# Patient Record
Sex: Male | Born: 1945 | Race: Black or African American | Hispanic: No | Marital: Married | Smoking: Former smoker
Health system: Southern US, Community
[De-identification: ages and names within clinical notes are randomized; demographics above are authoritative.]

## PROBLEM LIST (undated history)

## (undated) DIAGNOSIS — N189 Chronic kidney disease, unspecified: Secondary | ICD-10-CM

## (undated) DIAGNOSIS — I10 Essential (primary) hypertension: Secondary | ICD-10-CM

## (undated) DIAGNOSIS — B1920 Unspecified viral hepatitis C without hepatic coma: Secondary | ICD-10-CM

## (undated) DIAGNOSIS — R0602 Shortness of breath: Secondary | ICD-10-CM

## (undated) DIAGNOSIS — C9000 Multiple myeloma not having achieved remission: Secondary | ICD-10-CM

## (undated) DIAGNOSIS — T7840XA Allergy, unspecified, initial encounter: Secondary | ICD-10-CM

## (undated) HISTORY — PX: HAND SURGERY: SHX662

## (undated) HISTORY — DX: Unspecified viral hepatitis C without hepatic coma: B19.20

## (undated) HISTORY — DX: Essential (primary) hypertension: I10

## (undated) HISTORY — PX: CIRCUMCISION: SUR203

## (undated) HISTORY — PX: OTHER SURGICAL HISTORY: SHX169

## (undated) HISTORY — DX: Allergy, unspecified, initial encounter: T78.40XA

---

## 1999-04-14 ENCOUNTER — Encounter: Admission: RE | Admit: 1999-04-14 | Discharge: 1999-07-13 | Payer: Self-pay | Admitting: Family Medicine

## 1999-12-18 ENCOUNTER — Encounter (INDEPENDENT_AMBULATORY_CARE_PROVIDER_SITE_OTHER): Payer: Self-pay | Admitting: Specialist

## 1999-12-18 ENCOUNTER — Ambulatory Visit (HOSPITAL_COMMUNITY): Admission: RE | Admit: 1999-12-18 | Discharge: 1999-12-18 | Payer: Self-pay | Admitting: Gastroenterology

## 2002-06-01 ENCOUNTER — Encounter: Payer: Self-pay | Admitting: Urology

## 2002-06-03 ENCOUNTER — Encounter (INDEPENDENT_AMBULATORY_CARE_PROVIDER_SITE_OTHER): Payer: Self-pay | Admitting: Specialist

## 2002-06-03 ENCOUNTER — Ambulatory Visit (HOSPITAL_COMMUNITY): Admission: RE | Admit: 2002-06-03 | Discharge: 2002-06-03 | Payer: Self-pay | Admitting: Urology

## 2003-08-01 ENCOUNTER — Inpatient Hospital Stay (HOSPITAL_COMMUNITY): Admission: EM | Admit: 2003-08-01 | Discharge: 2003-08-06 | Payer: Self-pay | Admitting: Emergency Medicine

## 2006-08-31 ENCOUNTER — Ambulatory Visit: Payer: Self-pay | Admitting: Gastroenterology

## 2006-09-30 ENCOUNTER — Encounter (INDEPENDENT_AMBULATORY_CARE_PROVIDER_SITE_OTHER): Payer: Self-pay | Admitting: Physician Assistant

## 2006-09-30 ENCOUNTER — Ambulatory Visit (HOSPITAL_COMMUNITY): Admission: RE | Admit: 2006-09-30 | Discharge: 2006-09-30 | Payer: Self-pay | Admitting: Gastroenterology

## 2007-12-29 ENCOUNTER — Encounter: Admission: RE | Admit: 2007-12-29 | Discharge: 2007-12-29 | Payer: Self-pay | Admitting: Internal Medicine

## 2007-12-31 ENCOUNTER — Encounter: Admission: RE | Admit: 2007-12-31 | Discharge: 2007-12-31 | Payer: Self-pay | Admitting: Internal Medicine

## 2008-01-04 ENCOUNTER — Ambulatory Visit: Payer: Self-pay | Admitting: Internal Medicine

## 2008-01-11 LAB — CBC WITH DIFFERENTIAL/PLATELET
BASO%: 0.8 % (ref 0.0–2.0)
LYMPH%: 47 % (ref 14.0–48.0)
MCHC: 34.5 g/dL (ref 32.0–35.9)
MCV: 98.9 fL — ABNORMAL HIGH (ref 81.6–98.0)
MONO%: 12.6 % (ref 0.0–13.0)
Platelets: 223 10*3/uL (ref 145–400)
RBC: 3.86 10*6/uL — ABNORMAL LOW (ref 4.20–5.71)

## 2008-01-13 ENCOUNTER — Inpatient Hospital Stay (HOSPITAL_COMMUNITY): Admission: EM | Admit: 2008-01-13 | Discharge: 2008-01-18 | Payer: Self-pay | Admitting: Emergency Medicine

## 2008-01-13 LAB — IMMUNOFIXATION ELECTROPHORESIS
IgG (Immunoglobin G), Serum: 1180 mg/dL (ref 694–1618)
Total Protein, Serum Electrophoresis: 10.5 g/dL — ABNORMAL HIGH (ref 6.0–8.3)

## 2008-01-13 LAB — KAPPA/LAMBDA LIGHT CHAINS
Kappa:Lambda Ratio: 0.01 — ABNORMAL LOW (ref 0.26–1.65)
Lambda Free Lght Chn: 234 mg/dL — ABNORMAL HIGH (ref 0.57–2.63)

## 2008-01-13 LAB — IRON AND TIBC: Iron: 93 ug/dL (ref 42–165)

## 2008-01-13 LAB — FERRITIN: Ferritin: 1519 ng/mL — ABNORMAL HIGH (ref 22–322)

## 2008-01-14 ENCOUNTER — Ambulatory Visit: Payer: Self-pay | Admitting: Hematology and Oncology

## 2008-01-17 ENCOUNTER — Encounter: Payer: Self-pay | Admitting: Internal Medicine

## 2008-01-26 ENCOUNTER — Ambulatory Visit: Admission: RE | Admit: 2008-01-26 | Discharge: 2008-03-12 | Payer: Self-pay | Admitting: Radiation Oncology

## 2008-01-30 LAB — TECHNOLOGIST REVIEW

## 2008-01-30 LAB — CBC WITH DIFFERENTIAL/PLATELET
Basophils Absolute: 0 10*3/uL (ref 0.0–0.1)
EOS%: 0.2 % (ref 0.0–7.0)
HGB: 12.1 g/dL — ABNORMAL LOW (ref 13.0–17.1)
MCH: 34.3 pg — ABNORMAL HIGH (ref 28.0–33.4)
NEUT#: 5.8 10*3/uL (ref 1.5–6.5)
RBC: 3.54 10*6/uL — ABNORMAL LOW (ref 4.20–5.71)
RDW: 15.7 % — ABNORMAL HIGH (ref 11.2–14.6)
lymph#: 0.8 10*3/uL — ABNORMAL LOW (ref 0.9–3.3)

## 2008-02-03 ENCOUNTER — Emergency Department (HOSPITAL_COMMUNITY): Admission: EM | Admit: 2008-02-03 | Discharge: 2008-02-04 | Payer: Self-pay | Admitting: Emergency Medicine

## 2008-02-06 LAB — CBC WITH DIFFERENTIAL/PLATELET
BASO%: 0.7 % (ref 0.0–2.0)
Eosinophils Absolute: 0.1 10*3/uL (ref 0.0–0.5)
LYMPH%: 32.9 % (ref 14.0–48.0)
MCHC: 34.7 g/dL (ref 32.0–35.9)
MONO#: 0.1 10*3/uL (ref 0.1–0.9)
NEUT#: 1.3 10*3/uL — ABNORMAL LOW (ref 1.5–6.5)
Platelets: 88 10*3/uL — ABNORMAL LOW (ref 145–400)
RBC: 3.51 10*6/uL — ABNORMAL LOW (ref 4.20–5.71)
RDW: 15.7 % — ABNORMAL HIGH (ref 11.2–14.6)
WBC: 2.4 10*3/uL — ABNORMAL LOW (ref 4.0–10.0)
lymph#: 0.8 10*3/uL — ABNORMAL LOW (ref 0.9–3.3)

## 2008-02-06 LAB — COMPREHENSIVE METABOLIC PANEL
ALT: 121 U/L — ABNORMAL HIGH (ref 0–53)
Albumin: 2.1 g/dL — ABNORMAL LOW (ref 3.5–5.2)
CO2: 17 mEq/L — ABNORMAL LOW (ref 19–32)
Potassium: 4.1 mEq/L (ref 3.5–5.3)
Sodium: 136 mEq/L (ref 135–145)
Total Bilirubin: 0.6 mg/dL (ref 0.3–1.2)
Total Protein: 8.2 g/dL (ref 6.0–8.3)

## 2008-02-06 LAB — LACTATE DEHYDROGENASE: LDH: 130 U/L (ref 94–250)

## 2008-02-13 LAB — CBC WITH DIFFERENTIAL/PLATELET
Basophils Absolute: 0 10*3/uL (ref 0.0–0.1)
EOS%: 5.6 % (ref 0.0–7.0)
HCT: 36.4 % — ABNORMAL LOW (ref 38.7–49.9)
HGB: 12.6 g/dL — ABNORMAL LOW (ref 13.0–17.1)
MCH: 34.5 pg — ABNORMAL HIGH (ref 28.0–33.4)
MCHC: 34.7 g/dL (ref 32.0–35.9)
MCV: 99.3 fL — ABNORMAL HIGH (ref 81.6–98.0)
MONO%: 8.3 % (ref 0.0–13.0)
NEUT%: 27.9 % — ABNORMAL LOW (ref 40.0–75.0)
RDW: 15.6 % — ABNORMAL HIGH (ref 11.2–14.6)

## 2008-02-13 LAB — COMPREHENSIVE METABOLIC PANEL
AST: 27 U/L (ref 0–37)
Alkaline Phosphatase: 162 U/L — ABNORMAL HIGH (ref 39–117)
BUN: 16 mg/dL (ref 6–23)
Creatinine, Ser: 1.17 mg/dL (ref 0.40–1.50)

## 2008-02-15 ENCOUNTER — Ambulatory Visit: Payer: Self-pay | Admitting: Internal Medicine

## 2008-02-20 LAB — COMPREHENSIVE METABOLIC PANEL
ALT: 44 U/L (ref 0–53)
BUN: 17 mg/dL (ref 6–23)
CO2: 23 mEq/L (ref 19–32)
Calcium: 8.2 mg/dL — ABNORMAL LOW (ref 8.4–10.5)
Chloride: 102 mEq/L (ref 96–112)
Creatinine, Ser: 1.31 mg/dL (ref 0.40–1.50)
Glucose, Bld: 149 mg/dL — ABNORMAL HIGH (ref 70–99)

## 2008-02-20 LAB — CBC WITH DIFFERENTIAL/PLATELET
Basophils Absolute: 0 10*3/uL (ref 0.0–0.1)
HCT: 36.7 % — ABNORMAL LOW (ref 38.7–49.9)
HGB: 12.8 g/dL — ABNORMAL LOW (ref 13.0–17.1)
MONO#: 0.3 10*3/uL (ref 0.1–0.9)
NEUT%: 40.1 % (ref 40.0–75.0)
WBC: 2 10*3/uL — ABNORMAL LOW (ref 4.0–10.0)
lymph#: 0.9 10*3/uL (ref 0.9–3.3)

## 2008-02-20 LAB — LACTATE DEHYDROGENASE: LDH: 129 U/L (ref 94–250)

## 2008-02-23 LAB — COMPREHENSIVE METABOLIC PANEL
ALT: 37 U/L (ref 0–53)
AST: 28 U/L (ref 0–37)
Calcium: 8.4 mg/dL (ref 8.4–10.5)
Chloride: 102 mEq/L (ref 96–112)
Creatinine, Ser: 1.35 mg/dL (ref 0.40–1.50)
Potassium: 4.3 mEq/L (ref 3.5–5.3)

## 2008-02-23 LAB — URINALYSIS, MICROSCOPIC - CHCC
Nitrite: NEGATIVE
Protein: 2000 mg/dL
Specific Gravity, Urine: 1.03 (ref 1.003–1.035)

## 2008-02-25 LAB — URINE CULTURE

## 2008-03-01 ENCOUNTER — Ambulatory Visit (HOSPITAL_COMMUNITY): Admission: RE | Admit: 2008-03-01 | Discharge: 2008-03-01 | Payer: Self-pay | Admitting: Radiation Oncology

## 2008-03-08 LAB — CBC WITH DIFFERENTIAL/PLATELET
Basophils Absolute: 0 10*3/uL (ref 0.0–0.1)
Eosinophils Absolute: 0 10*3/uL (ref 0.0–0.5)
HGB: 11.7 g/dL — ABNORMAL LOW (ref 13.0–17.1)
MONO#: 0.4 10*3/uL (ref 0.1–0.9)
NEUT#: 2.9 10*3/uL (ref 1.5–6.5)
RDW: 16.6 % — ABNORMAL HIGH (ref 11.2–14.6)
WBC: 3.9 10*3/uL — ABNORMAL LOW (ref 4.0–10.0)
lymph#: 0.4 10*3/uL — ABNORMAL LOW (ref 0.9–3.3)

## 2008-03-08 LAB — COMPREHENSIVE METABOLIC PANEL
Albumin: 2.5 g/dL — ABNORMAL LOW (ref 3.5–5.2)
BUN: 24 mg/dL — ABNORMAL HIGH (ref 6–23)
Calcium: 8 mg/dL — ABNORMAL LOW (ref 8.4–10.5)
Chloride: 102 mEq/L (ref 96–112)
Glucose, Bld: 220 mg/dL — ABNORMAL HIGH (ref 70–99)
Potassium: 3.8 mEq/L (ref 3.5–5.3)
Total Protein: 7.6 g/dL (ref 6.0–8.3)

## 2008-03-26 LAB — CBC WITH DIFFERENTIAL/PLATELET
Basophils Absolute: 0 10*3/uL (ref 0.0–0.1)
EOS%: 1.4 % (ref 0.0–7.0)
Eosinophils Absolute: 0 10*3/uL (ref 0.0–0.5)
HGB: 12 g/dL — ABNORMAL LOW (ref 13.0–17.1)
MCH: 34 pg — ABNORMAL HIGH (ref 28.0–33.4)
MONO#: 0.3 10*3/uL (ref 0.1–0.9)
NEUT#: 0.8 10*3/uL — ABNORMAL LOW (ref 1.5–6.5)
RDW: 18 % — ABNORMAL HIGH (ref 11.2–14.6)
WBC: 1.8 10*3/uL — ABNORMAL LOW (ref 4.0–10.0)
lymph#: 0.7 10*3/uL — ABNORMAL LOW (ref 0.9–3.3)

## 2008-03-29 ENCOUNTER — Encounter: Admission: RE | Admit: 2008-03-29 | Discharge: 2008-03-29 | Payer: Self-pay | Admitting: Internal Medicine

## 2008-03-29 ENCOUNTER — Encounter: Payer: Self-pay | Admitting: Internal Medicine

## 2008-04-02 ENCOUNTER — Ambulatory Visit: Payer: Self-pay | Admitting: Internal Medicine

## 2008-04-02 LAB — CBC WITH DIFFERENTIAL/PLATELET
BASO%: 1.7 % (ref 0.0–2.0)
EOS%: 1.7 % (ref 0.0–7.0)
LYMPH%: 58.6 % — ABNORMAL HIGH (ref 14.0–48.0)
MCHC: 35.2 g/dL (ref 32.0–35.9)
MCV: 99.2 fL — ABNORMAL HIGH (ref 81.6–98.0)
MONO%: 7.9 % (ref 0.0–13.0)
NEUT#: 0.5 10*3/uL — ABNORMAL LOW (ref 1.5–6.5)
Platelets: 143 10*3/uL — ABNORMAL LOW (ref 145–400)
RBC: 3.66 10*6/uL — ABNORMAL LOW (ref 4.20–5.71)
RDW: 18.8 % — ABNORMAL HIGH (ref 11.2–14.6)

## 2008-04-02 LAB — COMPREHENSIVE METABOLIC PANEL
ALT: 86 U/L — ABNORMAL HIGH (ref 0–53)
AST: 56 U/L — ABNORMAL HIGH (ref 0–37)
Albumin: 2.6 g/dL — ABNORMAL LOW (ref 3.5–5.2)
Alkaline Phosphatase: 158 U/L — ABNORMAL HIGH (ref 39–117)
Potassium: 4.4 mEq/L (ref 3.5–5.3)
Sodium: 138 mEq/L (ref 135–145)
Total Bilirubin: 0.5 mg/dL (ref 0.3–1.2)
Total Protein: 8 g/dL (ref 6.0–8.3)

## 2008-04-05 ENCOUNTER — Encounter (INDEPENDENT_AMBULATORY_CARE_PROVIDER_SITE_OTHER): Payer: Self-pay | Admitting: Interventional Radiology

## 2008-04-05 ENCOUNTER — Ambulatory Visit (HOSPITAL_COMMUNITY): Admission: RE | Admit: 2008-04-05 | Discharge: 2008-04-05 | Payer: Self-pay | Admitting: Interventional Radiology

## 2008-04-09 LAB — CBC WITH DIFFERENTIAL/PLATELET
EOS%: 3.4 % (ref 0.0–7.0)
Eosinophils Absolute: 0 10*3/uL (ref 0.0–0.5)
LYMPH%: 44.7 % (ref 14.0–48.0)
MCH: 34.3 pg — ABNORMAL HIGH (ref 28.0–33.4)
MCHC: 34.6 g/dL (ref 32.0–35.9)
MCV: 99.3 fL — ABNORMAL HIGH (ref 81.6–98.0)
MONO%: 9 % (ref 0.0–13.0)
NEUT#: 0.6 10*3/uL — ABNORMAL LOW (ref 1.5–6.5)
Platelets: 174 10*3/uL (ref 145–400)
RBC: 3.45 10*6/uL — ABNORMAL LOW (ref 4.20–5.71)
RDW: 18.3 % — ABNORMAL HIGH (ref 11.2–14.6)

## 2008-04-09 LAB — COMPREHENSIVE METABOLIC PANEL
AST: 88 U/L — ABNORMAL HIGH (ref 0–37)
Alkaline Phosphatase: 176 U/L — ABNORMAL HIGH (ref 39–117)
BUN: 11 mg/dL (ref 6–23)
Glucose, Bld: 124 mg/dL — ABNORMAL HIGH (ref 70–99)
Sodium: 141 mEq/L (ref 135–145)
Total Bilirubin: 0.8 mg/dL (ref 0.3–1.2)
Total Protein: 7.6 g/dL (ref 6.0–8.3)

## 2008-04-16 LAB — CBC WITH DIFFERENTIAL/PLATELET
Basophils Absolute: 0 10*3/uL (ref 0.0–0.1)
Eosinophils Absolute: 0 10*3/uL (ref 0.0–0.5)
HGB: 11.8 g/dL — ABNORMAL LOW (ref 13.0–17.1)
LYMPH%: 50.9 % — ABNORMAL HIGH (ref 14.0–49.0)
MCH: 34.6 pg — ABNORMAL HIGH (ref 27.2–33.4)
MCV: 100.1 fL — ABNORMAL HIGH (ref 79.3–98.0)
MONO%: 9.8 % (ref 0.0–14.0)
NEUT#: 0.6 10*3/uL — ABNORMAL LOW (ref 1.5–6.5)
Platelets: 183 10*3/uL (ref 140–400)

## 2008-04-16 LAB — COMPREHENSIVE METABOLIC PANEL
Alkaline Phosphatase: 174 U/L — ABNORMAL HIGH (ref 39–117)
BUN: 16 mg/dL (ref 6–23)
Creatinine, Ser: 1.01 mg/dL (ref 0.40–1.50)
Glucose, Bld: 142 mg/dL — ABNORMAL HIGH (ref 70–99)
Total Bilirubin: 0.7 mg/dL (ref 0.3–1.2)

## 2008-04-23 LAB — CBC WITH DIFFERENTIAL/PLATELET
Eosinophils Absolute: 0 10*3/uL (ref 0.0–0.5)
HCT: 33.8 % — ABNORMAL LOW (ref 38.4–49.9)
LYMPH%: 37.1 % (ref 14.0–49.0)
MONO#: 0.3 10*3/uL (ref 0.1–0.9)
NEUT#: 0.7 10*3/uL — ABNORMAL LOW (ref 1.5–6.5)
NEUT%: 44.3 % (ref 39.0–75.0)
Platelets: 179 10*3/uL (ref 140–400)
WBC: 1.5 10*3/uL — ABNORMAL LOW (ref 4.0–10.3)

## 2008-04-23 LAB — COMPREHENSIVE METABOLIC PANEL
CO2: 26 mEq/L (ref 19–32)
Calcium: 8.7 mg/dL (ref 8.4–10.5)
Chloride: 104 mEq/L (ref 96–112)
Glucose, Bld: 99 mg/dL (ref 70–99)
Sodium: 139 mEq/L (ref 135–145)
Total Bilirubin: 0.9 mg/dL (ref 0.3–1.2)
Total Protein: 7.6 g/dL (ref 6.0–8.3)

## 2008-04-23 LAB — LACTATE DEHYDROGENASE: LDH: 191 U/L (ref 94–250)

## 2008-04-30 LAB — CBC WITH DIFFERENTIAL/PLATELET
BASO%: 1.1 % (ref 0.0–2.0)
EOS%: 0.5 % (ref 0.0–7.0)
HCT: 36.7 % — ABNORMAL LOW (ref 38.4–49.9)
MCHC: 33.2 g/dL (ref 32.0–36.0)
MONO#: 0.4 10*3/uL (ref 0.1–0.9)
NEUT%: 43.8 % (ref 39.0–75.0)
RBC: 3.71 10*6/uL — ABNORMAL LOW (ref 4.20–5.82)
RDW: 17.7 % — ABNORMAL HIGH (ref 11.0–14.6)
WBC: 1.9 10*3/uL — ABNORMAL LOW (ref 4.0–10.3)
lymph#: 0.6 10*3/uL — ABNORMAL LOW (ref 0.9–3.3)
nRBC: 0 % (ref 0–0)

## 2008-04-30 LAB — COMPREHENSIVE METABOLIC PANEL
ALT: 74 U/L — ABNORMAL HIGH (ref 0–53)
AST: 50 U/L — ABNORMAL HIGH (ref 0–37)
Albumin: 2.5 g/dL — ABNORMAL LOW (ref 3.5–5.2)
Alkaline Phosphatase: 131 U/L — ABNORMAL HIGH (ref 39–117)
Calcium: 7.3 mg/dL — ABNORMAL LOW (ref 8.4–10.5)
Chloride: 104 mEq/L (ref 96–112)
Potassium: 4.1 mEq/L (ref 3.5–5.3)
Sodium: 137 mEq/L (ref 135–145)
Total Protein: 7 g/dL (ref 6.0–8.3)

## 2008-05-07 LAB — CBC WITH DIFFERENTIAL/PLATELET
BASO%: 0.7 % (ref 0.0–2.0)
EOS%: 1.2 % (ref 0.0–7.0)
Eosinophils Absolute: 0.1 10*3/uL (ref 0.0–0.5)
LYMPH%: 16.9 % (ref 14.0–49.0)
MCH: 34.9 pg — ABNORMAL HIGH (ref 27.2–33.4)
MCHC: 34.3 g/dL (ref 32.0–36.0)
MCV: 101.7 fL — ABNORMAL HIGH (ref 79.3–98.0)
MONO%: 1.9 % (ref 0.0–14.0)
NEUT#: 4.1 10*3/uL (ref 1.5–6.5)
Platelets: 165 10*3/uL (ref 140–400)
RBC: 3.68 10*6/uL — ABNORMAL LOW (ref 4.20–5.82)

## 2008-05-07 LAB — COMPREHENSIVE METABOLIC PANEL
ALT: 89 U/L — ABNORMAL HIGH (ref 0–53)
Alkaline Phosphatase: 167 U/L — ABNORMAL HIGH (ref 39–117)
BUN: 12 mg/dL (ref 6–23)
Glucose, Bld: 145 mg/dL — ABNORMAL HIGH (ref 70–99)
Sodium: 140 mEq/L (ref 135–145)
Total Bilirubin: 0.7 mg/dL (ref 0.3–1.2)
Total Protein: 7.4 g/dL (ref 6.0–8.3)

## 2008-05-18 ENCOUNTER — Ambulatory Visit: Payer: Self-pay | Admitting: Internal Medicine

## 2008-05-23 LAB — CBC WITH DIFFERENTIAL/PLATELET
BASO%: 0.7 % (ref 0.0–2.0)
Eosinophils Absolute: 0 10*3/uL (ref 0.0–0.5)
HCT: 38.3 % — ABNORMAL LOW (ref 38.4–49.9)
LYMPH%: 40 % (ref 14.0–49.0)
MCHC: 34 g/dL (ref 32.0–36.0)
MONO#: 0.1 10*3/uL (ref 0.1–0.9)
NEUT%: 53.9 % (ref 39.0–75.0)
Platelets: 86 10*3/uL — ABNORMAL LOW (ref 140–400)
WBC: 1.2 10*3/uL — ABNORMAL LOW (ref 4.0–10.3)

## 2008-05-24 LAB — COMPREHENSIVE METABOLIC PANEL
BUN: 26 mg/dL — ABNORMAL HIGH (ref 6–23)
CO2: 19 mEq/L (ref 19–32)
Creatinine, Ser: 1.19 mg/dL (ref 0.40–1.50)
Glucose, Bld: 219 mg/dL — ABNORMAL HIGH (ref 70–99)
Total Bilirubin: 0.6 mg/dL (ref 0.3–1.2)

## 2008-05-24 LAB — KAPPA/LAMBDA LIGHT CHAINS
Kappa:Lambda Ratio: 0.04 — ABNORMAL LOW (ref 0.26–1.65)
Lambda Free Lght Chn: 49.7 mg/dL — ABNORMAL HIGH (ref 0.57–2.63)

## 2008-05-28 LAB — CBC WITH DIFFERENTIAL/PLATELET
Basophils Absolute: 0 10*3/uL (ref 0.0–0.1)
Eosinophils Absolute: 0 10*3/uL (ref 0.0–0.5)
HCT: 32.6 % — ABNORMAL LOW (ref 38.4–49.9)
HGB: 11.4 g/dL — ABNORMAL LOW (ref 13.0–17.1)
LYMPH%: 64.6 % — ABNORMAL HIGH (ref 14.0–49.0)
MCHC: 35 g/dL (ref 32.0–36.0)
MONO#: 0.3 10*3/uL (ref 0.1–0.9)
NEUT#: 0.1 10*3/uL — CL (ref 1.5–6.5)
NEUT%: 9.7 % — ABNORMAL LOW (ref 39.0–75.0)
Platelets: 63 10*3/uL — ABNORMAL LOW (ref 140–400)
WBC: 1.1 10*3/uL — ABNORMAL LOW (ref 4.0–10.3)

## 2008-05-28 LAB — COMPREHENSIVE METABOLIC PANEL
Albumin: 2.4 g/dL — ABNORMAL LOW (ref 3.5–5.2)
Alkaline Phosphatase: 121 U/L — ABNORMAL HIGH (ref 39–117)
BUN: 16 mg/dL (ref 6–23)
CO2: 23 mEq/L (ref 19–32)
Glucose, Bld: 131 mg/dL — ABNORMAL HIGH (ref 70–99)
Potassium: 4 mEq/L (ref 3.5–5.3)
Sodium: 137 mEq/L (ref 135–145)
Total Bilirubin: 0.6 mg/dL (ref 0.3–1.2)
Total Protein: 7.4 g/dL (ref 6.0–8.3)

## 2008-05-28 LAB — LACTATE DEHYDROGENASE: LDH: 138 U/L (ref 94–250)

## 2008-06-04 LAB — CBC WITH DIFFERENTIAL/PLATELET
BASO%: 0.7 % (ref 0.0–2.0)
Eosinophils Absolute: 0 10*3/uL (ref 0.0–0.5)
MCHC: 34.2 g/dL (ref 32.0–36.0)
MONO#: 0.3 10*3/uL (ref 0.1–0.9)
NEUT#: 3 10*3/uL (ref 1.5–6.5)
Platelets: 91 10*3/uL — ABNORMAL LOW (ref 140–400)
RBC: 3.39 10*6/uL — ABNORMAL LOW (ref 4.20–5.82)
RDW: 17.6 % — ABNORMAL HIGH (ref 11.0–14.6)
WBC: 4.7 10*3/uL (ref 4.0–10.3)
lymph#: 1.3 10*3/uL (ref 0.9–3.3)

## 2008-06-04 LAB — COMPREHENSIVE METABOLIC PANEL
ALT: 71 U/L — ABNORMAL HIGH (ref 0–53)
Albumin: 2.5 g/dL — ABNORMAL LOW (ref 3.5–5.2)
CO2: 23 mEq/L (ref 19–32)
Calcium: 8.3 mg/dL — ABNORMAL LOW (ref 8.4–10.5)
Chloride: 110 mEq/L (ref 96–112)
Glucose, Bld: 76 mg/dL (ref 70–99)
Potassium: 4 mEq/L (ref 3.5–5.3)
Sodium: 139 mEq/L (ref 135–145)
Total Bilirubin: 0.5 mg/dL (ref 0.3–1.2)
Total Protein: 8 g/dL (ref 6.0–8.3)

## 2008-06-04 LAB — LACTATE DEHYDROGENASE: LDH: 141 U/L (ref 94–250)

## 2008-06-11 LAB — CBC WITH DIFFERENTIAL/PLATELET
BASO%: 0 % (ref 0.0–2.0)
HCT: 33.4 % — ABNORMAL LOW (ref 38.4–49.9)
LYMPH%: 42.4 % (ref 14.0–49.0)
MCH: 33.9 pg — ABNORMAL HIGH (ref 27.2–33.4)
MCHC: 34.7 g/dL (ref 32.0–36.0)
MCV: 97.7 fL (ref 79.3–98.0)
MONO#: 0.2 10*3/uL (ref 0.1–0.9)
MONO%: 10.4 % (ref 0.0–14.0)
NEUT%: 44.4 % (ref 39.0–75.0)
Platelets: 101 10*3/uL — ABNORMAL LOW (ref 140–400)
WBC: 1.4 10*3/uL — ABNORMAL LOW (ref 4.0–10.3)

## 2008-06-11 LAB — COMPREHENSIVE METABOLIC PANEL
AST: 83 U/L — ABNORMAL HIGH (ref 0–37)
BUN: 11 mg/dL (ref 6–23)
Calcium: 8.2 mg/dL — ABNORMAL LOW (ref 8.4–10.5)
Chloride: 104 mEq/L (ref 96–112)
Creatinine, Ser: 0.89 mg/dL (ref 0.40–1.50)

## 2008-06-11 LAB — LACTATE DEHYDROGENASE: LDH: 166 U/L (ref 94–250)

## 2008-06-18 LAB — COMPREHENSIVE METABOLIC PANEL
Albumin: 3.1 g/dL — ABNORMAL LOW (ref 3.5–5.2)
BUN: 12 mg/dL (ref 6–23)
CO2: 23 mEq/L (ref 19–32)
Calcium: 8.1 mg/dL — ABNORMAL LOW (ref 8.4–10.5)
Chloride: 105 mEq/L (ref 96–112)
Glucose, Bld: 72 mg/dL (ref 70–99)
Potassium: 3.5 mEq/L (ref 3.5–5.3)

## 2008-06-18 LAB — CBC WITH DIFFERENTIAL/PLATELET
Basophils Absolute: 0 10*3/uL (ref 0.0–0.1)
Eosinophils Absolute: 0 10*3/uL (ref 0.0–0.5)
HGB: 12.5 g/dL — ABNORMAL LOW (ref 13.0–17.1)
MCV: 101.3 fL — ABNORMAL HIGH (ref 79.3–98.0)
NEUT#: 0.4 10*3/uL — CL (ref 1.5–6.5)
RDW: 16.4 % — ABNORMAL HIGH (ref 11.0–14.6)
lymph#: 0.8 10*3/uL — ABNORMAL LOW (ref 0.9–3.3)

## 2008-07-02 ENCOUNTER — Ambulatory Visit: Payer: Self-pay | Admitting: Internal Medicine

## 2008-07-02 LAB — BASIC METABOLIC PANEL
BUN: 12 mg/dL (ref 6–23)
CO2: 22 mEq/L (ref 19–32)
Calcium: 8.1 mg/dL — ABNORMAL LOW (ref 8.4–10.5)
Creatinine, Ser: 0.96 mg/dL (ref 0.40–1.50)
Glucose, Bld: 103 mg/dL — ABNORMAL HIGH (ref 70–99)

## 2008-07-09 LAB — COMPREHENSIVE METABOLIC PANEL
AST: 59 U/L — ABNORMAL HIGH (ref 0–37)
Albumin: 3 g/dL — ABNORMAL LOW (ref 3.5–5.2)
Alkaline Phosphatase: 124 U/L — ABNORMAL HIGH (ref 39–117)
Potassium: 3.9 mEq/L (ref 3.5–5.3)
Sodium: 140 mEq/L (ref 135–145)
Total Protein: 7.7 g/dL (ref 6.0–8.3)

## 2008-07-09 LAB — CBC WITH DIFFERENTIAL/PLATELET
BASO%: 1.1 % (ref 0.0–2.0)
EOS%: 5.1 % (ref 0.0–7.0)
MCH: 34.8 pg — ABNORMAL HIGH (ref 27.2–33.4)
MCV: 102.3 fL — ABNORMAL HIGH (ref 79.3–98.0)
MONO%: 13.1 % (ref 0.0–14.0)
NEUT#: 0.9 10*3/uL — ABNORMAL LOW (ref 1.5–6.5)
RBC: 3.72 10*6/uL — ABNORMAL LOW (ref 4.20–5.82)
RDW: 17.4 % — ABNORMAL HIGH (ref 11.0–14.6)

## 2008-07-16 LAB — CBC WITH DIFFERENTIAL/PLATELET
Basophils Absolute: 0 10*3/uL (ref 0.0–0.1)
EOS%: 8.5 % — ABNORMAL HIGH (ref 0.0–7.0)
MCH: 34 pg — ABNORMAL HIGH (ref 27.2–33.4)
MCV: 98.3 fL — ABNORMAL HIGH (ref 79.3–98.0)
MONO%: 18.6 % — ABNORMAL HIGH (ref 0.0–14.0)
RBC: 3.5 10*6/uL — ABNORMAL LOW (ref 4.20–5.82)
RDW: 15.5 % — ABNORMAL HIGH (ref 11.0–14.6)

## 2008-07-16 LAB — COMPREHENSIVE METABOLIC PANEL
Alkaline Phosphatase: 121 U/L — ABNORMAL HIGH (ref 39–117)
Creatinine, Ser: 0.9 mg/dL (ref 0.40–1.50)
Glucose, Bld: 104 mg/dL — ABNORMAL HIGH (ref 70–99)
Sodium: 139 mEq/L (ref 135–145)
Total Bilirubin: 0.7 mg/dL (ref 0.3–1.2)
Total Protein: 7.1 g/dL (ref 6.0–8.3)

## 2008-07-27 ENCOUNTER — Ambulatory Visit: Payer: Self-pay | Admitting: Internal Medicine

## 2008-07-27 ENCOUNTER — Encounter: Payer: Self-pay | Admitting: Internal Medicine

## 2008-07-27 ENCOUNTER — Other Ambulatory Visit: Admission: RE | Admit: 2008-07-27 | Discharge: 2008-07-27 | Payer: Self-pay | Admitting: Internal Medicine

## 2008-07-27 LAB — COMPREHENSIVE METABOLIC PANEL
ALT: 64 U/L — ABNORMAL HIGH (ref 0–53)
BUN: 18 mg/dL (ref 6–23)
CO2: 21 mEq/L (ref 19–32)
Calcium: 7.8 mg/dL — ABNORMAL LOW (ref 8.4–10.5)
Creatinine, Ser: 1.06 mg/dL (ref 0.40–1.50)
Total Bilirubin: 0.5 mg/dL (ref 0.3–1.2)

## 2008-07-27 LAB — CBC WITH DIFFERENTIAL/PLATELET
BASO%: 0.7 % (ref 0.0–2.0)
Basophils Absolute: 0 10*3/uL (ref 0.0–0.1)
HCT: 35.2 % — ABNORMAL LOW (ref 38.4–49.9)
HGB: 12 g/dL — ABNORMAL LOW (ref 13.0–17.1)
LYMPH%: 48.7 % (ref 14.0–49.0)
MCH: 33.7 pg — ABNORMAL HIGH (ref 27.2–33.4)
MCHC: 34.1 g/dL (ref 32.0–36.0)
MONO#: 0.3 10*3/uL (ref 0.1–0.9)
NEUT%: 25.3 % — ABNORMAL LOW (ref 39.0–75.0)
Platelets: 116 10*3/uL — ABNORMAL LOW (ref 140–400)

## 2008-07-30 LAB — IGG, IGA, IGM
IgA: 1610 mg/dL — ABNORMAL HIGH (ref 68–378)
IgG (Immunoglobin G), Serum: 935 mg/dL (ref 694–1618)
IgM, Serum: 27 mg/dL — ABNORMAL LOW (ref 60–263)

## 2008-07-30 LAB — BETA 2 MICROGLOBULIN, SERUM: Beta-2 Microglobulin: 3.24 mg/L — ABNORMAL HIGH (ref 1.01–1.73)

## 2008-08-02 LAB — CBC WITH DIFFERENTIAL/PLATELET
Basophils Absolute: 0 10*3/uL (ref 0.0–0.1)
Eosinophils Absolute: 0 10*3/uL (ref 0.0–0.5)
HCT: 32.8 % — ABNORMAL LOW (ref 38.4–49.9)
HGB: 11.3 g/dL — ABNORMAL LOW (ref 13.0–17.1)
LYMPH%: 17.9 % (ref 14.0–49.0)
MCHC: 34.5 g/dL (ref 32.0–36.0)
MONO#: 0.5 10*3/uL (ref 0.1–0.9)
NEUT#: 3.8 10*3/uL (ref 1.5–6.5)
NEUT%: 72.5 % (ref 39.0–75.0)
Platelets: 107 10*3/uL — ABNORMAL LOW (ref 140–400)
WBC: 5.2 10*3/uL (ref 4.0–10.3)
lymph#: 0.9 10*3/uL (ref 0.9–3.3)

## 2008-08-02 LAB — COMPREHENSIVE METABOLIC PANEL
ALT: 59 U/L — ABNORMAL HIGH (ref 0–53)
AST: 55 U/L — ABNORMAL HIGH (ref 0–37)
Alkaline Phosphatase: 116 U/L (ref 39–117)
Glucose, Bld: 142 mg/dL — ABNORMAL HIGH (ref 70–99)
Potassium: 3.8 mEq/L (ref 3.5–5.3)
Sodium: 141 mEq/L (ref 135–145)
Total Bilirubin: 0.5 mg/dL (ref 0.3–1.2)
Total Protein: 7 g/dL (ref 6.0–8.3)

## 2008-08-09 LAB — COMPREHENSIVE METABOLIC PANEL
AST: 56 U/L — ABNORMAL HIGH (ref 0–37)
Alkaline Phosphatase: 113 U/L (ref 39–117)
BUN: 21 mg/dL (ref 6–23)
Glucose, Bld: 81 mg/dL (ref 70–99)
Sodium: 140 mEq/L (ref 135–145)
Total Bilirubin: 0.8 mg/dL (ref 0.3–1.2)
Total Protein: 7 g/dL (ref 6.0–8.3)

## 2008-08-09 LAB — CBC WITH DIFFERENTIAL/PLATELET
EOS%: 2.4 % (ref 0.0–7.0)
Eosinophils Absolute: 0 10*3/uL (ref 0.0–0.5)
LYMPH%: 52.1 % — ABNORMAL HIGH (ref 14.0–49.0)
MCH: 33.6 pg — ABNORMAL HIGH (ref 27.2–33.4)
MCV: 98.6 fL — ABNORMAL HIGH (ref 79.3–98.0)
MONO%: 4.2 % (ref 0.0–14.0)
NEUT#: 0.7 10*3/uL — ABNORMAL LOW (ref 1.5–6.5)
Platelets: 78 10*3/uL — ABNORMAL LOW (ref 140–400)
RBC: 3.54 10*6/uL — ABNORMAL LOW (ref 4.20–5.82)

## 2008-08-14 ENCOUNTER — Encounter: Payer: Self-pay | Admitting: Internal Medicine

## 2008-08-14 ENCOUNTER — Ambulatory Visit: Admission: RE | Admit: 2008-08-14 | Discharge: 2008-08-14 | Payer: Self-pay | Admitting: Internal Medicine

## 2008-08-16 LAB — CBC WITH DIFFERENTIAL/PLATELET
Basophils Absolute: 0 10*3/uL (ref 0.0–0.1)
Eosinophils Absolute: 0.1 10*3/uL (ref 0.0–0.5)
HGB: 12.2 g/dL — ABNORMAL LOW (ref 13.0–17.1)
LYMPH%: 42.9 % (ref 14.0–49.0)
MCV: 99.4 fL — ABNORMAL HIGH (ref 79.3–98.0)
MONO%: 16.2 % — ABNORMAL HIGH (ref 0.0–14.0)
NEUT#: 0.5 10*3/uL — ABNORMAL LOW (ref 1.5–6.5)
Platelets: 42 10*3/uL — ABNORMAL LOW (ref 140–400)

## 2008-08-28 ENCOUNTER — Ambulatory Visit: Payer: Self-pay | Admitting: Internal Medicine

## 2008-08-28 LAB — CBC WITH DIFFERENTIAL/PLATELET
BASO%: 0.4 % (ref 0.0–2.0)
Basophils Absolute: 0 10*3/uL (ref 0.0–0.1)
Eosinophils Absolute: 0 10*3/uL (ref 0.0–0.5)
HCT: 32.5 % — ABNORMAL LOW (ref 38.4–49.9)
HGB: 11.4 g/dL — ABNORMAL LOW (ref 13.0–17.1)
MONO#: 0.2 10*3/uL (ref 0.1–0.9)
NEUT#: 1.4 10*3/uL — ABNORMAL LOW (ref 1.5–6.5)
NEUT%: 57.6 % (ref 39.0–75.0)
WBC: 2.5 10*3/uL — ABNORMAL LOW (ref 4.0–10.3)
lymph#: 0.8 10*3/uL — ABNORMAL LOW (ref 0.9–3.3)

## 2008-08-28 LAB — LACTATE DEHYDROGENASE: LDH: 192 U/L (ref 94–250)

## 2008-08-28 LAB — COMPREHENSIVE METABOLIC PANEL
ALT: 58 U/L — ABNORMAL HIGH (ref 0–53)
BUN: 14 mg/dL (ref 6–23)
CO2: 27 mEq/L (ref 19–32)
Calcium: 8.6 mg/dL (ref 8.4–10.5)
Chloride: 107 mEq/L (ref 96–112)
Creatinine, Ser: 1.15 mg/dL (ref 0.40–1.50)

## 2008-09-03 LAB — CBC WITH DIFFERENTIAL/PLATELET
Basophils Absolute: 0 10*3/uL (ref 0.0–0.1)
Eosinophils Absolute: 0 10*3/uL (ref 0.0–0.5)
HGB: 12 g/dL — ABNORMAL LOW (ref 13.0–17.1)
MCV: 97.2 fL (ref 79.3–98.0)
MONO#: 0.3 10*3/uL (ref 0.1–0.9)
NEUT#: 0.5 10*3/uL — ABNORMAL LOW (ref 1.5–6.5)
RDW: 15.2 % — ABNORMAL HIGH (ref 11.0–14.6)
WBC: 1.7 10*3/uL — ABNORMAL LOW (ref 4.0–10.3)
lymph#: 0.8 10*3/uL — ABNORMAL LOW (ref 0.9–3.3)

## 2008-09-04 LAB — COMPREHENSIVE METABOLIC PANEL
ALT: 60 U/L — ABNORMAL HIGH (ref 0–53)
AST: 50 U/L — ABNORMAL HIGH (ref 0–37)
Calcium: 8.5 mg/dL (ref 8.4–10.5)
Chloride: 105 mEq/L (ref 96–112)
Creatinine, Ser: 1.04 mg/dL (ref 0.40–1.50)
Total Bilirubin: 0.5 mg/dL (ref 0.3–1.2)

## 2008-09-10 LAB — COMPREHENSIVE METABOLIC PANEL
Albumin: 2.9 g/dL — ABNORMAL LOW (ref 3.5–5.2)
Alkaline Phosphatase: 139 U/L — ABNORMAL HIGH (ref 39–117)
BUN: 17 mg/dL (ref 6–23)
CO2: 22 mEq/L (ref 19–32)
Glucose, Bld: 228 mg/dL — ABNORMAL HIGH (ref 70–99)
Potassium: 3.6 mEq/L (ref 3.5–5.3)
Total Bilirubin: 0.5 mg/dL (ref 0.3–1.2)

## 2008-09-10 LAB — CBC WITH DIFFERENTIAL/PLATELET
Basophils Absolute: 0 10*3/uL (ref 0.0–0.1)
Eosinophils Absolute: 0 10*3/uL (ref 0.0–0.5)
HCT: 31.7 % — ABNORMAL LOW (ref 38.4–49.9)
HGB: 10.7 g/dL — ABNORMAL LOW (ref 13.0–17.1)
LYMPH%: 29 % (ref 14.0–49.0)
MCV: 100 fL — ABNORMAL HIGH (ref 79.3–98.0)
MONO#: 0.9 10*3/uL (ref 0.1–0.9)
MONO%: 19.6 % — ABNORMAL HIGH (ref 0.0–14.0)
NEUT#: 2.3 10*3/uL (ref 1.5–6.5)
Platelets: 125 10*3/uL — ABNORMAL LOW (ref 140–400)

## 2008-09-17 LAB — CBC WITH DIFFERENTIAL/PLATELET
Basophils Absolute: 0 10*3/uL (ref 0.0–0.1)
Eosinophils Absolute: 0 10*3/uL (ref 0.0–0.5)
HGB: 11.8 g/dL — ABNORMAL LOW (ref 13.0–17.1)
MCV: 99.1 fL — ABNORMAL HIGH (ref 79.3–98.0)
MONO#: 0.3 10*3/uL (ref 0.1–0.9)
MONO%: 18.7 % — ABNORMAL HIGH (ref 0.0–14.0)
NEUT#: 0.5 10*3/uL — ABNORMAL LOW (ref 1.5–6.5)
RBC: 3.51 10*6/uL — ABNORMAL LOW (ref 4.20–5.82)
RDW: 16.2 % — ABNORMAL HIGH (ref 11.0–14.6)
WBC: 1.5 10*3/uL — ABNORMAL LOW (ref 4.0–10.3)

## 2008-09-24 ENCOUNTER — Ambulatory Visit: Payer: Self-pay | Admitting: Internal Medicine

## 2008-09-24 LAB — COMPREHENSIVE METABOLIC PANEL
ALT: 103 U/L — ABNORMAL HIGH (ref 0–53)
Albumin: 2.8 g/dL — ABNORMAL LOW (ref 3.5–5.2)
CO2: 25 mEq/L (ref 19–32)
Calcium: 8.9 mg/dL (ref 8.4–10.5)
Chloride: 110 mEq/L (ref 96–112)
Glucose, Bld: 156 mg/dL — ABNORMAL HIGH (ref 70–99)
Sodium: 141 mEq/L (ref 135–145)
Total Protein: 7.5 g/dL (ref 6.0–8.3)

## 2008-09-24 LAB — CBC WITH DIFFERENTIAL/PLATELET
BASO%: 0.1 % (ref 0.0–2.0)
Eosinophils Absolute: 0 10*3/uL (ref 0.0–0.5)
HCT: 34.9 % — ABNORMAL LOW (ref 38.4–49.9)
LYMPH%: 9 % — ABNORMAL LOW (ref 14.0–49.0)
MCHC: 33.5 g/dL (ref 32.0–36.0)
MONO#: 1.5 10*3/uL — ABNORMAL HIGH (ref 0.1–0.9)
NEUT#: 9.2 10*3/uL — ABNORMAL HIGH (ref 1.5–6.5)
Platelets: 169 10*3/uL (ref 140–400)
RBC: 3.44 10*6/uL — ABNORMAL LOW (ref 4.20–5.82)
WBC: 11.7 10*3/uL — ABNORMAL HIGH (ref 4.0–10.3)
lymph#: 1.1 10*3/uL (ref 0.9–3.3)

## 2008-10-01 LAB — CBC WITH DIFFERENTIAL/PLATELET
BASO%: 0 % (ref 0.0–2.0)
Eosinophils Absolute: 0 10*3/uL (ref 0.0–0.5)
LYMPH%: 25.1 % (ref 14.0–49.0)
MCHC: 33.6 g/dL (ref 32.0–36.0)
MONO#: 0.5 10*3/uL (ref 0.1–0.9)
NEUT#: 1.7 10*3/uL (ref 1.5–6.5)
RBC: 3.44 10*6/uL — ABNORMAL LOW (ref 4.20–5.82)
RDW: 15.5 % — ABNORMAL HIGH (ref 11.0–14.6)
WBC: 2.9 10*3/uL — ABNORMAL LOW (ref 4.0–10.3)
lymph#: 0.7 10*3/uL — ABNORMAL LOW (ref 0.9–3.3)

## 2008-10-01 LAB — COMPREHENSIVE METABOLIC PANEL
ALT: 106 U/L — ABNORMAL HIGH (ref 0–53)
Albumin: 3.3 g/dL — ABNORMAL LOW (ref 3.5–5.2)
CO2: 21 mEq/L (ref 19–32)
Chloride: 109 mEq/L (ref 96–112)
Glucose, Bld: 89 mg/dL (ref 70–99)
Potassium: 3.8 mEq/L (ref 3.5–5.3)
Sodium: 142 mEq/L (ref 135–145)
Total Bilirubin: 0.7 mg/dL (ref 0.3–1.2)
Total Protein: 6.8 g/dL (ref 6.0–8.3)

## 2008-10-01 LAB — LACTATE DEHYDROGENASE: LDH: 229 U/L (ref 94–250)

## 2008-10-08 LAB — COMPREHENSIVE METABOLIC PANEL
ALT: 94 U/L — ABNORMAL HIGH (ref 0–53)
AST: 79 U/L — ABNORMAL HIGH (ref 0–37)
Creatinine, Ser: 0.96 mg/dL (ref 0.40–1.50)
Sodium: 142 mEq/L (ref 135–145)
Total Bilirubin: 0.4 mg/dL (ref 0.3–1.2)

## 2008-10-08 LAB — CBC WITH DIFFERENTIAL/PLATELET
BASO%: 0.1 % (ref 0.0–2.0)
EOS%: 0.2 % (ref 0.0–7.0)
HCT: 37.1 % — ABNORMAL LOW (ref 38.4–49.9)
LYMPH%: 7.4 % — ABNORMAL LOW (ref 14.0–49.0)
MCH: 34.1 pg — ABNORMAL HIGH (ref 27.2–33.4)
MCHC: 34 g/dL (ref 32.0–36.0)
NEUT%: 84.5 % — ABNORMAL HIGH (ref 39.0–75.0)
RBC: 3.7 10*6/uL — ABNORMAL LOW (ref 4.20–5.82)
lymph#: 1 10*3/uL (ref 0.9–3.3)

## 2008-10-15 LAB — CBC WITH DIFFERENTIAL/PLATELET
Eosinophils Absolute: 0 10*3/uL (ref 0.0–0.5)
HCT: 38.6 % (ref 38.4–49.9)
LYMPH%: 28.4 % (ref 14.0–49.0)
MCHC: 33.9 g/dL (ref 32.0–36.0)
MCV: 99.2 fL — ABNORMAL HIGH (ref 79.3–98.0)
MONO#: 0.3 10*3/uL (ref 0.1–0.9)
MONO%: 14.4 % — ABNORMAL HIGH (ref 0.0–14.0)
NEUT#: 1.2 10*3/uL — ABNORMAL LOW (ref 1.5–6.5)
NEUT%: 55.3 % (ref 39.0–75.0)
Platelets: 141 10*3/uL (ref 140–400)
WBC: 2.1 10*3/uL — ABNORMAL LOW (ref 4.0–10.3)

## 2008-10-15 LAB — COMPREHENSIVE METABOLIC PANEL
AST: 55 U/L — ABNORMAL HIGH (ref 0–37)
Albumin: 3.3 g/dL — ABNORMAL LOW (ref 3.5–5.2)
Alkaline Phosphatase: 152 U/L — ABNORMAL HIGH (ref 39–117)
Glucose, Bld: 254 mg/dL — ABNORMAL HIGH (ref 70–99)
Potassium: 3.9 mEq/L (ref 3.5–5.3)
Sodium: 141 mEq/L (ref 135–145)
Total Bilirubin: 0.6 mg/dL (ref 0.3–1.2)
Total Protein: 6.7 g/dL (ref 6.0–8.3)

## 2008-10-22 LAB — COMPREHENSIVE METABOLIC PANEL
Alkaline Phosphatase: 164 U/L — ABNORMAL HIGH (ref 39–117)
CO2: 21 mEq/L (ref 19–32)
Creatinine, Ser: 0.91 mg/dL (ref 0.40–1.50)
Glucose, Bld: 239 mg/dL — ABNORMAL HIGH (ref 70–99)
Total Bilirubin: 0.4 mg/dL (ref 0.3–1.2)

## 2008-10-22 LAB — CBC WITH DIFFERENTIAL/PLATELET
BASO%: 0.2 % (ref 0.0–2.0)
MCHC: 34 g/dL (ref 32.0–36.0)
MONO#: 1.3 10*3/uL — ABNORMAL HIGH (ref 0.1–0.9)
RBC: 3.86 10*6/uL — ABNORMAL LOW (ref 4.20–5.82)
WBC: 13.2 10*3/uL — ABNORMAL HIGH (ref 4.0–10.3)
lymph#: 1.1 10*3/uL (ref 0.9–3.3)

## 2008-10-22 LAB — LACTATE DEHYDROGENASE: LDH: 217 U/L (ref 94–250)

## 2008-10-23 ENCOUNTER — Encounter: Admission: RE | Admit: 2008-10-23 | Discharge: 2008-10-23 | Payer: Self-pay | Admitting: Internal Medicine

## 2008-10-25 ENCOUNTER — Ambulatory Visit: Payer: Self-pay | Admitting: Internal Medicine

## 2008-10-30 LAB — CBC WITH DIFFERENTIAL/PLATELET
Eosinophils Absolute: 0 10*3/uL (ref 0.0–0.5)
HCT: 39.9 % (ref 38.4–49.9)
LYMPH%: 29.1 % (ref 14.0–49.0)
MCHC: 33.3 g/dL (ref 32.0–36.0)
MONO#: 0.3 10*3/uL (ref 0.1–0.9)
NEUT#: 1.1 10*3/uL — ABNORMAL LOW (ref 1.5–6.5)
NEUT%: 56.3 % (ref 39.0–75.0)
Platelets: 134 10*3/uL — ABNORMAL LOW (ref 140–400)
WBC: 2 10*3/uL — ABNORMAL LOW (ref 4.0–10.3)

## 2008-10-31 LAB — COMPREHENSIVE METABOLIC PANEL
ALT: 45 U/L (ref 0–53)
AST: 32 U/L (ref 0–37)
CO2: 22 mEq/L (ref 19–32)
Creatinine, Ser: 0.98 mg/dL (ref 0.40–1.50)
Sodium: 142 mEq/L (ref 135–145)
Total Bilirubin: 0.5 mg/dL (ref 0.3–1.2)
Total Protein: 6.8 g/dL (ref 6.0–8.3)

## 2008-10-31 LAB — BETA 2 MICROGLOBULIN, SERUM: Beta-2 Microglobulin: 2.56 mg/L — ABNORMAL HIGH (ref 1.01–1.73)

## 2008-10-31 LAB — KAPPA/LAMBDA LIGHT CHAINS
Kappa free light chain: 1 mg/dL (ref 0.33–1.94)
Kappa:Lambda Ratio: 0.05 — ABNORMAL LOW (ref 0.26–1.65)

## 2008-10-31 LAB — LACTATE DEHYDROGENASE: LDH: 201 U/L (ref 94–250)

## 2008-11-06 LAB — COMPREHENSIVE METABOLIC PANEL
ALT: 38 U/L (ref 0–53)
BUN: 19 mg/dL (ref 6–23)
CO2: 23 mEq/L (ref 19–32)
Calcium: 8.8 mg/dL (ref 8.4–10.5)
Chloride: 108 mEq/L (ref 96–112)
Creatinine, Ser: 0.95 mg/dL (ref 0.40–1.50)
Glucose, Bld: 226 mg/dL — ABNORMAL HIGH (ref 70–99)
Total Bilirubin: 0.4 mg/dL (ref 0.3–1.2)

## 2008-11-06 LAB — CBC WITH DIFFERENTIAL/PLATELET
Basophils Absolute: 0 10*3/uL (ref 0.0–0.1)
EOS%: 1.2 % (ref 0.0–7.0)
Eosinophils Absolute: 0 10*3/uL (ref 0.0–0.5)
HCT: 37.3 % — ABNORMAL LOW (ref 38.4–49.9)
HGB: 12.7 g/dL — ABNORMAL LOW (ref 13.0–17.1)
MCH: 33.5 pg — ABNORMAL HIGH (ref 27.2–33.4)
MONO#: 0.7 10*3/uL (ref 0.1–0.9)
NEUT#: 1.1 10*3/uL — ABNORMAL LOW (ref 1.5–6.5)
NEUT%: 43.4 % (ref 39.0–75.0)
RDW: 13.6 % (ref 11.0–14.6)
WBC: 2.5 10*3/uL — ABNORMAL LOW (ref 4.0–10.3)
lymph#: 0.7 10*3/uL — ABNORMAL LOW (ref 0.9–3.3)

## 2008-11-06 LAB — LACTATE DEHYDROGENASE: LDH: 200 U/L (ref 94–250)

## 2008-11-13 LAB — CBC WITH DIFFERENTIAL/PLATELET
Basophils Absolute: 0 10*3/uL (ref 0.0–0.1)
Eosinophils Absolute: 0 10*3/uL (ref 0.0–0.5)
HCT: 39.1 % (ref 38.4–49.9)
HGB: 13.3 g/dL (ref 13.0–17.1)
LYMPH%: 6.6 % — ABNORMAL LOW (ref 14.0–49.0)
MCHC: 34 g/dL (ref 32.0–36.0)
MONO#: 1.1 10*3/uL — ABNORMAL HIGH (ref 0.1–0.9)
NEUT%: 88.3 % — ABNORMAL HIGH (ref 39.0–75.0)
Platelets: 100 10*3/uL — ABNORMAL LOW (ref 140–400)
WBC: 21.6 10*3/uL — ABNORMAL HIGH (ref 4.0–10.3)
lymph#: 1.4 10*3/uL (ref 0.9–3.3)

## 2008-11-13 LAB — COMPREHENSIVE METABOLIC PANEL
Albumin: 3.3 g/dL — ABNORMAL LOW (ref 3.5–5.2)
BUN: 14 mg/dL (ref 6–23)
CO2: 23 mEq/L (ref 19–32)
Calcium: 8.4 mg/dL (ref 8.4–10.5)
Chloride: 107 mEq/L (ref 96–112)
Creatinine, Ser: 0.94 mg/dL (ref 0.40–1.50)
Glucose, Bld: 162 mg/dL — ABNORMAL HIGH (ref 70–99)
Potassium: 3.9 mEq/L (ref 3.5–5.3)

## 2008-11-13 LAB — LACTATE DEHYDROGENASE: LDH: 271 U/L — ABNORMAL HIGH (ref 94–250)

## 2008-11-20 LAB — CBC WITH DIFFERENTIAL/PLATELET
Basophils Absolute: 0 10*3/uL (ref 0.0–0.1)
EOS%: 1 % (ref 0.0–7.0)
Eosinophils Absolute: 0 10*3/uL (ref 0.0–0.5)
HGB: 14.2 g/dL (ref 13.0–17.1)
MONO%: 13.1 % (ref 0.0–14.0)
NEUT#: 0.8 10*3/uL — ABNORMAL LOW (ref 1.5–6.5)
RBC: 4.19 10*6/uL — ABNORMAL LOW (ref 4.20–5.82)
RDW: 13 % (ref 11.0–14.6)
lymph#: 0.9 10*3/uL (ref 0.9–3.3)
nRBC: 0 % (ref 0–0)

## 2008-11-23 ENCOUNTER — Ambulatory Visit: Payer: Self-pay | Admitting: Internal Medicine

## 2008-11-27 LAB — CBC WITH DIFFERENTIAL/PLATELET
BASO%: 0.2 % (ref 0.0–2.0)
Basophils Absolute: 0 10*3/uL (ref 0.0–0.1)
EOS%: 0.2 % (ref 0.0–7.0)
HCT: 40.5 % (ref 38.4–49.9)
HGB: 13.7 g/dL (ref 13.0–17.1)
LYMPH%: 12.4 % — ABNORMAL LOW (ref 14.0–49.0)
MCH: 33.8 pg — ABNORMAL HIGH (ref 27.2–33.4)
MCHC: 33.8 g/dL (ref 32.0–36.0)
NEUT%: 75.8 % — ABNORMAL HIGH (ref 39.0–75.0)
Platelets: 147 10*3/uL (ref 140–400)
lymph#: 1.5 10*3/uL (ref 0.9–3.3)

## 2008-11-27 LAB — COMPREHENSIVE METABOLIC PANEL
AST: 25 U/L (ref 0–37)
BUN: 16 mg/dL (ref 6–23)
CO2: 23 mEq/L (ref 19–32)
Calcium: 8.6 mg/dL (ref 8.4–10.5)
Chloride: 103 mEq/L (ref 96–112)
Creatinine, Ser: 0.98 mg/dL (ref 0.40–1.50)
Total Bilirubin: 0.5 mg/dL (ref 0.3–1.2)

## 2008-11-27 LAB — LACTATE DEHYDROGENASE: LDH: 226 U/L (ref 94–250)

## 2008-12-04 LAB — COMPREHENSIVE METABOLIC PANEL
ALT: 35 U/L (ref 0–53)
AST: 31 U/L (ref 0–37)
Albumin: 3.5 g/dL (ref 3.5–5.2)
CO2: 21 mEq/L (ref 19–32)
Calcium: 8.8 mg/dL (ref 8.4–10.5)
Chloride: 105 mEq/L (ref 96–112)
Potassium: 3.9 mEq/L (ref 3.5–5.3)
Total Protein: 6.8 g/dL (ref 6.0–8.3)

## 2008-12-04 LAB — CBC WITH DIFFERENTIAL/PLATELET
BASO%: 3.9 % — ABNORMAL HIGH (ref 0.0–2.0)
EOS%: 1 % (ref 0.0–7.0)
Eosinophils Absolute: 0 10*3/uL (ref 0.0–0.5)
LYMPH%: 42.4 % (ref 14.0–49.0)
MCH: 35 pg — ABNORMAL HIGH (ref 27.2–33.4)
MCHC: 35 g/dL (ref 32.0–36.0)
MCV: 100 fL — ABNORMAL HIGH (ref 79.3–98.0)
MONO%: 10.1 % (ref 0.0–14.0)
Platelets: 165 10*3/uL (ref 140–400)
RBC: 3.93 10*6/uL — ABNORMAL LOW (ref 4.20–5.82)
RDW: 13.4 % (ref 11.0–14.6)

## 2008-12-04 LAB — LACTATE DEHYDROGENASE: LDH: 200 U/L (ref 94–250)

## 2008-12-11 LAB — COMPREHENSIVE METABOLIC PANEL
ALT: 24 U/L (ref 0–53)
AST: 21 U/L (ref 0–37)
Chloride: 107 mEq/L (ref 96–112)
Creatinine, Ser: 1.04 mg/dL (ref 0.40–1.50)
Sodium: 140 mEq/L (ref 135–145)
Total Bilirubin: 0.5 mg/dL (ref 0.3–1.2)
Total Protein: 7.4 g/dL (ref 6.0–8.3)

## 2008-12-11 LAB — CBC WITH DIFFERENTIAL/PLATELET
BASO%: 0.1 % (ref 0.0–2.0)
LYMPH%: 11.5 % — ABNORMAL LOW (ref 14.0–49.0)
MCHC: 34.7 g/dL (ref 32.0–36.0)
MONO#: 1.2 10*3/uL — ABNORMAL HIGH (ref 0.1–0.9)
MONO%: 8.6 % (ref 0.0–14.0)
Platelets: 147 10*3/uL (ref 140–400)
RBC: 4.14 10*6/uL — ABNORMAL LOW (ref 4.20–5.82)
RDW: 12.9 % (ref 11.0–14.6)
WBC: 14 10*3/uL — ABNORMAL HIGH (ref 4.0–10.3)
nRBC: 0 % (ref 0–0)

## 2008-12-18 LAB — LACTATE DEHYDROGENASE: LDH: 275 U/L — ABNORMAL HIGH (ref 94–250)

## 2008-12-18 LAB — CBC WITH DIFFERENTIAL/PLATELET
Basophils Absolute: 0 10*3/uL (ref 0.0–0.1)
Eosinophils Absolute: 0 10*3/uL (ref 0.0–0.5)
HCT: 38.3 % — ABNORMAL LOW (ref 38.4–49.9)
HGB: 13 g/dL (ref 13.0–17.1)
MCV: 99.7 fL — ABNORMAL HIGH (ref 79.3–98.0)
MONO%: 1.7 % (ref 0.0–14.0)
NEUT#: 26.6 10*3/uL — ABNORMAL HIGH (ref 1.5–6.5)
NEUT%: 93.6 % — ABNORMAL HIGH (ref 39.0–75.0)
RDW: 13.3 % (ref 11.0–14.6)

## 2008-12-18 LAB — COMPREHENSIVE METABOLIC PANEL
BUN: 18 mg/dL (ref 6–23)
CO2: 22 mEq/L (ref 19–32)
Calcium: 8.9 mg/dL (ref 8.4–10.5)
Chloride: 106 mEq/L (ref 96–112)
Creatinine, Ser: 1.02 mg/dL (ref 0.40–1.50)

## 2008-12-25 ENCOUNTER — Ambulatory Visit: Payer: Self-pay | Admitting: Internal Medicine

## 2008-12-25 LAB — COMPREHENSIVE METABOLIC PANEL
BUN: 20 mg/dL (ref 6–23)
CO2: 22 mEq/L (ref 19–32)
Calcium: 8.3 mg/dL — ABNORMAL LOW (ref 8.4–10.5)
Creatinine, Ser: 1.05 mg/dL (ref 0.40–1.50)
Glucose, Bld: 170 mg/dL — ABNORMAL HIGH (ref 70–99)
Total Bilirubin: 0.6 mg/dL (ref 0.3–1.2)

## 2008-12-25 LAB — CBC WITH DIFFERENTIAL/PLATELET
BASO%: 0 % (ref 0.0–2.0)
Basophils Absolute: 0 10*3/uL (ref 0.0–0.1)
Eosinophils Absolute: 0 10*3/uL (ref 0.0–0.5)
HCT: 39.4 % (ref 38.4–49.9)
HGB: 13.4 g/dL (ref 13.0–17.1)
LYMPH%: 30 % (ref 14.0–49.0)
MCHC: 34 g/dL (ref 32.0–36.0)
MONO#: 0.4 10*3/uL (ref 0.1–0.9)
NEUT%: 55.3 % (ref 39.0–75.0)
Platelets: 157 10*3/uL (ref 140–400)
WBC: 3 10*3/uL — ABNORMAL LOW (ref 4.0–10.3)

## 2008-12-25 LAB — LACTATE DEHYDROGENASE: LDH: 200 U/L (ref 94–250)

## 2009-01-01 LAB — COMPREHENSIVE METABOLIC PANEL
Albumin: 3.7 g/dL (ref 3.5–5.2)
CO2: 25 mEq/L (ref 19–32)
Glucose, Bld: 334 mg/dL — ABNORMAL HIGH (ref 70–99)
Potassium: 4.2 mEq/L (ref 3.5–5.3)
Sodium: 140 mEq/L (ref 135–145)
Total Protein: 7.1 g/dL (ref 6.0–8.3)

## 2009-01-01 LAB — LACTATE DEHYDROGENASE: LDH: 209 U/L (ref 94–250)

## 2009-01-01 LAB — CBC WITH DIFFERENTIAL/PLATELET
Basophils Absolute: 0 10*3/uL (ref 0.0–0.1)
HCT: 40.8 % (ref 38.4–49.9)
HGB: 13.8 g/dL (ref 13.0–17.1)
MONO#: 1.6 10*3/uL — ABNORMAL HIGH (ref 0.1–0.9)
NEUT#: 16.9 10*3/uL — ABNORMAL HIGH (ref 1.5–6.5)
NEUT%: 83.1 % — ABNORMAL HIGH (ref 39.0–75.0)
RDW: 13.6 % (ref 11.0–14.6)
WBC: 20.4 10*3/uL — ABNORMAL HIGH (ref 4.0–10.3)
lymph#: 1.8 10*3/uL (ref 0.9–3.3)

## 2009-01-08 LAB — COMPREHENSIVE METABOLIC PANEL
ALT: 21 U/L (ref 0–53)
AST: 18 U/L (ref 0–37)
Albumin: 3.7 g/dL (ref 3.5–5.2)
Calcium: 9.1 mg/dL (ref 8.4–10.5)
Chloride: 102 mEq/L (ref 96–112)
Creatinine, Ser: 0.97 mg/dL (ref 0.40–1.50)
Potassium: 4.3 mEq/L (ref 3.5–5.3)

## 2009-01-08 LAB — CBC WITH DIFFERENTIAL/PLATELET
BASO%: 0.5 % (ref 0.0–2.0)
MCHC: 33.9 g/dL (ref 32.0–36.0)
MONO#: 0.2 10*3/uL (ref 0.1–0.9)
RBC: 4.25 10*6/uL (ref 4.20–5.82)
RDW: 13 % (ref 11.0–14.6)
WBC: 2.2 10*3/uL — ABNORMAL LOW (ref 4.0–10.3)
lymph#: 0.9 10*3/uL (ref 0.9–3.3)
nRBC: 0 % (ref 0–0)

## 2009-01-15 LAB — COMPREHENSIVE METABOLIC PANEL
ALT: 22 U/L (ref 0–53)
Albumin: 3.8 g/dL (ref 3.5–5.2)
CO2: 20 mEq/L (ref 19–32)
Calcium: 8.9 mg/dL (ref 8.4–10.5)
Chloride: 105 mEq/L (ref 96–112)
Sodium: 139 mEq/L (ref 135–145)
Total Protein: 6.8 g/dL (ref 6.0–8.3)

## 2009-01-15 LAB — CBC WITH DIFFERENTIAL/PLATELET
BASO%: 0.4 % (ref 0.0–2.0)
EOS%: 1 % (ref 0.0–7.0)
HCT: 39.8 % (ref 38.4–49.9)
MCH: 34 pg — ABNORMAL HIGH (ref 27.2–33.4)
MCHC: 33.7 g/dL (ref 32.0–36.0)
NEUT%: 62.9 % (ref 39.0–75.0)
RDW: 13.9 % (ref 11.0–14.6)
lymph#: 1 10*3/uL (ref 0.9–3.3)

## 2009-01-15 LAB — LACTATE DEHYDROGENASE: LDH: 233 U/L (ref 94–250)

## 2009-01-16 IMAGING — CR DG HIP (WITH OR WITHOUT PELVIS) 2-3V*L*
3 series · 3 of 3 positions shown · non-contrast
Comparison: None.

CLINICAL DATA: Left hip pain, history of myeloma.

LEFT HIP - COMPLETE 2+ VIEW

[view not recorded (1 of 3)]
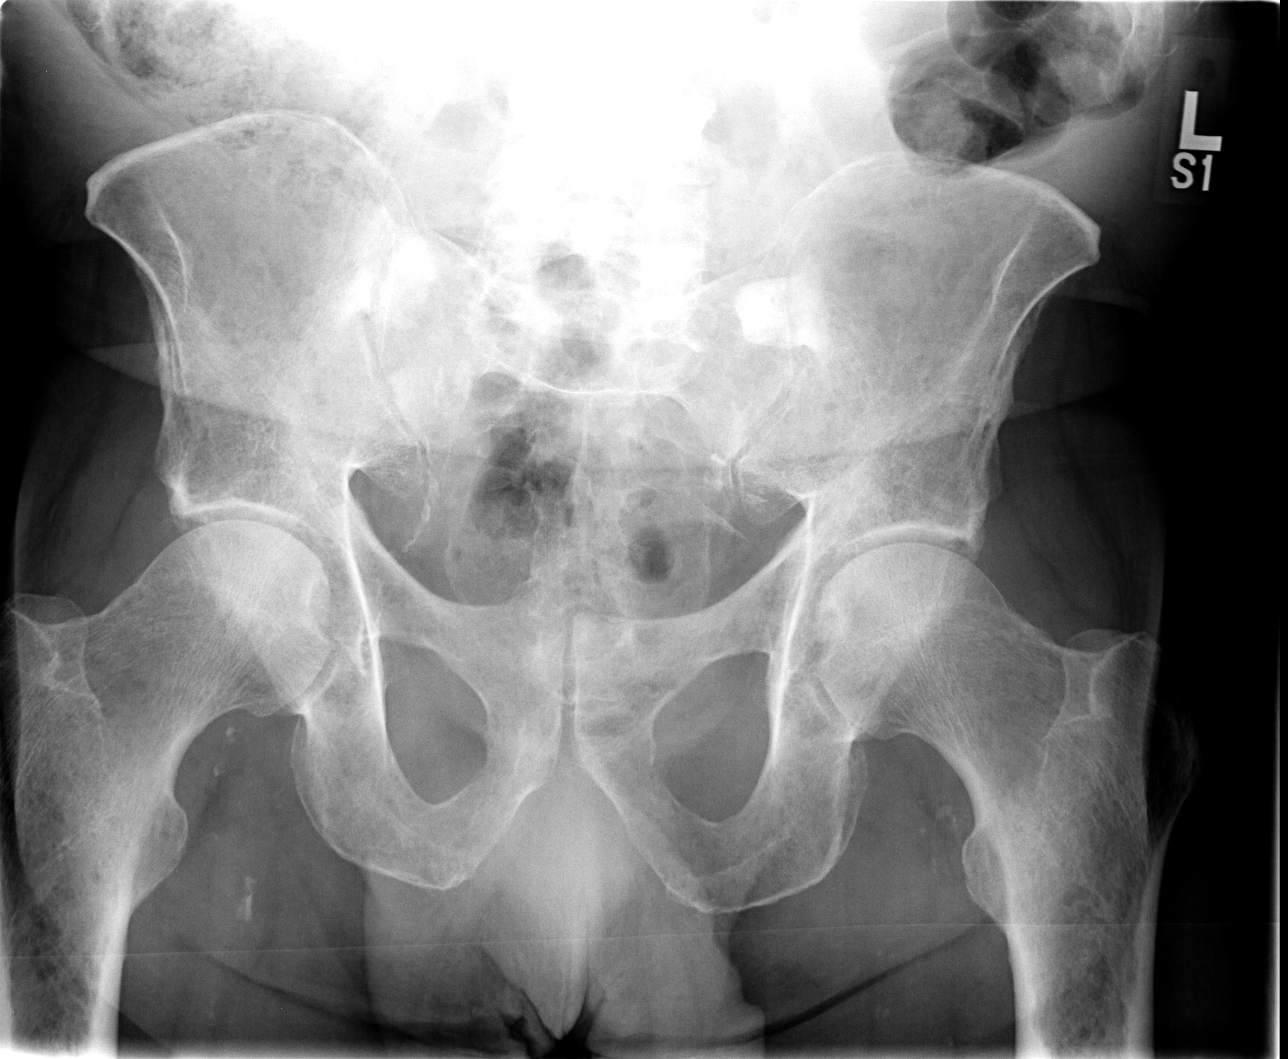

[view not recorded (2 of 3)]
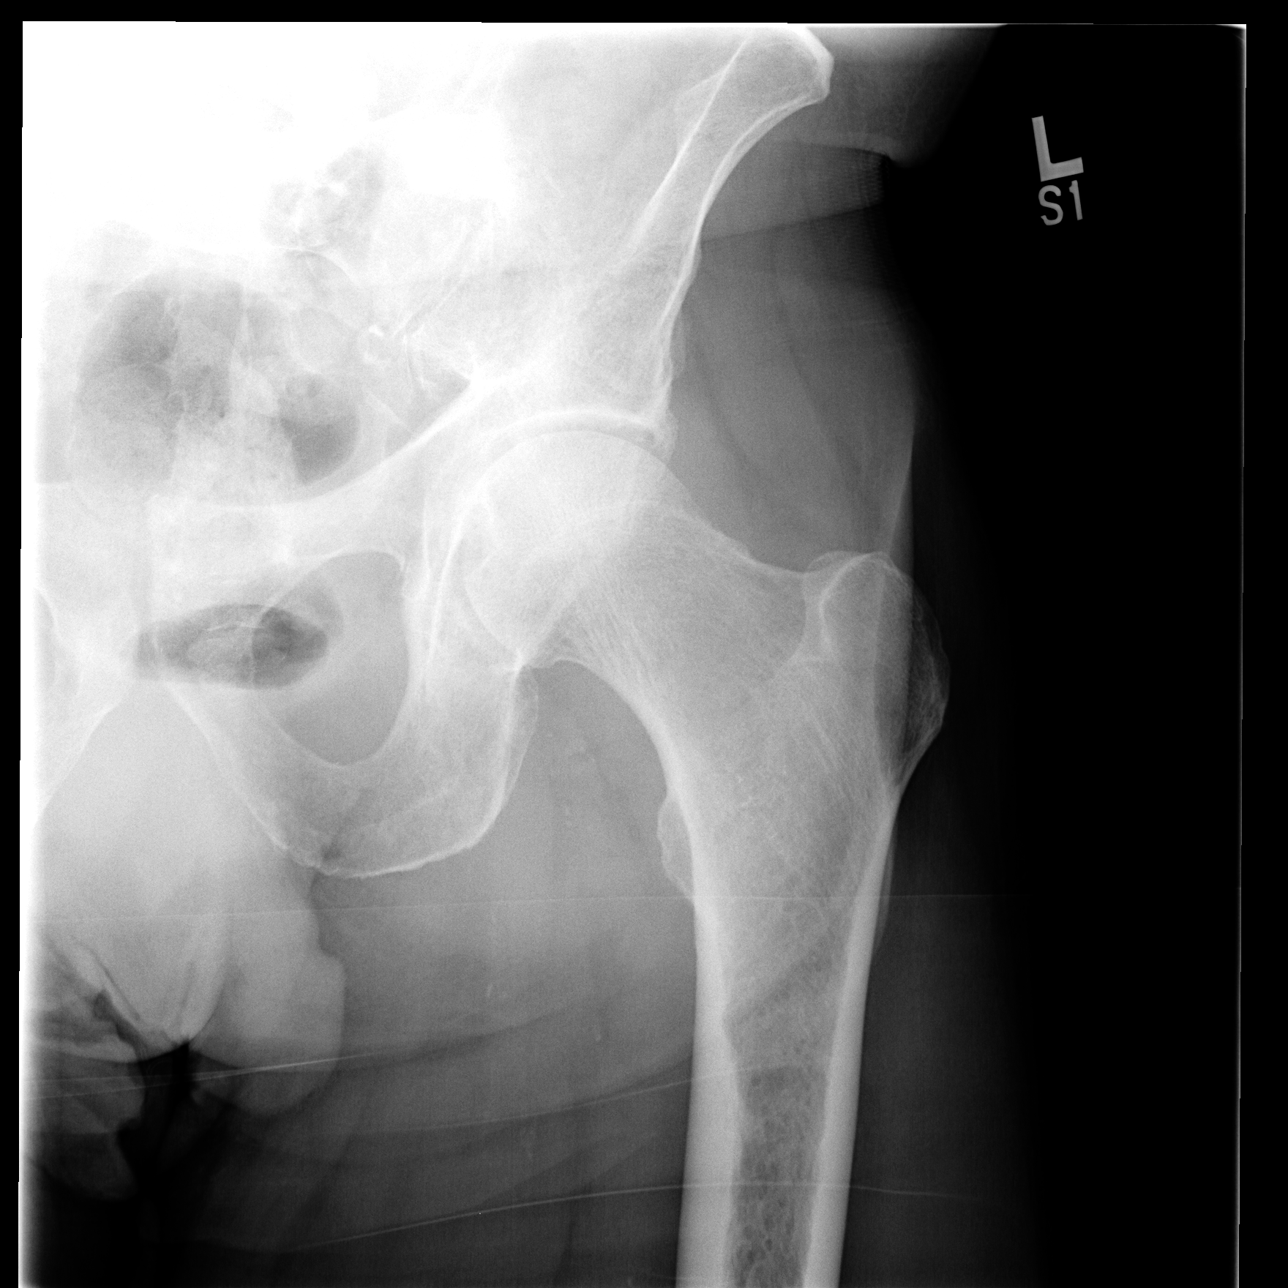

[view not recorded (3 of 3)]
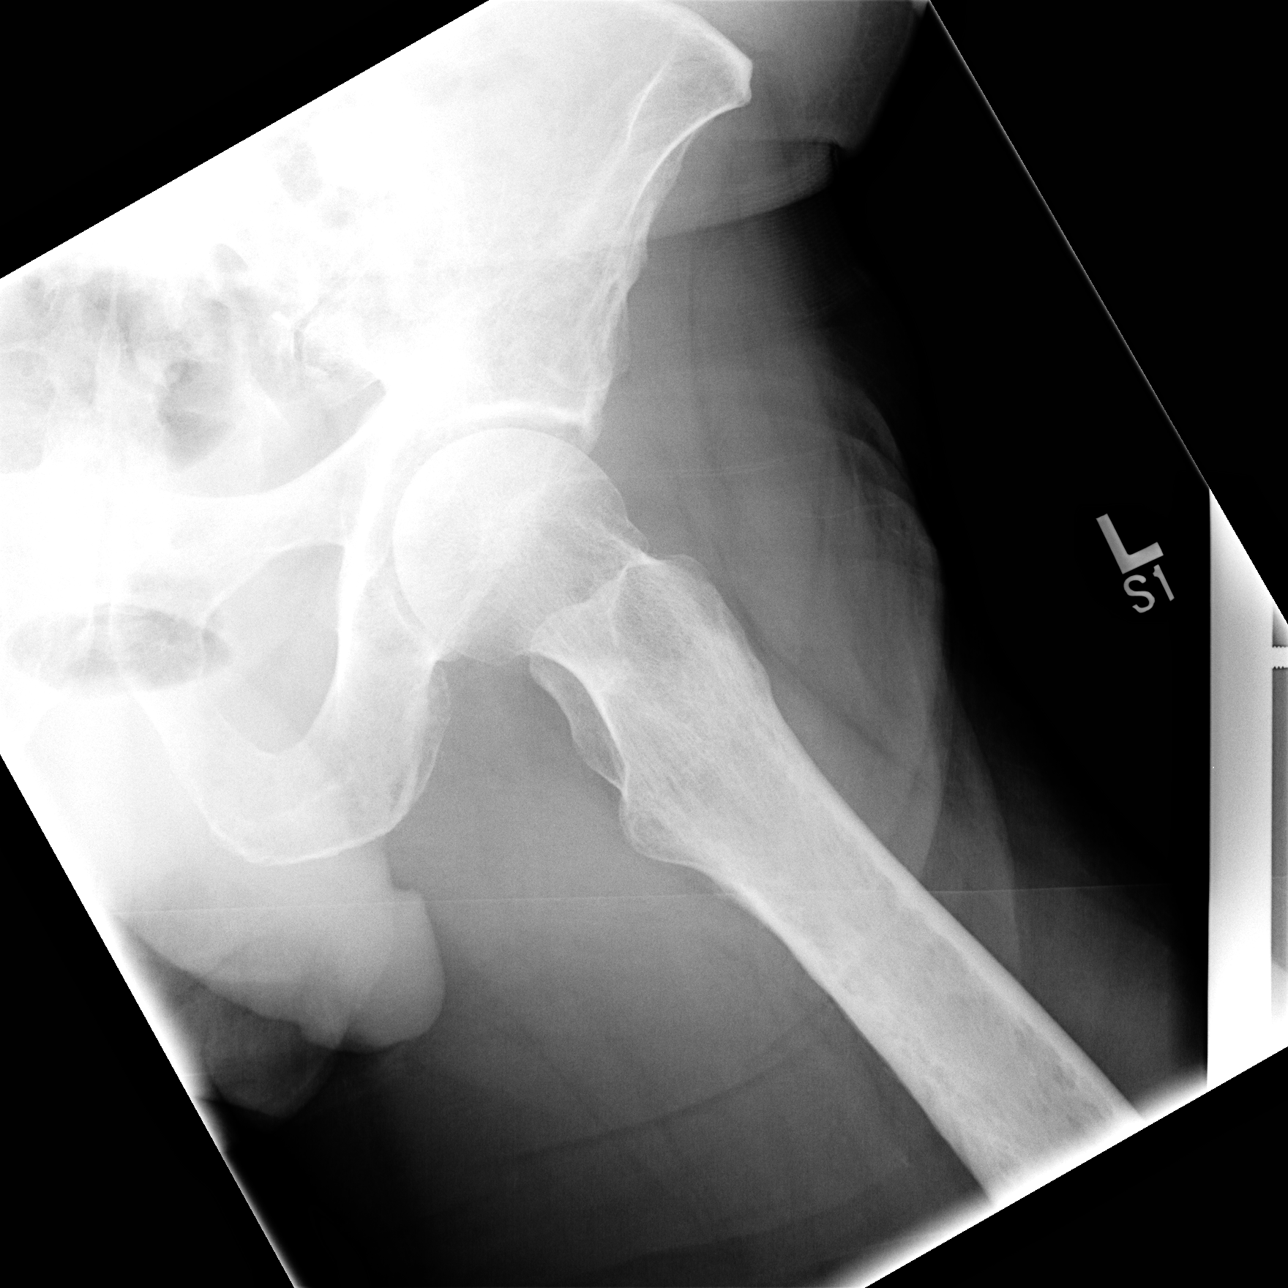

[3 of 3 positions shown; findings below may reference images not displayed]

FINDINGS: Multiple small lucencies are seen throughout all the
bones consistent with widespread myeloma.  There is mild joint
space narrowing both hips.  I do not see a large destructive lesion
that might suggest incipient pathologic fracture.  Degenerative
change are present in the spine.  Vascular calcification is noted.
There is osteophytic spurring adjacent to the sacroiliac joints.
IMPRESSION: Numerous subcentimeter punched out lesions within the bones of both
hips, both femurs and the pelvis; findings consistent with
widespread multiple myeloma.

## 2009-01-22 ENCOUNTER — Ambulatory Visit: Payer: Self-pay | Admitting: Internal Medicine

## 2009-01-22 LAB — CBC WITH DIFFERENTIAL/PLATELET
BASO%: 0.1 % (ref 0.0–2.0)
Basophils Absolute: 0 10*3/uL (ref 0.0–0.1)
EOS%: 0.7 % (ref 0.0–7.0)
HGB: 13.8 g/dL (ref 13.0–17.1)
MCH: 33.4 pg (ref 27.2–33.4)
RDW: 13.3 % (ref 11.0–14.6)
lymph#: 1.1 10*3/uL (ref 0.9–3.3)
nRBC: 0 % (ref 0–0)

## 2009-01-22 LAB — COMPREHENSIVE METABOLIC PANEL
ALT: 18 U/L (ref 0–53)
AST: 18 U/L (ref 0–37)
Albumin: 3.9 g/dL (ref 3.5–5.2)
Alkaline Phosphatase: 146 U/L — ABNORMAL HIGH (ref 39–117)
Glucose, Bld: 180 mg/dL — ABNORMAL HIGH (ref 70–99)
Potassium: 3.8 mEq/L (ref 3.5–5.3)
Sodium: 136 mEq/L (ref 135–145)
Total Protein: 7.2 g/dL (ref 6.0–8.3)

## 2009-01-29 LAB — CBC WITH DIFFERENTIAL/PLATELET
BASO%: 0.1 % (ref 0.0–2.0)
Basophils Absolute: 0 10*3/uL (ref 0.0–0.1)
EOS%: 0.2 % (ref 0.0–7.0)
HGB: 13.8 g/dL (ref 13.0–17.1)
MCH: 33 pg (ref 27.2–33.4)
MCHC: 33.5 g/dL (ref 32.0–36.0)
RDW: 13.1 % (ref 11.0–14.6)
WBC: 28.4 10*3/uL — ABNORMAL HIGH (ref 4.0–10.3)
lymph#: 1.4 10*3/uL (ref 0.9–3.3)

## 2009-02-05 LAB — CBC WITH DIFFERENTIAL/PLATELET
Eosinophils Absolute: 0 10*3/uL (ref 0.0–0.5)
MONO#: 0.4 10*3/uL (ref 0.1–0.9)
NEUT#: 0.6 10*3/uL — ABNORMAL LOW (ref 1.5–6.5)
RBC: 4.2 10*6/uL (ref 4.20–5.82)
RDW: 12.9 % (ref 11.0–14.6)
WBC: 1.9 10*3/uL — ABNORMAL LOW (ref 4.0–10.3)

## 2009-02-12 LAB — CBC WITH DIFFERENTIAL/PLATELET
Basophils Absolute: 0.1 10*3/uL (ref 0.0–0.1)
EOS%: 0.1 % (ref 0.0–7.0)
Eosinophils Absolute: 0 10*3/uL (ref 0.0–0.5)
HGB: 13.8 g/dL (ref 13.0–17.1)
NEUT#: 13.6 10*3/uL — ABNORMAL HIGH (ref 1.5–6.5)
RDW: 13.7 % (ref 11.0–14.6)
lymph#: 1.1 10*3/uL (ref 0.9–3.3)

## 2009-02-13 LAB — KAPPA/LAMBDA LIGHT CHAINS
Kappa:Lambda Ratio: 0.08 — ABNORMAL LOW (ref 0.26–1.65)
Lambda Free Lght Chn: 15.4 mg/dL — ABNORMAL HIGH (ref 0.57–2.63)

## 2009-02-13 LAB — COMPREHENSIVE METABOLIC PANEL
ALT: 17 U/L (ref 0–53)
AST: 20 U/L (ref 0–37)
Albumin: 3.9 g/dL (ref 3.5–5.2)
BUN: 20 mg/dL (ref 6–23)
Calcium: 10 mg/dL (ref 8.4–10.5)
Chloride: 109 mEq/L (ref 96–112)
Potassium: 3.8 mEq/L (ref 3.5–5.3)

## 2009-02-13 LAB — IGG, IGA, IGM
IgA: 834 mg/dL — ABNORMAL HIGH (ref 68–378)
IgG (Immunoglobin G), Serum: 1030 mg/dL (ref 694–1618)
IgM, Serum: 46 mg/dL — ABNORMAL LOW (ref 60–263)

## 2009-02-19 LAB — CBC WITH DIFFERENTIAL/PLATELET
Basophils Absolute: 0 10*3/uL (ref 0.0–0.1)
Eosinophils Absolute: 0 10*3/uL (ref 0.0–0.5)
HGB: 13.3 g/dL (ref 13.0–17.1)
MCV: 98 fL (ref 79.3–98.0)
MONO%: 13.7 % (ref 0.0–14.0)
NEUT#: 1 10*3/uL — ABNORMAL LOW (ref 1.5–6.5)
Platelets: 162 10*3/uL (ref 140–400)
RDW: 13 % (ref 11.0–14.6)

## 2009-02-23 ENCOUNTER — Ambulatory Visit: Payer: Self-pay | Admitting: Internal Medicine

## 2009-02-26 LAB — COMPREHENSIVE METABOLIC PANEL
ALT: 21 U/L (ref 0–53)
Albumin: 3.8 g/dL (ref 3.5–5.2)
CO2: 22 mEq/L (ref 19–32)
Calcium: 8.9 mg/dL (ref 8.4–10.5)
Creatinine, Ser: 1.12 mg/dL (ref 0.40–1.50)

## 2009-02-26 LAB — CBC WITH DIFFERENTIAL/PLATELET
LYMPH%: 32.6 % (ref 14.0–49.0)
MCV: 97.4 fL (ref 79.3–98.0)
NEUT#: 1.4 10*3/uL — ABNORMAL LOW (ref 1.5–6.5)
NEUT%: 42.9 % (ref 39.0–75.0)
RDW: 12.9 % (ref 11.0–14.6)

## 2009-02-26 LAB — LACTATE DEHYDROGENASE: LDH: 196 U/L (ref 94–250)

## 2009-03-05 LAB — CBC WITH DIFFERENTIAL/PLATELET
BASO%: 0.1 % (ref 0.0–2.0)
Basophils Absolute: 0 10*3/uL (ref 0.0–0.1)
HGB: 13.4 g/dL (ref 13.0–17.1)
LYMPH%: 6 % — ABNORMAL LOW (ref 14.0–49.0)
MCH: 33.3 pg (ref 27.2–33.4)
MCHC: 33.3 g/dL (ref 32.0–36.0)
MONO#: 0.7 10*3/uL (ref 0.1–0.9)
NEUT#: 20.3 10*3/uL — ABNORMAL HIGH (ref 1.5–6.5)
NEUT%: 90.7 % — ABNORMAL HIGH (ref 39.0–75.0)
Platelets: 167 10*3/uL (ref 140–400)
RDW: 13.3 % (ref 11.0–14.6)
lymph#: 1.4 10*3/uL (ref 0.9–3.3)

## 2009-03-05 LAB — COMPREHENSIVE METABOLIC PANEL
Albumin: 3.6 g/dL (ref 3.5–5.2)
Alkaline Phosphatase: 163 U/L — ABNORMAL HIGH (ref 39–117)
BUN: 16 mg/dL (ref 6–23)
Calcium: 8.8 mg/dL (ref 8.4–10.5)
Chloride: 104 mEq/L (ref 96–112)
Creatinine, Ser: 1.07 mg/dL (ref 0.40–1.50)
Glucose, Bld: 258 mg/dL — ABNORMAL HIGH (ref 70–99)
Sodium: 140 mEq/L (ref 135–145)
Total Bilirubin: 0.5 mg/dL (ref 0.3–1.2)

## 2009-03-05 LAB — LACTATE DEHYDROGENASE: LDH: 252 U/L — ABNORMAL HIGH (ref 94–250)

## 2009-03-12 LAB — CBC WITH DIFFERENTIAL/PLATELET
BASO%: 0.5 % (ref 0.0–2.0)
EOS%: 1.9 % (ref 0.0–7.0)
Eosinophils Absolute: 0 10*3/uL (ref 0.0–0.5)
LYMPH%: 39.7 % (ref 14.0–49.0)
MONO%: 16.7 % — ABNORMAL HIGH (ref 0.0–14.0)
lymph#: 0.8 10*3/uL — ABNORMAL LOW (ref 0.9–3.3)

## 2009-03-12 LAB — COMPREHENSIVE METABOLIC PANEL
AST: 21 U/L (ref 0–37)
Albumin: 3.9 g/dL (ref 3.5–5.2)
BUN: 22 mg/dL (ref 6–23)
CO2: 22 mEq/L (ref 19–32)
Calcium: 9.1 mg/dL (ref 8.4–10.5)
Chloride: 104 mEq/L (ref 96–112)
Creatinine, Ser: 1.12 mg/dL (ref 0.40–1.50)
Glucose, Bld: 204 mg/dL — ABNORMAL HIGH (ref 70–99)
Potassium: 3.9 mEq/L (ref 3.5–5.3)

## 2009-03-19 LAB — CBC WITH DIFFERENTIAL/PLATELET
BASO%: 0.3 % (ref 0.0–2.0)
Basophils Absolute: 0.1 10*3/uL (ref 0.0–0.1)
EOS%: 0.4 % (ref 0.0–7.0)
HCT: 42.2 % (ref 38.4–49.9)
HGB: 14.1 g/dL (ref 13.0–17.1)
MCH: 33 pg (ref 27.2–33.4)
MONO#: 1.4 10*3/uL — ABNORMAL HIGH (ref 0.1–0.9)
NEUT%: 82.8 % — ABNORMAL HIGH (ref 39.0–75.0)
RDW: 13.4 % (ref 11.0–14.6)
WBC: 18.4 10*3/uL — ABNORMAL HIGH (ref 4.0–10.3)
lymph#: 1.6 10*3/uL (ref 0.9–3.3)

## 2009-03-19 LAB — COMPREHENSIVE METABOLIC PANEL
AST: 19 U/L (ref 0–37)
Albumin: 3.9 g/dL (ref 3.5–5.2)
Alkaline Phosphatase: 155 U/L — ABNORMAL HIGH (ref 39–117)
Calcium: 9.5 mg/dL (ref 8.4–10.5)
Chloride: 102 mEq/L (ref 96–112)
Potassium: 4 mEq/L (ref 3.5–5.3)
Sodium: 141 mEq/L (ref 135–145)
Total Protein: 7.3 g/dL (ref 6.0–8.3)

## 2009-03-26 ENCOUNTER — Ambulatory Visit: Payer: Self-pay | Admitting: Internal Medicine

## 2009-03-26 LAB — CBC WITH DIFFERENTIAL/PLATELET
BASO%: 0.3 % (ref 0.0–2.0)
EOS%: 1.5 % (ref 0.0–7.0)
HCT: 43.1 % (ref 38.4–49.9)
LYMPH%: 34.5 % (ref 14.0–49.0)
MCH: 33.3 pg (ref 27.2–33.4)
MCHC: 34.1 g/dL (ref 32.0–36.0)
MCV: 97.7 fL (ref 79.3–98.0)
MONO#: 0.4 10*3/uL (ref 0.1–0.9)
NEUT%: 52.4 % (ref 39.0–75.0)
Platelets: 157 10*3/uL (ref 140–400)

## 2009-03-26 LAB — COMPREHENSIVE METABOLIC PANEL
ALT: 18 U/L (ref 0–53)
Alkaline Phosphatase: 109 U/L (ref 39–117)
Sodium: 135 mEq/L (ref 135–145)
Total Bilirubin: 0.8 mg/dL (ref 0.3–1.2)
Total Protein: 6.7 g/dL (ref 6.0–8.3)

## 2009-04-02 LAB — CBC WITH DIFFERENTIAL/PLATELET
Basophils Absolute: 0.1 10*3/uL (ref 0.0–0.1)
Eosinophils Absolute: 0.1 10*3/uL (ref 0.0–0.5)
HGB: 14.4 g/dL (ref 13.0–17.1)
MCV: 98.2 fL — ABNORMAL HIGH (ref 79.3–98.0)
MONO#: 1.6 10*3/uL — ABNORMAL HIGH (ref 0.1–0.9)
MONO%: 7.1 % (ref 0.0–14.0)
NEUT#: 18.6 10*3/uL — ABNORMAL HIGH (ref 1.5–6.5)
RDW: 13 % (ref 11.0–14.6)
lymph#: 1.6 10*3/uL (ref 0.9–3.3)

## 2009-04-02 LAB — COMPREHENSIVE METABOLIC PANEL
AST: 19 U/L (ref 0–37)
BUN: 25 mg/dL — ABNORMAL HIGH (ref 6–23)
Calcium: 9 mg/dL (ref 8.4–10.5)
Chloride: 102 mEq/L (ref 96–112)
Creatinine, Ser: 1.1 mg/dL (ref 0.40–1.50)

## 2009-04-09 LAB — CBC WITH DIFFERENTIAL/PLATELET
Basophils Absolute: 0.1 10*3/uL (ref 0.0–0.1)
Eosinophils Absolute: 0 10*3/uL (ref 0.0–0.5)
HCT: 41.1 % (ref 38.4–49.9)
HGB: 14.1 g/dL (ref 13.0–17.1)
LYMPH%: 34.5 % (ref 14.0–49.0)
MONO#: 0.3 10*3/uL (ref 0.1–0.9)
NEUT#: 1.7 10*3/uL (ref 1.5–6.5)
NEUT%: 52.6 % (ref 39.0–75.0)
Platelets: 216 10*3/uL (ref 140–400)
WBC: 3.2 10*3/uL — ABNORMAL LOW (ref 4.0–10.3)

## 2009-04-09 LAB — COMPREHENSIVE METABOLIC PANEL
CO2: 26 mEq/L (ref 19–32)
Calcium: 9.3 mg/dL (ref 8.4–10.5)
Chloride: 105 mEq/L (ref 96–112)
Creatinine, Ser: 1.21 mg/dL (ref 0.40–1.50)
Glucose, Bld: 133 mg/dL — ABNORMAL HIGH (ref 70–99)
Total Bilirubin: 0.9 mg/dL (ref 0.3–1.2)

## 2009-04-09 LAB — LACTATE DEHYDROGENASE: LDH: 256 U/L — ABNORMAL HIGH (ref 94–250)

## 2009-04-16 LAB — CBC WITH DIFFERENTIAL/PLATELET
BASO%: 0.1 % (ref 0.0–2.0)
EOS%: 0.3 % (ref 0.0–7.0)
HCT: 40.9 % (ref 38.4–49.9)
HGB: 13.7 g/dL (ref 13.0–17.1)
MCHC: 33.5 g/dL (ref 32.0–36.0)
MONO#: 1.2 10*3/uL — ABNORMAL HIGH (ref 0.1–0.9)
NEUT%: 85.5 % — ABNORMAL HIGH (ref 39.0–75.0)
RDW: 13.1 % (ref 11.0–14.6)
WBC: 18.5 10*3/uL — ABNORMAL HIGH (ref 4.0–10.3)
lymph#: 1.4 10*3/uL (ref 0.9–3.3)

## 2009-04-16 LAB — COMPREHENSIVE METABOLIC PANEL
ALT: 19 U/L (ref 0–53)
Albumin: 3.6 g/dL (ref 3.5–5.2)
CO2: 22 mEq/L (ref 19–32)
Glucose, Bld: 170 mg/dL — ABNORMAL HIGH (ref 70–99)
Potassium: 4.1 mEq/L (ref 3.5–5.3)
Sodium: 143 mEq/L (ref 135–145)
Total Bilirubin: 0.5 mg/dL (ref 0.3–1.2)
Total Protein: 6.9 g/dL (ref 6.0–8.3)

## 2009-04-16 LAB — LACTATE DEHYDROGENASE: LDH: 226 U/L (ref 94–250)

## 2009-04-19 ENCOUNTER — Other Ambulatory Visit: Admission: RE | Admit: 2009-04-19 | Discharge: 2009-04-19 | Payer: Self-pay | Admitting: Internal Medicine

## 2009-04-23 LAB — CBC WITH DIFFERENTIAL/PLATELET
Basophils Absolute: 0 10*3/uL (ref 0.0–0.1)
EOS%: 1.9 % (ref 0.0–7.0)
HGB: 14.2 g/dL (ref 13.0–17.1)
LYMPH%: 41.7 % (ref 14.0–49.0)
MCH: 32.8 pg (ref 27.2–33.4)
MCV: 97 fL (ref 79.3–98.0)
MONO%: 12.8 % (ref 0.0–14.0)
Platelets: 156 10*3/uL (ref 140–400)
RDW: 12.7 % (ref 11.0–14.6)

## 2009-04-24 LAB — KAPPA/LAMBDA LIGHT CHAINS
Kappa free light chain: 0.88 mg/dL (ref 0.33–1.94)
Lambda Free Lght Chn: 12.6 mg/dL — ABNORMAL HIGH (ref 0.57–2.63)

## 2009-04-24 LAB — COMPREHENSIVE METABOLIC PANEL
AST: 26 U/L (ref 0–37)
Alkaline Phosphatase: 116 U/L (ref 39–117)
BUN: 22 mg/dL (ref 6–23)
Calcium: 8.9 mg/dL (ref 8.4–10.5)
Creatinine, Ser: 1.16 mg/dL (ref 0.40–1.50)
Glucose, Bld: 252 mg/dL — ABNORMAL HIGH (ref 70–99)

## 2009-04-24 LAB — IGG, IGA, IGM
IgA: 840 mg/dL — ABNORMAL HIGH (ref 68–378)
IgG (Immunoglobin G), Serum: 1000 mg/dL (ref 694–1618)
IgM, Serum: 53 mg/dL — ABNORMAL LOW (ref 60–263)

## 2009-04-26 ENCOUNTER — Ambulatory Visit: Payer: Self-pay | Admitting: Internal Medicine

## 2009-04-30 LAB — CBC WITH DIFFERENTIAL/PLATELET
BASO%: 0.7 % (ref 0.0–2.0)
EOS%: 0.4 % (ref 0.0–7.0)
Eosinophils Absolute: 0 10*3/uL (ref 0.0–0.5)
LYMPH%: 17.3 % (ref 14.0–49.0)
MCH: 34.2 pg — ABNORMAL HIGH (ref 27.2–33.4)
MCHC: 34.4 g/dL (ref 32.0–36.0)
MCV: 99.6 fL — ABNORMAL HIGH (ref 79.3–98.0)
MONO%: 7.2 % (ref 0.0–14.0)
NEUT#: 6.7 10*3/uL — ABNORMAL HIGH (ref 1.5–6.5)
Platelets: 189 10*3/uL (ref 140–400)
RBC: 3.75 10*6/uL — ABNORMAL LOW (ref 4.20–5.82)
RDW: 13.4 % (ref 11.0–14.6)

## 2009-04-30 LAB — COMPREHENSIVE METABOLIC PANEL
ALT: 18 U/L (ref 0–53)
AST: 19 U/L (ref 0–37)
Albumin: 3.7 g/dL (ref 3.5–5.2)
Alkaline Phosphatase: 119 U/L — ABNORMAL HIGH (ref 39–117)
Potassium: 4.5 mEq/L (ref 3.5–5.3)
Sodium: 142 mEq/L (ref 135–145)
Total Bilirubin: 0.5 mg/dL (ref 0.3–1.2)
Total Protein: 6.7 g/dL (ref 6.0–8.3)

## 2009-04-30 LAB — LACTATE DEHYDROGENASE: LDH: 194 U/L (ref 94–250)

## 2009-05-31 ENCOUNTER — Ambulatory Visit: Payer: Self-pay | Admitting: Internal Medicine

## 2009-06-04 LAB — LACTATE DEHYDROGENASE: LDH: 190 U/L (ref 94–250)

## 2009-06-04 LAB — CBC WITH DIFFERENTIAL/PLATELET
BASO%: 0.7 % (ref 0.0–2.0)
Basophils Absolute: 0 10*3/uL (ref 0.0–0.1)
HCT: 39 % (ref 38.4–49.9)
HGB: 13.6 g/dL (ref 13.0–17.1)
LYMPH%: 39.8 % (ref 14.0–49.0)
MCHC: 34.9 g/dL (ref 32.0–36.0)
MONO#: 0.4 10*3/uL (ref 0.1–0.9)
NEUT%: 39 % (ref 39.0–75.0)
Platelets: 262 10*3/uL (ref 140–400)
WBC: 1.9 10*3/uL — ABNORMAL LOW (ref 4.0–10.3)

## 2009-06-04 LAB — COMPREHENSIVE METABOLIC PANEL
ALT: 20 U/L (ref 0–53)
BUN: 19 mg/dL (ref 6–23)
CO2: 24 mEq/L (ref 19–32)
Creatinine, Ser: 1.16 mg/dL (ref 0.40–1.50)
Glucose, Bld: 178 mg/dL — ABNORMAL HIGH (ref 70–99)
Total Bilirubin: 0.6 mg/dL (ref 0.3–1.2)

## 2009-06-11 LAB — CBC WITH DIFFERENTIAL/PLATELET
Basophils Absolute: 0 10*3/uL (ref 0.0–0.1)
Eosinophils Absolute: 0.1 10*3/uL (ref 0.0–0.5)
HCT: 39.9 % (ref 38.4–49.9)
HGB: 13.5 g/dL (ref 13.0–17.1)
MONO#: 1.2 10*3/uL — ABNORMAL HIGH (ref 0.1–0.9)
NEUT#: 12.4 10*3/uL — ABNORMAL HIGH (ref 1.5–6.5)
NEUT%: 79.9 % — ABNORMAL HIGH (ref 39.0–75.0)
RDW: 13.5 % (ref 11.0–14.6)
WBC: 15.5 10*3/uL — ABNORMAL HIGH (ref 4.0–10.3)
lymph#: 1.8 10*3/uL (ref 0.9–3.3)

## 2009-06-11 LAB — COMPREHENSIVE METABOLIC PANEL
AST: 28 U/L (ref 0–37)
Albumin: 3.9 g/dL (ref 3.5–5.2)
BUN: 28 mg/dL — ABNORMAL HIGH (ref 6–23)
Calcium: 8.8 mg/dL (ref 8.4–10.5)
Chloride: 105 mEq/L (ref 96–112)
Potassium: 4.2 mEq/L (ref 3.5–5.3)

## 2009-06-18 LAB — COMPREHENSIVE METABOLIC PANEL
ALT: 18 U/L (ref 0–53)
AST: 21 U/L (ref 0–37)
Albumin: 3.9 g/dL (ref 3.5–5.2)
Alkaline Phosphatase: 92 U/L (ref 39–117)
Potassium: 4.2 mEq/L (ref 3.5–5.3)
Sodium: 140 mEq/L (ref 135–145)
Total Protein: 7.7 g/dL (ref 6.0–8.3)

## 2009-06-18 LAB — CBC WITH DIFFERENTIAL/PLATELET
BASO%: 0 % (ref 0.0–2.0)
Basophils Absolute: 0 10*3/uL (ref 0.0–0.1)
EOS%: 2.1 % (ref 0.0–7.0)
HGB: 14.4 g/dL (ref 13.0–17.1)
MCH: 32.7 pg (ref 27.2–33.4)
MCHC: 34.3 g/dL (ref 32.0–36.0)
RBC: 4.41 10*6/uL (ref 4.20–5.82)
RDW: 13.3 % (ref 11.0–14.6)
lymph#: 1.1 10*3/uL (ref 0.9–3.3)
nRBC: 0 % (ref 0–0)

## 2009-06-25 LAB — CBC WITH DIFFERENTIAL/PLATELET
Eosinophils Absolute: 0.1 10*3/uL (ref 0.0–0.5)
HCT: 37.7 % — ABNORMAL LOW (ref 38.4–49.9)
LYMPH%: 13.9 % — ABNORMAL LOW (ref 14.0–49.0)
MCHC: 33.7 g/dL (ref 32.0–36.0)
MCV: 95.7 fL (ref 79.3–98.0)
MONO#: 1.1 10*3/uL — ABNORMAL HIGH (ref 0.1–0.9)
MONO%: 9.4 % (ref 0.0–14.0)
NEUT#: 8.9 10*3/uL — ABNORMAL HIGH (ref 1.5–6.5)
NEUT%: 76.1 % — ABNORMAL HIGH (ref 39.0–75.0)
Platelets: 173 10*3/uL (ref 140–400)
RBC: 3.94 10*6/uL — ABNORMAL LOW (ref 4.20–5.82)
WBC: 11.7 10*3/uL — ABNORMAL HIGH (ref 4.0–10.3)

## 2009-06-25 LAB — COMPREHENSIVE METABOLIC PANEL
BUN: 27 mg/dL — ABNORMAL HIGH (ref 6–23)
CO2: 22 mEq/L (ref 19–32)
Calcium: 9.5 mg/dL (ref 8.4–10.5)
Chloride: 105 mEq/L (ref 96–112)
Creatinine, Ser: 1.19 mg/dL (ref 0.40–1.50)
Glucose, Bld: 100 mg/dL — ABNORMAL HIGH (ref 70–99)
Total Bilirubin: 0.6 mg/dL (ref 0.3–1.2)

## 2009-07-01 ENCOUNTER — Ambulatory Visit: Payer: Self-pay | Admitting: Internal Medicine

## 2009-07-02 LAB — COMPREHENSIVE METABOLIC PANEL
AST: 23 U/L (ref 0–37)
Albumin: 3.7 g/dL (ref 3.5–5.2)
BUN: 30 mg/dL — ABNORMAL HIGH (ref 6–23)
CO2: 23 mEq/L (ref 19–32)
Calcium: 9 mg/dL (ref 8.4–10.5)
Chloride: 103 mEq/L (ref 96–112)
Potassium: 4.2 mEq/L (ref 3.5–5.3)

## 2009-07-02 LAB — CBC WITH DIFFERENTIAL/PLATELET
BASO%: 0.4 % (ref 0.0–2.0)
Basophils Absolute: 0 10*3/uL (ref 0.0–0.1)
Eosinophils Absolute: 0.1 10*3/uL (ref 0.0–0.5)
HCT: 42.7 % (ref 38.4–49.9)
HGB: 14.2 g/dL (ref 13.0–17.1)
MCHC: 33.3 g/dL (ref 32.0–36.0)
MONO#: 0.3 10*3/uL (ref 0.1–0.9)
NEUT#: 1 10*3/uL — ABNORMAL LOW (ref 1.5–6.5)
NEUT%: 44.3 % (ref 39.0–75.0)
Platelets: 184 10*3/uL (ref 140–400)
WBC: 2.3 10*3/uL — ABNORMAL LOW (ref 4.0–10.3)
lymph#: 0.9 10*3/uL (ref 0.9–3.3)

## 2009-07-09 LAB — CBC WITH DIFFERENTIAL/PLATELET
BASO%: 0.2 % (ref 0.0–2.0)
Basophils Absolute: 0 10*3/uL (ref 0.0–0.1)
Eosinophils Absolute: 0.1 10*3/uL (ref 0.0–0.5)
HCT: 39.8 % (ref 38.4–49.9)
HGB: 13.4 g/dL (ref 13.0–17.1)
MONO#: 1.4 10*3/uL — ABNORMAL HIGH (ref 0.1–0.9)
NEUT#: 9.1 10*3/uL — ABNORMAL HIGH (ref 1.5–6.5)
NEUT%: 75.7 % — ABNORMAL HIGH (ref 39.0–75.0)
Platelets: 176 10*3/uL (ref 140–400)
WBC: 12 10*3/uL — ABNORMAL HIGH (ref 4.0–10.3)
lymph#: 1.4 10*3/uL (ref 0.9–3.3)

## 2009-07-09 LAB — COMPREHENSIVE METABOLIC PANEL
AST: 23 U/L (ref 0–37)
Albumin: 4 g/dL (ref 3.5–5.2)
BUN: 26 mg/dL — ABNORMAL HIGH (ref 6–23)
CO2: 23 mEq/L (ref 19–32)
Calcium: 9.4 mg/dL (ref 8.4–10.5)
Chloride: 102 mEq/L (ref 96–112)
Glucose, Bld: 166 mg/dL — ABNORMAL HIGH (ref 70–99)
Potassium: 4.2 mEq/L (ref 3.5–5.3)

## 2009-07-16 LAB — COMPREHENSIVE METABOLIC PANEL
AST: 28 U/L (ref 0–37)
Albumin: 3.6 g/dL (ref 3.5–5.2)
Alkaline Phosphatase: 86 U/L (ref 39–117)
Glucose, Bld: 173 mg/dL — ABNORMAL HIGH (ref 70–99)
Potassium: 4.1 mEq/L (ref 3.5–5.3)
Sodium: 141 mEq/L (ref 135–145)
Total Protein: 7.6 g/dL (ref 6.0–8.3)

## 2009-07-16 LAB — CBC WITH DIFFERENTIAL/PLATELET
BASO%: 0.4 % (ref 0.0–2.0)
Basophils Absolute: 0 10*3/uL (ref 0.0–0.1)
EOS%: 2.6 % (ref 0.0–7.0)
HGB: 14.5 g/dL (ref 13.0–17.1)
MCH: 32.5 pg (ref 27.2–33.4)
RDW: 13.4 % (ref 11.0–14.6)
WBC: 2.7 10*3/uL — ABNORMAL LOW (ref 4.0–10.3)
lymph#: 1.1 10*3/uL (ref 0.9–3.3)

## 2009-07-23 LAB — COMPREHENSIVE METABOLIC PANEL
ALT: 20 U/L (ref 0–53)
Alkaline Phosphatase: 104 U/L (ref 39–117)
Glucose, Bld: 189 mg/dL — ABNORMAL HIGH (ref 70–99)
Sodium: 139 mEq/L (ref 135–145)
Total Bilirubin: 0.4 mg/dL (ref 0.3–1.2)
Total Protein: 7.2 g/dL (ref 6.0–8.3)

## 2009-07-23 LAB — CBC WITH DIFFERENTIAL/PLATELET
EOS%: 1.6 % (ref 0.0–7.0)
MCH: 32.4 pg (ref 27.2–33.4)
MCV: 96 fL (ref 79.3–98.0)
MONO%: 18.3 % — ABNORMAL HIGH (ref 0.0–14.0)
RBC: 4.04 10*6/uL — ABNORMAL LOW (ref 4.20–5.82)
RDW: 13.1 % (ref 11.0–14.6)
nRBC: 0 % (ref 0–0)

## 2009-08-05 ENCOUNTER — Ambulatory Visit: Payer: Self-pay | Admitting: Internal Medicine

## 2009-08-12 LAB — CBC WITH DIFFERENTIAL/PLATELET
BASO%: 0.3 % (ref 0.0–2.0)
Eosinophils Absolute: 0 10*3/uL (ref 0.0–0.5)
HCT: 39.4 % (ref 38.4–49.9)
MCHC: 34.5 g/dL (ref 32.0–36.0)
MONO#: 0.1 10*3/uL (ref 0.1–0.9)
NEUT#: 1.1 10*3/uL — ABNORMAL LOW (ref 1.5–6.5)
RBC: 4.11 10*6/uL — ABNORMAL LOW (ref 4.20–5.82)
WBC: 1.4 10*3/uL — ABNORMAL LOW (ref 4.0–10.3)
lymph#: 0.2 10*3/uL — ABNORMAL LOW (ref 0.9–3.3)

## 2009-08-12 LAB — COMPREHENSIVE METABOLIC PANEL
ALT: 29 U/L (ref 0–53)
Albumin: 3.6 g/dL (ref 3.5–5.2)
CO2: 27 mEq/L (ref 19–32)
Calcium: 10.3 mg/dL (ref 8.4–10.5)
Chloride: 97 mEq/L (ref 96–112)
Sodium: 134 mEq/L — ABNORMAL LOW (ref 135–145)
Total Protein: 8.6 g/dL — ABNORMAL HIGH (ref 6.0–8.3)

## 2009-08-12 LAB — URINALYSIS, MICROSCOPIC - CHCC
Leukocyte Esterase: NEGATIVE
Nitrite: NEGATIVE

## 2009-08-14 LAB — COMPREHENSIVE METABOLIC PANEL
ALT: 22 U/L (ref 0–53)
Albumin: 3.4 g/dL — ABNORMAL LOW (ref 3.5–5.2)
Alkaline Phosphatase: 88 U/L (ref 39–117)
Glucose, Bld: 229 mg/dL — ABNORMAL HIGH (ref 70–99)
Potassium: 3.8 mEq/L (ref 3.5–5.3)
Sodium: 136 mEq/L (ref 135–145)
Total Protein: 8 g/dL (ref 6.0–8.3)

## 2009-08-14 LAB — CBC WITH DIFFERENTIAL/PLATELET
BASO%: 0.2 % (ref 0.0–2.0)
Eosinophils Absolute: 0 10*3/uL (ref 0.0–0.5)
MCHC: 34.9 g/dL (ref 32.0–36.0)
MONO#: 0 10*3/uL — ABNORMAL LOW (ref 0.1–0.9)
MONO%: 2 % (ref 0.0–14.0)
NEUT#: 1.3 10*3/uL — ABNORMAL LOW (ref 1.5–6.5)
RBC: 3.91 10*6/uL — ABNORMAL LOW (ref 4.20–5.82)
RDW: 13 % (ref 11.0–14.6)
WBC: 1.5 10*3/uL — ABNORMAL LOW (ref 4.0–10.3)

## 2009-08-16 LAB — CBC WITH DIFFERENTIAL/PLATELET
HGB: 10.9 g/dL — ABNORMAL LOW (ref 13.0–17.1)
MCH: 31.6 pg (ref 27.2–33.4)
MCHC: 34.7 g/dL (ref 32.0–36.0)
MCV: 91 fL (ref 79.3–98.0)
RBC: 3.45 10*6/uL — ABNORMAL LOW (ref 4.20–5.82)
RDW: 12.3 % (ref 11.0–14.6)
nRBC: 0 % (ref 0–0)

## 2009-08-16 LAB — MANUAL DIFFERENTIAL
Basophil: 6 % — ABNORMAL HIGH (ref 0–2)
EOS: 2 % (ref 0–7)
MONO: 22 % — ABNORMAL HIGH (ref 0–14)
Metamyelocytes: 0 % (ref 0–0)
Myelocytes: 0 % (ref 0–0)
PLT EST: DECREASED
PROMYELO: 0 % (ref 0–0)
SEG: 22 % — ABNORMAL LOW (ref 38–77)

## 2009-08-16 LAB — COMPREHENSIVE METABOLIC PANEL
ALT: 22 U/L (ref 0–53)
Albumin: 3.2 g/dL — ABNORMAL LOW (ref 3.5–5.2)
CO2: 25 mEq/L (ref 19–32)
Chloride: 102 mEq/L (ref 96–112)
Glucose, Bld: 229 mg/dL — ABNORMAL HIGH (ref 70–99)
Potassium: 3.8 mEq/L (ref 3.5–5.3)
Sodium: 134 mEq/L — ABNORMAL LOW (ref 135–145)
Total Bilirubin: 0.9 mg/dL (ref 0.3–1.2)
Total Protein: 7.5 g/dL (ref 6.0–8.3)

## 2009-08-28 LAB — CBC WITH DIFFERENTIAL/PLATELET
BASO%: 0.2 % (ref 0.0–2.0)
Eosinophils Absolute: 0 10*3/uL (ref 0.0–0.5)
HCT: 31.5 % — ABNORMAL LOW (ref 38.4–49.9)
HGB: 10.8 g/dL — ABNORMAL LOW (ref 13.0–17.1)
LYMPH%: 20 % (ref 14.0–49.0)
MCHC: 34.3 g/dL (ref 32.0–36.0)
MONO#: 0.4 10*3/uL (ref 0.1–0.9)
NEUT#: 1.9 10*3/uL (ref 1.5–6.5)
NEUT%: 63.8 % (ref 39.0–75.0)
Platelets: 161 10*3/uL (ref 140–400)
WBC: 2.9 10*3/uL — ABNORMAL LOW (ref 4.0–10.3)
lymph#: 0.6 10*3/uL — ABNORMAL LOW (ref 0.9–3.3)

## 2009-08-28 LAB — COMPREHENSIVE METABOLIC PANEL
ALT: 21 U/L (ref 0–53)
CO2: 25 mEq/L (ref 19–32)
Calcium: 9 mg/dL (ref 8.4–10.5)
Chloride: 105 mEq/L (ref 96–112)
Creatinine, Ser: 2.09 mg/dL — ABNORMAL HIGH (ref 0.40–1.50)
Glucose, Bld: 153 mg/dL — ABNORMAL HIGH (ref 70–99)
Total Protein: 8.2 g/dL (ref 6.0–8.3)

## 2009-10-11 ENCOUNTER — Ambulatory Visit: Payer: Self-pay | Admitting: Internal Medicine

## 2009-10-11 ENCOUNTER — Other Ambulatory Visit: Admission: RE | Admit: 2009-10-11 | Discharge: 2009-10-11 | Payer: Self-pay | Admitting: Internal Medicine

## 2009-11-28 ENCOUNTER — Ambulatory Visit: Payer: Self-pay | Admitting: Internal Medicine

## 2009-12-09 LAB — CBC WITH DIFFERENTIAL/PLATELET
BASO%: 0.6 % (ref 0.0–2.0)
EOS%: 2.3 % (ref 0.0–7.0)
MCH: 34 pg — ABNORMAL HIGH (ref 27.2–33.4)
MCHC: 34.3 g/dL (ref 32.0–36.0)
MCV: 99.3 fL — ABNORMAL HIGH (ref 79.3–98.0)
MONO%: 12.1 % (ref 0.0–14.0)
RBC: 3.43 10*6/uL — ABNORMAL LOW (ref 4.20–5.82)
RDW: 15 % — ABNORMAL HIGH (ref 11.0–14.6)
lymph#: 0.6 10*3/uL — ABNORMAL LOW (ref 0.9–3.3)

## 2009-12-09 LAB — COMPREHENSIVE METABOLIC PANEL
ALT: 17 U/L (ref 0–53)
AST: 26 U/L (ref 0–37)
Albumin: 3.4 g/dL — ABNORMAL LOW (ref 3.5–5.2)
Alkaline Phosphatase: 101 U/L (ref 39–117)
BUN: 33 mg/dL — ABNORMAL HIGH (ref 6–23)
Potassium: 4.4 mEq/L (ref 3.5–5.3)
Sodium: 137 mEq/L (ref 135–145)
Total Protein: 8.5 g/dL — ABNORMAL HIGH (ref 6.0–8.3)

## 2009-12-23 LAB — CBC WITH DIFFERENTIAL/PLATELET
Eosinophils Absolute: 0.1 10*3/uL (ref 0.0–0.5)
LYMPH%: 43.6 % (ref 14.0–49.0)
MCH: 32.6 pg (ref 27.2–33.4)
MCV: 100 fL — ABNORMAL HIGH (ref 79.3–98.0)
MONO%: 14.5 % — ABNORMAL HIGH (ref 0.0–14.0)
NEUT#: 0.7 10*3/uL — ABNORMAL LOW (ref 1.5–6.5)
Platelets: 149 10*3/uL (ref 140–400)
RBC: 3.1 10*6/uL — ABNORMAL LOW (ref 4.20–5.82)

## 2009-12-23 LAB — COMPREHENSIVE METABOLIC PANEL
Alkaline Phosphatase: 85 U/L (ref 39–117)
BUN: 21 mg/dL (ref 6–23)
Glucose, Bld: 129 mg/dL — ABNORMAL HIGH (ref 70–99)
Sodium: 140 mEq/L (ref 135–145)
Total Bilirubin: 0.3 mg/dL (ref 0.3–1.2)
Total Protein: 8.1 g/dL (ref 6.0–8.3)

## 2009-12-26 LAB — CBC WITH DIFFERENTIAL/PLATELET
Eosinophils Absolute: 0.1 10*3/uL (ref 0.0–0.5)
MONO#: 1 10*3/uL — ABNORMAL HIGH (ref 0.1–0.9)
MONO%: 8.2 % (ref 0.0–14.0)
NEUT#: 10 10*3/uL — ABNORMAL HIGH (ref 1.5–6.5)
RBC: 2.95 10*6/uL — ABNORMAL LOW (ref 4.20–5.82)
RDW: 16.3 % — ABNORMAL HIGH (ref 11.0–14.6)
WBC: 11.9 10*3/uL — ABNORMAL HIGH (ref 4.0–10.3)
lymph#: 0.8 10*3/uL — ABNORMAL LOW (ref 0.9–3.3)

## 2009-12-31 ENCOUNTER — Ambulatory Visit: Payer: Self-pay | Admitting: Internal Medicine

## 2010-01-02 LAB — COMPREHENSIVE METABOLIC PANEL
Albumin: 3.2 g/dL — ABNORMAL LOW (ref 3.5–5.2)
CO2: 24 mEq/L (ref 19–32)
Calcium: 8.6 mg/dL (ref 8.4–10.5)
Glucose, Bld: 102 mg/dL — ABNORMAL HIGH (ref 70–99)
Potassium: 4 mEq/L (ref 3.5–5.3)
Sodium: 141 mEq/L (ref 135–145)
Total Bilirubin: 0.4 mg/dL (ref 0.3–1.2)
Total Protein: 7.8 g/dL (ref 6.0–8.3)

## 2010-01-02 LAB — CBC WITH DIFFERENTIAL/PLATELET
BASO%: 0.1 % (ref 0.0–2.0)
MCHC: 32.3 g/dL (ref 32.0–36.0)
MONO#: 1.1 10*3/uL — ABNORMAL HIGH (ref 0.1–0.9)
RBC: 3.26 10*6/uL — ABNORMAL LOW (ref 4.20–5.82)
RDW: 16.2 % — ABNORMAL HIGH (ref 11.0–14.6)
WBC: 20.5 10*3/uL — ABNORMAL HIGH (ref 4.0–10.3)
lymph#: 0.9 10*3/uL (ref 0.9–3.3)
nRBC: 0 % (ref 0–0)

## 2010-01-02 LAB — LACTATE DEHYDROGENASE: LDH: 198 U/L (ref 94–250)

## 2010-01-14 LAB — COMPREHENSIVE METABOLIC PANEL
AST: 26 U/L (ref 0–37)
Albumin: 3.4 g/dL — ABNORMAL LOW (ref 3.5–5.2)
Alkaline Phosphatase: 115 U/L (ref 39–117)
Chloride: 106 mEq/L (ref 96–112)
Glucose, Bld: 168 mg/dL — ABNORMAL HIGH (ref 70–99)
Potassium: 4.1 mEq/L (ref 3.5–5.3)
Sodium: 140 mEq/L (ref 135–145)
Total Protein: 7.5 g/dL (ref 6.0–8.3)

## 2010-01-14 LAB — CBC WITH DIFFERENTIAL/PLATELET
BASO%: 0.2 % (ref 0.0–2.0)
EOS%: 0.6 % (ref 0.0–7.0)
LYMPH%: 11.8 % — ABNORMAL LOW (ref 14.0–49.0)
MCHC: 32.5 g/dL (ref 32.0–36.0)
MCV: 99.7 fL — ABNORMAL HIGH (ref 79.3–98.0)
MONO%: 12.6 % (ref 0.0–14.0)
Platelets: 110 10*3/uL — ABNORMAL LOW (ref 140–400)
RBC: 3.21 10*6/uL — ABNORMAL LOW (ref 4.20–5.82)
nRBC: 0 % (ref 0–0)

## 2010-01-21 LAB — CBC WITH DIFFERENTIAL/PLATELET
Basophils Absolute: 0 10*3/uL (ref 0.0–0.1)
Eosinophils Absolute: 0 10*3/uL (ref 0.0–0.5)
HGB: 11.2 g/dL — ABNORMAL LOW (ref 13.0–17.1)
MCV: 99.1 fL — ABNORMAL HIGH (ref 79.3–98.0)
MONO%: 5.2 % (ref 0.0–14.0)
NEUT#: 13.9 10*3/uL — ABNORMAL HIGH (ref 1.5–6.5)
Platelets: 101 10*3/uL — ABNORMAL LOW (ref 140–400)
RDW: 16.1 % — ABNORMAL HIGH (ref 11.0–14.6)

## 2010-01-21 LAB — COMPREHENSIVE METABOLIC PANEL
AST: 27 U/L (ref 0–37)
Alkaline Phosphatase: 155 U/L — ABNORMAL HIGH (ref 39–117)
BUN: 25 mg/dL — ABNORMAL HIGH (ref 6–23)
Calcium: 8.7 mg/dL (ref 8.4–10.5)
Creatinine, Ser: 1.32 mg/dL (ref 0.40–1.50)

## 2010-01-28 LAB — CBC WITH DIFFERENTIAL/PLATELET
BASO%: 1 % (ref 0.0–2.0)
EOS%: 2.9 % (ref 0.0–7.0)
HCT: 33 % — ABNORMAL LOW (ref 38.4–49.9)
LYMPH%: 55.3 % — ABNORMAL HIGH (ref 14.0–49.0)
MCH: 32.3 pg (ref 27.2–33.4)
MCHC: 33 g/dL (ref 32.0–36.0)
MONO#: 0.2 10*3/uL (ref 0.1–0.9)
NEUT%: 23.3 % — ABNORMAL LOW (ref 39.0–75.0)
Platelets: 100 10*3/uL — ABNORMAL LOW (ref 140–400)
RBC: 3.37 10*6/uL — ABNORMAL LOW (ref 4.20–5.82)
WBC: 1 10*3/uL — ABNORMAL LOW (ref 4.0–10.3)
lymph#: 0.6 10*3/uL — ABNORMAL LOW (ref 0.9–3.3)

## 2010-01-28 LAB — COMPREHENSIVE METABOLIC PANEL
ALT: 24 U/L (ref 0–53)
AST: 26 U/L (ref 0–37)
Alkaline Phosphatase: 134 U/L — ABNORMAL HIGH (ref 39–117)
CO2: 23 mEq/L (ref 19–32)
Creatinine, Ser: 1.28 mg/dL (ref 0.40–1.50)
Sodium: 138 mEq/L (ref 135–145)
Total Bilirubin: 0.5 mg/dL (ref 0.3–1.2)
Total Protein: 6.8 g/dL (ref 6.0–8.3)

## 2010-01-30 ENCOUNTER — Ambulatory Visit: Payer: Self-pay | Admitting: Internal Medicine

## 2010-02-03 LAB — CBC WITH DIFFERENTIAL/PLATELET
BASO%: 0.2 % (ref 0.0–2.0)
EOS%: 0.6 % (ref 0.0–7.0)
HCT: 31.2 % — ABNORMAL LOW (ref 38.4–49.9)
LYMPH%: 17.4 % (ref 14.0–49.0)
MCH: 33.9 pg — ABNORMAL HIGH (ref 27.2–33.4)
MCHC: 33.4 g/dL (ref 32.0–36.0)
MCV: 101.3 fL — ABNORMAL HIGH (ref 79.3–98.0)
MONO#: 0.6 10*3/uL (ref 0.1–0.9)
MONO%: 9.5 % (ref 0.0–14.0)
NEUT%: 72.3 % (ref 39.0–75.0)
Platelets: 130 10*3/uL — ABNORMAL LOW (ref 140–400)
RBC: 3.08 10*6/uL — ABNORMAL LOW (ref 4.20–5.82)

## 2010-02-03 LAB — COMPREHENSIVE METABOLIC PANEL
ALT: 17 U/L (ref 0–53)
Alkaline Phosphatase: 122 U/L — ABNORMAL HIGH (ref 39–117)
CO2: 25 mEq/L (ref 19–32)
Creatinine, Ser: 1.69 mg/dL — ABNORMAL HIGH (ref 0.40–1.50)
Sodium: 142 mEq/L (ref 135–145)
Total Bilirubin: 0.5 mg/dL (ref 0.3–1.2)

## 2010-02-03 LAB — LACTATE DEHYDROGENASE: LDH: 181 U/L (ref 94–250)

## 2010-02-11 LAB — COMPREHENSIVE METABOLIC PANEL
Albumin: 3.3 g/dL — ABNORMAL LOW (ref 3.5–5.2)
BUN: 26 mg/dL — ABNORMAL HIGH (ref 6–23)
Calcium: 9.1 mg/dL (ref 8.4–10.5)
Chloride: 109 mEq/L (ref 96–112)
Glucose, Bld: 140 mg/dL — ABNORMAL HIGH (ref 70–99)
Potassium: 4.5 mEq/L (ref 3.5–5.3)

## 2010-02-11 LAB — CBC WITH DIFFERENTIAL/PLATELET
Basophils Absolute: 0 10*3/uL (ref 0.0–0.1)
EOS%: 0.2 % (ref 0.0–7.0)
HCT: 33.3 % — ABNORMAL LOW (ref 38.4–49.9)
HGB: 10.9 g/dL — ABNORMAL LOW (ref 13.0–17.1)
LYMPH%: 7.2 % — ABNORMAL LOW (ref 14.0–49.0)
MCH: 32.4 pg (ref 27.2–33.4)
MONO#: 1 10*3/uL — ABNORMAL HIGH (ref 0.1–0.9)
NEUT%: 86.2 % — ABNORMAL HIGH (ref 39.0–75.0)
Platelets: 117 10*3/uL — ABNORMAL LOW (ref 140–400)
lymph#: 1.2 10*3/uL (ref 0.9–3.3)

## 2010-02-25 LAB — CBC WITH DIFFERENTIAL/PLATELET
BASO%: 0.2 % (ref 0.0–2.0)
Basophils Absolute: 0 10*3/uL (ref 0.0–0.1)
EOS%: 0.4 % (ref 0.0–7.0)
LYMPH%: 10.5 % — ABNORMAL LOW (ref 14.0–49.0)
MCHC: 32.1 g/dL (ref 32.0–36.0)
NEUT%: 73 % (ref 39.0–75.0)
WBC: 10.6 10*3/uL — ABNORMAL HIGH (ref 4.0–10.3)
lymph#: 1.1 10*3/uL (ref 0.9–3.3)

## 2010-02-25 LAB — COMPREHENSIVE METABOLIC PANEL
ALT: 26 U/L (ref 0–53)
AST: 35 U/L (ref 0–37)
Albumin: 3.5 g/dL (ref 3.5–5.2)
BUN: 20 mg/dL (ref 6–23)
CO2: 25 mEq/L (ref 19–32)
Chloride: 110 mEq/L (ref 96–112)
Glucose, Bld: 96 mg/dL (ref 70–99)
Potassium: 4.4 mEq/L (ref 3.5–5.3)
Sodium: 142 mEq/L (ref 135–145)

## 2010-02-25 LAB — TECHNOLOGIST REVIEW

## 2010-03-04 ENCOUNTER — Ambulatory Visit (HOSPITAL_BASED_OUTPATIENT_CLINIC_OR_DEPARTMENT_OTHER): Payer: 59 | Admitting: Internal Medicine

## 2010-03-04 LAB — COMPREHENSIVE METABOLIC PANEL
ALT: 22 U/L (ref 0–53)
AST: 28 U/L (ref 0–37)
Albumin: 3.8 g/dL (ref 3.5–5.2)
Alkaline Phosphatase: 150 U/L — ABNORMAL HIGH (ref 39–117)
BUN: 34 mg/dL — ABNORMAL HIGH (ref 6–23)
CO2: 22 mEq/L (ref 19–32)
Calcium: 8.5 mg/dL (ref 8.4–10.5)
Chloride: 105 mEq/L (ref 96–112)
Creatinine, Ser: 1.53 mg/dL — ABNORMAL HIGH (ref 0.40–1.50)
Glucose, Bld: 116 mg/dL — ABNORMAL HIGH (ref 70–99)
Potassium: 3.9 mEq/L (ref 3.5–5.3)
Sodium: 138 mEq/L (ref 135–145)
Total Bilirubin: 0.5 mg/dL (ref 0.3–1.2)
Total Protein: 6.7 g/dL (ref 6.0–8.3)

## 2010-03-04 LAB — CBC WITH DIFFERENTIAL/PLATELET
BASO%: 0.1 % (ref 0.0–2.0)
Basophils Absolute: 0 10*3/uL (ref 0.0–0.1)
EOS%: 0.2 % (ref 0.0–7.0)
Eosinophils Absolute: 0 10*3/uL (ref 0.0–0.5)
HCT: 32.3 % — ABNORMAL LOW (ref 38.4–49.9)
HGB: 10.9 g/dL — ABNORMAL LOW (ref 13.0–17.1)
LYMPH%: 5.7 % — ABNORMAL LOW (ref 14.0–49.0)
MCH: 33.8 pg — ABNORMAL HIGH (ref 27.2–33.4)
MCHC: 33.7 g/dL (ref 32.0–36.0)
MCV: 100.5 fL — ABNORMAL HIGH (ref 79.3–98.0)
MONO#: 0.4 10*3/uL (ref 0.1–0.9)
MONO%: 2.3 % (ref 0.0–14.0)
NEUT#: 16.2 10*3/uL — ABNORMAL HIGH (ref 1.5–6.5)
NEUT%: 91.7 % — ABNORMAL HIGH (ref 39.0–75.0)
Platelets: 145 10*3/uL (ref 140–400)
RBC: 3.22 10*6/uL — ABNORMAL LOW (ref 4.20–5.82)
RDW: 15.9 % — ABNORMAL HIGH (ref 11.0–14.6)
WBC: 17.6 10*3/uL — ABNORMAL HIGH (ref 4.0–10.3)
lymph#: 1 10*3/uL (ref 0.9–3.3)

## 2010-03-16 ENCOUNTER — Encounter: Payer: Self-pay | Admitting: Interventional Radiology

## 2010-03-16 ENCOUNTER — Encounter: Payer: Self-pay | Admitting: Internal Medicine

## 2010-03-18 LAB — CBC WITH DIFFERENTIAL/PLATELET
BASO%: 0.2 % (ref 0.0–2.0)
Basophils Absolute: 0 10*3/uL (ref 0.0–0.1)
EOS%: 0.5 % (ref 0.0–7.0)
Eosinophils Absolute: 0.1 10*3/uL (ref 0.0–0.5)
HGB: 11.6 g/dL — ABNORMAL LOW (ref 13.0–17.1)
LYMPH%: 11.2 % — ABNORMAL LOW (ref 14.0–49.0)
MCH: 32.3 pg (ref 27.2–33.4)
MCHC: 32.4 g/dL (ref 32.0–36.0)
MCV: 99.7 fL — ABNORMAL HIGH (ref 79.3–98.0)
MONO%: 13.1 % (ref 0.0–14.0)
NEUT%: 75 % (ref 39.0–75.0)
RBC: 3.59 10*6/uL — ABNORMAL LOW (ref 4.20–5.82)
nRBC: 0 % (ref 0–0)

## 2010-03-18 LAB — TECHNOLOGIST REVIEW

## 2010-03-19 LAB — COMPREHENSIVE METABOLIC PANEL
AST: 27 U/L (ref 0–37)
BUN: 18 mg/dL (ref 6–23)
CO2: 24 mEq/L (ref 19–32)
Creatinine, Ser: 1.3 mg/dL (ref 0.40–1.50)
Sodium: 139 mEq/L (ref 135–145)

## 2010-03-19 LAB — BETA 2 MICROGLOBULIN, SERUM: Beta-2 Microglobulin: 4.99 mg/L — ABNORMAL HIGH (ref 1.01–1.73)

## 2010-03-19 LAB — IGG, IGA, IGM
IgA: 768 mg/dL — ABNORMAL HIGH (ref 68–378)
IgG (Immunoglobin G), Serum: 785 mg/dL (ref 694–1618)

## 2010-03-19 LAB — KAPPA/LAMBDA LIGHT CHAINS: Lambda Free Lght Chn: 30.9 mg/dL — ABNORMAL HIGH (ref 0.57–2.63)

## 2010-03-25 LAB — COMPREHENSIVE METABOLIC PANEL
AST: 27 U/L (ref 0–37)
Alkaline Phosphatase: 149 U/L — ABNORMAL HIGH (ref 39–117)
BUN: 26 mg/dL — ABNORMAL HIGH (ref 6–23)
Creatinine, Ser: 1.41 mg/dL (ref 0.40–1.50)
Glucose, Bld: 112 mg/dL — ABNORMAL HIGH (ref 70–99)

## 2010-03-25 LAB — CBC WITH DIFFERENTIAL/PLATELET
Eosinophils Absolute: 0 10*3/uL (ref 0.0–0.5)
HCT: 35.7 % — ABNORMAL LOW (ref 38.4–49.9)
LYMPH%: 6.1 % — ABNORMAL LOW (ref 14.0–49.0)
MONO#: 1 10*3/uL — ABNORMAL HIGH (ref 0.1–0.9)
NEUT#: 20.2 10*3/uL — ABNORMAL HIGH (ref 1.5–6.5)
NEUT%: 89.2 % — ABNORMAL HIGH (ref 39.0–75.0)
Platelets: 102 10*3/uL — ABNORMAL LOW (ref 140–400)
WBC: 22.7 10*3/uL — ABNORMAL HIGH (ref 4.0–10.3)

## 2010-04-07 ENCOUNTER — Encounter (HOSPITAL_BASED_OUTPATIENT_CLINIC_OR_DEPARTMENT_OTHER): Payer: 59 | Admitting: Internal Medicine

## 2010-04-07 ENCOUNTER — Encounter: Payer: Self-pay | Admitting: Internal Medicine

## 2010-04-07 DIAGNOSIS — Z5112 Encounter for antineoplastic immunotherapy: Secondary | ICD-10-CM

## 2010-04-07 DIAGNOSIS — C9000 Multiple myeloma not having achieved remission: Secondary | ICD-10-CM

## 2010-04-07 LAB — CBC WITH DIFFERENTIAL/PLATELET
Basophils Absolute: 0 10*3/uL (ref 0.0–0.1)
EOS%: 0.5 % (ref 0.0–7.0)
Eosinophils Absolute: 0 10*3/uL (ref 0.0–0.5)
HCT: 33.8 % — ABNORMAL LOW (ref 38.4–49.9)
HGB: 11.5 g/dL — ABNORMAL LOW (ref 13.0–17.1)
MCH: 33.9 pg — ABNORMAL HIGH (ref 27.2–33.4)
MCV: 99.4 fL — ABNORMAL HIGH (ref 79.3–98.0)
MONO%: 6.1 % (ref 0.0–14.0)
NEUT#: 8.9 10*3/uL — ABNORMAL HIGH (ref 1.5–6.5)
NEUT%: 85.4 % — ABNORMAL HIGH (ref 39.0–75.0)
lymph#: 0.8 10*3/uL — ABNORMAL LOW (ref 0.9–3.3)

## 2010-04-07 LAB — COMPREHENSIVE METABOLIC PANEL
AST: 23 U/L (ref 0–37)
Albumin: 3.5 g/dL (ref 3.5–5.2)
BUN: 23 mg/dL (ref 6–23)
Calcium: 8.9 mg/dL (ref 8.4–10.5)
Chloride: 105 mEq/L (ref 96–112)
Creatinine, Ser: 1.41 mg/dL (ref 0.40–1.50)
Glucose, Bld: 235 mg/dL — ABNORMAL HIGH (ref 70–99)
Potassium: 4 mEq/L (ref 3.5–5.3)

## 2010-04-09 ENCOUNTER — Ambulatory Visit (HOSPITAL_COMMUNITY): Payer: 59 | Attending: Internal Medicine

## 2010-04-09 ENCOUNTER — Encounter: Payer: Self-pay | Admitting: Cardiology

## 2010-04-09 DIAGNOSIS — C9000 Multiple myeloma not having achieved remission: Secondary | ICD-10-CM | POA: Insufficient documentation

## 2010-04-09 DIAGNOSIS — Z01810 Encounter for preprocedural cardiovascular examination: Secondary | ICD-10-CM | POA: Insufficient documentation

## 2010-04-09 DIAGNOSIS — I428 Other cardiomyopathies: Secondary | ICD-10-CM

## 2010-04-15 ENCOUNTER — Other Ambulatory Visit: Payer: Self-pay | Admitting: Internal Medicine

## 2010-04-15 ENCOUNTER — Encounter (HOSPITAL_BASED_OUTPATIENT_CLINIC_OR_DEPARTMENT_OTHER): Payer: 59 | Admitting: Internal Medicine

## 2010-04-15 DIAGNOSIS — C9000 Multiple myeloma not having achieved remission: Secondary | ICD-10-CM

## 2010-04-15 DIAGNOSIS — Z5112 Encounter for antineoplastic immunotherapy: Secondary | ICD-10-CM

## 2010-04-15 LAB — CBC WITH DIFFERENTIAL/PLATELET
Basophils Absolute: 0 10*3/uL (ref 0.0–0.1)
Eosinophils Absolute: 0 10*3/uL (ref 0.0–0.5)
HGB: 12.1 g/dL — ABNORMAL LOW (ref 13.0–17.1)
MCV: 97.6 fL (ref 79.3–98.0)
MONO#: 0.7 10*3/uL (ref 0.1–0.9)
NEUT#: 16.9 10*3/uL — ABNORMAL HIGH (ref 1.5–6.5)
RBC: 3.77 10*6/uL — ABNORMAL LOW (ref 4.20–5.82)
RDW: 14.7 % — ABNORMAL HIGH (ref 11.0–14.6)
WBC: 19.3 10*3/uL — ABNORMAL HIGH (ref 4.0–10.3)
lymph#: 1.7 10*3/uL (ref 0.9–3.3)
nRBC: 0 % (ref 0–0)

## 2010-04-15 LAB — COMPREHENSIVE METABOLIC PANEL
AST: 24 U/L (ref 0–37)
Albumin: 4 g/dL (ref 3.5–5.2)
Alkaline Phosphatase: 128 U/L — ABNORMAL HIGH (ref 39–117)
Glucose, Bld: 199 mg/dL — ABNORMAL HIGH (ref 70–99)
Potassium: 4.6 mEq/L (ref 3.5–5.3)
Sodium: 139 mEq/L (ref 135–145)
Total Bilirubin: 0.4 mg/dL (ref 0.3–1.2)
Total Protein: 7.1 g/dL (ref 6.0–8.3)

## 2010-04-18 ENCOUNTER — Other Ambulatory Visit: Payer: Self-pay | Admitting: Internal Medicine

## 2010-04-18 ENCOUNTER — Encounter (HOSPITAL_BASED_OUTPATIENT_CLINIC_OR_DEPARTMENT_OTHER): Payer: 59 | Admitting: Internal Medicine

## 2010-04-18 DIAGNOSIS — C9000 Multiple myeloma not having achieved remission: Secondary | ICD-10-CM

## 2010-04-18 DIAGNOSIS — Z5112 Encounter for antineoplastic immunotherapy: Secondary | ICD-10-CM

## 2010-04-18 DIAGNOSIS — Z5111 Encounter for antineoplastic chemotherapy: Secondary | ICD-10-CM

## 2010-04-18 LAB — COMPREHENSIVE METABOLIC PANEL
ALT: 18 U/L (ref 0–53)
AST: 23 U/L (ref 0–37)
Alkaline Phosphatase: 127 U/L — ABNORMAL HIGH (ref 39–117)
BUN: 27 mg/dL — ABNORMAL HIGH (ref 6–23)
Chloride: 105 mEq/L (ref 96–112)
Creatinine, Ser: 1.55 mg/dL — ABNORMAL HIGH (ref 0.40–1.50)

## 2010-04-18 LAB — CBC WITH DIFFERENTIAL/PLATELET
BASO%: 0.3 % (ref 0.0–2.0)
Basophils Absolute: 0 10*3/uL (ref 0.0–0.1)
EOS%: 0.8 % (ref 0.0–7.0)
HCT: 38.6 % (ref 38.4–49.9)
HGB: 12.9 g/dL — ABNORMAL LOW (ref 13.0–17.1)
MCH: 32.1 pg (ref 27.2–33.4)
MCHC: 33.4 g/dL (ref 32.0–36.0)
MCV: 96 fL (ref 79.3–98.0)
MONO%: 11 % (ref 0.0–14.0)
NEUT%: 67.2 % (ref 39.0–75.0)
RDW: 13.8 % (ref 11.0–14.6)
lymph#: 0.8 10*3/uL — ABNORMAL LOW (ref 0.9–3.3)

## 2010-04-22 ENCOUNTER — Other Ambulatory Visit: Payer: Self-pay | Admitting: Internal Medicine

## 2010-04-22 ENCOUNTER — Encounter (HOSPITAL_BASED_OUTPATIENT_CLINIC_OR_DEPARTMENT_OTHER): Payer: 59 | Admitting: Internal Medicine

## 2010-04-22 DIAGNOSIS — C9000 Multiple myeloma not having achieved remission: Secondary | ICD-10-CM

## 2010-04-22 DIAGNOSIS — Z5112 Encounter for antineoplastic immunotherapy: Secondary | ICD-10-CM

## 2010-04-22 LAB — CBC WITH DIFFERENTIAL/PLATELET
Basophils Absolute: 0 10*3/uL (ref 0.0–0.1)
EOS%: 0.1 % (ref 0.0–7.0)
Eosinophils Absolute: 0 10*3/uL (ref 0.0–0.5)
HCT: 37.8 % — ABNORMAL LOW (ref 38.4–49.9)
HGB: 12.7 g/dL — ABNORMAL LOW (ref 13.0–17.1)
LYMPH%: 6.2 % — ABNORMAL LOW (ref 14.0–49.0)
MCH: 32.5 pg (ref 27.2–33.4)
MCV: 96.7 fL (ref 79.3–98.0)
MONO%: 8.4 % (ref 0.0–14.0)
NEUT#: 21.4 10*3/uL — ABNORMAL HIGH (ref 1.5–6.5)
NEUT%: 85.2 % — ABNORMAL HIGH (ref 39.0–75.0)
Platelets: 96 10*3/uL — ABNORMAL LOW (ref 140–400)

## 2010-04-22 LAB — COMPREHENSIVE METABOLIC PANEL
BUN: 30 mg/dL — ABNORMAL HIGH (ref 6–23)
CO2: 23 mEq/L (ref 19–32)
Creatinine, Ser: 1.54 mg/dL — ABNORMAL HIGH (ref 0.40–1.50)
Glucose, Bld: 264 mg/dL — ABNORMAL HIGH (ref 70–99)
Sodium: 136 mEq/L (ref 135–145)
Total Bilirubin: 0.4 mg/dL (ref 0.3–1.2)
Total Protein: 6.8 g/dL (ref 6.0–8.3)

## 2010-04-25 ENCOUNTER — Other Ambulatory Visit: Payer: Self-pay | Admitting: Internal Medicine

## 2010-04-25 ENCOUNTER — Encounter (HOSPITAL_BASED_OUTPATIENT_CLINIC_OR_DEPARTMENT_OTHER): Payer: 59 | Admitting: Internal Medicine

## 2010-04-25 DIAGNOSIS — C9000 Multiple myeloma not having achieved remission: Secondary | ICD-10-CM

## 2010-04-25 DIAGNOSIS — Z5112 Encounter for antineoplastic immunotherapy: Secondary | ICD-10-CM

## 2010-04-25 LAB — CBC WITH DIFFERENTIAL/PLATELET
Basophils Absolute: 0 10*3/uL (ref 0.0–0.1)
Eosinophils Absolute: 0 10*3/uL (ref 0.0–0.5)
HGB: 12.8 g/dL — ABNORMAL LOW (ref 13.0–17.1)
LYMPH%: 26.7 % (ref 14.0–49.0)
MONO#: 0.7 10*3/uL (ref 0.1–0.9)
NEUT#: 1.3 10*3/uL — ABNORMAL LOW (ref 1.5–6.5)
Platelets: 67 10*3/uL — ABNORMAL LOW (ref 140–400)
RBC: 4.01 10*6/uL — ABNORMAL LOW (ref 4.20–5.82)
WBC: 2.7 10*3/uL — ABNORMAL LOW (ref 4.0–10.3)
nRBC: 0 % (ref 0–0)

## 2010-04-25 LAB — COMPREHENSIVE METABOLIC PANEL
ALT: 26 U/L (ref 0–53)
CO2: 26 mEq/L (ref 19–32)
Calcium: 8.4 mg/dL (ref 8.4–10.5)
Chloride: 106 mEq/L (ref 96–112)
Sodium: 142 mEq/L (ref 135–145)
Total Protein: 7.3 g/dL (ref 6.0–8.3)

## 2010-04-25 LAB — LACTATE DEHYDROGENASE: LDH: 261 U/L — ABNORMAL HIGH (ref 94–250)

## 2010-05-06 ENCOUNTER — Other Ambulatory Visit: Payer: Self-pay | Admitting: Internal Medicine

## 2010-05-06 ENCOUNTER — Encounter (HOSPITAL_BASED_OUTPATIENT_CLINIC_OR_DEPARTMENT_OTHER): Payer: 59 | Admitting: Internal Medicine

## 2010-05-06 DIAGNOSIS — C9000 Multiple myeloma not having achieved remission: Secondary | ICD-10-CM

## 2010-05-06 DIAGNOSIS — Z5112 Encounter for antineoplastic immunotherapy: Secondary | ICD-10-CM

## 2010-05-06 LAB — CBC WITH DIFFERENTIAL/PLATELET
BASO%: 0.2 % (ref 0.0–2.0)
EOS%: 0.8 % (ref 0.0–7.0)
MCH: 31.5 pg (ref 27.2–33.4)
MCHC: 32.3 g/dL (ref 32.0–36.0)
RBC: 3.55 10*6/uL — ABNORMAL LOW (ref 4.20–5.82)
RDW: 14.4 % (ref 11.0–14.6)
lymph#: 0.9 10*3/uL (ref 0.9–3.3)
nRBC: 0 % (ref 0–0)

## 2010-05-06 LAB — COMPREHENSIVE METABOLIC PANEL
Albumin: 4.2 g/dL (ref 3.5–5.2)
Alkaline Phosphatase: 127 U/L — ABNORMAL HIGH (ref 39–117)
CO2: 22 mEq/L (ref 19–32)
Glucose, Bld: 96 mg/dL (ref 70–99)
Potassium: 4.2 mEq/L (ref 3.5–5.3)
Sodium: 144 mEq/L (ref 135–145)
Total Protein: 6.9 g/dL (ref 6.0–8.3)

## 2010-05-08 LAB — CBC
MCH: 33.8 pg (ref 26.0–34.0)
MCHC: 34.5 g/dL (ref 30.0–36.0)
MCV: 97.9 fL (ref 78.0–100.0)
Platelets: 206 10*3/uL (ref 150–400)
RDW: 15.4 % (ref 11.5–15.5)
WBC: 2.3 10*3/uL — ABNORMAL LOW (ref 4.0–10.5)

## 2010-05-08 LAB — DIFFERENTIAL
Basophils Absolute: 0 10*3/uL (ref 0.0–0.1)
Eosinophils Absolute: 0.1 10*3/uL (ref 0.0–0.7)
Lymphocytes Relative: 31 % (ref 12–46)
Lymphs Abs: 0.7 10*3/uL (ref 0.7–4.0)
Monocytes Absolute: 0.5 10*3/uL (ref 0.1–1.0)
Neutro Abs: 1 10*3/uL — ABNORMAL LOW (ref 1.7–7.7)

## 2010-05-09 ENCOUNTER — Other Ambulatory Visit: Payer: Self-pay | Admitting: Internal Medicine

## 2010-05-09 ENCOUNTER — Encounter (HOSPITAL_BASED_OUTPATIENT_CLINIC_OR_DEPARTMENT_OTHER): Payer: 59 | Admitting: Internal Medicine

## 2010-05-09 DIAGNOSIS — C9000 Multiple myeloma not having achieved remission: Secondary | ICD-10-CM

## 2010-05-09 DIAGNOSIS — Z5112 Encounter for antineoplastic immunotherapy: Secondary | ICD-10-CM

## 2010-05-09 DIAGNOSIS — Z5111 Encounter for antineoplastic chemotherapy: Secondary | ICD-10-CM

## 2010-05-09 LAB — CBC WITH DIFFERENTIAL/PLATELET
Basophils Absolute: 0 10*3/uL (ref 0.0–0.1)
Eosinophils Absolute: 0 10*3/uL (ref 0.0–0.5)
HCT: 36.7 % — ABNORMAL LOW (ref 38.4–49.9)
HGB: 12.1 g/dL — ABNORMAL LOW (ref 13.0–17.1)
LYMPH%: 24.4 % (ref 14.0–49.0)
MCV: 97.6 fL (ref 79.3–98.0)
MONO%: 21.3 % — ABNORMAL HIGH (ref 0.0–14.0)
NEUT#: 3 10*3/uL (ref 1.5–6.5)
NEUT%: 53.4 % (ref 39.0–75.0)
Platelets: 146 10*3/uL (ref 140–400)

## 2010-05-09 LAB — COMPREHENSIVE METABOLIC PANEL
CO2: 26 mEq/L (ref 19–32)
Creatinine, Ser: 2.4 mg/dL — ABNORMAL HIGH (ref 0.40–1.50)
Glucose, Bld: 118 mg/dL — ABNORMAL HIGH (ref 70–99)
Sodium: 141 mEq/L (ref 135–145)
Total Bilirubin: 0.4 mg/dL (ref 0.3–1.2)
Total Protein: 7.4 g/dL (ref 6.0–8.3)

## 2010-05-09 LAB — LACTATE DEHYDROGENASE: LDH: 255 U/L — ABNORMAL HIGH (ref 94–250)

## 2010-05-13 ENCOUNTER — Encounter (HOSPITAL_BASED_OUTPATIENT_CLINIC_OR_DEPARTMENT_OTHER): Payer: 59 | Admitting: Internal Medicine

## 2010-05-13 ENCOUNTER — Other Ambulatory Visit: Payer: Self-pay | Admitting: Internal Medicine

## 2010-05-13 DIAGNOSIS — Z5112 Encounter for antineoplastic immunotherapy: Secondary | ICD-10-CM

## 2010-05-13 DIAGNOSIS — C9000 Multiple myeloma not having achieved remission: Secondary | ICD-10-CM

## 2010-05-13 LAB — COMPREHENSIVE METABOLIC PANEL
ALT: 23 U/L (ref 0–53)
AST: 32 U/L (ref 0–37)
Albumin: 4 g/dL (ref 3.5–5.2)
Calcium: 9.2 mg/dL (ref 8.4–10.5)
Chloride: 105 mEq/L (ref 96–112)
Potassium: 4.4 mEq/L (ref 3.5–5.3)
Sodium: 141 mEq/L (ref 135–145)
Total Protein: 6.9 g/dL (ref 6.0–8.3)

## 2010-05-13 LAB — CBC WITH DIFFERENTIAL/PLATELET
BASO%: 1 % (ref 0.0–2.0)
HCT: 36.1 % — ABNORMAL LOW (ref 38.4–49.9)
MCHC: 34.3 g/dL (ref 32.0–36.0)
MONO#: 1.3 10*3/uL — ABNORMAL HIGH (ref 0.1–0.9)
NEUT%: 92.7 % — ABNORMAL HIGH (ref 39.0–75.0)
RBC: 3.67 10*6/uL — ABNORMAL LOW (ref 4.20–5.82)
WBC: 44.4 10*3/uL — ABNORMAL HIGH (ref 4.0–10.3)
lymph#: 1.4 10*3/uL (ref 0.9–3.3)

## 2010-05-13 LAB — TECHNOLOGIST REVIEW

## 2010-05-14 LAB — DIFFERENTIAL
Basophils Relative: 0 % (ref 0–1)
Eosinophils Absolute: 0 10*3/uL (ref 0.0–0.7)
Eosinophils Relative: 1 % (ref 0–5)

## 2010-05-14 LAB — CBC
MCHC: 34.6 g/dL (ref 30.0–36.0)
MCV: 100.1 fL — ABNORMAL HIGH (ref 78.0–100.0)
Platelets: 162 10*3/uL (ref 150–400)

## 2010-05-14 LAB — CHROMOSOME ANALYSIS, BONE MARROW

## 2010-05-20 ENCOUNTER — Other Ambulatory Visit: Payer: Self-pay | Admitting: Internal Medicine

## 2010-05-20 ENCOUNTER — Encounter (HOSPITAL_BASED_OUTPATIENT_CLINIC_OR_DEPARTMENT_OTHER): Payer: 59 | Admitting: Internal Medicine

## 2010-05-20 DIAGNOSIS — D649 Anemia, unspecified: Secondary | ICD-10-CM

## 2010-05-20 LAB — CBC WITH DIFFERENTIAL/PLATELET
Basophils Absolute: 0 10*3/uL (ref 0.0–0.1)
Eosinophils Absolute: 0 10*3/uL (ref 0.0–0.5)
HGB: 11.6 g/dL — ABNORMAL LOW (ref 13.0–17.1)
LYMPH%: 36.2 % (ref 14.0–49.0)
MONO#: 0.5 10*3/uL (ref 0.1–0.9)
NEUT#: 0.9 10*3/uL — ABNORMAL LOW (ref 1.5–6.5)
Platelets: 97 10*3/uL — ABNORMAL LOW (ref 140–400)
RBC: 3.63 10*6/uL — ABNORMAL LOW (ref 4.20–5.82)
WBC: 2.2 10*3/uL — ABNORMAL LOW (ref 4.0–10.3)

## 2010-05-21 LAB — COMPREHENSIVE METABOLIC PANEL
Albumin: 4 g/dL (ref 3.5–5.2)
BUN: 28 mg/dL — ABNORMAL HIGH (ref 6–23)
CO2: 22 mEq/L (ref 19–32)
Glucose, Bld: 97 mg/dL (ref 70–99)
Potassium: 4.5 mEq/L (ref 3.5–5.3)
Sodium: 142 mEq/L (ref 135–145)
Total Bilirubin: 0.5 mg/dL (ref 0.3–1.2)
Total Protein: 6.5 g/dL (ref 6.0–8.3)

## 2010-05-21 LAB — LACTATE DEHYDROGENASE: LDH: 283 U/L — ABNORMAL HIGH (ref 94–250)

## 2010-05-21 LAB — IGG, IGA, IGM: IgG (Immunoglobin G), Serum: 795 mg/dL (ref 694–1618)

## 2010-05-27 ENCOUNTER — Other Ambulatory Visit: Payer: Self-pay | Admitting: Internal Medicine

## 2010-05-27 ENCOUNTER — Encounter (HOSPITAL_BASED_OUTPATIENT_CLINIC_OR_DEPARTMENT_OTHER): Payer: 59 | Admitting: Internal Medicine

## 2010-05-27 DIAGNOSIS — C9000 Multiple myeloma not having achieved remission: Secondary | ICD-10-CM

## 2010-05-27 DIAGNOSIS — Z5112 Encounter for antineoplastic immunotherapy: Secondary | ICD-10-CM

## 2010-05-27 LAB — CBC WITH DIFFERENTIAL/PLATELET
Basophils Absolute: 0 10*3/uL (ref 0.0–0.1)
Eosinophils Absolute: 0 10*3/uL (ref 0.0–0.5)
HCT: 36 % — ABNORMAL LOW (ref 38.4–49.9)
HGB: 11.9 g/dL — ABNORMAL LOW (ref 13.0–17.1)
LYMPH%: 3.3 % — ABNORMAL LOW (ref 14.0–49.0)
MONO#: 1.8 10*3/uL — ABNORMAL HIGH (ref 0.1–0.9)
NEUT#: 10.4 10*3/uL — ABNORMAL HIGH (ref 1.5–6.5)
NEUT%: 81.8 % — ABNORMAL HIGH (ref 39.0–75.0)
Platelets: 166 10*3/uL (ref 140–400)
WBC: 12.7 10*3/uL — ABNORMAL HIGH (ref 4.0–10.3)
nRBC: 0 % (ref 0–0)

## 2010-05-27 LAB — COMPREHENSIVE METABOLIC PANEL
ALT: 19 U/L (ref 0–53)
BUN: 27 mg/dL — ABNORMAL HIGH (ref 6–23)
CO2: 21 mEq/L (ref 19–32)
Calcium: 8.6 mg/dL (ref 8.4–10.5)
Chloride: 106 mEq/L (ref 96–112)
Creatinine, Ser: 1.41 mg/dL (ref 0.40–1.50)

## 2010-05-27 LAB — LACTATE DEHYDROGENASE: LDH: 246 U/L (ref 94–250)

## 2010-05-30 ENCOUNTER — Other Ambulatory Visit: Payer: Self-pay | Admitting: Internal Medicine

## 2010-05-30 ENCOUNTER — Encounter (HOSPITAL_BASED_OUTPATIENT_CLINIC_OR_DEPARTMENT_OTHER): Payer: 59 | Admitting: Internal Medicine

## 2010-05-30 DIAGNOSIS — Z5112 Encounter for antineoplastic immunotherapy: Secondary | ICD-10-CM

## 2010-05-30 DIAGNOSIS — Z5111 Encounter for antineoplastic chemotherapy: Secondary | ICD-10-CM

## 2010-05-30 DIAGNOSIS — C9000 Multiple myeloma not having achieved remission: Secondary | ICD-10-CM

## 2010-05-30 LAB — COMPREHENSIVE METABOLIC PANEL
ALT: 19 U/L (ref 0–53)
AST: 29 U/L (ref 0–37)
Albumin: 4 g/dL (ref 3.5–5.2)
Alkaline Phosphatase: 124 U/L — ABNORMAL HIGH (ref 39–117)
Glucose, Bld: 131 mg/dL — ABNORMAL HIGH (ref 70–99)
Potassium: 4 mEq/L (ref 3.5–5.3)
Sodium: 140 mEq/L (ref 135–145)
Total Protein: 6.7 g/dL (ref 6.0–8.3)

## 2010-05-30 LAB — CBC WITH DIFFERENTIAL/PLATELET
BASO%: 0.4 % (ref 0.0–2.0)
Basophils Absolute: 0 10*3/uL (ref 0.0–0.1)
EOS%: 1.3 % (ref 0.0–7.0)
HGB: 12 g/dL — ABNORMAL LOW (ref 13.0–17.1)
MCH: 31.8 pg (ref 27.2–33.4)
RDW: 14.1 % (ref 11.0–14.6)
lymph#: 0.8 10*3/uL — ABNORMAL LOW (ref 0.9–3.3)

## 2010-05-30 LAB — TECHNOLOGIST REVIEW

## 2010-06-03 ENCOUNTER — Other Ambulatory Visit: Payer: Self-pay | Admitting: Internal Medicine

## 2010-06-03 ENCOUNTER — Encounter (HOSPITAL_BASED_OUTPATIENT_CLINIC_OR_DEPARTMENT_OTHER): Payer: 59 | Admitting: Internal Medicine

## 2010-06-03 DIAGNOSIS — Z5112 Encounter for antineoplastic immunotherapy: Secondary | ICD-10-CM

## 2010-06-03 DIAGNOSIS — C9000 Multiple myeloma not having achieved remission: Secondary | ICD-10-CM

## 2010-06-03 LAB — COMPREHENSIVE METABOLIC PANEL
Albumin: 4.1 g/dL (ref 3.5–5.2)
Alkaline Phosphatase: 192 U/L — ABNORMAL HIGH (ref 39–117)
BUN: 37 mg/dL — ABNORMAL HIGH (ref 6–23)
CO2: 23 mEq/L (ref 19–32)
Glucose, Bld: 187 mg/dL — ABNORMAL HIGH (ref 70–99)
Potassium: 4.3 mEq/L (ref 3.5–5.3)
Total Bilirubin: 0.4 mg/dL (ref 0.3–1.2)

## 2010-06-03 LAB — CBC WITH DIFFERENTIAL/PLATELET
Basophils Absolute: 0.1 10*3/uL (ref 0.0–0.1)
Eosinophils Absolute: 0.1 10*3/uL (ref 0.0–0.5)
HGB: 13.2 g/dL (ref 13.0–17.1)
LYMPH%: 2.3 % — ABNORMAL LOW (ref 14.0–49.0)
MCV: 98.3 fL — ABNORMAL HIGH (ref 79.3–98.0)
MONO#: 3.2 10*3/uL — ABNORMAL HIGH (ref 0.1–0.9)
MONO%: 6.5 % (ref 0.0–14.0)
NEUT#: 45.4 10*3/uL — ABNORMAL HIGH (ref 1.5–6.5)
Platelets: 170 10*3/uL (ref 140–400)

## 2010-06-06 ENCOUNTER — Encounter (HOSPITAL_BASED_OUTPATIENT_CLINIC_OR_DEPARTMENT_OTHER): Payer: 59 | Admitting: Internal Medicine

## 2010-06-06 ENCOUNTER — Other Ambulatory Visit: Payer: Self-pay | Admitting: Internal Medicine

## 2010-06-06 DIAGNOSIS — C9000 Multiple myeloma not having achieved remission: Secondary | ICD-10-CM

## 2010-06-06 DIAGNOSIS — Z5112 Encounter for antineoplastic immunotherapy: Secondary | ICD-10-CM

## 2010-06-06 LAB — CBC WITH DIFFERENTIAL/PLATELET
BASO%: 0.7 % (ref 0.0–2.0)
EOS%: 1.1 % (ref 0.0–7.0)
HCT: 37.6 % — ABNORMAL LOW (ref 38.4–49.9)
LYMPH%: 10.6 % — ABNORMAL LOW (ref 14.0–49.0)
MCH: 32 pg (ref 27.2–33.4)
MCHC: 33.2 g/dL (ref 32.0–36.0)
MONO#: 1 10*3/uL — ABNORMAL HIGH (ref 0.1–0.9)
NEUT%: 53.3 % (ref 39.0–75.0)
Platelets: 114 10*3/uL — ABNORMAL LOW (ref 140–400)

## 2010-06-06 LAB — LACTATE DEHYDROGENASE: LDH: 321 U/L — ABNORMAL HIGH (ref 94–250)

## 2010-06-06 LAB — COMPREHENSIVE METABOLIC PANEL
ALT: 27 U/L (ref 0–53)
CO2: 23 mEq/L (ref 19–32)
Creatinine, Ser: 1.31 mg/dL (ref 0.40–1.50)
Total Bilirubin: 0.4 mg/dL (ref 0.3–1.2)

## 2010-06-10 LAB — CBC
HCT: 32.9 % — ABNORMAL LOW (ref 39.0–52.0)
Hemoglobin: 11.5 g/dL — ABNORMAL LOW (ref 13.0–17.0)
RBC: 3.27 MIL/uL — ABNORMAL LOW (ref 4.22–5.81)
RDW: 18.2 % — ABNORMAL HIGH (ref 11.5–15.5)

## 2010-06-10 LAB — DIFFERENTIAL
Basophils Absolute: 0 10*3/uL (ref 0.0–0.1)
Basophils Relative: 0 % (ref 0–1)
Eosinophils Absolute: 0 10*3/uL (ref 0.0–0.7)
Neutro Abs: 1.4 10*3/uL — ABNORMAL LOW (ref 1.7–7.7)
Neutrophils Relative %: 64 % (ref 43–77)

## 2010-06-10 LAB — COMPREHENSIVE METABOLIC PANEL
Alkaline Phosphatase: 193 U/L — ABNORMAL HIGH (ref 39–117)
BUN: 23 mg/dL (ref 6–23)
CO2: 26 mEq/L (ref 19–32)
Chloride: 103 mEq/L (ref 96–112)
GFR calc non Af Amer: >60 mL/min (ref >60)
Glucose, Bld: 174 mg/dL — ABNORMAL HIGH (ref 70–99)
Potassium: 4.3 mEq/L (ref 3.5–5.1)
Total Bilirubin: 0.9 mg/dL (ref 0.3–1.2)
Total Protein: 8.4 g/dL — ABNORMAL HIGH (ref 6.0–8.3)

## 2010-06-10 LAB — GLUCOSE, CAPILLARY
Glucose-Capillary: 117 mg/dL — ABNORMAL HIGH (ref 70–99)
Glucose-Capillary: 161 mg/dL — ABNORMAL HIGH (ref 70–99)

## 2010-06-17 ENCOUNTER — Other Ambulatory Visit: Payer: Self-pay | Admitting: Internal Medicine

## 2010-06-17 ENCOUNTER — Encounter (HOSPITAL_BASED_OUTPATIENT_CLINIC_OR_DEPARTMENT_OTHER): Payer: 59 | Admitting: Internal Medicine

## 2010-06-17 DIAGNOSIS — C9000 Multiple myeloma not having achieved remission: Secondary | ICD-10-CM

## 2010-06-17 DIAGNOSIS — Z5112 Encounter for antineoplastic immunotherapy: Secondary | ICD-10-CM

## 2010-06-17 LAB — COMPREHENSIVE METABOLIC PANEL
ALT: 22 U/L (ref 0–53)
AST: 29 U/L (ref 0–37)
Albumin: 4.2 g/dL (ref 3.5–5.2)
Alkaline Phosphatase: 133 U/L — ABNORMAL HIGH (ref 39–117)
Glucose, Bld: 156 mg/dL — ABNORMAL HIGH (ref 70–99)
Potassium: 4.1 mEq/L (ref 3.5–5.3)
Sodium: 143 mEq/L (ref 135–145)
Total Protein: 7.1 g/dL (ref 6.0–8.3)

## 2010-06-17 LAB — CBC WITH DIFFERENTIAL/PLATELET
Basophils Absolute: 0 10*3/uL (ref 0.0–0.1)
EOS%: 0.8 % (ref 0.0–7.0)
HGB: 12.5 g/dL — ABNORMAL LOW (ref 13.0–17.1)
MCH: 31.7 pg (ref 27.2–33.4)
NEUT#: 10.8 10*3/uL — ABNORMAL HIGH (ref 1.5–6.5)
RDW: 14.7 % — ABNORMAL HIGH (ref 11.0–14.6)
lymph#: 1.2 10*3/uL (ref 0.9–3.3)

## 2010-06-20 ENCOUNTER — Other Ambulatory Visit: Payer: Self-pay | Admitting: Internal Medicine

## 2010-06-20 ENCOUNTER — Encounter (HOSPITAL_BASED_OUTPATIENT_CLINIC_OR_DEPARTMENT_OTHER): Payer: 59 | Admitting: Internal Medicine

## 2010-06-20 DIAGNOSIS — Z5112 Encounter for antineoplastic immunotherapy: Secondary | ICD-10-CM

## 2010-06-20 DIAGNOSIS — C9000 Multiple myeloma not having achieved remission: Secondary | ICD-10-CM

## 2010-06-20 DIAGNOSIS — Z5111 Encounter for antineoplastic chemotherapy: Secondary | ICD-10-CM

## 2010-06-20 LAB — CBC WITH DIFFERENTIAL/PLATELET
BASO%: 0.3 % (ref 0.0–2.0)
EOS%: 1.1 % (ref 0.0–7.0)
LYMPH%: 20.4 % (ref 14.0–49.0)
MCH: 32.1 pg (ref 27.2–33.4)
MCHC: 33.1 g/dL (ref 32.0–36.0)
MCV: 97 fL (ref 79.3–98.0)
MONO%: 16.8 % — ABNORMAL HIGH (ref 0.0–14.0)
Platelets: 146 10*3/uL (ref 140–400)
RBC: 3.96 10*6/uL — ABNORMAL LOW (ref 4.20–5.82)
WBC: 6.6 10*3/uL (ref 4.0–10.3)

## 2010-06-20 LAB — TECHNOLOGIST REVIEW

## 2010-06-24 ENCOUNTER — Other Ambulatory Visit: Payer: Self-pay | Admitting: Internal Medicine

## 2010-06-24 ENCOUNTER — Encounter (HOSPITAL_BASED_OUTPATIENT_CLINIC_OR_DEPARTMENT_OTHER): Payer: 59 | Admitting: Internal Medicine

## 2010-06-24 DIAGNOSIS — Z5112 Encounter for antineoplastic immunotherapy: Secondary | ICD-10-CM

## 2010-06-24 DIAGNOSIS — C9000 Multiple myeloma not having achieved remission: Secondary | ICD-10-CM

## 2010-06-24 LAB — COMPREHENSIVE METABOLIC PANEL
AST: 33 U/L (ref 0–37)
Albumin: 4.2 g/dL (ref 3.5–5.2)
Alkaline Phosphatase: 224 U/L — ABNORMAL HIGH (ref 39–117)
BUN: 36 mg/dL — ABNORMAL HIGH (ref 6–23)
Potassium: 4.8 mEq/L (ref 3.5–5.3)
Sodium: 141 mEq/L (ref 135–145)
Total Bilirubin: 0.5 mg/dL (ref 0.3–1.2)
Total Protein: 7 g/dL (ref 6.0–8.3)

## 2010-06-24 LAB — CBC WITH DIFFERENTIAL/PLATELET
BASO%: 0 % (ref 0.0–2.0)
EOS%: 0 % (ref 0.0–7.0)
MCH: 33.6 pg — ABNORMAL HIGH (ref 27.2–33.4)
MCHC: 33.9 g/dL (ref 32.0–36.0)
MCV: 99.1 fL — ABNORMAL HIGH (ref 79.3–98.0)
MONO%: 2.2 % (ref 0.0–14.0)
RDW: 15.4 % — ABNORMAL HIGH (ref 11.0–14.6)
lymph#: 1.3 10*3/uL (ref 0.9–3.3)

## 2010-06-27 ENCOUNTER — Encounter (HOSPITAL_BASED_OUTPATIENT_CLINIC_OR_DEPARTMENT_OTHER): Payer: 59 | Admitting: Internal Medicine

## 2010-06-27 ENCOUNTER — Other Ambulatory Visit: Payer: Self-pay | Admitting: Internal Medicine

## 2010-06-27 DIAGNOSIS — C9000 Multiple myeloma not having achieved remission: Secondary | ICD-10-CM

## 2010-06-27 DIAGNOSIS — Z5112 Encounter for antineoplastic immunotherapy: Secondary | ICD-10-CM

## 2010-06-27 LAB — COMPREHENSIVE METABOLIC PANEL
BUN: 41 mg/dL — ABNORMAL HIGH (ref 6–23)
CO2: 26 mEq/L (ref 19–32)
Calcium: 9.3 mg/dL (ref 8.4–10.5)
Chloride: 104 mEq/L (ref 96–112)
Creatinine, Ser: 1.46 mg/dL (ref 0.40–1.50)
Glucose, Bld: 117 mg/dL — ABNORMAL HIGH (ref 70–99)
Total Bilirubin: 0.3 mg/dL (ref 0.3–1.2)

## 2010-06-27 LAB — CBC WITH DIFFERENTIAL/PLATELET
Basophils Absolute: 0 10*3/uL (ref 0.0–0.1)
Eosinophils Absolute: 0 10*3/uL (ref 0.0–0.5)
HCT: 38.6 % (ref 38.4–49.9)
LYMPH%: 12.9 % — ABNORMAL LOW (ref 14.0–49.0)
MCV: 96.7 fL (ref 79.3–98.0)
MONO#: 0.9 10*3/uL (ref 0.1–0.9)
MONO%: 35 % — ABNORMAL HIGH (ref 0.0–14.0)
NEUT#: 1.3 10*3/uL — ABNORMAL LOW (ref 1.5–6.5)
NEUT%: 50.5 % (ref 39.0–75.0)
Platelets: 82 10*3/uL — ABNORMAL LOW (ref 140–400)
RBC: 3.99 10*6/uL — ABNORMAL LOW (ref 4.20–5.82)
nRBC: 0 % (ref 0–0)

## 2010-06-27 LAB — LACTATE DEHYDROGENASE: LDH: 416 U/L — ABNORMAL HIGH (ref 94–250)

## 2010-07-08 ENCOUNTER — Other Ambulatory Visit: Payer: Self-pay | Admitting: Internal Medicine

## 2010-07-08 ENCOUNTER — Encounter (HOSPITAL_BASED_OUTPATIENT_CLINIC_OR_DEPARTMENT_OTHER): Payer: 59 | Admitting: Internal Medicine

## 2010-07-08 DIAGNOSIS — C9000 Multiple myeloma not having achieved remission: Secondary | ICD-10-CM

## 2010-07-08 DIAGNOSIS — Z5112 Encounter for antineoplastic immunotherapy: Secondary | ICD-10-CM

## 2010-07-08 LAB — CBC WITH DIFFERENTIAL/PLATELET
Basophils Absolute: 0 10*3/uL (ref 0.0–0.1)
EOS%: 0.6 % (ref 0.0–7.0)
HCT: 36.6 % — ABNORMAL LOW (ref 38.4–49.9)
HGB: 12.3 g/dL — ABNORMAL LOW (ref 13.0–17.1)
MCH: 32.1 pg (ref 27.2–33.4)
MONO#: 2.1 10*3/uL — ABNORMAL HIGH (ref 0.1–0.9)
NEUT#: 12.4 10*3/uL — ABNORMAL HIGH (ref 1.5–6.5)
NEUT%: 78.3 % — ABNORMAL HIGH (ref 39.0–75.0)
RDW: 14.8 % — ABNORMAL HIGH (ref 11.0–14.6)
WBC: 15.8 10*3/uL — ABNORMAL HIGH (ref 4.0–10.3)
lymph#: 1.2 10*3/uL (ref 0.9–3.3)

## 2010-07-08 LAB — COMPREHENSIVE METABOLIC PANEL
AST: 28 U/L (ref 0–37)
Albumin: 4.1 g/dL (ref 3.5–5.2)
Alkaline Phosphatase: 151 U/L — ABNORMAL HIGH (ref 39–117)
BUN: 36 mg/dL — ABNORMAL HIGH (ref 6–23)
Calcium: 9.3 mg/dL (ref 8.4–10.5)
Chloride: 107 mEq/L (ref 96–112)
Glucose, Bld: 142 mg/dL — ABNORMAL HIGH (ref 70–99)
Potassium: 4.5 mEq/L (ref 3.5–5.3)
Sodium: 141 mEq/L (ref 135–145)
Total Protein: 6.8 g/dL (ref 6.0–8.3)

## 2010-07-08 NOTE — H&P (Signed)
Rodney Green, Rodney Green               ACCOUNT NO.:  [id number]   MEDICAL RECORD NO.:  [id number]          PATIENT TYPE:  INP   LOCATION:  1421                         FACILITY:  [facility]   PHYSICIAN:  Kela Millin, M.D.DATE OF BIRTH:  July 27, 1945   DATE OF ADMISSION:  01/13/2008  DATE OF DISCHARGE:                              HISTORY & PHYSICAL   PRIMARY CARE PHYSICIAN:  [name]. Polite, M.D.   CHIEF COMPLAINT:  Confusion and weakness.   HISTORY OF PRESENT ILLNESS:  The patient is a 65 year old black male  with past medical history significant for question multiple myeloma,  hepatitis C, diabetes and hypertension who presents with the above  complaints.  It is reported that he began complaining of pain all over  his body especially his back, rib cage and hips about 2 months ago.  He  saw his primary care physician, Dr. Nehemiah Settle, and an x-ray of his spine  was done which showed a questionable fracture and this was followed by  MRI of the lumbar spine on November 7 which showed T12 and L2  compression fractures.  The L2 fracture was healed and there was mild  edema within T12 reflecting an acute to subacute component.  Also  degenerative disk disease at L4-L5, L5-S1 levels with narrowing of the  left L5 foramen without encroachment on the exiting nerve root were  noted.  Following this Dr. Nehemiah Settle ordered an SPEP with quantitative  immunoglobulin which showed normal IgG level of 1180, but elevated IgA  level of 5440 and a low IgM level of 33.  The serum protein  electrophoresis that showed 3 restricted bands consistent with  monoclonal protein.  A CBC was done which showed a hemoglobin of 12.9  and his creatinine elevated at 1.55 and his calcium also elevated at  11.6.  Following these findings the patient was referred to  hematology/oncology, Dr. Arbutus Ped, and he saw him on November 18 and his  impression was that this was most likely multiple myeloma but  recommended to the family  that he needed further investigation to  establish the diagnosis.   A skeletal bone survey was ordered and he was to arrange for a bone  marrow aspirate and biopsy in the week following.  He also started the  patient on OxyContin.  The family states that after he started taking  the OxyContin he became very sleepy most of the time and per family  having some visual hallucinations and some confusion.  He was  progressively getting weak to the point that his gait was unsteady on  the day of presentation and so he was asked to come to the ER by Dr.  Darrold Span.   In the ER lab work was done and showed a calcium of 14.7, albumin of  2.2.  His ammonia level 44 and his AST mildly elevated at 63 and ALT  elevated at 71.  His urinalysis was negative for infection.  A CT scan  of his brain without contrast showed no acute intracranial  abnormalities.  Innumerable lytic lesions throughout the skull and in  the visualized  upper cervical spine were noted.  Multiple myeloma versus  widespread metastasis per radiology.  A chest x-ray was done which  showed elevation of the right hemidiaphragm with dense subsegmental  atelectasis, no air space disease/pneumonia.  Oncology was consulted per  the ED physician and admission to the medicine service recommended.  The  patient was started on IV fluids and given Lasix, dexamethasone and  Zometa.  He is admitted for further evaluation and management.  His  creatinine in the ER 2.4 with a BUN of 41.   PAST MEDICAL HISTORY:  As above.   MEDICATIONS:  1. OxyContin 20 mg q.12 h.  2. Metformin 1000 mg b.i.d.  3. Clonidine 0.2 mg daily.  4. Aspirin 81 mg daily.  5. Diovan/HCTZ 320/25 mg daily.  6. Lantus 50 units subcu q.h.s.  7. Vicodin 5/500 p.r.n.   ALLERGIES:  NKDA.   SOCIAL HISTORY:  Positive for tobacco half pack per day for about 50  years.  He states that he drinks beer daily.  He denies any prior  history of DTs.   FAMILY HISTORY:  His sister has  multiple myeloma.  Mother heart disease  and hypertension.  Father is deceased, had end-stage renal disease.   REVIEW OF SYSTEMS:  As per HPI.  Other review of systems negative.   PHYSICAL EXAM:  GENERAL:  In general he is an older black male,  somnolent, in no respiratory distress.  VITAL SIGNS:  His temperature is 97.7, blood pressure 156/101, initially  127/97, pulse is 101, respiratory rate is 16, O2 sat 91%.  HEENT:  PERRL, EOMI, sclerae anicteric, slightly dry mucous membranes  and no oral exudates.  NECK:  Supple, no adenopathy, no thyromegaly and no JVD.  LUNGS:  Moderate air movement, no wheezes, no crackles.  CARDIOVASCULAR:  Regular rate and rhythm, normal S1-S2.  No S3  appreciated.  ABDOMEN:  Soft, bowel sounds present, nontender, nondistended.  No  masses palpable.  EXTREMITIES:  No cyanosis and no edema.  NEUROLOGICAL:  He is somnolent, alert to voice and is oriented x3.  The  strength in his upper extremities 4-5/5 strength, in his lower  extremities 4-/5.   LABORATORY DATA:  CT of head and chest x-ray as per HPI.  His sodium is  142, potassium 4.1, chloride 103, BUN of 41, creatinine 2.4, glucose 71,  ionized calcium 1.91, white cell count 3.9 with a hemoglobin of 13.5,  hematocrit 39.7, platelet count of 223.  His albumin is 2.2, AST 63, ALT  71, total bilirubin is 0.7 and direct bilirubin 0.1.  LDH is 105.  Alcohol level is less than 5, calcium is 14.7, ammonia level is 44.  Urinalysis is negative for infection.   ASSESSMENT AND PLAN:  1. Probable multiple myeloma - as discussed above - the patient      presenting with back, rib cage and hip pain bilaterally,      hypercalcemia, worsening renal insufficiency and CT scan with      innumerable lytic lesions.  Hem/onc consulted and admission to      medicine service recommended and Dr. Arline Asp to see for further      recommendations.  2. Hypercalcemia - hydrate, as above he has received Lasix,      dexamethasone  and Zometa.  Follow and recheck.  Likely secondary to      #1.  3. Acute renal failure - worsened from his last creatinine of 1.55,      will hold Diovan/HCTZ, hydrate  as above and follow up.  Likely      secondary to #1.  4. Elevated ammonia level - possible component of hepatic      encephalopathy in this patient with hepatitis C and LFT abnormality      as noted above.  Will place on lactulose and follow.  5. Altered mental status - likely secondary to all of above, and also      pain medications - as noted per family his somnolence/confusion      started after he began taking OxyContin.  Follow.  6. Diabetes mellitus - monitor Accu-Cheks, cover with sliding scale.      Will also add a lower dose of Lantus now given that his renal      function has worsened.  Follow and adjust as appropriate for      optimal blood glucose      control.  7. Hypertension - clonidine for now, hold Diovan/HCTZ as above.      Monitor blood pressures and further manage as appropriate.      Kela Millin, M.D.  Electronically Signed     ACV/MEDQ  D:  01/14/2008  T:  01/14/2008  Job:  [id number]   cc:   Lajuana Matte, MD  Fax: [phone]   [name]. Polite, M.D.

## 2010-07-08 NOTE — Discharge Summary (Signed)
NAMESHERRY, ROGUS               ACCOUNT NO.:  [id number]   MEDICAL RECORD NO.:  [id number]          PATIENT TYPE:  INP   LOCATION:  1421                         [facility]ACILITY:  [facility]   PHYSICIAN:  [name]. Polite, M.D. DATE OF BIRTH:  08-15-1945   DATE OF ADMISSION:  01/13/2008  DATE OF DISCHARGE:                               DISCHARGE SUMMARY   DISCHARGE DIAGNOSES:  1. Multiple myeloma, bone marrow results pending.  The patient to      start Revlimid and Decadron outpatient.  2. Hypercalcemia, improved status post IV hydration and Zometa.  3. Diabetes.  4. Hypertension.  5. Mental status change secondary to dehydration and hypercalcemia.  6. Acute renal failure secondary to dehydration, improved.  Creatinine      at discharge 1.7.  7. Elevated liver function studies:  AST 64, ALT 74.  8. Back pain secondary to compression fracture.  Please note the      patient neurologically intact.  Please note the patient desiring      discharge from the hospital   DISCHARGE MEDICATIONS:  1. OxyContin 20 mg every 12 hours.  2. Clonidine 0.2 mg 3 times daily.  3. Aspirin 81 mg daily.  4. Lantus 15 units nightly.  5. NovoLog sliding scale insulin.  6. Vicodin p.r.n.  7. Decadron 4 mg b.i.d.  8. Protonix 40 mg daily.  9. Valtrex 500 mg daily.  10.Norvasc 10 mg daily.  11.Lopressor 100 mg b.i.d.   DISPOSITION:  The patient being discharged to home per his request in  order to be home for the holiday.   FOLLOWUP:  The patient to follow up with Oncology and primary medical  doctor in 1 week.   CONSULTANTS:  Lajuana Matte, MD, Oncology.   STUDIES:  The patient had a skeletal survey which showed diffuse  osteolytic lesions of axial appendicular skeleton most likely  representing multiple myeloma.  No pathologic fractures in the  appendicular skeleton.  Multiple thoracolumbar compression fractures,  presumed malignant.  Widespread distribution is suggestive of multiple  myeloma.  CT  of the head:  No acute intracranial abnormality and numerous lytic  lesions throughout the skull and in the visualized upper cervical spine.  Chest x-ray:  PICC line in right upper extremity.  Creatinine at discharge 1.7, calcium 8.1, hemoglobin 11.2.   HISTORY OF PRESENT ILLNESS:  A 65 year old male presented to the ED with  confusion.  Of note the patient had been evaluated for the possibility  of myeloma on an outpatient basis.  He was already in contact with the  hematologist/oncologist.  In the ED the patient was evaluated.  He was  confused, had elevated calcium.  He was admitted for further evaluation  and management of hypercalcemia.  Please see dictated H and P for  further details.   PAST MEDICAL HISTORY:  As stated above and per admission H and P, which  includes multiple myeloma, diabetes, hypertension.  Meds on admission  per admission H and P.   SOCIAL HISTORY:  Per admission H and P.   FAMILY HISTORY:  Per admission H and P.  HOSPITAL COURSE:  The patient was admitted to a telemetry floor bed for  evaluation and treatment.  The patient was seen in consultation by  Oncology.  The patient was given IV Zometa, aggressive IV fluid  supplementation.  The patient's calcium improved with the above  measures.  He did have some period of agitation that  required  antipsychotics.  However, he will not be discharged on any.  The patient  is back to his baseline level of function.  His BP is still elevated at  times more than likely secondary to pain.  The patient underwent bone  marrow during this hospitalization.  Those studies are pending.  There  will be further followup on an  outpatient basis with Hematology.  At  this time the patient is neurologically intact, hemodynamically stable.  No electrolyte abnormalities.  He is being discharged to home and will  have followup with Oncology on an outpatient basis.      [name]. Polite, M.D.  Electronically Signed      RDP/MEDQ  D:  01/18/2008  T:  01/18/2008  Job:  [id number]

## 2010-07-08 NOTE — Consult Note (Signed)
Rodney Green, Rodney Green               ACCOUNT NO.:  [id number]   MEDICAL RECORD NO.:  [id number]          PATIENT TYPE:  OUT   LOCATION:  XRAY                         FACILITY:  [facility]   PHYSICIAN:  Dr. Corliss Skains          DATE OF BIRTH:  [birth year]   DATE OF CONSULTATION:  04/02/2008  DATE OF DISCHARGE:                                 CONSULTATION   CHIEF COMPLAINT:  Back pain x2 months.   HISTORY OF PRESENT ILLNESS:  This is a very pleasant 65 year old male,  who was admitted to [facility] in November 2009 with confusion.  He was found to have elevated calcium levels and dehydration.  There was  also a concern that he had multiple myeloma.  A bone marrow was  performed and confirmed this diagnosis.   Since that time, the patient is followed up with Dr. Si Gaul.  He has undergone radiation therapy treatments and is currently on  chemotherapy.  He has had severe rib pain as well as back pain.  He is  able to ambulate with a cane, but has difficulty getting in and out of  bed and standing from a seated position.  He had an MRI of the lumbar  thoracic spine on November 7.  This was consistent with multiple myeloma  as well as multiple compression fractures.  The patient presents today  accompanied by his wife to be evaluated by Dr. Corliss Skains.   PAST MEDICAL HISTORY:  Significant for diabetes mellitus, hypertension,  history of hepatitis C, history of multiple myeloma, history of  hypercalcemia, multiple compression fractures, and previous history of  acute renal failure.   PAST SURGICAL HISTORY:  The patient has had right thumb surgery.  He has  had a circumcision.  He had surgery for an abdominal abscess.  He denies  any previous problems with anesthesia.   ALLERGIES:  The patient has experienced hallucinations apparently with  OXYCONTIN, although he currently takes this for his pain.   CURRENT MEDICATIONS:  1. Aspirin 81 mg daily.  2. Insulin.  3. Vicodin  p.r.n.  4. Decadron 10 tablets per week.  5. Protonix daily.  6. Norvasc daily.   I believe he is on an oral chemotherapeutic agent.   SOCIAL HISTORY:  The patient lives in [place] with his wife.  They  have 2 adult children.  He did smoke 2-1/2 pack cigarettes for 50 years.  He quit smoking about 1 month ago.  He has an occasional beer.  He was a  self-employed Curator.  His wife works as a Water quality scientist.   FAMILY HISTORY:  The patient's mother is still alive at age 38.  She has  hypertension, heart disease.  His father died in his 42s.  He had end-  stage renal disease.  He has a sister with multiple myeloma.   As noted, the patient presents today accompanied by his wife to be  evaluated for multiple compression fractures felt secondary to multiple  myeloma.  Dr. Corliss Skains reviewed the patient's recent as well as a  previous MRI performed in November 2009  with the patient and his wife.  He pointed out the multiple compression fractures, some of which had  already healed.  The patient does have subacute fractures at L4, L2, and  L1 that Dr. Corliss Skains felt would be amendable to treatment.  He  recommended a kyphoplasty at L4, vertebroplasty at L2, and a kyphoplasty  at L1.  Because of the possible density of the patient's vertebral  bodies, Dr. Corliss Skains has recommended these procedures to be performed  with anesthesia on standby for pain control.   The procedures were described in detail to the patient and his wife  along with the risks and benefits.  All of their questions were  answered.  Greater than 45 minutes was spent on this consult.  We have  tentatively scheduled the patient for this Thursday at 8:00 a.m. if we  can obtain certification from his insurance company.      Delton See, P.A.    ______________________________  Dr. Corliss Skains    DR/MEDQ  D:  04/02/2008  T:  04/03/2008  Job:  [id number]   cc:   Lajuana Matte, MD  [name]. Nehemiah Settle, M.D.  Dyke Brackett, M.D.

## 2010-07-11 ENCOUNTER — Encounter (HOSPITAL_BASED_OUTPATIENT_CLINIC_OR_DEPARTMENT_OTHER): Payer: 59 | Admitting: Internal Medicine

## 2010-07-11 ENCOUNTER — Other Ambulatory Visit: Payer: Self-pay | Admitting: Internal Medicine

## 2010-07-11 DIAGNOSIS — Z5112 Encounter for antineoplastic immunotherapy: Secondary | ICD-10-CM

## 2010-07-11 DIAGNOSIS — Z5111 Encounter for antineoplastic chemotherapy: Secondary | ICD-10-CM

## 2010-07-11 DIAGNOSIS — C9000 Multiple myeloma not having achieved remission: Secondary | ICD-10-CM

## 2010-07-11 LAB — CBC WITH DIFFERENTIAL/PLATELET
BASO%: 0.7 % (ref 0.0–2.0)
EOS%: 2 % (ref 0.0–7.0)
LYMPH%: 23.4 % (ref 14.0–49.0)
MCHC: 33.1 g/dL (ref 32.0–36.0)
MCV: 95.7 fL (ref 79.3–98.0)
MONO%: 24.2 % — ABNORMAL HIGH (ref 0.0–14.0)
Platelets: 127 10*3/uL — ABNORMAL LOW (ref 140–400)
RBC: 3.7 10*6/uL — ABNORMAL LOW (ref 4.20–5.82)
RDW: 14.8 % — ABNORMAL HIGH (ref 11.0–14.6)
WBC: 4.6 10*3/uL (ref 4.0–10.3)

## 2010-07-11 NOTE — Op Note (Signed)
Rodney Green, Rodney Green                         ACCOUNT NO.:  [id number]   MEDICAL RECORD NO.:  [id number]                   PATIENT TYPE:  INP   LOCATION:  3001                                 FACILITY:  [facility]   PHYSICIAN:  Dionne Ano. Everlene Other, M.D.         DATE OF BIRTH:  [birth year]   DATE OF PROCEDURE:  08/02/2003  DATE OF DISCHARGE:                                 OPERATIVE REPORT   PREOPERATIVE DIAGNOSES:  1. Abscess right thumb thenar area.  2. Advanced left hand infection to include thumb and thenar space in a 78-     year-old insulin-dependent diabetic male.   POSTOPERATIVE DIAGNOSES:  1. Abscess right thumb thenar area.  2. Advanced left hand infection to include thumb and thenar space in a 46-     year-old insulin-dependent diabetic male.   PROCEDURE:  Incision and drainage deep abscess right thumb including the  thenar space.  Exploration of the flexor sheath system and debridement of  necrotic, nonviable tissue about the thumb.   SURGEON:  Dionne Ano. Amanda Pea, M.D.   ASSISTANT:  None.   COMPLICATIONS:  None immediate.   ANESTHESIA:  General.   TOURNIQUET TIME:  Less than 20 minutes.   SPECIMENS:  None.   CULTURES:  Multiple.   DRAINS:  Two.   INDICATIONS FOR PROCEDURE:  This patient is a 65 year old male who I was  asked to see tonight in regards to his right upper extremity predicament.  This patient had a puncture wound Saturday, July 28, 2003.  He presented to  his physician on Tuesday and was placed on p.o. antibiotics and continued to  worsen and, thus, was admitted to the hospital on August 01, 2003, and placed  on IV antibiotics.  He has had poor response to the IV antibiotic regimen  and I was asked to see him tonight.  I evaluated him and his findings.  I  should note that the patient was difficult to talk to due to the  excruciating pain he was in.  I discussed this with his wife and confirmed  with the nursing staff that the patient had been in  incapacitating pain over  the last 24 hours.  This pain has been rather severe and makes communicating  with the patient quite difficult I should note.  This was fairly impressive  at bedside as I examined him I should note.  The patient would hold his  thumb and hand, rock, and was simply unable to talk to me in regards to the  hand.  His wife confirmed this.  The patient's family was at bedside and I  discussed with them the upper extremity predicament and performed a physical  examination, chart review, etc.   I noted that the patient had what I felt was an abscess about the hand and I  prepped him for surgery immediately.  I should note that preoperatively the  patient did have scant refill  at the tip of the thumb.  He does have a large  amount of callosity in this region as he does in the other fingers as he  does work in [employer].  I discussed with the family the risk of  necrosis causing loss of the thumb, the risks of bleeding, anesthesia,  damage to normal structures, and failure of surgery to accomplish his  intended goals of relieving symptoms and restoring function.  I have  discussed with them there are no absolute guarantees; however, we would try  everything within our power to eradicate his infection which appears to be  significantly advanced at the present time.   OPERATIVE PROCEDURE IN DETAIL:  The patient was seen by myself and  anesthesia and taken to the operative suite and underwent a thorough  induction of general anesthesia.  He was prepped and draped in the usual  sterile fashion about the right upper extremity.  The tourniquet was then  elevated to 250 mmHg and I examined the hand.  The patient had a prior  puncture site volar-ulnar about the thumb and this was opened with a  modified Bruner incision.  I immediately encountered a large amount of  purulence.  The patient had greater than 80 mL to 100 mL of purulence in  this region.  Aerobic and anaerobic  cultures were taken.  I then extended  the incision into the thenar space and distally towards the thumb tip.  I  should note that the patient had skin incised.  Following this, I simply  bluntly dissected due to what I felt would be friable nature of the tissues  and nerves and arteries.  The patient had scant refill to the tip of the  thumb preoperatively and thus I did not want to cause spasm or jeopardize  any remaining blood flow to the tip.  The patient had blunt dissection  accomplished and, following this, I then evacuated the abscess in the thenar  space through this incision.  Once this was done and it was thoroughly  evacuated and cultures were taken, I then incised the flexor sheath which  did not have a large amount of purulence in it.  His skin was very de-  epithelialized and in poor condition.  In addition to this, the patient had  a large amount of necrotic tissue throughout the puncture wound.  The  purulence was very foul-smelling and invasive in terms of its spread.  I  made an additional 1.5-cm incision in the dorsal web space to perform a  limited fasciotomy of the first dorsal interosseous muscle which did bulge.  This was accomplished without difficulty.  The patient tolerated this well.  There was no gross infiltration of the abscess into the interosseous.  The  patient had thorough greater than 4 liters of irrigation warm in nature  applied to the thumb and, following this, drains were placed.  I should note  the tourniquet was set for a brief period of time (less than 20 minutes) as  I wanted to limit the tourniquet time and ischemia to the thumb which was  already refilling somewhat poorly I should note.  Once the tourniquet was  down, the patient did have turgor to the thumb tip and his refill did not  change appreciably from preoperative evaluation.  I feel this is due to the necrosis and active infection ongoing.  I did debride the tissue to my  satisfaction.   The patient had Penrose drains and  Xeroform applied followed  by fluff gauze and a thumb spica splint.  He tolerated this well.  He  maintained turgor in the recovery room and I checked this closely.   The patient still continued to have some decreased refill to the thumb which  we will continue to watch.  I would give his thumb a guarded prognosis as he  may ultimately have ischemia to the tip which demarcates further.  I have  discussed this with his family at length.  I will continue to monitor him  closely.  I am going to change his antibiotics  to Unasyn 3 g IV q.6h. and continue precautions in regards to his upper  extremity.  He will likely need a second-look washout in 24-36 hours and  this was explained to the family.  All questions have been encouraged and  answered.                                               Dionne Ano. Everlene Other, M.D.    Rodney Green  D:  08/02/2003  T:  08/03/2003  Job:  [id number]   cc:   Thora Lance, M.D.  [address] [place]  [place] [address]  Fax: [phone]   Ike Bene, M.D.  [address]  [place]  [place] [address]  Fax: [phone]

## 2010-07-11 NOTE — Op Note (Signed)
NAME:  Rodney Green, Rodney Green                         ACCOUNT NO.:  [id number]   MEDICAL RECORD NO.:  [id number]                   PATIENT TYPE:  AMB   LOCATION:  DAY                                  FACILITY:  [facility]   PHYSICIAN:  Ronald L. Ovidio Hanger, M.D.           DATE OF BIRTH:  12-25-1945   DATE OF PROCEDURE:  06/03/2002  DATE OF DISCHARGE:                                 OPERATIVE REPORT   PREOPERATIVE DIAGNOSES:  Phimosis, balanitis and penile abscess, poorly  controlled diabetes.   POSTOPERATIVE DIAGNOSES:  Phimosis, balanitis and penile abscess, poorly  controlled diabetes.   PROCEDURE:  Excision of penile abscess tissue and circumcision.   SURGEON:  Lucrezia Starch. Earlene Plater, M.D.   ANESTHESIA:  General endotracheal.   ESTIMATED BLOOD LOSS:  75 mL   TUBES:  None.   COMPLICATIONS:  None.   INDICATIONS FOR PROCEDURE:  Rodney Green is a very nice 65 year old white male  who develops tenderness and swelling of the penis. He notes that on the left  side of his foreskin which could not be retracted at that point, he  developed a spontaneous drainage of it. He has fever and very poorly  controlled diabetes with sugars well into the 300s. He notes that it became  tender again and drained a secondary time and he saw me for evaluation. He  cannot reduce the skin, it is quite tender and again his diabetes is  difficult to control. He has been on Augmentin for this and although it was  not fluctuant there were still areas dorsal of induration and it was felt  that excision in this tissue with a circumcision would be curative. He  understands the risks, benefits, and alternatives of bleeding, infection,  etc. and wished to proceed according. It was done on a semi-emergent basis.   DESCRIPTION OF PROCEDURE:  The patient was placed in supine position, after  general endotracheal anesthesia was prepped and draped with Betadine in a  sterile fashion. The foreskin could not be retracted. The dorsal  tissue was  quite thickened and adherent to the [place] fascia below it. Proper marks were  made at the proper level on the shaft skin and circumferential incision was  made. Again the dorsum was highly stuck and extensive dissection had to be  performed to dissect it from [facility] fascia and the superficial dorsal vein of  the penis. Although this was accomplished, it felt like the tissue planes  were quite soft at that level. The foreskin was then reduced, the area was  reprepped with Betadine, a circumferential incision was made approximately 3  mm proximal to the __________ and again dorsal dissection plane was  performed and it was carried down to [facility] fascia circumferentially.  Utilizing both blunt and sharp dissection at the proper plane was carried to  the appropriate level of the shaft skin and again it was much more extensive  dissection than  was required normally with circumcision and the tissue was  carefully excised utilizing both sharp and blunt dissection and Bovie  coagulation cautery. Thorough irrigation was performed with saline. Bleeders  were coagulated with Bovie coagulation cautery. Good hemostasis was noted to  be present and the shaft skin was then approximated to the mucosa with a  dorsal 3-0 Chromic catgut and a U stitch ventrally and  running sutures were used on each side to close it. Thorough irrigation was  performed, good hemostasis was noted to be present, tissue was noted to be  soft. Foreskin was submitted to pathology. The wound was dressed with  Vaseline gauze, 4 x 4 and Coban dressing, and the patient was taken to the  recovery room stable.                                               Ronald L. Ovidio Hanger, M.D.    RLD/MEDQ  D:  06/03/2002  T:  06/03/2002  Job:  [id number]

## 2010-07-11 NOTE — Op Note (Signed)
Rodney Green, Rodney Green                         ACCOUNT NO.:  [id number]   MEDICAL RECORD NO.:  [id number]                   PATIENT TYPE:  INP   LOCATION:  3001                                 FACILITY:  [facility]   PHYSICIAN:  Dionne Ano. Everlene Other, M.D.         DATE OF BIRTH:  10-06-1945   DATE OF PROCEDURE:  08/04/2003  DATE OF DISCHARGE:                                 OPERATIVE REPORT   PREOPERATIVE DIAGNOSIS:  Infected right thenar space and thumb apparatus  status post initial incision and drainage on August 02, 2003.   POSTOPERATIVE DIAGNOSIS:  Infected right thenar space and thumb apparatus  status post initial incision and drainage on August 02, 2003.   PROCEDURE:  Incision and drainage right thumb flexor sheath region as well  as thenar space region.  Two separate areas had incision and drainage.  This  included debridement of skin and subcutaneous and muscular tissue.   SURGEON:  Dionne Ano. Amanda Pea, M.D.   ASSISTANT:  None.   COMPLICATIONS:  None.   DRAINS:  Two.   INDICATIONS FOR PROCEDURE:  The patient is a 65 year old male who presents  for repeat I&D of his right thumb.  He presented for my evaluation on August 02, 2003, as an inpatient in the hospital.  His status has actually markedly  improved since I met him.  The patient has certainly improved.  He has cap  refill which is less than two seconds.  He has intact EPL and FPL function  and has progressed fairly well given the severity of infection and near  ischemic pain that he was having in my opinion.  I have counseled him in  regards to the continued aggressive treatment and the risks and benefits.  He desires to proceed with surgical intervention for washout.   OPERATIVE PROCEDURE:  The patient was taken to the operative suite and  underwent a general endotracheal anesthetic under the direction of Dr.  Laverle Hobby.  He was placed supine and appropriately padded, prepped, and  draped in the usual sterile fashion  about the right hand.  I immediately  noted improved wound conditions, a much softer thenar compartment and some  residual purulence.  After thorough prep and drape with Betadine scrub and  paint, we then irrigated the wound and performed a debridement.  This  debridement included the flexor tendon sheath as well as the thenar space  and associated skin, subcu, and muscular regions including the abductor  pollicis brevis.  I irrigated with greater than 4 liters of warm saline.  His capillary refill was maintained nicely and he had no tourniquet  insufflation during the case.  Following this, the patient had two iodoform  1/4-inch drains placed in the wound followed by Adaptic gauze and a thumb  spica splint.  He tolerated this well.  There were no complicating features.   He was taken to the recovery room after extubation and will  be continued on  antibiotics, postoperative observation, and other precautions.  I have  discussed with him the do's and don't's, etc., and the proposed treatment  algorithm postop.  He understands and will continue the course.                                               Dionne Ano. Everlene Other, M.D.    Nash Mantis  D:  08/04/2003  T:  08/05/2003  Job:  [id number]   cc:   Thora Lance, M.D.  [address] [place]  [place] [address]  Fax: [phone]   Ike Bene, M.D.  [address]  [place]  [place] [address]  Fax: [phone]

## 2010-07-11 NOTE — Discharge Summary (Signed)
NAMEJLYNN, Rodney Green                         ACCOUNT NO.:  [id number]   MEDICAL RECORD NO.:  [id number]                   PATIENT TYPE:  INP   LOCATION:  3001                                 FACILITY:  [facility]   PHYSICIAN:  Rodney Green, M.D.              DATE OF BIRTH:  [birth year]   DATE OF ADMISSION:  08/01/2003  DATE OF DISCHARGE:  08/06/2003                                 DISCHARGE SUMMARY   DISCHARGE DIAGNOSES:  1. Cellulitis of the hand.  2. Dehydration.  3. Diabetes mellitus.  4. Hypertension.   Rodney Green was admitted to the hospital acutely after marked edema and  swelling of his hand with failed outpatient therapy for cellulitis. He had  puncture wound to his hand that occurred several days prior to admission.  His premorbid situation basically only involved hypertension and diabetes.  Blood sugars have been well controlled.   The patient was started on IV antibiotic therapy. Within 24 hours, his pain  dramatically worsened. X-rays showed evidence of free air, and orthopedic  hand consultation was obtained. On the same day elected to take him to  surgery for intraoperative debridement. There was evidence of a necrosis. No  significant abscess in the region of his hand with a fair amount of  purulence. Incision and drainage was performed. A little area was left open  to have a repeat intraoperative clean out down two days later.   The patient did significantly improve fairly quickly. His white count  normalized. Temperature came down. His pain was markedly improved. He was  followed very closely by Dr. Amanda Pea. Decision was made also on June 11 to  repeat the incision and drainage of the right thumb and evacuation of the  area. Cultures were positive for gram negative rods and gram positive cocci.  Antibiotics were continued. He was placed on Unasyn.   The patient continued to improve. Dr. Amanda Pea felt the patient was stable to  be discharge on June 13 and switched  him to Augmentin and Percocet. He was  to follow up with outpatient aggressive wound management in the orthopedic  clinic.   DISCHARGE MEDICATIONS:  1. Augmentin 875 one tablet two times daily for three weeks.  2. Percocet 5 mg one tablet every 4 to 6 hours as needed.  3. Peri-Colace to prevent constipation.   He was also to continue his preoperative medications including:  1. Micardis 160/12.5.  2. Metformin 500 mg 2 tablets b.i.d.  3. Lantus 40 mg at bedtime.  4. Aspirin daily.   He was to follow up on the following Tuesday with the hand therapist and on  the following Wednesday with Dr. Theda Belfast.  Rodney Green, M.D.    RM/MEDQ  D:  08/30/2003  T:  08/31/2003  Job:  [id number]

## 2010-07-11 NOTE — Procedures (Signed)
[facility]  Patient:    Rodney Green, Rodney Green                        MRN: [id number] Proc. Date: 12/18/99 Attending:  Verlin Grills, M.D.                           Procedure Report  PROCEDURE:  Colonoscopy and polypectomy.  ENDOSCOPIST:  Verlin Grills, M.D.  INDICATIONS FOR PROCEDURE:  The patient is a 65 year old male who is due for colorectal cancer screening.  I discussed with the patient the complications associated with colonoscopy and polypectomy including intestinal bleeding and intestinal perforation.  The patient has signed the operative permit.  PAST MEDICAL HISTORY:  Type 2 diabetes mellitus, chronic viral hepatitis C.  PREMEDICATION:  Versed 10 mg and Demerol 50 mg.  ENDOSCOPE:  Olympus pediatric colonoscope.  DESCRIPTION OF PROCEDURE:  After obtaining informed consent, the patient was placed in the left lateral decubitus position.  I administered intravenous Demerol and intravenous Versed to achieve conscious sedation for the procedure.  The patients blood pressure, oxygen saturation, and cardiac rhythm were monitored throughout the procedure and documented in the medical record.  Anal inspection was normal.  Digital rectal exam reveals a non-nodular prostate.  The Olympus pediatric video colonoscope was introduced into the rectum and under direct vision advanced to the cecum, as identified by a normal-appearing ileocecal valve.  Colonic preparation for the exam today was excellent.  Rectum normal.  Sigmoid colon and descending colon:  At 60 cm from the anal verge, a 1 mm sessile polyp was removed with the cold biopsy forceps and submitted to pathology for interpretation.  Splenic flexure normal.  Transverse colon normal.  Hepatic flexure normal.  Ascending colon normal.  Cecum and ileocecal valve normal.  ASSESSMENT:  A 1 mm sessile polyp was removed from the left colon at 60 cm from the anal verge, otherwise  normal proctocolonoscopy to the cecum.  RECOMMENDATIONS:  Repeat colonoscopy in approximately five years. DD:  12/18/99 TD:  12/18/99 Job: [id number]

## 2010-07-15 ENCOUNTER — Encounter (HOSPITAL_BASED_OUTPATIENT_CLINIC_OR_DEPARTMENT_OTHER): Payer: 59 | Admitting: Internal Medicine

## 2010-07-15 ENCOUNTER — Other Ambulatory Visit: Payer: Self-pay | Admitting: Internal Medicine

## 2010-07-15 DIAGNOSIS — Z5112 Encounter for antineoplastic immunotherapy: Secondary | ICD-10-CM

## 2010-07-15 DIAGNOSIS — C9000 Multiple myeloma not having achieved remission: Secondary | ICD-10-CM

## 2010-07-15 LAB — CBC WITH DIFFERENTIAL/PLATELET
BASO%: 0.1 % (ref 0.0–2.0)
LYMPH%: 3.3 % — ABNORMAL LOW (ref 14.0–49.0)
MCHC: 33.1 g/dL (ref 32.0–36.0)
MONO#: 1.7 10*3/uL — ABNORMAL HIGH (ref 0.1–0.9)
NEUT#: 34.2 10*3/uL — ABNORMAL HIGH (ref 1.5–6.5)
RBC: 3.83 10*6/uL — ABNORMAL LOW (ref 4.20–5.82)
RDW: 14.8 % — ABNORMAL HIGH (ref 11.0–14.6)
WBC: 37.2 10*3/uL — ABNORMAL HIGH (ref 4.0–10.3)
lymph#: 1.2 10*3/uL (ref 0.9–3.3)
nRBC: 0 % (ref 0–0)

## 2010-07-15 LAB — COMPREHENSIVE METABOLIC PANEL
ALT: 21 U/L (ref 0–53)
AST: 34 U/L (ref 0–37)
Calcium: 9.5 mg/dL (ref 8.4–10.5)
Chloride: 104 mEq/L (ref 96–112)
Creatinine, Ser: 1.57 mg/dL — ABNORMAL HIGH (ref 0.40–1.50)
Potassium: 4.3 mEq/L (ref 3.5–5.3)
Sodium: 143 mEq/L (ref 135–145)
Total Protein: 6.8 g/dL (ref 6.0–8.3)

## 2010-07-18 ENCOUNTER — Encounter (HOSPITAL_BASED_OUTPATIENT_CLINIC_OR_DEPARTMENT_OTHER): Payer: 59 | Admitting: Internal Medicine

## 2010-07-18 DIAGNOSIS — Z5112 Encounter for antineoplastic immunotherapy: Secondary | ICD-10-CM

## 2010-07-18 DIAGNOSIS — C9000 Multiple myeloma not having achieved remission: Secondary | ICD-10-CM

## 2010-07-22 ENCOUNTER — Other Ambulatory Visit: Payer: Self-pay | Admitting: Internal Medicine

## 2010-07-22 ENCOUNTER — Encounter (HOSPITAL_BASED_OUTPATIENT_CLINIC_OR_DEPARTMENT_OTHER): Payer: 59 | Admitting: Internal Medicine

## 2010-07-22 DIAGNOSIS — D649 Anemia, unspecified: Secondary | ICD-10-CM

## 2010-07-22 DIAGNOSIS — Z5112 Encounter for antineoplastic immunotherapy: Secondary | ICD-10-CM

## 2010-07-22 DIAGNOSIS — C9000 Multiple myeloma not having achieved remission: Secondary | ICD-10-CM

## 2010-07-22 LAB — COMPREHENSIVE METABOLIC PANEL
Albumin: 3.8 g/dL (ref 3.5–5.2)
BUN: 31 mg/dL — ABNORMAL HIGH (ref 6–23)
CO2: 26 mEq/L (ref 19–32)
Glucose, Bld: 141 mg/dL — ABNORMAL HIGH (ref 70–99)
Potassium: 4 mEq/L (ref 3.5–5.3)
Sodium: 138 mEq/L (ref 135–145)
Total Bilirubin: 0.4 mg/dL (ref 0.3–1.2)
Total Protein: 6.7 g/dL (ref 6.0–8.3)

## 2010-07-22 LAB — CBC WITH DIFFERENTIAL/PLATELET
Basophils Absolute: 0 10*3/uL (ref 0.0–0.1)
Eosinophils Absolute: 0 10*3/uL (ref 0.0–0.5)
HCT: 36.2 % — ABNORMAL LOW (ref 38.4–49.9)
HGB: 12.1 g/dL — ABNORMAL LOW (ref 13.0–17.1)
MCH: 31.8 pg (ref 27.2–33.4)
MCV: 95 fL (ref 79.3–98.0)
MONO%: 30.2 % — ABNORMAL HIGH (ref 0.0–14.0)
NEUT#: 0.3 10*3/uL — CL (ref 1.5–6.5)
NEUT%: 20.1 % — ABNORMAL LOW (ref 39.0–75.0)
RDW: 14 % (ref 11.0–14.6)

## 2010-07-22 LAB — LACTATE DEHYDROGENASE: LDH: 438 U/L — ABNORMAL HIGH (ref 94–250)

## 2010-07-29 ENCOUNTER — Other Ambulatory Visit: Payer: Self-pay | Admitting: Internal Medicine

## 2010-07-29 ENCOUNTER — Encounter (HOSPITAL_BASED_OUTPATIENT_CLINIC_OR_DEPARTMENT_OTHER): Payer: 59 | Admitting: Internal Medicine

## 2010-07-29 DIAGNOSIS — Z5112 Encounter for antineoplastic immunotherapy: Secondary | ICD-10-CM

## 2010-07-29 DIAGNOSIS — C9000 Multiple myeloma not having achieved remission: Secondary | ICD-10-CM

## 2010-07-29 LAB — CBC WITH DIFFERENTIAL/PLATELET
BASO%: 0.2 % (ref 0.0–2.0)
MCHC: 33.4 g/dL (ref 32.0–36.0)
MONO#: 1.4 10*3/uL — ABNORMAL HIGH (ref 0.1–0.9)
NEUT#: 13.3 10*3/uL — ABNORMAL HIGH (ref 1.5–6.5)
RBC: 3.84 10*6/uL — ABNORMAL LOW (ref 4.20–5.82)
WBC: 15.8 10*3/uL — ABNORMAL HIGH (ref 4.0–10.3)
lymph#: 1 10*3/uL (ref 0.9–3.3)
nRBC: 0 % (ref 0–0)

## 2010-07-30 LAB — KAPPA/LAMBDA LIGHT CHAINS
Kappa free light chain: 2.01 mg/dL — ABNORMAL HIGH (ref 0.33–1.94)
Kappa:Lambda Ratio: 0.53 (ref 0.26–1.65)
Lambda Free Lght Chn: 3.76 mg/dL — ABNORMAL HIGH (ref 0.57–2.63)

## 2010-07-30 LAB — COMPREHENSIVE METABOLIC PANEL
AST: 35 U/L (ref 0–37)
Albumin: 4.2 g/dL (ref 3.5–5.2)
BUN: 27 mg/dL — ABNORMAL HIGH (ref 6–23)
Calcium: 9.2 mg/dL (ref 8.4–10.5)
Chloride: 108 mEq/L (ref 96–112)
Glucose, Bld: 36 mg/dL — CL (ref 70–99)
Potassium: 4.4 mEq/L (ref 3.5–5.3)

## 2010-07-30 LAB — IGG, IGA, IGM
IgA: 196 mg/dL (ref 68–379)
IgG (Immunoglobin G), Serum: 777 mg/dL (ref 650–1600)

## 2010-08-01 ENCOUNTER — Encounter (HOSPITAL_BASED_OUTPATIENT_CLINIC_OR_DEPARTMENT_OTHER): Payer: 59 | Admitting: Internal Medicine

## 2010-08-01 DIAGNOSIS — Z5112 Encounter for antineoplastic immunotherapy: Secondary | ICD-10-CM

## 2010-08-01 DIAGNOSIS — Z5111 Encounter for antineoplastic chemotherapy: Secondary | ICD-10-CM

## 2010-08-01 DIAGNOSIS — C9000 Multiple myeloma not having achieved remission: Secondary | ICD-10-CM

## 2010-08-05 ENCOUNTER — Other Ambulatory Visit: Payer: Self-pay | Admitting: Internal Medicine

## 2010-08-05 ENCOUNTER — Encounter (HOSPITAL_BASED_OUTPATIENT_CLINIC_OR_DEPARTMENT_OTHER): Payer: 59 | Admitting: Internal Medicine

## 2010-08-05 DIAGNOSIS — Z5111 Encounter for antineoplastic chemotherapy: Secondary | ICD-10-CM

## 2010-08-05 DIAGNOSIS — C9000 Multiple myeloma not having achieved remission: Secondary | ICD-10-CM

## 2010-08-05 LAB — CBC WITH DIFFERENTIAL/PLATELET
EOS%: 0.1 % (ref 0.0–7.0)
MCH: 32.6 pg (ref 27.2–33.4)
MCV: 97.6 fL (ref 79.3–98.0)
MONO%: 5.8 % (ref 0.0–14.0)
NEUT#: 43.4 10*3/uL — ABNORMAL HIGH (ref 1.5–6.5)
RBC: 3.74 10*6/uL — ABNORMAL LOW (ref 4.20–5.82)
RDW: 14.8 % — ABNORMAL HIGH (ref 11.0–14.6)
lymph#: 1 10*3/uL (ref 0.9–3.3)
nRBC: 0 % (ref 0–0)

## 2010-08-05 LAB — COMPREHENSIVE METABOLIC PANEL
Albumin: 4.2 g/dL (ref 3.5–5.2)
CO2: 23 mEq/L (ref 19–32)
Calcium: 9.1 mg/dL (ref 8.4–10.5)
Chloride: 106 mEq/L (ref 96–112)
Glucose, Bld: 74 mg/dL (ref 70–99)
Potassium: 3.9 mEq/L (ref 3.5–5.3)
Sodium: 140 mEq/L (ref 135–145)
Total Protein: 6.6 g/dL (ref 6.0–8.3)

## 2010-08-05 LAB — LACTATE DEHYDROGENASE: LDH: 437 U/L — ABNORMAL HIGH (ref 94–250)

## 2010-08-08 ENCOUNTER — Encounter (HOSPITAL_BASED_OUTPATIENT_CLINIC_OR_DEPARTMENT_OTHER): Payer: 59 | Admitting: Internal Medicine

## 2010-08-08 DIAGNOSIS — Z5111 Encounter for antineoplastic chemotherapy: Secondary | ICD-10-CM

## 2010-08-08 DIAGNOSIS — C9000 Multiple myeloma not having achieved remission: Secondary | ICD-10-CM

## 2010-08-12 ENCOUNTER — Encounter (HOSPITAL_BASED_OUTPATIENT_CLINIC_OR_DEPARTMENT_OTHER): Payer: 59 | Admitting: Internal Medicine

## 2010-08-12 ENCOUNTER — Other Ambulatory Visit: Payer: Self-pay | Admitting: Internal Medicine

## 2010-08-12 DIAGNOSIS — C9000 Multiple myeloma not having achieved remission: Secondary | ICD-10-CM

## 2010-08-12 DIAGNOSIS — Z5112 Encounter for antineoplastic immunotherapy: Secondary | ICD-10-CM

## 2010-08-12 LAB — CBC WITH DIFFERENTIAL/PLATELET
BASO%: 0.7 % (ref 0.0–2.0)
LYMPH%: 48.1 % (ref 14.0–49.0)
MCHC: 33.8 g/dL (ref 32.0–36.0)
MONO#: 0.4 10*3/uL (ref 0.1–0.9)
Platelets: 77 10*3/uL — ABNORMAL LOW (ref 140–400)
RBC: 3.89 10*6/uL — ABNORMAL LOW (ref 4.20–5.82)
RDW: 14 % (ref 11.0–14.6)
WBC: 1.4 10*3/uL — ABNORMAL LOW (ref 4.0–10.3)
lymph#: 0.7 10*3/uL — ABNORMAL LOW (ref 0.9–3.3)

## 2010-08-12 LAB — COMPREHENSIVE METABOLIC PANEL
ALT: 40 U/L (ref 0–53)
CO2: 26 mEq/L (ref 19–32)
Sodium: 141 mEq/L (ref 135–145)
Total Bilirubin: 0.5 mg/dL (ref 0.3–1.2)
Total Protein: 6.9 g/dL (ref 6.0–8.3)

## 2010-08-12 LAB — LACTATE DEHYDROGENASE: LDH: 488 U/L — ABNORMAL HIGH (ref 94–250)

## 2010-08-19 ENCOUNTER — Other Ambulatory Visit: Payer: Self-pay | Admitting: Internal Medicine

## 2010-08-19 ENCOUNTER — Encounter (HOSPITAL_BASED_OUTPATIENT_CLINIC_OR_DEPARTMENT_OTHER): Payer: 59 | Admitting: Internal Medicine

## 2010-08-19 DIAGNOSIS — C9000 Multiple myeloma not having achieved remission: Secondary | ICD-10-CM

## 2010-08-19 DIAGNOSIS — Z5111 Encounter for antineoplastic chemotherapy: Secondary | ICD-10-CM

## 2010-08-19 DIAGNOSIS — Z5112 Encounter for antineoplastic immunotherapy: Secondary | ICD-10-CM

## 2010-08-19 LAB — COMPREHENSIVE METABOLIC PANEL
AST: 33 U/L (ref 0–37)
Albumin: 4.3 g/dL (ref 3.5–5.2)
Alkaline Phosphatase: 138 U/L — ABNORMAL HIGH (ref 39–117)
BUN: 30 mg/dL — ABNORMAL HIGH (ref 6–23)
Calcium: 9.3 mg/dL (ref 8.4–10.5)
Chloride: 108 mEq/L (ref 96–112)
Glucose, Bld: 68 mg/dL — ABNORMAL LOW (ref 70–99)
Potassium: 4.2 mEq/L (ref 3.5–5.3)
Sodium: 145 mEq/L (ref 135–145)
Total Protein: 6.6 g/dL (ref 6.0–8.3)

## 2010-08-19 LAB — CBC WITH DIFFERENTIAL/PLATELET
BASO%: 0.2 % (ref 0.0–2.0)
Basophils Absolute: 0 10*3/uL (ref 0.0–0.1)
HCT: 34 % — ABNORMAL LOW (ref 38.4–49.9)
HGB: 11.6 g/dL — ABNORMAL LOW (ref 13.0–17.1)
MONO#: 1.5 10*3/uL — ABNORMAL HIGH (ref 0.1–0.9)
NEUT%: 83.3 % — ABNORMAL HIGH (ref 39.0–75.0)
RDW: 15.3 % — ABNORMAL HIGH (ref 11.0–14.6)
WBC: 12.5 10*3/uL — ABNORMAL HIGH (ref 4.0–10.3)
lymph#: 0.6 10*3/uL — ABNORMAL LOW (ref 0.9–3.3)

## 2010-08-22 ENCOUNTER — Encounter (HOSPITAL_BASED_OUTPATIENT_CLINIC_OR_DEPARTMENT_OTHER): Payer: 59 | Admitting: Internal Medicine

## 2010-08-22 ENCOUNTER — Ambulatory Visit
Admission: RE | Admit: 2010-08-22 | Discharge: 2010-08-22 | Disposition: A | Discharge status: Home / Self Care | Payer: 59 | Source: Ambulatory Visit | Attending: Internal Medicine | Admitting: Internal Medicine

## 2010-08-22 DIAGNOSIS — C9000 Multiple myeloma not having achieved remission: Secondary | ICD-10-CM

## 2010-08-22 DIAGNOSIS — Z5111 Encounter for antineoplastic chemotherapy: Secondary | ICD-10-CM

## 2010-08-26 ENCOUNTER — Other Ambulatory Visit: Payer: Self-pay | Admitting: Internal Medicine

## 2010-08-26 ENCOUNTER — Encounter (HOSPITAL_BASED_OUTPATIENT_CLINIC_OR_DEPARTMENT_OTHER): Payer: 59 | Admitting: Internal Medicine

## 2010-08-26 DIAGNOSIS — Z5112 Encounter for antineoplastic immunotherapy: Secondary | ICD-10-CM

## 2010-08-26 DIAGNOSIS — Z5111 Encounter for antineoplastic chemotherapy: Secondary | ICD-10-CM

## 2010-08-26 DIAGNOSIS — C9000 Multiple myeloma not having achieved remission: Secondary | ICD-10-CM

## 2010-08-26 LAB — COMPREHENSIVE METABOLIC PANEL
ALT: 23 U/L (ref 0–53)
AST: 33 U/L (ref 0–37)
Alkaline Phosphatase: 203 U/L — ABNORMAL HIGH (ref 39–117)
BUN: 30 mg/dL — ABNORMAL HIGH (ref 6–23)
Creatinine, Ser: 1.84 mg/dL — ABNORMAL HIGH (ref 0.50–1.35)
Total Bilirubin: 0.5 mg/dL (ref 0.3–1.2)

## 2010-08-26 LAB — CBC WITH DIFFERENTIAL/PLATELET
BASO%: 0 % (ref 0.0–2.0)
EOS%: 0.1 % (ref 0.0–7.0)
HCT: 36.3 % — ABNORMAL LOW (ref 38.4–49.9)
LYMPH%: 2.5 % — ABNORMAL LOW (ref 14.0–49.0)
MCH: 33.8 pg — ABNORMAL HIGH (ref 27.2–33.4)
MCHC: 34.1 g/dL (ref 32.0–36.0)
MCV: 99.2 fL — ABNORMAL HIGH (ref 79.3–98.0)
MONO%: 3.2 % (ref 0.0–14.0)
NEUT%: 94.2 % — ABNORMAL HIGH (ref 39.0–75.0)
Platelets: 134 10*3/uL — ABNORMAL LOW (ref 140–400)
lymph#: 1.1 10*3/uL (ref 0.9–3.3)

## 2010-08-29 ENCOUNTER — Encounter (HOSPITAL_BASED_OUTPATIENT_CLINIC_OR_DEPARTMENT_OTHER): Payer: 59 | Admitting: Internal Medicine

## 2010-08-29 ENCOUNTER — Other Ambulatory Visit: Payer: Self-pay | Admitting: Internal Medicine

## 2010-08-29 DIAGNOSIS — Z5111 Encounter for antineoplastic chemotherapy: Secondary | ICD-10-CM

## 2010-08-29 DIAGNOSIS — C9000 Multiple myeloma not having achieved remission: Secondary | ICD-10-CM

## 2010-08-29 DIAGNOSIS — Z5112 Encounter for antineoplastic immunotherapy: Secondary | ICD-10-CM

## 2010-08-29 LAB — COMPREHENSIVE METABOLIC PANEL
AST: 48 U/L — ABNORMAL HIGH (ref 0–37)
Albumin: 4.4 g/dL (ref 3.5–5.2)
Alkaline Phosphatase: 182 U/L — ABNORMAL HIGH (ref 39–117)
BUN: 25 mg/dL — ABNORMAL HIGH (ref 6–23)
Calcium: 9.4 mg/dL (ref 8.4–10.5)
Chloride: 106 mEq/L (ref 96–112)
Creatinine, Ser: 1.22 mg/dL (ref 0.50–1.35)
Glucose, Bld: 181 mg/dL — ABNORMAL HIGH (ref 70–99)
Potassium: 4.1 mEq/L (ref 3.5–5.3)

## 2010-08-29 LAB — CBC WITH DIFFERENTIAL/PLATELET
BASO%: 0.3 % (ref 0.0–2.0)
Basophils Absolute: 0 10*3/uL (ref 0.0–0.1)
EOS%: 0.8 % (ref 0.0–7.0)
HCT: 40 % (ref 38.4–49.9)
HGB: 13.7 g/dL (ref 13.0–17.1)
LYMPH%: 10.8 % — ABNORMAL LOW (ref 14.0–49.0)
MCH: 32.5 pg (ref 27.2–33.4)
MCHC: 34.3 g/dL (ref 32.0–36.0)
NEUT%: 62.6 % (ref 39.0–75.0)
Platelets: 98 10*3/uL — ABNORMAL LOW (ref 140–400)

## 2010-09-09 ENCOUNTER — Other Ambulatory Visit: Payer: Self-pay | Admitting: Internal Medicine

## 2010-09-09 ENCOUNTER — Encounter (HOSPITAL_BASED_OUTPATIENT_CLINIC_OR_DEPARTMENT_OTHER): Payer: 59 | Admitting: Internal Medicine

## 2010-09-09 DIAGNOSIS — Z5111 Encounter for antineoplastic chemotherapy: Secondary | ICD-10-CM

## 2010-09-09 DIAGNOSIS — Z5112 Encounter for antineoplastic immunotherapy: Secondary | ICD-10-CM

## 2010-09-09 DIAGNOSIS — C9000 Multiple myeloma not having achieved remission: Secondary | ICD-10-CM

## 2010-09-09 LAB — COMPREHENSIVE METABOLIC PANEL
Albumin: 4 g/dL (ref 3.5–5.2)
Alkaline Phosphatase: 135 U/L — ABNORMAL HIGH (ref 39–117)
BUN: 29 mg/dL — ABNORMAL HIGH (ref 6–23)
CO2: 24 mEq/L (ref 19–32)
Calcium: 9.7 mg/dL (ref 8.4–10.5)
Chloride: 104 mEq/L (ref 96–112)
Glucose, Bld: 80 mg/dL (ref 70–99)
Potassium: 4.3 mEq/L (ref 3.5–5.3)
Sodium: 141 mEq/L (ref 135–145)
Total Protein: 6.5 g/dL (ref 6.0–8.3)

## 2010-09-09 LAB — CBC WITH DIFFERENTIAL/PLATELET
Basophils Absolute: 0 10*3/uL (ref 0.0–0.1)
Eosinophils Absolute: 0.1 10*3/uL (ref 0.0–0.5)
HCT: 33.6 % — ABNORMAL LOW (ref 38.4–49.9)
HGB: 11.2 g/dL — ABNORMAL LOW (ref 13.0–17.1)
MCH: 32.6 pg (ref 27.2–33.4)
MONO#: 2.2 10*3/uL — ABNORMAL HIGH (ref 0.1–0.9)
NEUT#: 9.5 10*3/uL — ABNORMAL HIGH (ref 1.5–6.5)
NEUT%: 76.4 % — ABNORMAL HIGH (ref 39.0–75.0)
RDW: 14.7 % — ABNORMAL HIGH (ref 11.0–14.6)
lymph#: 0.6 10*3/uL — ABNORMAL LOW (ref 0.9–3.3)

## 2010-09-09 LAB — LACTATE DEHYDROGENASE: LDH: 386 U/L — ABNORMAL HIGH (ref 94–250)

## 2010-09-12 ENCOUNTER — Encounter (HOSPITAL_BASED_OUTPATIENT_CLINIC_OR_DEPARTMENT_OTHER): Payer: 59 | Admitting: Internal Medicine

## 2010-09-12 ENCOUNTER — Other Ambulatory Visit: Payer: Self-pay | Admitting: Internal Medicine

## 2010-09-12 DIAGNOSIS — Z5112 Encounter for antineoplastic immunotherapy: Secondary | ICD-10-CM

## 2010-09-12 DIAGNOSIS — Z5111 Encounter for antineoplastic chemotherapy: Secondary | ICD-10-CM

## 2010-09-12 DIAGNOSIS — C9000 Multiple myeloma not having achieved remission: Secondary | ICD-10-CM

## 2010-09-12 DIAGNOSIS — D472 Monoclonal gammopathy: Secondary | ICD-10-CM

## 2010-09-12 LAB — CBC WITH DIFFERENTIAL/PLATELET
BASO%: 0.6 % (ref 0.0–2.0)
LYMPH%: 18.8 % (ref 14.0–49.0)
MCHC: 33 g/dL (ref 32.0–36.0)
MONO#: 1 10*3/uL — ABNORMAL HIGH (ref 0.1–0.9)
NEUT#: 4.2 10*3/uL (ref 1.5–6.5)
Platelets: 150 10*3/uL (ref 140–400)
RBC: 3.58 10*6/uL — ABNORMAL LOW (ref 4.20–5.82)
RDW: 14.4 % (ref 11.0–14.6)
WBC: 6.7 10*3/uL (ref 4.0–10.3)

## 2010-09-12 LAB — COMPREHENSIVE METABOLIC PANEL
ALT: 20 U/L (ref 0–53)
Albumin: 4.1 g/dL (ref 3.5–5.2)
Alkaline Phosphatase: 126 U/L — ABNORMAL HIGH (ref 39–117)
CO2: 26 mEq/L (ref 19–32)
Potassium: 3.8 mEq/L (ref 3.5–5.3)
Sodium: 140 mEq/L (ref 135–145)
Total Bilirubin: 0.5 mg/dL (ref 0.3–1.2)
Total Protein: 6.5 g/dL (ref 6.0–8.3)

## 2010-09-12 LAB — LACTATE DEHYDROGENASE: LDH: 330 U/L — ABNORMAL HIGH (ref 94–250)

## 2010-09-16 ENCOUNTER — Other Ambulatory Visit: Payer: Self-pay | Admitting: Internal Medicine

## 2010-09-16 ENCOUNTER — Encounter (HOSPITAL_BASED_OUTPATIENT_CLINIC_OR_DEPARTMENT_OTHER): Payer: 59 | Admitting: Internal Medicine

## 2010-09-16 DIAGNOSIS — C9000 Multiple myeloma not having achieved remission: Secondary | ICD-10-CM

## 2010-09-16 DIAGNOSIS — Z5112 Encounter for antineoplastic immunotherapy: Secondary | ICD-10-CM

## 2010-09-16 DIAGNOSIS — D709 Neutropenia, unspecified: Secondary | ICD-10-CM

## 2010-09-16 LAB — CBC WITH DIFFERENTIAL/PLATELET
BASO%: 0.2 % (ref 0.0–2.0)
EOS%: 0.1 % (ref 0.0–7.0)
HCT: 37.8 % — ABNORMAL LOW (ref 38.4–49.9)
LYMPH%: 2.2 % — ABNORMAL LOW (ref 14.0–49.0)
MCH: 33.2 pg (ref 27.2–33.4)
MCHC: 33.6 g/dL (ref 32.0–36.0)
NEUT%: 90.6 % — ABNORMAL HIGH (ref 39.0–75.0)
Platelets: 138 10*3/uL — ABNORMAL LOW (ref 140–400)

## 2010-09-19 ENCOUNTER — Other Ambulatory Visit: Payer: Self-pay | Admitting: Internal Medicine

## 2010-09-19 ENCOUNTER — Encounter (HOSPITAL_BASED_OUTPATIENT_CLINIC_OR_DEPARTMENT_OTHER): Payer: 59 | Admitting: Internal Medicine

## 2010-09-19 DIAGNOSIS — D472 Monoclonal gammopathy: Secondary | ICD-10-CM

## 2010-09-19 LAB — CBC WITH DIFFERENTIAL/PLATELET
BASO%: 0.3 % (ref 0.0–2.0)
EOS%: 1.3 % (ref 0.0–7.0)
Eosinophils Absolute: 0 10*3/uL (ref 0.0–0.5)
LYMPH%: 11.3 % — ABNORMAL LOW (ref 14.0–49.0)
MCH: 32.6 pg (ref 27.2–33.4)
MCHC: 34 g/dL (ref 32.0–36.0)
MCV: 96 fL (ref 79.3–98.0)
MONO%: 24.7 % — ABNORMAL HIGH (ref 0.0–14.0)
Platelets: 95 10*3/uL — ABNORMAL LOW (ref 140–400)
RBC: 3.77 10*6/uL — ABNORMAL LOW (ref 4.20–5.82)
nRBC: 0 % (ref 0–0)

## 2010-09-19 LAB — COMPREHENSIVE METABOLIC PANEL
ALT: 26 U/L (ref 0–53)
CO2: 22 mEq/L (ref 19–32)
Creatinine, Ser: 1.44 mg/dL — ABNORMAL HIGH (ref 0.50–1.35)
Total Bilirubin: 0.5 mg/dL (ref 0.3–1.2)

## 2010-09-19 LAB — LACTATE DEHYDROGENASE: LDH: 462 U/L — ABNORMAL HIGH (ref 94–250)

## 2010-09-25 ENCOUNTER — Ambulatory Visit (HOSPITAL_COMMUNITY)
Admission: RE | Admit: 2010-09-25 | Discharge: 2010-09-25 | Disposition: A | Discharge status: Home / Self Care | Payer: 59 | Source: Ambulatory Visit | Attending: Gastroenterology | Admitting: Gastroenterology

## 2010-09-25 DIAGNOSIS — R1310 Dysphagia, unspecified: Secondary | ICD-10-CM | POA: Insufficient documentation

## 2010-09-30 ENCOUNTER — Encounter (HOSPITAL_BASED_OUTPATIENT_CLINIC_OR_DEPARTMENT_OTHER): Payer: 59 | Admitting: Internal Medicine

## 2010-09-30 ENCOUNTER — Other Ambulatory Visit: Payer: Self-pay | Admitting: Internal Medicine

## 2010-09-30 DIAGNOSIS — Z5112 Encounter for antineoplastic immunotherapy: Secondary | ICD-10-CM

## 2010-09-30 DIAGNOSIS — C9000 Multiple myeloma not having achieved remission: Secondary | ICD-10-CM

## 2010-09-30 DIAGNOSIS — Z5111 Encounter for antineoplastic chemotherapy: Secondary | ICD-10-CM

## 2010-09-30 LAB — CBC WITH DIFFERENTIAL/PLATELET
Eosinophils Absolute: 0.2 10*3/uL (ref 0.0–0.5)
LYMPH%: 5.7 % — ABNORMAL LOW (ref 14.0–49.0)
MONO#: 3 10*3/uL — ABNORMAL HIGH (ref 0.1–0.9)
NEUT#: 9.6 10*3/uL — ABNORMAL HIGH (ref 1.5–6.5)
Platelets: 127 10*3/uL — ABNORMAL LOW (ref 140–400)
RBC: 3.58 10*6/uL — ABNORMAL LOW (ref 4.20–5.82)
RDW: 14.5 % (ref 11.0–14.6)
WBC: 13.6 10*3/uL — ABNORMAL HIGH (ref 4.0–10.3)
nRBC: 0 % (ref 0–0)

## 2010-09-30 LAB — TECHNOLOGIST REVIEW

## 2010-10-01 LAB — COMPREHENSIVE METABOLIC PANEL
Albumin: 4.3 g/dL (ref 3.5–5.2)
Alkaline Phosphatase: 144 U/L — ABNORMAL HIGH (ref 39–117)
CO2: 24 mEq/L (ref 19–32)
Chloride: 106 mEq/L (ref 96–112)
Glucose, Bld: 97 mg/dL (ref 70–99)
Potassium: 4.2 mEq/L (ref 3.5–5.3)
Sodium: 141 mEq/L (ref 135–145)
Total Protein: 6.6 g/dL (ref 6.0–8.3)

## 2010-10-01 LAB — KAPPA/LAMBDA LIGHT CHAINS: Kappa free light chain: 1.33 mg/dL (ref 0.33–1.94)

## 2010-10-01 LAB — IGG, IGA, IGM: IgG (Immunoglobin G), Serum: 838 mg/dL (ref 650–1600)

## 2010-10-03 ENCOUNTER — Encounter (HOSPITAL_BASED_OUTPATIENT_CLINIC_OR_DEPARTMENT_OTHER): Payer: 59 | Admitting: Internal Medicine

## 2010-10-03 DIAGNOSIS — Z5112 Encounter for antineoplastic immunotherapy: Secondary | ICD-10-CM

## 2010-10-03 DIAGNOSIS — C9000 Multiple myeloma not having achieved remission: Secondary | ICD-10-CM

## 2010-10-03 DIAGNOSIS — Z5111 Encounter for antineoplastic chemotherapy: Secondary | ICD-10-CM

## 2010-10-07 ENCOUNTER — Other Ambulatory Visit: Payer: Self-pay | Admitting: Internal Medicine

## 2010-10-07 ENCOUNTER — Encounter (HOSPITAL_BASED_OUTPATIENT_CLINIC_OR_DEPARTMENT_OTHER): Payer: 59 | Admitting: Internal Medicine

## 2010-10-07 DIAGNOSIS — Z5112 Encounter for antineoplastic immunotherapy: Secondary | ICD-10-CM

## 2010-10-07 DIAGNOSIS — Z5111 Encounter for antineoplastic chemotherapy: Secondary | ICD-10-CM

## 2010-10-07 DIAGNOSIS — C9000 Multiple myeloma not having achieved remission: Secondary | ICD-10-CM

## 2010-10-07 LAB — CBC WITH DIFFERENTIAL/PLATELET
Basophils Absolute: 0 10*3/uL (ref 0.0–0.1)
Eosinophils Absolute: 0.1 10*3/uL (ref 0.0–0.5)
HCT: 37.2 % — ABNORMAL LOW (ref 38.4–49.9)
HGB: 12.3 g/dL — ABNORMAL LOW (ref 13.0–17.1)
LYMPH%: 3.3 % — ABNORMAL LOW (ref 14.0–49.0)
MCH: 32.9 pg (ref 27.2–33.4)
MCV: 99.5 fL — ABNORMAL HIGH (ref 79.3–98.0)
MONO%: 3.4 % (ref 0.0–14.0)
NEUT#: 38 10*3/uL — ABNORMAL HIGH (ref 1.5–6.5)
NEUT%: 93 % — ABNORMAL HIGH (ref 39.0–75.0)
Platelets: 128 10*3/uL — ABNORMAL LOW (ref 140–400)

## 2010-10-07 LAB — COMPREHENSIVE METABOLIC PANEL
ALT: 23 U/L (ref 0–53)
BUN: 25 mg/dL — ABNORMAL HIGH (ref 6–23)
CO2: 23 mEq/L (ref 19–32)
Calcium: 9.4 mg/dL (ref 8.4–10.5)
Chloride: 104 mEq/L (ref 96–112)
Creatinine, Ser: 1.45 mg/dL — ABNORMAL HIGH (ref 0.50–1.35)
Glucose, Bld: 110 mg/dL — ABNORMAL HIGH (ref 70–99)

## 2010-10-07 LAB — LACTATE DEHYDROGENASE: LDH: 521 U/L — ABNORMAL HIGH (ref 94–250)

## 2010-10-07 LAB — TECHNOLOGIST REVIEW

## 2010-10-10 ENCOUNTER — Encounter (HOSPITAL_BASED_OUTPATIENT_CLINIC_OR_DEPARTMENT_OTHER): Payer: 59 | Admitting: Internal Medicine

## 2010-10-10 DIAGNOSIS — C9000 Multiple myeloma not having achieved remission: Secondary | ICD-10-CM

## 2010-10-10 DIAGNOSIS — Z5112 Encounter for antineoplastic immunotherapy: Secondary | ICD-10-CM

## 2010-10-16 NOTE — Op Note (Signed)
  NAMEKUPONO, MARLING NO.:  [id number]  MEDICAL RECORD NO.:  [id number]  LOCATION:  WLEN                         FACILITY:  [facility]  PHYSICIAN:  Danise Edge, M.D.   DATE OF BIRTH:  [birth year]  DATE OF PROCEDURE:  09/25/2010 DATE OF DISCHARGE:                              OPERATIVE REPORT   REFERRING PHYSICIAN:  [name]. Polite, MD  PROCEDURE:  Esophagogastroduodenoscopy with esophageal balloon dilation.  HISTORY:  Mr. Harshaan Whang. Bickford is a 65 year old male born on 1945/03/25.  The patient has intermittent esophageal dysphagia, unassociated with odynophagia.  His barium esophagram with barium tablet showed a smoothly tapering distal esophageal stricture.  The patient is scheduled to undergo a diagnostic esophagogastroduodenoscopy with esophageal balloon stricture dilation.  ENDOSCOPIST:  Danise Edge, MD  PREMEDICATION:  Fentanyl 100 mcg, Versed 7 mg.  PROCEDURE IN DETAIL:  After obtaining informed consent, the patient was placed in the left lateral decubitus position.  The Pentax gastroscope was passed through the posterior hypopharynx into the proximal esophagus without difficulty.  The hypopharynx, larynx, and vocal cords appeared normal.  Esophagoscopy:  The proximal mid and lower segments of the esophageal mucosa appeared normal.  The squamocolumnar junction is noted at 40 cm from the incisor teeth.  There is no endoscopic evidence for the presence of esophageal stricture formation, Barrett esophagus, or esophageal obstruction.  Gastroscopy:  Retroflex view of the gastric cardia and fundus was normal.  The gastric body, antrum, and pylorus appeared normal.  There was a moderate amount of solid food in the gastric fundus and proximal gastric body, suggesting delayed gastric emptying.  The pylorus was patent.  Duodenoscopy:  The duodenal bulb and descending duodenum appeared normal.  Esophageal dilation:  Using the esophageal balloon  dilator, the balloon was inflated to 18 mm at the esophagogastric junction.  After esophageal balloon deflation, there were no signs of esophageal mucosal stricture dilation.  ASSESSMENT: 1. Normal esophagogastroduodenoscopy except for the presence of     residual solid food in the gastric fundus and proximal gastric     body, compatible with diabetic gastroparesis. 2. No signs of esophageal stricture formation, esophageal obstruction,     or Barrett esophagus.  RECOMMENDATIONS:  I suspect Mr. Fricke has an esophageal motility disorder, causing his esophageal dysphagia.  If his symptoms persist following today's esophageal dilation, I will schedule him for an esophageal manometry to look for achalasia.          ______________________________ Danise Edge, M.D.     MJ/MEDQ  D:  09/25/2010  T:  09/25/2010  Job:  [id number]  cc:   [name]. Polite, M.D. Fax: [phone]  Electronically Signed by Danise Edge M.D. on 10/16/2010 04:47:32 PM

## 2010-10-21 ENCOUNTER — Encounter (HOSPITAL_BASED_OUTPATIENT_CLINIC_OR_DEPARTMENT_OTHER): Payer: 59 | Admitting: Internal Medicine

## 2010-10-21 ENCOUNTER — Other Ambulatory Visit: Payer: Self-pay | Admitting: Internal Medicine

## 2010-10-21 DIAGNOSIS — C9000 Multiple myeloma not having achieved remission: Secondary | ICD-10-CM

## 2010-10-21 DIAGNOSIS — Z5112 Encounter for antineoplastic immunotherapy: Secondary | ICD-10-CM

## 2010-10-21 DIAGNOSIS — Z5111 Encounter for antineoplastic chemotherapy: Secondary | ICD-10-CM

## 2010-10-21 LAB — CBC WITH DIFFERENTIAL/PLATELET
Basophils Absolute: 0 10*3/uL (ref 0.0–0.1)
EOS%: 1.3 % (ref 0.0–7.0)
HCT: 32.5 % — ABNORMAL LOW (ref 38.4–49.9)
HGB: 10.7 g/dL — ABNORMAL LOW (ref 13.0–17.1)
MCH: 31.9 pg (ref 27.2–33.4)
MONO#: 1.3 10*3/uL — ABNORMAL HIGH (ref 0.1–0.9)
NEUT#: 10.2 10*3/uL — ABNORMAL HIGH (ref 1.5–6.5)
NEUT%: 80.7 % — ABNORMAL HIGH (ref 39.0–75.0)
RDW: 14.6 % (ref 11.0–14.6)
WBC: 12.6 10*3/uL — ABNORMAL HIGH (ref 4.0–10.3)
lymph#: 1 10*3/uL (ref 0.9–3.3)

## 2010-10-23 ENCOUNTER — Other Ambulatory Visit (HOSPITAL_COMMUNITY): Payer: Self-pay | Admitting: Internal Medicine

## 2010-10-23 DIAGNOSIS — I428 Other cardiomyopathies: Secondary | ICD-10-CM

## 2010-10-23 LAB — COMPREHENSIVE METABOLIC PANEL
ALT: 20 U/L (ref 0–53)
AST: 33 U/L (ref 0–37)
Chloride: 107 mEq/L (ref 96–112)
Creatinine, Ser: 1.89 mg/dL — ABNORMAL HIGH (ref 0.50–1.35)
Sodium: 141 mEq/L (ref 135–145)
Total Bilirubin: 0.5 mg/dL (ref 0.3–1.2)

## 2010-10-23 LAB — IGG, IGA, IGM: IgM, Serum: 19 mg/dL — ABNORMAL LOW (ref 41–251)

## 2010-10-23 LAB — KAPPA/LAMBDA LIGHT CHAINS
Kappa:Lambda Ratio: 0.86 (ref 0.26–1.65)
Lambda Free Lght Chn: 2.42 mg/dL (ref 0.57–2.63)

## 2010-10-23 LAB — BETA 2 MICROGLOBULIN, SERUM: Beta-2 Microglobulin: 5.17 mg/L — ABNORMAL HIGH (ref 1.01–1.73)

## 2010-10-24 ENCOUNTER — Ambulatory Visit (HOSPITAL_COMMUNITY): Payer: 59 | Attending: Internal Medicine | Admitting: Radiology

## 2010-10-24 ENCOUNTER — Encounter (HOSPITAL_BASED_OUTPATIENT_CLINIC_OR_DEPARTMENT_OTHER): Payer: 59 | Admitting: Internal Medicine

## 2010-10-24 DIAGNOSIS — I428 Other cardiomyopathies: Secondary | ICD-10-CM

## 2010-10-24 DIAGNOSIS — I379 Nonrheumatic pulmonary valve disorder, unspecified: Secondary | ICD-10-CM | POA: Insufficient documentation

## 2010-10-24 DIAGNOSIS — C9000 Multiple myeloma not having achieved remission: Secondary | ICD-10-CM

## 2010-10-24 DIAGNOSIS — Z5112 Encounter for antineoplastic immunotherapy: Secondary | ICD-10-CM

## 2010-10-24 DIAGNOSIS — Z5111 Encounter for antineoplastic chemotherapy: Secondary | ICD-10-CM

## 2010-10-24 DIAGNOSIS — I079 Rheumatic tricuspid valve disease, unspecified: Secondary | ICD-10-CM | POA: Insufficient documentation

## 2010-10-28 ENCOUNTER — Other Ambulatory Visit: Payer: Self-pay | Admitting: Internal Medicine

## 2010-10-28 ENCOUNTER — Encounter (HOSPITAL_BASED_OUTPATIENT_CLINIC_OR_DEPARTMENT_OTHER): Payer: 59 | Admitting: Internal Medicine

## 2010-10-28 ENCOUNTER — Encounter (HOSPITAL_COMMUNITY): Payer: Self-pay | Admitting: Internal Medicine

## 2010-10-28 DIAGNOSIS — Z5111 Encounter for antineoplastic chemotherapy: Secondary | ICD-10-CM

## 2010-10-28 DIAGNOSIS — Z5112 Encounter for antineoplastic immunotherapy: Secondary | ICD-10-CM

## 2010-10-28 DIAGNOSIS — C9000 Multiple myeloma not having achieved remission: Secondary | ICD-10-CM

## 2010-10-28 LAB — COMPREHENSIVE METABOLIC PANEL
AST: 36 U/L (ref 0–37)
Albumin: 4 g/dL (ref 3.5–5.2)
Alkaline Phosphatase: 197 U/L — ABNORMAL HIGH (ref 39–117)
Potassium: 4.1 mEq/L (ref 3.5–5.3)
Sodium: 139 mEq/L (ref 135–145)
Total Bilirubin: 0.5 mg/dL (ref 0.3–1.2)
Total Protein: 6.2 g/dL (ref 6.0–8.3)

## 2010-10-28 LAB — CBC WITH DIFFERENTIAL/PLATELET
BASO%: 0.1 % (ref 0.0–2.0)
EOS%: 0.1 % (ref 0.0–7.0)
HCT: 35 % — ABNORMAL LOW (ref 38.4–49.9)
MCH: 32.9 pg (ref 27.2–33.4)
MCHC: 33.4 g/dL (ref 32.0–36.0)
MONO#: 2.2 10*3/uL — ABNORMAL HIGH (ref 0.1–0.9)
RBC: 3.56 10*6/uL — ABNORMAL LOW (ref 4.20–5.82)
RDW: 14.7 % — ABNORMAL HIGH (ref 11.0–14.6)
WBC: 44.1 10*3/uL — ABNORMAL HIGH (ref 4.0–10.3)
lymph#: 1.6 10*3/uL (ref 0.9–3.3)
nRBC: 0 % (ref 0–0)

## 2010-10-28 LAB — TECHNOLOGIST REVIEW

## 2010-10-31 ENCOUNTER — Encounter (HOSPITAL_BASED_OUTPATIENT_CLINIC_OR_DEPARTMENT_OTHER): Payer: 59 | Admitting: Internal Medicine

## 2010-10-31 DIAGNOSIS — C9000 Multiple myeloma not having achieved remission: Secondary | ICD-10-CM

## 2010-10-31 DIAGNOSIS — Z5112 Encounter for antineoplastic immunotherapy: Secondary | ICD-10-CM

## 2010-11-04 ENCOUNTER — Other Ambulatory Visit: Payer: Self-pay | Admitting: Internal Medicine

## 2010-11-04 ENCOUNTER — Encounter (HOSPITAL_BASED_OUTPATIENT_CLINIC_OR_DEPARTMENT_OTHER): Payer: 59 | Admitting: Internal Medicine

## 2010-11-04 DIAGNOSIS — C9000 Multiple myeloma not having achieved remission: Secondary | ICD-10-CM

## 2010-11-04 DIAGNOSIS — Z5112 Encounter for antineoplastic immunotherapy: Secondary | ICD-10-CM

## 2010-11-04 DIAGNOSIS — Z5111 Encounter for antineoplastic chemotherapy: Secondary | ICD-10-CM

## 2010-11-04 LAB — CBC WITH DIFFERENTIAL/PLATELET
BASO%: 0 % (ref 0.0–2.0)
Eosinophils Absolute: 0 10*3/uL (ref 0.0–0.5)
MCHC: 34.1 g/dL (ref 32.0–36.0)
MONO#: 0.7 10*3/uL (ref 0.1–0.9)
NEUT#: 8.6 10*3/uL — ABNORMAL HIGH (ref 1.5–6.5)
Platelets: 82 10*3/uL — ABNORMAL LOW (ref 140–400)
RBC: 3.41 10*6/uL — ABNORMAL LOW (ref 4.20–5.82)
RDW: 15.1 % — ABNORMAL HIGH (ref 11.0–14.6)
WBC: 9.8 10*3/uL (ref 4.0–10.3)
lymph#: 0.4 10*3/uL — ABNORMAL LOW (ref 0.9–3.3)

## 2010-11-04 LAB — COMPREHENSIVE METABOLIC PANEL
ALT: 23 U/L (ref 0–53)
Albumin: 3.9 g/dL (ref 3.5–5.2)
CO2: 24 mEq/L (ref 19–32)
Chloride: 102 mEq/L (ref 96–112)
Glucose, Bld: 143 mg/dL — ABNORMAL HIGH (ref 70–99)
Potassium: 4.3 mEq/L (ref 3.5–5.3)
Sodium: 140 mEq/L (ref 135–145)
Total Protein: 6.7 g/dL (ref 6.0–8.3)

## 2010-11-04 LAB — LACTATE DEHYDROGENASE: LDH: 465 U/L — ABNORMAL HIGH (ref 94–250)

## 2010-11-18 ENCOUNTER — Encounter (HOSPITAL_BASED_OUTPATIENT_CLINIC_OR_DEPARTMENT_OTHER): Payer: 59 | Admitting: Internal Medicine

## 2010-11-18 ENCOUNTER — Other Ambulatory Visit: Payer: Self-pay | Admitting: Internal Medicine

## 2010-11-18 DIAGNOSIS — Z5112 Encounter for antineoplastic immunotherapy: Secondary | ICD-10-CM

## 2010-11-18 DIAGNOSIS — C9000 Multiple myeloma not having achieved remission: Secondary | ICD-10-CM

## 2010-11-18 DIAGNOSIS — Z5111 Encounter for antineoplastic chemotherapy: Secondary | ICD-10-CM

## 2010-11-18 DIAGNOSIS — Z79899 Other long term (current) drug therapy: Secondary | ICD-10-CM

## 2010-11-18 LAB — CBC WITH DIFFERENTIAL/PLATELET
BASO%: 1.7 % (ref 0.0–2.0)
EOS%: 5.1 % (ref 0.0–7.0)
HCT: 36.3 % — ABNORMAL LOW (ref 38.4–49.9)
LYMPH%: 15.3 % (ref 14.0–49.0)
MCH: 32.2 pg (ref 27.2–33.4)
MCHC: 33.3 g/dL (ref 32.0–36.0)
MONO#: 0.7 10*3/uL (ref 0.1–0.9)
NEUT%: 38.9 % — ABNORMAL LOW (ref 39.0–75.0)
RBC: 3.76 10*6/uL — ABNORMAL LOW (ref 4.20–5.82)
WBC: 1.8 10*3/uL — ABNORMAL LOW (ref 4.0–10.3)
lymph#: 0.3 10*3/uL — ABNORMAL LOW (ref 0.9–3.3)
nRBC: 0 % (ref 0–0)

## 2010-11-18 LAB — COMPREHENSIVE METABOLIC PANEL
Albumin: 4.2 g/dL (ref 3.5–5.2)
Alkaline Phosphatase: 121 U/L — ABNORMAL HIGH (ref 39–117)
BUN: 19 mg/dL (ref 6–23)
Creatinine, Ser: 1.23 mg/dL (ref 0.50–1.35)
Glucose, Bld: 88 mg/dL (ref 70–99)
Total Bilirubin: 0.6 mg/dL (ref 0.3–1.2)

## 2010-11-25 ENCOUNTER — Other Ambulatory Visit: Payer: Self-pay | Admitting: Internal Medicine

## 2010-11-25 ENCOUNTER — Encounter (HOSPITAL_BASED_OUTPATIENT_CLINIC_OR_DEPARTMENT_OTHER): Payer: 59 | Admitting: Internal Medicine

## 2010-11-25 DIAGNOSIS — Z5112 Encounter for antineoplastic immunotherapy: Secondary | ICD-10-CM

## 2010-11-25 DIAGNOSIS — Z5111 Encounter for antineoplastic chemotherapy: Secondary | ICD-10-CM

## 2010-11-25 DIAGNOSIS — C9000 Multiple myeloma not having achieved remission: Secondary | ICD-10-CM

## 2010-11-25 LAB — GLUCOSE, CAPILLARY
Glucose-Capillary: 103 mg/dL — ABNORMAL HIGH (ref 70–99)
Glucose-Capillary: 103 mg/dL — ABNORMAL HIGH (ref 70–99)
Glucose-Capillary: 127 mg/dL — ABNORMAL HIGH (ref 70–99)
Glucose-Capillary: 131 mg/dL — ABNORMAL HIGH (ref 70–99)
Glucose-Capillary: 136 mg/dL — ABNORMAL HIGH (ref 70–99)
Glucose-Capillary: 146 mg/dL — ABNORMAL HIGH (ref 70–99)
Glucose-Capillary: 147 mg/dL — ABNORMAL HIGH (ref 70–99)
Glucose-Capillary: 171 mg/dL — ABNORMAL HIGH (ref 70–99)
Glucose-Capillary: 220 mg/dL — ABNORMAL HIGH (ref 70–99)
Glucose-Capillary: 75 mg/dL (ref 70–99)
Glucose-Capillary: 87 mg/dL (ref 70–99)
Glucose-Capillary: 97 mg/dL (ref 70–99)

## 2010-11-25 LAB — UIFE/LIGHT CHAINS/TP QN, 24-HR UR
Alpha 1, Urine: DETECTED — AB
Alpha 2, Urine: DETECTED — AB
Free Kappa Lt Chains,Ur: 9.39 mg/dL — ABNORMAL HIGH (ref 0.04–1.51)
Free Kappa/Lambda Ratio: 0.02 ratio — ABNORMAL LOW (ref 0.46–4.00)
Free Lambda Excretion/Day: 17750.5 mg/d
Free Lt Chn Excr Rate: 307.52 mg/d
Gamma Globulin, Urine: DETECTED — AB
Time: 24 hours
Volume, Urine: 3275 mL

## 2010-11-25 LAB — POCT I-STAT, CHEM 8
BUN: 41 mg/dL — ABNORMAL HIGH (ref 6–23)
Calcium, Ion: 1.91 mmol/L — ABNORMAL HIGH (ref 1.12–1.32)
Creatinine, Ser: 2.4 mg/dL — ABNORMAL HIGH (ref 0.4–1.5)
Glucose, Bld: 71 mg/dL (ref 70–99)
TCO2: 34 mmol/L (ref 0–100)

## 2010-11-25 LAB — RAPID URINE DRUG SCREEN, HOSP PERFORMED
Cocaine: NOT DETECTED
Opiates: POSITIVE — AB
Tetrahydrocannabinol: NOT DETECTED

## 2010-11-25 LAB — CBC WITH DIFFERENTIAL/PLATELET
BASO%: 0.4 % (ref 0.0–2.0)
HCT: 35 % — ABNORMAL LOW (ref 38.4–49.9)
LYMPH%: 23.9 % (ref 14.0–49.0)
MCH: 32.3 pg (ref 27.2–33.4)
MCHC: 34 g/dL (ref 32.0–36.0)
MCV: 95.1 fL (ref 79.3–98.0)
MONO#: 0.5 10*3/uL (ref 0.1–0.9)
MONO%: 18.9 % — ABNORMAL HIGH (ref 0.0–14.0)
NEUT%: 53.9 % (ref 39.0–75.0)
Platelets: 156 10*3/uL (ref 140–400)
WBC: 2.8 10*3/uL — ABNORMAL LOW (ref 4.0–10.3)

## 2010-11-25 LAB — IMMUNOFIXATION ELECTROPHORESIS
IgG (Immunoglobin G), Serum: 1180 mg/dL (ref 694–1618)
Total Protein ELP: 10.4 g/dL — ABNORMAL HIGH (ref 6.0–8.3)

## 2010-11-25 LAB — ETHANOL: Alcohol, Ethyl (B): <5 mg/dL (ref 0–10)

## 2010-11-25 LAB — TSH: TSH: 2.022 u[IU]/mL (ref 0.350–4.500)

## 2010-11-25 LAB — BASIC METABOLIC PANEL
BUN: 42 mg/dL — ABNORMAL HIGH (ref 6–23)
BUN: 45 mg/dL — ABNORMAL HIGH (ref 6–23)
CO2: 26 mEq/L (ref 19–32)
CO2: 28 mEq/L (ref 19–32)
Calcium: 11.5 mg/dL — ABNORMAL HIGH (ref 8.4–10.5)
Chloride: 103 mEq/L (ref 96–112)
Chloride: 104 mEq/L (ref 96–112)
Creatinine, Ser: 2.29 mg/dL — ABNORMAL HIGH (ref 0.4–1.5)
Creatinine, Ser: 2.3 mg/dL — ABNORMAL HIGH (ref 0.4–1.5)
Creatinine, Ser: 2.45 mg/dL — ABNORMAL HIGH (ref 0.4–1.5)
GFR calc Af Amer: 33 mL/min — ABNORMAL LOW (ref >60)
GFR calc Af Amer: 35 mL/min — ABNORMAL LOW (ref >60)
GFR calc non Af Amer: 29 mL/min — ABNORMAL LOW (ref >60)
Glucose, Bld: 104 mg/dL — ABNORMAL HIGH (ref 70–99)

## 2010-11-25 LAB — CBC
HCT: 32.9 % — ABNORMAL LOW (ref 39.0–52.0)
Hemoglobin: 13.5 g/dL (ref 13.0–17.0)
MCHC: 33.7 g/dL (ref 30.0–36.0)
MCHC: 34.1 g/dL (ref 30.0–36.0)
MCV: 100 fL (ref 78.0–100.0)
MCV: 100.2 fL — ABNORMAL HIGH (ref 78.0–100.0)
MCV: 100.2 fL — ABNORMAL HIGH (ref 78.0–100.0)
MCV: 100.8 fL — ABNORMAL HIGH (ref 78.0–100.0)
Platelets: 207 10*3/uL (ref 150–400)
Platelets: 208 10*3/uL (ref 150–400)
Platelets: 211 10*3/uL (ref 150–400)
Platelets: 218 10*3/uL (ref 150–400)
RBC: 3.49 MIL/uL — ABNORMAL LOW (ref 4.22–5.81)
RBC: 3.6 MIL/uL — ABNORMAL LOW (ref 4.22–5.81)
RBC: 3.98 MIL/uL — ABNORMAL LOW (ref 4.22–5.81)
RDW: 13.8 % (ref 11.5–15.5)
RDW: 13.9 % (ref 11.5–15.5)
RDW: 14.2 % (ref 11.5–15.5)
WBC: 3.1 10*3/uL — ABNORMAL LOW (ref 4.0–10.5)
WBC: 3.9 10*3/uL — ABNORMAL LOW (ref 4.0–10.5)
WBC: 4 10*3/uL (ref 4.0–10.5)

## 2010-11-25 LAB — COMPREHENSIVE METABOLIC PANEL
ALT: 78 U/L — ABNORMAL HIGH (ref 0–53)
AST: 64 U/L — ABNORMAL HIGH (ref 0–37)
AST: 73 U/L — ABNORMAL HIGH (ref 0–37)
AST: 30 U/L (ref 0–37)
Albumin: 2.1 g/dL — ABNORMAL LOW (ref 3.5–5.2)
BUN: 43 mg/dL — ABNORMAL HIGH (ref 6–23)
BUN: 24 mg/dL — ABNORMAL HIGH (ref 6–23)
CO2: 25 mEq/L (ref 19–32)
Calcium: 8.1 mg/dL — ABNORMAL LOW (ref 8.4–10.5)
Calcium: 9.1 mg/dL (ref 8.4–10.5)
Calcium: 9 mg/dL (ref 8.4–10.5)
Chloride: 104 mEq/L (ref 96–112)
Creatinine, Ser: 1.76 mg/dL — ABNORMAL HIGH (ref 0.4–1.5)
Creatinine, Ser: 2.42 mg/dL — ABNORMAL HIGH (ref 0.4–1.5)
Creatinine, Ser: 1.31 mg/dL (ref 0.50–1.35)
GFR calc Af Amer: 33 mL/min — ABNORMAL LOW (ref >60)
GFR calc Af Amer: 48 mL/min — ABNORMAL LOW (ref >60)
GFR calc non Af Amer: 27 mL/min — ABNORMAL LOW (ref >60)
Sodium: 140 mEq/L (ref 135–145)
Total Protein: 10.3 g/dL — ABNORMAL HIGH (ref 6.0–8.3)
Total Protein: 10.3 g/dL — ABNORMAL HIGH (ref 6.0–8.3)

## 2010-11-25 LAB — CHROMOSOME ANALYSIS, BONE MARROW

## 2010-11-25 LAB — URINALYSIS, ROUTINE W REFLEX MICROSCOPIC
Bilirubin Urine: NEGATIVE
Glucose, UA: NEGATIVE mg/dL
Ketones, ur: NEGATIVE mg/dL
Leukocytes, UA: NEGATIVE
pH: 6 (ref 5.0–8.0)

## 2010-11-25 LAB — HEPATIC FUNCTION PANEL
ALT: 71 U/L — ABNORMAL HIGH (ref 0–53)
AST: 63 U/L — ABNORMAL HIGH (ref 0–37)
Albumin: 2.2 g/dL — ABNORMAL LOW (ref 3.5–5.2)
Alkaline Phosphatase: 103 U/L (ref 39–117)
Total Bilirubin: 0.7 mg/dL (ref 0.3–1.2)

## 2010-11-25 LAB — DIFFERENTIAL
Eosinophils Absolute: 0 10*3/uL (ref 0.0–0.7)
Lymphs Abs: 2.1 10*3/uL (ref 0.7–4.0)
Monocytes Absolute: 0.4 10*3/uL (ref 0.1–1.0)
Monocytes Relative: 10 % (ref 3–12)
Neutro Abs: 1.3 10*3/uL — ABNORMAL LOW (ref 1.7–7.7)
Neutrophils Relative %: 33 % — ABNORMAL LOW (ref 43–77)
Neutrophils Relative %: 58 % (ref 43–77)

## 2010-11-25 LAB — PROTEIN, URINE, 24 HOUR
Collection Interval-UPROT: 24 hours
Protein, 24H Urine: 4233 mg/d — ABNORMAL HIGH (ref 50–100)
Protein, 24H Urine: 4880 mg/d — ABNORMAL HIGH (ref 50–100)

## 2010-11-25 LAB — T4, FREE: Free T4: 1 ng/dL (ref 0.89–1.80)

## 2010-11-25 LAB — TISSUE HYBRIDIZATION (BONE MARROW)-NCBH

## 2010-11-25 LAB — ACETAMINOPHEN LEVEL: Acetaminophen (Tylenol), Serum: <10 ug/mL — ABNORMAL LOW (ref 10–30)

## 2010-11-25 LAB — LACTATE DEHYDROGENASE: LDH: 252 U/L — ABNORMAL HIGH (ref 94–250)

## 2010-11-25 LAB — BONE MARROW EXAM

## 2010-11-27 LAB — CBC
RBC: 3.61 MIL/uL — ABNORMAL LOW (ref 4.22–5.81)
WBC: 3.3 10*3/uL — ABNORMAL LOW (ref 4.0–10.5)

## 2010-11-27 LAB — URINE MICROSCOPIC-ADD ON

## 2010-11-27 LAB — URINALYSIS, ROUTINE W REFLEX MICROSCOPIC
Nitrite: NEGATIVE
Specific Gravity, Urine: 1.02 (ref 1.005–1.030)
Urobilinogen, UA: 2 mg/dL — ABNORMAL HIGH (ref 0.0–1.0)

## 2010-11-27 LAB — COMPREHENSIVE METABOLIC PANEL
ALT: 113 U/L — ABNORMAL HIGH (ref 0–53)
AST: 74 U/L — ABNORMAL HIGH (ref 0–37)
CO2: 23 mEq/L (ref 19–32)
Calcium: 7.9 mg/dL — ABNORMAL LOW (ref 8.4–10.5)
Chloride: 101 mEq/L (ref 96–112)
GFR calc Af Amer: 59 mL/min — ABNORMAL LOW (ref >60)
GFR calc non Af Amer: 49 mL/min — ABNORMAL LOW (ref >60)
Sodium: 134 mEq/L — ABNORMAL LOW (ref 135–145)
Total Bilirubin: 0.8 mg/dL (ref 0.3–1.2)

## 2010-11-27 LAB — URINE CULTURE
Colony Count: NO GROWTH
Culture: NO GROWTH

## 2010-11-27 LAB — DIFFERENTIAL
Eosinophils Absolute: 0.1 10*3/uL (ref 0.0–0.7)
Eosinophils Relative: 3 % (ref 0–5)
Lymphs Abs: 0.6 10*3/uL — ABNORMAL LOW (ref 0.7–4.0)

## 2010-11-28 ENCOUNTER — Encounter (HOSPITAL_BASED_OUTPATIENT_CLINIC_OR_DEPARTMENT_OTHER): Payer: 59 | Admitting: Internal Medicine

## 2010-11-28 ENCOUNTER — Other Ambulatory Visit: Payer: Self-pay | Admitting: Internal Medicine

## 2010-11-28 ENCOUNTER — Other Ambulatory Visit (HOSPITAL_COMMUNITY)
Admission: RE | Admit: 2010-11-28 | Discharge: 2010-11-28 | Disposition: A | Discharge status: Home / Self Care | Payer: 59 | Source: Ambulatory Visit | Attending: Internal Medicine | Admitting: Internal Medicine

## 2010-11-28 DIAGNOSIS — D649 Anemia, unspecified: Secondary | ICD-10-CM | POA: Insufficient documentation

## 2010-11-28 DIAGNOSIS — D72819 Decreased white blood cell count, unspecified: Secondary | ICD-10-CM | POA: Insufficient documentation

## 2010-11-28 DIAGNOSIS — C9000 Multiple myeloma not having achieved remission: Secondary | ICD-10-CM

## 2010-11-28 LAB — DIFFERENTIAL
Basophils Relative: 1 % (ref 0–1)
Eosinophils Absolute: 0.2 10*3/uL (ref 0.0–0.7)
Eosinophils Relative: 5 % (ref 0–5)
Monocytes Relative: 21 % — ABNORMAL HIGH (ref 3–12)
Neutrophils Relative %: 49 % (ref 43–77)

## 2010-11-28 LAB — CBC
MCH: 32.1 pg (ref 26.0–34.0)
MCHC: 32.7 g/dL (ref 30.0–36.0)
Platelets: 202 10*3/uL (ref 150–400)
RBC: 3.68 MIL/uL — ABNORMAL LOW (ref 4.22–5.81)
RDW: 13.2 % (ref 11.5–15.5)

## 2010-12-02 ENCOUNTER — Encounter (HOSPITAL_BASED_OUTPATIENT_CLINIC_OR_DEPARTMENT_OTHER): Payer: 59 | Admitting: Internal Medicine

## 2010-12-02 ENCOUNTER — Other Ambulatory Visit: Payer: Self-pay | Admitting: Internal Medicine

## 2010-12-02 DIAGNOSIS — C9000 Multiple myeloma not having achieved remission: Secondary | ICD-10-CM

## 2010-12-02 DIAGNOSIS — D472 Monoclonal gammopathy: Secondary | ICD-10-CM

## 2010-12-02 DIAGNOSIS — Z5111 Encounter for antineoplastic chemotherapy: Secondary | ICD-10-CM

## 2010-12-02 LAB — CBC WITH DIFFERENTIAL/PLATELET
BASO%: 0.3 % (ref 0.0–2.0)
EOS%: 2.9 % (ref 0.0–7.0)
MCHC: 34.3 g/dL (ref 32.0–36.0)
MONO#: 0.5 10*3/uL (ref 0.1–0.9)
RBC: 3.71 10*6/uL — ABNORMAL LOW (ref 4.20–5.82)
RDW: 13.7 % (ref 11.0–14.6)
WBC: 3.3 10*3/uL — ABNORMAL LOW (ref 4.0–10.3)
lymph#: 0.6 10*3/uL — ABNORMAL LOW (ref 0.9–3.3)

## 2010-12-02 LAB — COMPREHENSIVE METABOLIC PANEL
ALT: 15 U/L (ref 0–53)
AST: 25 U/L (ref 0–37)
CO2: 26 mEq/L (ref 19–32)
Calcium: 9.4 mg/dL (ref 8.4–10.5)
Chloride: 105 mEq/L (ref 96–112)
Sodium: 141 mEq/L (ref 135–145)
Total Protein: 6.9 g/dL (ref 6.0–8.3)

## 2010-12-02 LAB — LACTATE DEHYDROGENASE: LDH: 236 U/L (ref 94–250)

## 2010-12-05 ENCOUNTER — Encounter: Payer: Self-pay | Admitting: Internal Medicine

## 2010-12-08 LAB — PROTIME-INR
INR: 1
Prothrombin Time: 13.5

## 2010-12-08 LAB — CBC
MCHC: 34.5
RDW: 13.5

## 2010-12-09 ENCOUNTER — Encounter (HOSPITAL_BASED_OUTPATIENT_CLINIC_OR_DEPARTMENT_OTHER): Payer: 59 | Admitting: Internal Medicine

## 2010-12-09 ENCOUNTER — Other Ambulatory Visit: Payer: Self-pay | Admitting: Internal Medicine

## 2010-12-09 DIAGNOSIS — Z5111 Encounter for antineoplastic chemotherapy: Secondary | ICD-10-CM

## 2010-12-09 DIAGNOSIS — C9000 Multiple myeloma not having achieved remission: Secondary | ICD-10-CM

## 2010-12-09 DIAGNOSIS — Z5112 Encounter for antineoplastic immunotherapy: Secondary | ICD-10-CM

## 2010-12-09 LAB — COMPREHENSIVE METABOLIC PANEL
Albumin: 4.3 g/dL (ref 3.5–5.2)
Alkaline Phosphatase: 141 U/L — ABNORMAL HIGH (ref 39–117)
CO2: 24 mEq/L (ref 19–32)
Calcium: 9.6 mg/dL (ref 8.4–10.5)
Chloride: 105 mEq/L (ref 96–112)
Glucose, Bld: 117 mg/dL — ABNORMAL HIGH (ref 70–99)
Potassium: 4.7 mEq/L (ref 3.5–5.3)
Sodium: 141 mEq/L (ref 135–145)
Total Protein: 6.9 g/dL (ref 6.0–8.3)

## 2010-12-09 LAB — CBC WITH DIFFERENTIAL/PLATELET
BASO%: 0.4 % (ref 0.0–2.0)
EOS%: 4.1 % (ref 0.0–7.0)
MCH: 33.1 pg (ref 27.2–33.4)
MCHC: 34.3 g/dL (ref 32.0–36.0)
NEUT%: 63.9 % (ref 39.0–75.0)
RDW: 13.2 % (ref 11.0–14.6)
lymph#: 0.6 10*3/uL — ABNORMAL LOW (ref 0.9–3.3)

## 2010-12-16 ENCOUNTER — Other Ambulatory Visit: Payer: Self-pay | Admitting: Internal Medicine

## 2010-12-16 ENCOUNTER — Encounter (HOSPITAL_BASED_OUTPATIENT_CLINIC_OR_DEPARTMENT_OTHER): Payer: 59 | Admitting: Internal Medicine

## 2010-12-16 DIAGNOSIS — Z5111 Encounter for antineoplastic chemotherapy: Secondary | ICD-10-CM

## 2010-12-16 DIAGNOSIS — C9000 Multiple myeloma not having achieved remission: Secondary | ICD-10-CM

## 2010-12-16 DIAGNOSIS — Z5112 Encounter for antineoplastic immunotherapy: Secondary | ICD-10-CM

## 2010-12-16 LAB — CBC WITH DIFFERENTIAL/PLATELET
BASO%: 0.6 % (ref 0.0–2.0)
LYMPH%: 22.7 % (ref 14.0–49.0)
MCHC: 34 g/dL (ref 32.0–36.0)
MCV: 96.6 fL (ref 79.3–98.0)
MONO#: 0.5 10*3/uL (ref 0.1–0.9)
MONO%: 14.8 % — ABNORMAL HIGH (ref 0.0–14.0)
Platelets: 190 10*3/uL (ref 140–400)
RBC: 3.73 10*6/uL — ABNORMAL LOW (ref 4.20–5.82)
RDW: 13.3 % (ref 11.0–14.6)
WBC: 3.3 10*3/uL — ABNORMAL LOW (ref 4.0–10.3)

## 2010-12-16 LAB — COMPREHENSIVE METABOLIC PANEL
ALT: 26 U/L (ref 0–53)
Alkaline Phosphatase: 198 U/L — ABNORMAL HIGH (ref 39–117)
Sodium: 143 mEq/L (ref 135–145)
Total Bilirubin: 0.4 mg/dL (ref 0.3–1.2)
Total Protein: 6.9 g/dL (ref 6.0–8.3)

## 2010-12-30 ENCOUNTER — Ambulatory Visit (HOSPITAL_BASED_OUTPATIENT_CLINIC_OR_DEPARTMENT_OTHER): Payer: 59 | Admitting: Internal Medicine

## 2010-12-30 DIAGNOSIS — C9000 Multiple myeloma not having achieved remission: Secondary | ICD-10-CM

## 2010-12-30 NOTE — Progress Notes (Deleted)
  No images are attached to the encounter. No scans are attached to the encounter. No scans are attached to the encounter. [facility] OFFICE PROGRESS NOTE  No primary provider on file. No primary provider on file.  DIAGNOSIS: ***  PRIOR THERAPY:  CURRENT THERAPY:  INTERVAL HISTORY: Rodney Green Rodney Green 65 y.o. male returns for *** regular *** visit for followup of ***   MEDICAL HISTORY: Past Medical History  Diagnosis Date  . Allergy   . Diabetes mellitus   . Hypertension   . Hepatitis C   . Multiple myeloma     ALLERGIES:  is allergic to ace inhibitors.  MEDICATIONS: [name] does not currently have medications on file.  SURGICAL HISTORY: No past surgical history on file.  REVIEW OF SYSTEMS:  {Ros - complete:30496}   PHYSICAL EXAMINATION: .phy ECOG PERFORMANCE STATUS: {CHL ONC ECOG ZO:[id number]}  Filed Vitals:   12/30/10 1005  BP: 150/97  Pulse: 72  Temp: 97.4 F (36.3 C)    LABORATORY DATA: Lab Results  Component Value Date   WBC 3.0* 11/28/2010   HGB 12.3* 12/16/2010   HCT 36.0* 12/16/2010   MCV 96.6 12/16/2010   PLT 190 12/16/2010      Chemistry      Component Value Date/Time   NA 143 12/16/2010 1438   K 5.0 12/16/2010 1438   CL 106 12/16/2010 1438   CO2 29 12/16/2010 1438   BUN 25* 12/16/2010 1438   CREATININE 1.37* 12/16/2010 1438   GLU 293* 08/12/2009 1322      Component Value Date/Time   CALCIUM 10.0 12/16/2010 1438   ALKPHOS 198* 12/16/2010 1438   AST 27 12/16/2010 1438   ALT 26 12/16/2010 1438   BILITOT 0.4 12/16/2010 1438       RADIOGRAPHIC STUDIES: ***  ASSESSMENT: ***   PLAN: ***   All questions were answered. The patient knows to call the clinic with any problems, questions or concerns. We can certainly see the patient much sooner if necessary.  I spent {CHL ONC TIME VISIT - WJXBJ:[id number]} counseling the patient face to face. The total time spent in the appointment was {CHL ONC TIME VISIT -  YQMVH:[id number]}.

## 2010-12-30 NOTE — Progress Notes (Signed)
OFFICE PROGRESS NOTE  No primary provider on file. No primary provider on file.  DIAGNOSIS: Multiple myeloma IgA subtype diagnosed in November, 2009.  PRIOR THERAPY:  1. Status post seven cycles of systemic chemotherapy with Revlimid and low-dose Betatron with partial response. The last dose was given June, 2010. 2. Status post chemotherapy with weekly Velcade in a patient on Decadron 40 mg on a weekly basis, status post 75 weekly doses.  The last dose was given May 04, 2008. 3. Status post treatment again with Velcade and Decadron. The patient was awaiting stem cell transplant at [facility].  Last dose was given Jul 16, 2009. 4. The patient was not considered a good candidate for autologous stem cell transplant at that time secondary to poor mobilization of his stem cells.  5. Status post six cycles of systemic chemotherapy with Velcade, Cytoxan and Betatron.  Last dose was given March 25, 2010.  This continued secondary to his disease progression. 6. Status post ten cycles of systemic chemotherapy with Velcade. Doxil and Decadron.    Last dose was given on 10/31/2010.  Currently on hold because of concern about cardiac toxicity.  CURRENT THERAPY: None but waiting autologous peripheral blood stem cell transplant.  INTERVAL HISTORY: Rodney Green 65 y.o. male returns for his routine followup visit. He was accompanied by his wife. The patient is feeling fine he denied having any significant complaints. He has no fever or chills, he has no nausea or vomiting. No weight loss or night sweats. He was seen by Dr. Garth Bigness at [facility] for evaluation of autologous peripheral blood stem cell transplant. The patient has been awaiting her final recommendations. He has a bone marrow biopsy and aspirate performed on 11/28/2010. It showed normal cellular bone marrow for age with trilineage hematopoiesis and 2% plasma cells.  MEDICAL  HISTORY: Past Medical History  Diagnosis Date  . Allergy   . Diabetes mellitus   . Hypertension   . Hepatitis C   . Multiple myeloma     ALLERGIES:  is allergic to ace inhibitors.  MEDICATIONS: Rodney Green does not currently have medications on file.  SURGICAL HISTORY: No past surgical history on file.  REVIEW OF SYSTEMS:  A comprehensive review of systems was negative.   PHYSICAL EXAMINATION: General appearance: alert, cooperative and no distress Head: Normocephalic, without obvious abnormality, atraumatic Neck: no adenopathy and supple, symmetrical, trachea midline Lymph nodes: Cervical, supraclavicular, and axillary nodes normal. Resp: clear to auscultation bilaterally Cardio: regular rate and rhythm, S1, S2 normal, no murmur, click, rub or gallop GI: soft, non-tender; bowel sounds normal; no masses,  no organomegaly Extremities: extremities normal, atraumatic, no cyanosis or edema Neurologic: Alert and oriented X 3, normal strength and tone. Normal symmetric reflexes. Normal coordination and gait  ECOG PERFORMANCE STATUS: 1 - Symptomatic but completely ambulatory  Filed Vitals:   12/30/10 1005  BP: 150/97  Pulse: 72  Temp: 97.4 F (36.3 C)    LABORATORY DATA: Lab Results  Component Value Date   WBC 3.0* 11/28/2010   HGB 12.3* 12/16/2010   HCT 36.0* 12/16/2010   MCV 96.6 12/16/2010   PLT 190 12/16/2010      Chemistry      Component Value Date/Time   NA 143 12/16/2010 1438   K 5.0 12/16/2010 1438   CL 106 12/16/2010 1438   CO2 29 12/16/2010 1438   BUN 25* 12/16/2010 1438   CREATININE 1.37* 12/16/2010 1438   GLU 293*  08/12/2009 1322      Component Value Date/Time   CALCIUM 10.0 12/16/2010 1438   ALKPHOS 198* 12/16/2010 1438   AST 27 12/16/2010 1438   ALT 26 12/16/2010 1438   BILITOT 0.4 12/16/2010 1438       RADIOGRAPHIC STUDIES: None  ASSESSMENT: This is a very pleasant 65 years old [place] American male with history of multiple myeloma, IgA  subtype diagnosed in November of 2009. The patient is status post several chemotherapy regimens. He has a very good response to the last treatment with Velcade, Doxil and Decadron.    PLAN: I would wait for the final recommendation from Dr. Barbaraann Boys regarding his eligibility for stem cell transplant. I would see the patient back for followup visit in 2 months for reevaluation with repeat myeloma Panel. All questions were answered. The patient knows to call the clinic with any problems, questions or concerns. We can certainly see the patient much sooner if necessary.

## 2010-12-30 NOTE — Progress Notes (Deleted)
No images are attached to the encounter. No scans are attached to the encounter. No scans are attached to the encounter. [facility] OFFICE PROGRESS NOTE  No primary provider on file. No primary provider on file.  DIAGNOSIS: ***  PRIOR THERAPY:  CURRENT THERAPY:  INTERVAL HISTORY: Rodney Green 65 y.o. male returns for *** regular *** visit for followup of ***   MEDICAL HISTORY: Past Medical History  Diagnosis Date  . Allergy   . Diabetes mellitus   . Hypertension   . Hepatitis C   . Multiple myeloma     ALLERGIES:  is allergic to ace inhibitors.  MEDICATIONS: Mr. Valliant does not currently have medications on file.  SURGICAL HISTORY: No past surgical history on file.  REVIEW OF SYSTEMS:  {Ros - complete:30496}   PHYSICAL EXAMINATION: ECOG PERFORMANCE STATUS: {CHL ONC ECOG PS:[id number]}  There were no vitals filed for this visit.  LABORATORY DATA: Lab Results  Component Value Date   WBC 3.0* 11/28/2010   HGB 12.3* 12/16/2010   HCT 36.0* 12/16/2010   MCV 96.6 12/16/2010   PLT 190 12/16/2010      Chemistry      Component Value Date/Time   NA 143 12/16/2010 1438   K 5.0 12/16/2010 1438   CL 106 12/16/2010 1438   CO2 29 12/16/2010 1438   BUN 25* 12/16/2010 1438   CREATININE 1.37* 12/16/2010 1438   GLU 293* 08/12/2009 1322      Component Value Date/Time   CALCIUM 10.0 12/16/2010 1438   ALKPHOS 198* 12/16/2010 1438   AST 27 12/16/2010 1438   ALT 26 12/16/2010 1438   BILITOT 0.4 12/16/2010 1438       RADIOGRAPHIC STUDIES: ***  ASSESSMENT: ***   PLAN: ***   All questions were answered. The patient knows to call the clinic with any problems, questions or concerns. We can certainly see the patient much sooner if necessary.   I spent {CHL ONC TIME VISIT - HYQMV:[id number]} counseling the patient face to face. The total time spent in the appointment was {CHL ONC TIME VISIT - WUXLK:[id number]}.

## 2010-12-31 ENCOUNTER — Ambulatory Visit
Admission: RE | Admit: 2010-12-31 | Discharge: 2010-12-31 | Disposition: A | Discharge status: Home / Self Care | Payer: 59 | Source: Ambulatory Visit | Attending: Internal Medicine | Admitting: Internal Medicine

## 2010-12-31 ENCOUNTER — Other Ambulatory Visit: Payer: Self-pay | Admitting: Internal Medicine

## 2010-12-31 DIAGNOSIS — R52 Pain, unspecified: Secondary | ICD-10-CM

## 2011-01-08 ENCOUNTER — Other Ambulatory Visit: Payer: Self-pay | Admitting: Internal Medicine

## 2011-01-09 ENCOUNTER — Other Ambulatory Visit: Payer: Self-pay | Admitting: *Deleted

## 2011-01-09 MED ORDER — OXYCODONE HCL 40 MG PO TB12: 40.0000 mg | ORAL_TABLET | Freq: Two times a day (BID) | ORAL | Status: DC

## 2011-02-05 ENCOUNTER — Other Ambulatory Visit: Payer: Self-pay | Admitting: Internal Medicine

## 2011-02-05 MED ORDER — OXYCODONE HCL 40 MG PO TB12: 40.0000 mg | ORAL_TABLET | Freq: Two times a day (BID) | ORAL | Status: DC

## 2011-02-05 NOTE — Telephone Encounter (Signed)
Spoke to [facility] at [phone] and told her rx was ready for pickup

## 2011-02-26 ENCOUNTER — Other Ambulatory Visit: Payer: Self-pay | Admitting: *Deleted

## 2011-02-26 NOTE — Progress Notes (Signed)
Received fax from Electa Sniff at [facility] for pt to begin getting labs every MWF (CBC and CMET).  Onc tx schedule filled out to schedule lab appts.  Other guidelines and parameters from Electa Sniff at [facility] at [employer] for reference (ie transfusion parameters, abx, neupogen guidelines).  SLJ

## 2011-03-02 ENCOUNTER — Other Ambulatory Visit: Payer: Self-pay | Admitting: Internal Medicine

## 2011-03-02 ENCOUNTER — Ambulatory Visit (HOSPITAL_BASED_OUTPATIENT_CLINIC_OR_DEPARTMENT_OTHER): Payer: 59 | Admitting: Internal Medicine

## 2011-03-02 ENCOUNTER — Other Ambulatory Visit (HOSPITAL_BASED_OUTPATIENT_CLINIC_OR_DEPARTMENT_OTHER): Payer: 59 | Admitting: Lab

## 2011-03-02 VITALS — BP 102/66 | HR 77 | Temp 97.3°F | Wt 198.3 lb

## 2011-03-02 DIAGNOSIS — C9000 Multiple myeloma not having achieved remission: Secondary | ICD-10-CM

## 2011-03-02 DIAGNOSIS — Z5111 Encounter for antineoplastic chemotherapy: Secondary | ICD-10-CM

## 2011-03-02 DIAGNOSIS — Z5112 Encounter for antineoplastic immunotherapy: Secondary | ICD-10-CM

## 2011-03-02 LAB — CBC WITH DIFFERENTIAL/PLATELET
Basophils Absolute: 0 10*3/uL (ref 0.0–0.1)
Eosinophils Absolute: 0.1 10*3/uL (ref 0.0–0.5)
HCT: 40.5 % (ref 38.4–49.9)
HGB: 13.7 g/dL (ref 13.0–17.1)
MCH: 32.1 pg (ref 27.2–33.4)
MONO#: 0 10*3/uL — ABNORMAL LOW (ref 0.1–0.9)
NEUT#: 1.6 10*3/uL (ref 1.5–6.5)
NEUT%: 88.1 % — ABNORMAL HIGH (ref 39.0–75.0)
WBC: 1.8 10*3/uL — ABNORMAL LOW (ref 4.0–10.3)
lymph#: 0.1 10*3/uL — ABNORMAL LOW (ref 0.9–3.3)

## 2011-03-02 NOTE — Progress Notes (Signed)
Has double lumen hickman catheter  placed 02/25/11 at [facility]  - dressing and catheter  flushing maintenance done per wife or daughter weekly . Last dressing change 02/28/11. Caps on both lumens.

## 2011-03-03 LAB — COMPREHENSIVE METABOLIC PANEL
Albumin: 4.2 g/dL (ref 3.5–5.2)
BUN: 35 mg/dL — ABNORMAL HIGH (ref 6–23)
CO2: 27 mEq/L (ref 19–32)
Calcium: 9.7 mg/dL (ref 8.4–10.5)
Chloride: 98 mEq/L (ref 96–112)
Creatinine, Ser: 1.9 mg/dL — ABNORMAL HIGH (ref 0.50–1.35)
Potassium: 4.7 mEq/L (ref 3.5–5.3)

## 2011-03-03 LAB — IGG, IGA, IGM
IgA: 208 mg/dL (ref 68–379)
IgM, Serum: 43 mg/dL (ref 41–251)

## 2011-03-03 LAB — KAPPA/LAMBDA LIGHT CHAINS: Kappa:Lambda Ratio: 0.4 (ref 0.26–1.65)

## 2011-03-03 LAB — LACTATE DEHYDROGENASE: LDH: 202 U/L (ref 94–250)

## 2011-03-03 NOTE — Progress Notes (Signed)
[facility] OFFICE PROGRESS NOTE  DIAGNOSIS:  Multiple myeloma IgA subtype diagnosed in November, 2009.   PRIOR THERAPY:  1. Status post seven cycles of systemic chemotherapy with Revlimid and low-dose Betatron with partial response. The last dose was given June, 2010. 2. Status post chemotherapy with weekly Velcade in a patient on Decadron 40 mg on a weekly basis, status post 75 weekly doses. The last dose was given May 04, 2008. 3. Status post treatment again with Velcade and Decadron. The patient was awaiting stem cell transplant at [facility]. Last dose was given Jul 16, 2009. 4. The patient was not considered a good candidate for autologous stem cell transplant at that time secondary to poor mobilization of his stem cells.  5. Status post six cycles of systemic chemotherapy with Velcade, Cytoxan and Betatron. Last dose was given March 25, 2010. This continued secondary to his disease progression. 6. Status post ten cycles of systemic chemotherapy with Velcade. Doxil and Decadron. Last dose was given on 10/31/2010. Currently on hold because of concern about cardiac toxicity.  CURRENT THERAPY: None but waiting autologous peripheral blood stem cell transplant.   INTERVAL HISTORY: Rodney Green 66 y.o. male returns to the clinic today for followup visit. The patient received a mobilization chemotherapy with Cytoxan at [facility] between January 2nd  - 4th, 2013 for his expected autologous peripheral blood stem cell transplant. He is feeling fine and he denied having any significant complaints. He has no fever or chills. No nausea or vomiting, no chest pain or shortness of breath. I was asked by Dr. Hurman Horn to check his labs 3 times a week including CBC and comprehensive metabolic panel during the mobilization period. The patient is here today for blood work.  MEDICAL HISTORY: Past Medical History  Diagnosis Date  . Allergy   .  Diabetes mellitus   . Hypertension   . Hepatitis C   . Multiple myeloma     ALLERGIES:  is allergic to ace inhibitors.  MEDICATIONS:  Current Outpatient Prescriptions  Medication Sig Dispense Refill  . amitriptyline (ELAVIL) 50 MG tablet TAKE 1 TABLET BY MOUTH AT BEDTIME  30 tablet  3  . amLODipine (NORVASC) 5 MG tablet Take 5 mg by mouth daily.        . ARIPiprazole (ABILIFY) 5 MG tablet Take 5 mg by mouth 1 day or 1 dose.        Marland Kitchen atenolol (TENORMIN) 100 MG tablet Take 100 mg by mouth daily.        Marland Kitchen dexamethasone (DECADRON) 4 MG tablet Take 40 mg by mouth once a week.        . docusate sodium (COLACE) 100 MG capsule Take 100 mg by mouth 2 (two) times daily. take 2 bid       . gabapentin (NEURONTIN) 100 MG capsule Take 100 mg by mouth 3 (three) times daily.        . insulin glargine (LANTUS) 100 UNIT/ML injection Inject 40 Units into the skin 1 day or 1 dose.        Marland Kitchen oxycodone (OXY-IR) 5 MG capsule Take 5 mg by mouth every 6 (six) hours as needed.        Marland Kitchen oxyCODONE (OXYCONTIN) 40 MG 12 hr tablet Take 1 tablet (40 mg total) by mouth every 12 (twelve) hours.  60 tablet  0  . sildenafil (VIAGRA) 25 MG tablet Take 25 mg by mouth daily as needed.        Marland Kitchen  simvastatin (ZOCOR) 10 MG tablet Take 10 mg by mouth at bedtime.        . valsartan-hydrochlorothiazide (DIOVAN-HCT) 160-12.5 MG per tablet Take 1 tablet by mouth daily.          REVIEW OF SYSTEMS:  A comprehensive review of systems was negative.   PHYSICAL EXAMINATION: General appearance: alert, cooperative and no distress Head: Normocephalic, without obvious abnormality, atraumatic Neck: no adenopathy Lymph nodes: Cervical, supraclavicular, and axillary nodes normal. Resp: clear to auscultation bilaterally Cardio: regular rate and rhythm, S1, S2 normal, no murmur, click, rub or gallop GI: soft, non-tender; bowel sounds normal; no masses,  no organomegaly Extremities: extremities normal, atraumatic, no cyanosis or  edema Neurologic: Alert and oriented X 3, normal strength and tone. Normal symmetric reflexes. Normal coordination and gait  ECOG PERFORMANCE STATUS: 0 - Asymptomatic  Blood pressure 102/66, pulse 77, temperature 97.3 F (36.3 C), temperature source Oral, weight 198 lb 4.8 oz (89.948 kg).  LABORATORY DATA: Lab Results  Component Value Date   WBC 1.8* 03/02/2011   HGB 13.7 03/02/2011   HCT 40.5 03/02/2011   MCV 94.7 03/02/2011   PLT 150 03/02/2011      Chemistry      Component Value Date/Time   NA 137 03/02/2011 1132   NA 137 03/02/2011 1132   K 4.7 03/02/2011 1132   K 4.7 03/02/2011 1132   CL 98 03/02/2011 1132   CL 98 03/02/2011 1132   CO2 27 03/02/2011 1132   CO2 27 03/02/2011 1132   BUN 35* 03/02/2011 1132   BUN 35* 03/02/2011 1132   CREATININE 1.90* 03/02/2011 1132   CREATININE 1.90* 03/02/2011 1132   GLU 293* 08/12/2009 1322      Component Value Date/Time   CALCIUM 9.7 03/02/2011 1132   CALCIUM 9.7 03/02/2011 1132   ALKPHOS 127* 03/02/2011 1132   ALKPHOS 127* 03/02/2011 1132   AST 20 03/02/2011 1132   AST 20 03/02/2011 1132   ALT 15 03/02/2011 1132   ALT 15 03/02/2011 1132   BILITOT 0.6 03/02/2011 1132   BILITOT 0.6 03/02/2011 1132       RADIOGRAPHIC STUDIES: No results found.  ASSESSMENT: This is a very pleasant 66 years old African American male with IgA multiple myeloma, status post several regimen of chemotherapy. He is currently undergoing chemotherapy mobilization in preparation for the autologous peripheral blood stem cell transplant. The patient is doing fine today  PLAN: I will continue to check his CBC and comprehensive metabolic panel 3 times a week and fax the results to [facility]. The patient also continue on Neupogen 960 mcg subcutaneously on daily basis until recovery of his neutrophil count as prescribed by his transplant physician at [facility]. I would see the patient after completion of his stem cell transplant. He was advised to call me immediately if  he has any concerning symptoms in the interval.   All questions were answered. The patient knows to call the clinic with any problems, questions or concerns. We can certainly see the patient much sooner if necessary.

## 2011-03-04 ENCOUNTER — Other Ambulatory Visit (HOSPITAL_BASED_OUTPATIENT_CLINIC_OR_DEPARTMENT_OTHER): Payer: 59 | Admitting: Lab

## 2011-03-04 DIAGNOSIS — C9000 Multiple myeloma not having achieved remission: Secondary | ICD-10-CM

## 2011-03-04 LAB — COMPREHENSIVE METABOLIC PANEL
AST: 21 U/L (ref 0–37)
Albumin: 4.3 g/dL (ref 3.5–5.2)
BUN: 51 mg/dL — ABNORMAL HIGH (ref 6–23)
Calcium: 8.9 mg/dL (ref 8.4–10.5)
Chloride: 102 mEq/L (ref 96–112)
Creatinine, Ser: 2.01 mg/dL — ABNORMAL HIGH (ref 0.50–1.35)
Glucose, Bld: 128 mg/dL — ABNORMAL HIGH (ref 70–99)
Potassium: 4.4 mEq/L (ref 3.5–5.3)

## 2011-03-04 LAB — CBC WITH DIFFERENTIAL/PLATELET
Basophils Absolute: 0 10*3/uL (ref 0.0–0.1)
EOS%: 0.6 % (ref 0.0–7.0)
Eosinophils Absolute: 0 10*3/uL (ref 0.0–0.5)
HCT: 36 % — ABNORMAL LOW (ref 38.4–49.9)
HGB: 12.4 g/dL — ABNORMAL LOW (ref 13.0–17.1)
MCH: 32.5 pg (ref 27.2–33.4)
MCV: 94.8 fL (ref 79.3–98.0)
NEUT#: 1.7 10*3/uL (ref 1.5–6.5)
NEUT%: 95.5 % — ABNORMAL HIGH (ref 39.0–75.0)
RDW: 13.7 % (ref 11.0–14.6)
lymph#: 0 10*3/uL — ABNORMAL LOW (ref 0.9–3.3)

## 2011-03-05 ENCOUNTER — Other Ambulatory Visit: Payer: Self-pay | Admitting: Internal Medicine

## 2011-03-05 MED ORDER — OXYCODONE HCL 40 MG PO TB12: 40.0000 mg | ORAL_TABLET | Freq: Two times a day (BID) | ORAL | Status: DC

## 2011-03-05 NOTE — Telephone Encounter (Signed)
refill requested - written and taken to front for pickup in am-

## 2011-03-06 ENCOUNTER — Ambulatory Visit: Payer: 59

## 2011-03-06 ENCOUNTER — Other Ambulatory Visit: Payer: Self-pay | Admitting: Internal Medicine

## 2011-03-06 ENCOUNTER — Other Ambulatory Visit (HOSPITAL_BASED_OUTPATIENT_CLINIC_OR_DEPARTMENT_OTHER): Payer: 59 | Admitting: Lab

## 2011-03-06 DIAGNOSIS — C9000 Multiple myeloma not having achieved remission: Secondary | ICD-10-CM

## 2011-03-06 LAB — CBC WITH DIFFERENTIAL/PLATELET
Basophils Absolute: 0 10*3/uL (ref 0.0–0.1)
EOS%: 3.7 % (ref 0.0–7.0)
Eosinophils Absolute: 0 10*3/uL (ref 0.0–0.5)
HCT: 34.3 % — ABNORMAL LOW (ref 38.4–49.9)
HGB: 11.7 g/dL — ABNORMAL LOW (ref 13.0–17.1)
MONO#: 0 10*3/uL — ABNORMAL LOW (ref 0.1–0.9)
NEUT#: 0.1 10*3/uL — CL (ref 1.5–6.5)
NEUT%: 44.5 % (ref 39.0–75.0)
RDW: 12.4 % (ref 11.0–14.6)
WBC: 0.3 10*3/uL — CL (ref 4.0–10.3)
lymph#: 0.1 10*3/uL — ABNORMAL LOW (ref 0.9–3.3)

## 2011-03-06 LAB — COMPREHENSIVE METABOLIC PANEL
Albumin: 4.1 g/dL (ref 3.5–5.2)
Alkaline Phosphatase: 122 U/L — ABNORMAL HIGH (ref 39–117)
BUN: 32 mg/dL — ABNORMAL HIGH (ref 6–23)
CO2: 28 mEq/L (ref 19–32)
Calcium: 9 mg/dL (ref 8.4–10.5)
Chloride: 107 mEq/L (ref 96–112)
Glucose, Bld: 170 mg/dL — ABNORMAL HIGH (ref 70–99)
Potassium: 4 mEq/L (ref 3.5–5.3)
Sodium: 141 mEq/L (ref 135–145)
Total Protein: 6.5 g/dL (ref 6.0–8.3)

## 2011-03-06 NOTE — Telephone Encounter (Signed)
Labs faxed to [facility]-pt  is on Cipro and neupogen now per [facility] post priming chemotherapy orders . Instructed pt on neutropenic precautions and gave him some masks. He understands to call for temp greater 100.5 or chills. Pt voices understanding. He has a f/u lab appointment here on Monday unless he goes to [facility].

## 2011-03-06 NOTE — Progress Notes (Signed)
Quick Note:  Call patient with the result. This expected after chemo mobilization for the stem cell transplant. He needs to continue taking his neupogen daily and call if he has any fever. Please give neutropenic precautions. ______

## 2011-03-09 ENCOUNTER — Other Ambulatory Visit: Payer: 59 | Admitting: Lab

## 2011-03-09 ENCOUNTER — Encounter: Payer: Self-pay | Admitting: *Deleted

## 2011-03-09 NOTE — Progress Notes (Signed)
Pt FTKA for lab appt today 03/09/11.  Called and left msg on pt's cell phone to check pt status and to call us back.  SLJ

## 2011-03-10 ENCOUNTER — Telehealth: Payer: Self-pay | Admitting: Internal Medicine

## 2011-03-10 NOTE — Telephone Encounter (Signed)
On Friday Mar 06, 2011 pt was instructed by me on neutrapenic precautions and was taking neupogen self injections and cipro.   I called wife today and she reported that pt went to [facility] yesterday - wife said wbc was 2.3 and was told that  Cd 34 was too low so he is taking today off , is to continue with neupogen and cipro until he is told to stop by [facility] provider . HEe is going back to  [facility] in am so he will not be here for labs on wed1/16 and she  Probably not  Friday 1/18.

## 2011-03-11 ENCOUNTER — Other Ambulatory Visit: Payer: 59

## 2011-03-13 ENCOUNTER — Other Ambulatory Visit: Payer: 59 | Admitting: Lab

## 2011-03-13 ENCOUNTER — Other Ambulatory Visit: Payer: Self-pay | Admitting: *Deleted

## 2011-03-13 ENCOUNTER — Telehealth: Payer: Self-pay | Admitting: Internal Medicine

## 2011-03-13 ENCOUNTER — Telehealth: Payer: Self-pay | Admitting: *Deleted

## 2011-03-13 MED ORDER — FILGRASTIM 480 MCG/0.8ML IJ SOLN: 960.0000 ug | Freq: Once | INTRAMUSCULAR | Status: DC

## 2011-03-13 NOTE — Progress Notes (Deleted)
Rodney Green from [facility] called requesting that pt's 3x weekly labs be cancelled and that pt have a neupogen injection at [facility] on Monday.  Onc tx schedule filled out.  Msg sent to Dr Donnald Garre regarding plan of care.  SLJ

## 2011-03-13 NOTE — Telephone Encounter (Signed)
Erroneous

## 2011-03-13 NOTE — Telephone Encounter (Signed)
called pt to cx all labs for next week but to add inj on 1/21,pt aware and sch for 9:15    aom    per [facility] per slj

## 2011-03-13 NOTE — Progress Notes (Signed)
Amanda Yopp from [facility] called requesting that pt's 3x weekly labs be cancelled and that pt have a neupogen 960mcg injection at [facility] on Monday.  Onc tx schedule filled out.  Msg sent to Dr MKM regarding plan of care.  SLJ 

## 2011-03-16 ENCOUNTER — Other Ambulatory Visit: Payer: Self-pay | Admitting: Internal Medicine

## 2011-03-16 ENCOUNTER — Other Ambulatory Visit: Payer: 59 | Admitting: Lab

## 2011-03-16 ENCOUNTER — Telehealth: Payer: Self-pay | Admitting: Internal Medicine

## 2011-03-16 ENCOUNTER — Ambulatory Visit: Payer: 59

## 2011-03-16 MED ORDER — FILGRASTIM 480 MCG/0.8ML IJ SOLN: 960.0000 ug | Freq: Once | INTRAMUSCULAR | Status: DC

## 2011-03-16 NOTE — Telephone Encounter (Signed)
l/m on h and cell # to call for lab appts starting today   aom

## 2011-03-16 NOTE — Telephone Encounter (Signed)
Left Message with Arlyn Leak RN triage that we cannot give pt Neupogen per their fax orders. Pt notified.

## 2011-03-17 ENCOUNTER — Telehealth: Payer: Self-pay | Admitting: Internal Medicine

## 2011-03-17 NOTE — Telephone Encounter (Signed)
Pt went to [facility] yesterday and CD count too low . He is going back next Thursday . Hope said he does not need any further labs here.

## 2011-03-18 ENCOUNTER — Telehealth: Payer: Self-pay | Admitting: Internal Medicine

## 2011-03-18 ENCOUNTER — Other Ambulatory Visit: Payer: 59 | Admitting: Lab

## 2011-03-18 NOTE — Telephone Encounter (Signed)
Pof from 1/21 states pt needs q mwf cbc.  Per pt Dr at [facility] states no labs needed at this time.  Email sent to mkm on 1/22 and he advised ok not to sch.  Pt aware 1/23   aom

## 2011-03-20 ENCOUNTER — Other Ambulatory Visit: Payer: 59 | Admitting: Lab

## 2011-03-23 ENCOUNTER — Other Ambulatory Visit: Payer: Self-pay | Admitting: Orthopedic Surgery

## 2011-03-23 DIAGNOSIS — M25512 Pain in left shoulder: Secondary | ICD-10-CM

## 2011-03-28 ENCOUNTER — Ambulatory Visit
Admission: RE | Admit: 2011-03-28 | Discharge: 2011-03-28 | Disposition: A | Discharge status: Home / Self Care | Payer: 59 | Source: Ambulatory Visit | Attending: Orthopedic Surgery | Admitting: Orthopedic Surgery

## 2011-03-28 DIAGNOSIS — M25512 Pain in left shoulder: Secondary | ICD-10-CM

## 2011-04-07 ENCOUNTER — Encounter: Payer: Self-pay | Admitting: *Deleted

## 2011-04-07 ENCOUNTER — Other Ambulatory Visit: Payer: Self-pay | Admitting: *Deleted

## 2011-04-07 MED ORDER — OXYCODONE HCL 40 MG PO TB12: 40.0000 mg | ORAL_TABLET | Freq: Two times a day (BID) | ORAL | Status: DC

## 2011-04-07 NOTE — Progress Notes (Signed)
Pt's wife called stating that Electa Sniff from [facility] is requesting that pt have 24hr urine done and they have the written order that Electa Sniff wrote.  Informed Hope that pt can complete 24hr urine and bring it to [facility] once sample is completed.  Also asked Hope to let us know if she needs written instructions on how to do a 24h collection.  Left msg on voicemail.  SLJ

## 2011-05-06 ENCOUNTER — Other Ambulatory Visit: Payer: Self-pay | Admitting: Medical Oncology

## 2011-05-06 MED ORDER — OXYCODONE HCL 40 MG PO TB12: 40.0000 mg | ORAL_TABLET | Freq: Two times a day (BID) | ORAL | Status: DC

## 2011-05-06 NOTE — Telephone Encounter (Signed)
Refill requested . Sent to injection room for pick up in am

## 2011-06-04 ENCOUNTER — Other Ambulatory Visit: Payer: Self-pay | Admitting: Medical Oncology

## 2011-06-04 MED ORDER — OXYCODONE HCL 40 MG PO TB12: 40.0000 mg | ORAL_TABLET | Freq: Two times a day (BID) | ORAL | Status: DC

## 2011-06-05 NOTE — Telephone Encounter (Signed)
Left message to pick up rx 

## 2011-06-15 ENCOUNTER — Other Ambulatory Visit: Payer: Self-pay | Admitting: Medical Oncology

## 2011-06-18 ENCOUNTER — Telehealth: Payer: Self-pay | Admitting: Internal Medicine

## 2011-06-18 NOTE — Telephone Encounter (Signed)
S/w pt re appt for 4/30.  °

## 2011-06-23 ENCOUNTER — Ambulatory Visit (HOSPITAL_BASED_OUTPATIENT_CLINIC_OR_DEPARTMENT_OTHER): Payer: 59 | Admitting: Internal Medicine

## 2011-06-23 ENCOUNTER — Other Ambulatory Visit (HOSPITAL_BASED_OUTPATIENT_CLINIC_OR_DEPARTMENT_OTHER): Payer: 59 | Admitting: Lab

## 2011-06-23 ENCOUNTER — Telehealth: Payer: Self-pay | Admitting: Internal Medicine

## 2011-06-23 ENCOUNTER — Other Ambulatory Visit: Payer: Self-pay | Admitting: *Deleted

## 2011-06-23 ENCOUNTER — Encounter: Payer: Self-pay | Admitting: *Deleted

## 2011-06-23 DIAGNOSIS — C9000 Multiple myeloma not having achieved remission: Secondary | ICD-10-CM

## 2011-06-23 DIAGNOSIS — C9002 Multiple myeloma in relapse: Secondary | ICD-10-CM | POA: Insufficient documentation

## 2011-06-23 LAB — CBC WITH DIFFERENTIAL/PLATELET
BASO%: 0.7 % (ref 0.0–2.0)
EOS%: 2.1 % (ref 0.0–7.0)
HGB: 13.2 g/dL (ref 13.0–17.1)
MCH: 31.7 pg (ref 27.2–33.4)
MCHC: 34 g/dL (ref 32.0–36.0)
RDW: 13.1 % (ref 11.0–14.6)
lymph#: 0.7 10*3/uL — ABNORMAL LOW (ref 0.9–3.3)

## 2011-06-23 LAB — COMPREHENSIVE METABOLIC PANEL
ALT: 13 U/L (ref 0–53)
AST: 23 U/L (ref 0–37)
Albumin: 4.2 g/dL (ref 3.5–5.2)
Calcium: 9.6 mg/dL (ref 8.4–10.5)
Chloride: 108 mEq/L (ref 96–112)
Potassium: 4.2 mEq/L (ref 3.5–5.3)
Sodium: 144 mEq/L (ref 135–145)

## 2011-06-23 MED ORDER — POMALIDOMIDE 4 MG PO CAPS: 4.0000 mg | ORAL_CAPSULE | Freq: Every day | ORAL | Status: DC

## 2011-06-23 NOTE — Progress Notes (Signed)
Pomalyst Rems program forms reviewed and filled out, signed by [facility] and patient and faxed to [phone].  SLJ

## 2011-06-23 NOTE — Telephone Encounter (Signed)
appts made and printed for pt aom °

## 2011-06-23 NOTE — Telephone Encounter (Signed)
Pomalyst Rx faxed to [facility], Auth # [id number], survey completed by patient and prescriber on 06/23/11.  SLJ

## 2011-06-23 NOTE — Progress Notes (Signed)
[facility] [phone]   [phone]  OFFICE PROGRESS NOTE   DIAGNOSIS:  Multiple myeloma IgA subtype diagnosed in November, 2009.   PRIOR THERAPY:  1. Status post seven cycles of systemic chemotherapy with Revlimid and low-dose Betatron with partial response. The last dose was given June, 2010. 2. Status post chemotherapy with weekly Velcade in a patient on Decadron 40 mg on a weekly basis, status post 75 weekly doses. The last dose was given May 04, 2008. 3. Status post treatment again with Velcade and Decadron. The patient was awaiting stem cell transplant at [facility]. Last dose was given Jul 16, 2009. 4. The patient was not considered a good candidate for autologous stem cell transplant at that time secondary to poor mobilization of his stem cells.  5. Status post six cycles of systemic chemotherapy with Velcade, Cytoxan and Betatron. Last dose was given March 25, 2010. This continued secondary to his disease progression. 6. Status post ten cycles of systemic chemotherapy with Velcade. Doxil and Decadron. Last dose was given on 10/31/2010. Currently on hold because of concern about cardiac toxicity.  CURRENT THERAPY: None.  INTERVAL HISTORY: Rodney Green 66 y.o. male returns to the clinic today for followup visit to come by his wife. The patient was last seen in January of 2013. He was referred to Dr. Barbaraann Boys at [facility] for consideration of autologous peripheral blood stem cell transplant but unfortunately is he was unable to collect a sufficient amount of stem cells to be able to proceed with the transplant. She recommended for him to come back to [facility] for evaluation and consideration of treatment with Pomalyst or carfilzomib. The patient is feeling fine today with no specific complaints. He denied having any significant weight loss or night sweats. He has no chest pain or shortness of  breath. No aching pain.   MEDICAL HISTORY: Past Medical History  Diagnosis Date  . Allergy   . Diabetes mellitus   . Hypertension   . Hepatitis C   . Multiple myeloma     ALLERGIES:  is allergic to ace inhibitors.  MEDICATIONS:  Current Outpatient Prescriptions  Medication Sig Dispense Refill  . amLODipine (NORVASC) 5 MG tablet Take 5 mg by mouth daily.        Marland Kitchen aspirin 81 MG tablet Take 81 mg by mouth daily.      Marland Kitchen atenolol (TENORMIN) 100 MG tablet Take 100 mg by mouth daily.        Marland Kitchen docusate sodium (COLACE) 100 MG capsule Take 100 mg by mouth 2 (two) times daily. take 2 bid       . insulin glargine (LANTUS) 100 UNIT/ML injection Inject 64 Units into the skin 1 day or 1 dose.       Marland Kitchen oxycodone (OXY-IR) 5 MG capsule Take 5 mg by mouth every 6 (six) hours as needed.        Marland Kitchen oxyCODONE (OXYCONTIN) 40 MG 12 hr tablet Take 1 tablet (40 mg total) by mouth every 12 (twelve) hours.  60 tablet  0  . sildenafil (VIAGRA) 25 MG tablet Take 25 mg by mouth daily as needed.        . simvastatin (ZOCOR) 10 MG tablet Take 10 mg by mouth at bedtime.        . valsartan-hydrochlorothiazide (DIOVAN-HCT) 160-12.5 MG per tablet Take 1 tablet by mouth daily.        Marland Kitchen amitriptyline (ELAVIL) 50  MG tablet TAKE 1 TABLET BY MOUTH AT BEDTIME  30 tablet  3  . ARIPiprazole (ABILIFY) 5 MG tablet Take 5 mg by mouth 1 day or 1 dose.        Marland Kitchen dexamethasone (DECADRON) 4 MG tablet Take 40 mg by mouth once a week.        . filgrastim (NEUPOGEN) 480 MCG/0.8ML SOLN injection Inject 960 mcg into the skin daily. Pt doing self injections and is to continue until he returns to [facility] when WBC > 3000      . gabapentin (NEURONTIN) 100 MG capsule Take 100 mg by mouth 3 (three) times daily.        . Pomalidomide (POMALYST) 4 MG CAPS Take 4 mg by mouth daily.  21 capsule  0   REVIEW OF SYSTEMS:  A comprehensive review of systems was negative.   PHYSICAL EXAMINATION: General appearance: alert, cooperative and no distress Head:  Normocephalic, without obvious abnormality, atraumatic Neck: no adenopathy Lymph nodes: Cervical, supraclavicular, and axillary nodes normal. Resp: clear to auscultation bilaterally Cardio: regular rate and rhythm, S1, S2 normal, no murmur, click, rub or gallop GI: soft, non-tender; bowel sounds normal; no masses,  no organomegaly Extremities: extremities normal, atraumatic, no cyanosis or edema Neurologic: Alert and oriented X 3, normal strength and tone. Normal symmetric reflexes. Normal coordination and gait  ECOG PERFORMANCE STATUS: 1 - Symptomatic but completely ambulatory  Blood pressure 143/85, pulse 64, temperature 97.4 F (36.3 C), temperature source Oral, height 5\' 6"  (1.676 m), weight 203 lb 1.6 oz (92.126 kg).  LABORATORY DATA: Lab Results  Component Value Date   WBC 2.9* 06/23/2011   HGB 13.2 06/23/2011   HCT 38.8 06/23/2011   MCV 93.3 06/23/2011   PLT 163 06/23/2011      Chemistry      Component Value Date/Time   NA 144 06/23/2011 1135   K 4.2 06/23/2011 1135   CL 108 06/23/2011 1135   CO2 27 06/23/2011 1135   BUN 22 06/23/2011 1135   CREATININE 1.37* 06/23/2011 1135   GLU 293* 08/12/2009 1322      Component Value Date/Time   CALCIUM 9.6 06/23/2011 1135   ALKPHOS 96 06/23/2011 1135   AST 23 06/23/2011 1135   ALT 13 06/23/2011 1135   BILITOT 0.5 06/23/2011 1135       RADIOGRAPHIC STUDIES: No results found.  ASSESSMENT: This is a very pleasant 66 years old [place] American male with multiple myeloma. The patient was considered for autologous peripheral blood stem cell transplant but unfortunately they were unable to collect a sufficient [place] for the procedure. He is here today for evaluation and resuming systemic therapy.  PLAN: I have a lengthy discussion with the patient and his wife today about his treatment options. I recommended for him treatment with Pomalyst 4 mg by mouth daily for 21 days every 28 days in addition to Decadron 40 mg on a weekly basis. I  discussed with the patient adverse effect of this treatment including birth defects as well as a high risk of deep venous thrombosis, in addition to the other adverse effect including myelosuppression, alopecia, nausea and vomiting, liver or renal dysfunction.  The patient would like to proceed with treatment as planned and he signed consent to receive the medication. He would come back for followup visit in 3 weeks for evaluation and management any adverse effect of his therapy.  All questions were answered. The patient knows to call the clinic with any problems, questions or concerns.  We can certainly see the patient much sooner if necessary.  I spent 20 minutes counseling the patient face to face. The total time spent in the appointment was 35 minutes.

## 2011-07-03 ENCOUNTER — Telehealth: Payer: Self-pay | Admitting: Medical Oncology

## 2011-07-03 ENCOUNTER — Other Ambulatory Visit: Payer: Self-pay | Admitting: Medical Oncology

## 2011-07-03 MED ORDER — DEXAMETHASONE 4 MG PO TABS: 40.0000 mg | ORAL_TABLET | ORAL | Status: DC

## 2011-07-03 NOTE — Telephone Encounter (Signed)
Per Dr Donnald Garre I l told  pt to take Benadryl 25 mg po TID. HE voices understanding.

## 2011-07-03 NOTE — Telephone Encounter (Addendum)
Rodney Green started [employer] on 4/30 ./ He is having intense itching- no rash or hives-getting progressively worse each day. I called pt and he reports the itching is on his head and at belt line x 3 days. He says rubbing alcohol swabs on his head helps. Dr Donnald Garre notified.I instructed pt to call [facility] too.

## 2011-07-06 ENCOUNTER — Other Ambulatory Visit: Payer: Self-pay | Admitting: *Deleted

## 2011-07-06 MED ORDER — OXYCODONE HCL 40 MG PO TB12: 40.0000 mg | ORAL_TABLET | Freq: Two times a day (BID) | ORAL | Status: DC

## 2011-07-14 ENCOUNTER — Telehealth: Payer: Self-pay | Admitting: Internal Medicine

## 2011-07-14 ENCOUNTER — Encounter: Payer: Self-pay | Admitting: Physician Assistant

## 2011-07-14 ENCOUNTER — Ambulatory Visit (HOSPITAL_BASED_OUTPATIENT_CLINIC_OR_DEPARTMENT_OTHER): Payer: 59 | Admitting: Physician Assistant

## 2011-07-14 ENCOUNTER — Other Ambulatory Visit (HOSPITAL_BASED_OUTPATIENT_CLINIC_OR_DEPARTMENT_OTHER): Payer: 59 | Admitting: Lab

## 2011-07-14 VITALS — BP 178/95 | HR 59 | Temp 96.7°F | Ht 66.0 in | Wt 197.0 lb

## 2011-07-14 DIAGNOSIS — C9000 Multiple myeloma not having achieved remission: Secondary | ICD-10-CM

## 2011-07-14 DIAGNOSIS — Z5189 Encounter for other specified aftercare: Secondary | ICD-10-CM

## 2011-07-14 DIAGNOSIS — C9002 Multiple myeloma in relapse: Secondary | ICD-10-CM

## 2011-07-14 LAB — COMPREHENSIVE METABOLIC PANEL
ALT: 15 U/L (ref 0–53)
Albumin: 4 g/dL (ref 3.5–5.2)
Alkaline Phosphatase: 127 U/L — ABNORMAL HIGH (ref 39–117)
CO2: 24 mEq/L (ref 19–32)
Potassium: 4.4 mEq/L (ref 3.5–5.3)
Sodium: 134 mEq/L — ABNORMAL LOW (ref 135–145)
Total Bilirubin: 0.4 mg/dL (ref 0.3–1.2)
Total Protein: 7.3 g/dL (ref 6.0–8.3)

## 2011-07-14 LAB — CBC WITH DIFFERENTIAL/PLATELET
Basophils Absolute: 0 10*3/uL (ref 0.0–0.1)
EOS%: 0.1 % (ref 0.0–7.0)
Eosinophils Absolute: 0 10*3/uL (ref 0.0–0.5)
HGB: 14.1 g/dL (ref 13.0–17.1)
MCV: 95.4 fL (ref 79.3–98.0)
MONO%: 4.9 % (ref 0.0–14.0)
NEUT#: 1.3 10*3/uL — ABNORMAL LOW (ref 1.5–6.5)
RBC: 4.41 10*6/uL (ref 4.20–5.82)
RDW: 13.6 % (ref 11.0–14.6)
WBC: 1.7 10*3/uL — ABNORMAL LOW (ref 4.0–10.3)
lymph#: 0.3 10*3/uL — ABNORMAL LOW (ref 0.9–3.3)
nRBC: 0 % (ref 0–0)

## 2011-07-14 NOTE — Telephone Encounter (Signed)
appts made and printed for pt aom °

## 2011-07-17 ENCOUNTER — Encounter: Payer: Self-pay | Admitting: Physician Assistant

## 2011-07-17 NOTE — Progress Notes (Signed)
[facility] Telephone:(336) [phone]   Fax:(336) [phone]  OFFICE PROGRESS NOTE   DIAGNOSIS:  Multiple myeloma IgA subtype diagnosed in November, 2009.   PRIOR THERAPY:  1. Status post seven cycles of systemic chemotherapy with Revlimid and low-dose Betatron with partial response. The last dose was given June, 2010. 2. Status post chemotherapy with weekly Velcade in a patient on Decadron 40 mg on a weekly basis, status post 75 weekly doses. The last dose was given May 04, 2008. 3. Status post treatment again with Velcade and Decadron. The patient was awaiting stem cell transplant at [facility]. Last dose was given Jul 16, 2009. 4. The patient was not considered a good candidate for autologous stem cell transplant at that time secondary to poor mobilization of his stem cells.  5. Status post six cycles of systemic chemotherapy with Velcade, Cytoxan and Betatron. Last dose was given March 25, 2010. This continued secondary to his disease progression. 6. Status post ten cycles of systemic chemotherapy with Velcade. Doxil and Decadron. Last dose was given on 10/31/2010. Currently on hold because of concern about cardiac toxicity.  CURRENT THERAPY: 1. Pomalyst 4 mg by mouth daily for 21 days every 28 days 2. Decadron 40 mg by mouth weekly  INTERVAL HISTORY: Rodney Green 66 y.o. male returns to the clinic today for followup visit.  He has been on the Pomalyst for approximately 2 weeks. He has missed one dose.He initially experienced some generalized itching however this was relieved with Benadryl after 2 days. He complains of extremely painful cramping/bone pain. This affects the top of his right foot extending up along the side of the right shin and stops below the knee. He states that sometimes doing some range of motion type maneuvers efforts the pain from going into a full-blown cramp. This pain does not occur daily. He also experienced a couple of  episodes of mild dizziness not associated with frank syncope. He's noted some episodes of sweating and increased salivation as well as increased urination. He's had headache not associated with double vision, nausea or vomiting. He also reports some trouble sleeping. He reports that he has an upcoming appointment for consult to have a colonoscopy procedure.  He denied having any significant weight loss or night sweats. He has no chest pain or shortness of breath.   MEDICAL HISTORY: Past Medical History  Diagnosis Date  . Allergy   . Diabetes mellitus   . Hypertension   . Hepatitis C   . Multiple myeloma     ALLERGIES:  is allergic to ace inhibitors.  MEDICATIONS:  Current Outpatient Prescriptions  Medication Sig Dispense Refill  . amitriptyline (ELAVIL) 50 MG tablet TAKE 1 TABLET BY MOUTH AT BEDTIME  30 tablet  3  . amLODipine (NORVASC) 5 MG tablet Take 5 mg by mouth daily.        . ARIPiprazole (ABILIFY) 5 MG tablet Take 5 mg by mouth 1 day or 1 dose.        Marland Kitchen aspirin 81 MG tablet Take 81 mg by mouth daily.      Marland Kitchen atenolol (TENORMIN) 100 MG tablet Take 100 mg by mouth daily.        Marland Kitchen CIALIS 20 MG tablet       . dexamethasone (DECADRON) 4 MG tablet Take 10 tablets (40 mg total) by mouth once a week.  40 tablet  3  . docusate sodium (COLACE) 100 MG capsule Take 100 mg  by mouth 2 (two) times daily. take 2 bid       . filgrastim (NEUPOGEN) 480 MCG/0.8ML SOLN injection Inject 960 mcg into the skin daily. Pt doing self injections and is to continue until he returns to [employer] when WBC > 3000      . gabapentin (NEURONTIN) 100 MG capsule Take 100 mg by mouth 3 (three) times daily.        . insulin glargine (LANTUS) 100 UNIT/ML injection Inject 64 Units into the skin 1 day or 1 dose.       Marland Kitchen oxycodone (OXY-IR) 5 MG capsule Take 5 mg by mouth every 6 (six) hours as needed.        Marland Kitchen oxyCODONE (OXYCONTIN) 40 MG 12 hr tablet Take 1 tablet (40 mg total) by mouth every 12 (twelve) hours.  60 tablet  0    . Pomalidomide (POMALYST) 4 MG CAPS Take 4 mg by mouth daily.  21 capsule  0  . simvastatin (ZOCOR) 10 MG tablet Take 10 mg by mouth at bedtime.        . valsartan-hydrochlorothiazide (DIOVAN-HCT) 160-12.5 MG per tablet Take 1 tablet by mouth daily.         REVIEW OF SYSTEMS:  Pertinent items are noted in HPI.   PHYSICAL EXAMINATION: General appearance: alert, cooperative and no distress Head: Normocephalic, without obvious abnormality, atraumatic Neck: no adenopathy Lymph nodes: Cervical, supraclavicular, and axillary nodes normal. Resp: clear to auscultation bilaterally Cardio: regular rate and rhythm, S1, S2 normal, no murmur, click, rub or gallop GI: soft, non-tender; bowel sounds normal; no masses,  no organomegaly Extremities: extremities normal, atraumatic, no cyanosis or edema Neurologic: Alert and oriented X 3, normal strength and tone. Normal symmetric reflexes. Normal coordination and gait  ECOG PERFORMANCE STATUS: 1 - Symptomatic but completely ambulatory  Blood pressure 178/95, pulse 59, temperature 96.7 F (35.9 C), temperature source Oral, height 5\' 6"  (1.676 m), weight 197 lb (89.359 kg).  LABORATORY DATA: Lab Results  Component Value Date   WBC 1.7* 07/14/2011   HGB 14.1 07/14/2011   HCT 42.0 07/14/2011   MCV 95.4 07/14/2011   PLT 139* 07/14/2011      Chemistry      Component Value Date/Time   NA 134* 07/14/2011 1029   K 4.4 07/14/2011 1029   CL 100 07/14/2011 1029   CO2 24 07/14/2011 1029   BUN 34* 07/14/2011 1029   CREATININE 1.56* 07/14/2011 1029   GLU 293* 08/12/2009 1322      Component Value Date/Time   CALCIUM 9.3 07/14/2011 1029   ALKPHOS 127* 07/14/2011 1029   AST 11 07/14/2011 1029   ALT 15 07/14/2011 1029   BILITOT 0.4 07/14/2011 1029       RADIOGRAPHIC STUDIES: No results found.  ASSESSMENT/PLAN: This is a very pleasant 66 years old [place] American male with multiple myeloma. The patient was considered for autologous peripheral blood stem cell  transplant but unfortunately they were unable to collect a sufficient stem cells for the procedure. The patient was discussed with Dr. Arbutus Ped. He'll continue on the Pomalyst at 4 mg daily for 21 days every 28 days, and Decadron 40 mg by mouth on weekly basis. He'll return in 2 weeks for another symptom management visit with a repeat CBC differential C. met and LDH. He may proceed with colonoscopy as scheduled. We will plan to do protein studies to reevaluate his disease stage after he has completed 2 cycles with the Pomalyst.  Laural Benes, Lettie Czarnecki E, PA-C  All questions were answered. The patient knows to call the clinic with any problems, questions or concerns. We can certainly see the patient much sooner if necessary.  I spent 20 minutes counseling the patient face to face. The total time spent in the appointment was 30 minutes.

## 2011-07-22 ENCOUNTER — Other Ambulatory Visit: Payer: Self-pay | Admitting: *Deleted

## 2011-07-22 MED ORDER — POMALIDOMIDE 4 MG PO CAPS: 4.0000 mg | ORAL_CAPSULE | Freq: Every day | ORAL | Status: DC

## 2011-07-23 NOTE — Telephone Encounter (Signed)
[facility] faxed confirmation of prescription shipment.  Shipped Pomalyst on 07-22-11 with next business day delivery.

## 2011-07-28 ENCOUNTER — Other Ambulatory Visit (HOSPITAL_BASED_OUTPATIENT_CLINIC_OR_DEPARTMENT_OTHER): Payer: 59 | Admitting: Lab

## 2011-07-28 ENCOUNTER — Ambulatory Visit (HOSPITAL_BASED_OUTPATIENT_CLINIC_OR_DEPARTMENT_OTHER): Payer: 59 | Admitting: Physician Assistant

## 2011-07-28 ENCOUNTER — Encounter: Payer: Self-pay | Admitting: Physician Assistant

## 2011-07-28 ENCOUNTER — Telehealth: Payer: Self-pay | Admitting: Internal Medicine

## 2011-07-28 VITALS — BP 157/94 | HR 71 | Temp 97.9°F | Ht 66.0 in | Wt 196.4 lb

## 2011-07-28 DIAGNOSIS — C9000 Multiple myeloma not having achieved remission: Secondary | ICD-10-CM

## 2011-07-28 DIAGNOSIS — C9002 Multiple myeloma in relapse: Secondary | ICD-10-CM

## 2011-07-28 DIAGNOSIS — D709 Neutropenia, unspecified: Secondary | ICD-10-CM

## 2011-07-28 LAB — COMPREHENSIVE METABOLIC PANEL
ALT: 15 U/L (ref 0–53)
AST: 15 U/L (ref 0–37)
Alkaline Phosphatase: 98 U/L (ref 39–117)
Creatinine, Ser: 1.64 mg/dL — ABNORMAL HIGH (ref 0.50–1.35)
Sodium: 141 mEq/L (ref 135–145)
Total Bilirubin: 0.4 mg/dL (ref 0.3–1.2)

## 2011-07-28 LAB — LACTATE DEHYDROGENASE: LDH: 146 U/L (ref 94–250)

## 2011-07-28 LAB — CBC WITH DIFFERENTIAL/PLATELET
BASO%: 1.9 % (ref 0.0–2.0)
EOS%: 0.9 % (ref 0.0–7.0)
LYMPH%: 32.5 % (ref 14.0–49.0)
MCHC: 33.7 g/dL (ref 32.0–36.0)
MCV: 94.6 fL (ref 79.3–98.0)
MONO%: 32 % — ABNORMAL HIGH (ref 0.0–14.0)
Platelets: 199 10*3/uL (ref 140–400)
RBC: 4.18 10*6/uL — ABNORMAL LOW (ref 4.20–5.82)
RDW: 13.5 % (ref 11.0–14.6)
nRBC: 0 % (ref 0–0)

## 2011-07-28 NOTE — Telephone Encounter (Signed)
appts made and printed for pt aom °

## 2011-07-29 ENCOUNTER — Other Ambulatory Visit: Payer: Self-pay | Admitting: Physician Assistant

## 2011-07-29 ENCOUNTER — Encounter: Payer: Self-pay | Admitting: Physician Assistant

## 2011-07-29 NOTE — Progress Notes (Signed)
[facility] at [phone], and gave a prescription for Neupogen 480 mcg subcutaneously for 3 days a total of 3 prefilled syringes with 3 refills. I then phoned Mr. Cortese and left a message on his phone instructing him to call the patient support number at [phone], to call and set up delivery as soon as possible. He is to call our office if he has any questions or difficulties with this prescription. Followup in our office as previously scheduled.  Laural Benes, Treva Huyett E, PA-C

## 2011-07-30 NOTE — Progress Notes (Signed)
[facility] Telephone:([phone]   Fax:(336) [phone]  OFFICE PROGRESS NOTE   DIAGNOSIS:  Multiple myeloma IgA subtype diagnosed in November, 2009.   PRIOR THERAPY:  1. Status post seven cycles of systemic chemotherapy with Revlimid and low-dose Betatron with partial response. The last dose was given June, 2010. 2. Status post chemotherapy with weekly Velcade in a patient on Decadron 40 mg on a weekly basis, status post 75 weekly doses. The last dose was given May 04, 2008. 3. Status post treatment again with Velcade and Decadron. The patient was awaiting stem cell transplant at [facility]. Last dose was given Jul 16, 2009. 4. The patient was not considered a good candidate for autologous stem cell transplant at that time secondary to poor mobilization of his stem cells.  5. Status post six cycles of systemic chemotherapy with Velcade, Cytoxan and Betatron. Last dose was given March 25, 2010. This continued secondary to his disease progression. 6. Status post ten cycles of systemic chemotherapy with Velcade. Doxil and Decadron. Last dose was given on 10/31/2010. Currently on hold because of concern about cardiac toxicity.  CURRENT THERAPY: 1. Pomalyst 4 mg by mouth daily for 21 days every 28 days 2. Decadron 40 mg by mouth weekly  INTERVAL HISTORY: Rodney Green 66 y.o. male returns to the clinic today for followup visit. He has completed 1 cycle of Pomalyst. His main complaint is that of continued intermittent bone pain. The episodes tend to occur in the wee hours of the morning. Other that that he is tolerating it without significant difficulty.  He denied having any significant weight loss or night sweats. He has no chest pain or shortness of breath.   MEDICAL HISTORY: Past Medical History  Diagnosis Date  . Allergy   . Diabetes mellitus   . Hypertension   . Hepatitis C   . Multiple myeloma     ALLERGIES:  is allergic to ace  inhibitors.  MEDICATIONS:  Current Outpatient Prescriptions  Medication Sig Dispense Refill  . amitriptyline (ELAVIL) 50 MG tablet TAKE 1 TABLET BY MOUTH AT BEDTIME  30 tablet  3  . amLODipine (NORVASC) 5 MG tablet Take 5 mg by mouth daily.        . ARIPiprazole (ABILIFY) 5 MG tablet Take 5 mg by mouth 1 day or 1 dose.        Marland Kitchen aspirin 81 MG tablet Take 81 mg by mouth daily.      Marland Kitchen atenolol (TENORMIN) 100 MG tablet Take 100 mg by mouth daily.        Marland Kitchen CIALIS 20 MG tablet       . dexamethasone (DECADRON) 4 MG tablet Take 10 tablets (40 mg total) by mouth once a week.  40 tablet  3  . docusate sodium (COLACE) 100 MG capsule Take 100 mg by mouth 2 (two) times daily. take 2 bid       . filgrastim (NEUPOGEN) 480 MCG/0.8ML SOLN injection Inject 960 mcg into the skin daily. Pt doing self injections and is to continue until he returns to [employer] when WBC > 3000      . gabapentin (NEURONTIN) 100 MG capsule Take 100 mg by mouth 3 (three) times daily.        . insulin glargine (LANTUS) 100 UNIT/ML injection Inject 64 Units into the skin 1 day or 1 dose.       Marland Kitchen oxycodone (OXY-IR) 5 MG capsule Take 5 mg by  mouth every 6 (six) hours as needed.        Marland Kitchen oxyCODONE (OXYCONTIN) 40 MG 12 hr tablet Take 1 tablet (40 mg total) by mouth every 12 (twelve) hours.  60 tablet  0  . Pomalidomide (POMALYST) 4 MG CAPS Take 4 mg by mouth daily.  21 capsule  0  . simvastatin (ZOCOR) 10 MG tablet Take 10 mg by mouth at bedtime.        . valsartan-hydrochlorothiazide (DIOVAN-HCT) 160-12.5 MG per tablet Take 1 tablet by mouth daily.         REVIEW OF SYSTEMS:  Pertinent items are noted in HPI.   PHYSICAL EXAMINATION: General appearance: alert, cooperative and no distress Head: Normocephalic, without obvious abnormality, atraumatic Neck: no adenopathy Lymph nodes: Cervical, supraclavicular, and axillary nodes normal. Resp: clear to auscultation bilaterally Cardio: regular rate and rhythm, S1, S2 normal, no murmur, click,  rub or gallop GI: soft, non-tender; bowel sounds normal; no masses,  no organomegaly Extremities: extremities normal, atraumatic, no cyanosis or edema Neurologic: Alert and oriented X 3, normal strength and tone. Normal symmetric reflexes. Normal coordination and gait  ECOG PERFORMANCE STATUS: 1 - Symptomatic but completely ambulatory  Blood pressure 157/94, pulse 71, temperature 97.9 F (36.6 C), temperature source Oral, height 5\' 6"  (1.676 m), weight 196 lb 6.4 oz (89.086 kg).  LABORATORY DATA: Lab Results  Component Value Date   WBC 2.3* 07/28/2011   HGB 13.3 07/28/2011   HCT 39.5 07/28/2011   MCV 94.6 07/28/2011   PLT 199 07/28/2011      Chemistry      Component Value Date/Time   NA 141 07/28/2011 1010   K 4.1 07/28/2011 1010   CL 105 07/28/2011 1010   CO2 26 07/28/2011 1010   BUN 31* 07/28/2011 1010   CREATININE 1.64* 07/28/2011 1010   GLU 293* 08/12/2009 1322      Component Value Date/Time   CALCIUM 9.0 07/28/2011 1010   ALKPHOS 98 07/28/2011 1010   AST 15 07/28/2011 1010   ALT 15 07/28/2011 1010   BILITOT 0.4 07/28/2011 1010       RADIOGRAPHIC STUDIES: No results found.  ASSESSMENT/PLAN: This is a very pleasant 66 years old [place] American male with multiple myeloma. The patient was considered for autologous peripheral blood stem cell transplant but unfortunately they were unable to collect a sufficient stem cells for the procedure. The patient was discussed with Dr. Arbutus Ped. He'll continue on the Pomalyst at 4 mg daily for 21 days every 28 days, and Decadron 40 mg by mouth on weekly basis. He is neutropenic today with an ANC of 0.8. A prescription for Neupogen 480 mcg subcutaneously for 3 days wit 3 refills will be called to [facility] for him. He will return in 2 weeks for a repeat CBC Differential , CMET and LDH. He will have a repeat myeloma panel drawn to reevaluate his disease in approximately 3 weeks after completing his second cycle of Pomalyst. He will follow up with Dr. Arbutus Ped in one  month to discuss the results of his repeat myeloma panel.  Laural Benes, Mauri Temkin E, PA-C   All questions were answered. The patient knows to call the clinic with any problems, questions or concerns. We can certainly see the patient much sooner if necessary.  I spent 20 minutes counseling the patient face to face. The total time spent in the appointment was 30 minutes.

## 2011-08-05 ENCOUNTER — Other Ambulatory Visit: Payer: Self-pay | Admitting: *Deleted

## 2011-08-05 DIAGNOSIS — R52 Pain, unspecified: Secondary | ICD-10-CM

## 2011-08-05 MED ORDER — OXYCODONE HCL 40 MG PO TB12: 40.0000 mg | ORAL_TABLET | Freq: Two times a day (BID) | ORAL | Status: DC

## 2011-08-10 ENCOUNTER — Other Ambulatory Visit: Payer: Self-pay | Admitting: *Deleted

## 2011-08-10 NOTE — Progress Notes (Signed)
Pt's wife called stating that pt's pain has increased in his shin and ankle where for 5-15 minutes he is in excruciating pain that is not resolved with 5mg  oxycodone.  He also has about 5 red, scabbed over bumps on various places on his body that are tender to the touch.  Hope also stated she never got a call about the neupogen.  Per Dr Donnald Garre, pt can see Adrena tomorrow.  Informed Hope that Adrena had called in rx for neupogen and left msg with Mr Payer regarding rx.  Hope verbalized understanding. Onc tx scheduled filled out for lab and f/u appt tomorrow.  SLJ

## 2011-08-11 ENCOUNTER — Telehealth: Payer: Self-pay | Admitting: Internal Medicine

## 2011-08-11 ENCOUNTER — Ambulatory Visit (HOSPITAL_COMMUNITY)
Admission: RE | Admit: 2011-08-11 | Discharge: 2011-08-11 | Disposition: A | Discharge status: Home / Self Care | Payer: 59 | Source: Ambulatory Visit | Attending: Physician Assistant | Admitting: Physician Assistant

## 2011-08-11 ENCOUNTER — Other Ambulatory Visit (HOSPITAL_BASED_OUTPATIENT_CLINIC_OR_DEPARTMENT_OTHER): Payer: 59 | Admitting: Lab

## 2011-08-11 ENCOUNTER — Ambulatory Visit (HOSPITAL_BASED_OUTPATIENT_CLINIC_OR_DEPARTMENT_OTHER): Payer: 59 | Admitting: Physician Assistant

## 2011-08-11 VITALS — BP 145/87 | HR 72 | Temp 97.8°F | Ht 66.0 in | Wt 194.0 lb

## 2011-08-11 DIAGNOSIS — C9002 Multiple myeloma in relapse: Secondary | ICD-10-CM

## 2011-08-11 DIAGNOSIS — M79609 Pain in unspecified limb: Secondary | ICD-10-CM | POA: Insufficient documentation

## 2011-08-11 DIAGNOSIS — M898X9 Other specified disorders of bone, unspecified site: Secondary | ICD-10-CM | POA: Insufficient documentation

## 2011-08-11 LAB — CBC WITH DIFFERENTIAL/PLATELET
BASO%: 2.2 % — ABNORMAL HIGH (ref 0.0–2.0)
EOS%: 9.5 % — ABNORMAL HIGH (ref 0.0–7.0)
HCT: 40.5 % (ref 38.4–49.9)
MCH: 31.4 pg (ref 27.2–33.4)
MCHC: 33.8 g/dL (ref 32.0–36.0)
NEUT%: 48.6 % (ref 39.0–75.0)
lymph#: 0.4 10*3/uL — ABNORMAL LOW (ref 0.9–3.3)

## 2011-08-11 LAB — COMPREHENSIVE METABOLIC PANEL
Albumin: 3.8 g/dL (ref 3.5–5.2)
Alkaline Phosphatase: 116 U/L (ref 39–117)
BUN: 28 mg/dL — ABNORMAL HIGH (ref 6–23)
Glucose, Bld: 221 mg/dL — ABNORMAL HIGH (ref 70–99)
Potassium: 4.2 mEq/L (ref 3.5–5.3)
Total Bilirubin: 0.6 mg/dL (ref 0.3–1.2)

## 2011-08-11 NOTE — Telephone Encounter (Signed)
Gave pt appt calendar for July 2013 MD only

## 2011-08-11 NOTE — Telephone Encounter (Signed)
Pt sent to x-ray for scan, walk in anytime

## 2011-08-14 NOTE — Progress Notes (Signed)
[facility] Telephone:(336) [phone]   Fax:(336) [phone]  OFFICE PROGRESS NOTE   DIAGNOSIS:  Multiple myeloma IgA subtype diagnosed in November, 2009.   PRIOR THERAPY:  1. Status post seven cycles of systemic chemotherapy with Revlimid and low-dose Betatron with partial response. The last dose was given June, 2010. 2. Status post chemotherapy with weekly Velcade in a patient on Decadron 40 mg on a weekly basis, status post 75 weekly doses. The last dose was given May 04, 2008. 3. Status post treatment again with Velcade and Decadron. The patient was awaiting stem cell transplant at [facility]. Last dose was given Jul 16, 2009. 4. The patient was not considered a good candidate for autologous stem cell transplant at that time secondary to poor mobilization of his stem cells.  5. Status post six cycles of systemic chemotherapy with Velcade, Cytoxan and Betatron. Last dose was given March 25, 2010. This continued secondary to his disease progression. 6. Status post ten cycles of systemic chemotherapy with Velcade. Doxil and Decadron. Last dose was given on 10/31/2010. Currently on hold because of concern about cardiac toxicity.  CURRENT THERAPY: 1. Pomalyst 4 mg by mouth daily for 21 days every 28 days 2. Decadron 40 mg by mouth weekly  INTERVAL HISTORY: Rodney Green 66 y.o. male returns to the clinic today for followup visit. He has completed almost 2 cycles of Pomalyst. He presents complaining of increased bone pain primarily involving the left mid shin area extending down to the left mid foot. He states that it is not a muscle cramp. The pain is so severe that it in incapacitated him and the last for anywhere from 3-15 minutes. He is been taking his long-acting pain medication but states that his short acting pain medicine is of no help. He also complains of some "bumps" that are scattered over his body. They become a raised warm lesion, do not  appear to express any purulent material and then scab over. They can be painful. He had one in his mid back and currently has lesions in the left buttock left upper thigh and right knee. They can be somewhat pruritic.he did not take his Neupogen as instructed. He did not arrange for her to be delivered. His wife accompanies him today and states that they have just come back from the beach.  He denied having any significant weight loss or night sweats. He has no chest pain or shortness of breath.   MEDICAL HISTORY: Past Medical History  Diagnosis Date  . Allergy   . Diabetes mellitus   . Hypertension   . Hepatitis C   . Multiple myeloma     ALLERGIES:  is allergic to ace inhibitors.  MEDICATIONS:  Current Outpatient Prescriptions  Medication Sig Dispense Refill  . amitriptyline (ELAVIL) 50 MG tablet TAKE 1 TABLET BY MOUTH AT BEDTIME  30 tablet  3  . amLODipine (NORVASC) 5 MG tablet Take 5 mg by mouth daily.        . ARIPiprazole (ABILIFY) 5 MG tablet Take 5 mg by mouth 1 day or 1 dose.        Marland Kitchen aspirin 81 MG tablet Take 81 mg by mouth daily.      Marland Kitchen atenolol (TENORMIN) 100 MG tablet Take 100 mg by mouth daily.        Marland Kitchen CIALIS 20 MG tablet       . dexamethasone (DECADRON) 4 MG tablet Take 10 tablets (40 mg  total) by mouth once a week.  40 tablet  3  . docusate sodium (COLACE) 100 MG capsule Take 100 mg by mouth 2 (two) times daily. take 2 bid       . filgrastim (NEUPOGEN) 480 MCG/0.8ML SOLN injection Inject 960 mcg into the skin daily. Pt doing self injections and is to continue until he returns to [employer] when WBC > 3000      . gabapentin (NEURONTIN) 100 MG capsule Take 100 mg by mouth 3 (three) times daily.        . insulin glargine (LANTUS) 100 UNIT/ML injection Inject 64 Units into the skin 1 day or 1 dose.       Marland Kitchen oxycodone (OXY-IR) 5 MG capsule Take 5 mg by mouth every 6 (six) hours as needed.        Marland Kitchen oxyCODONE (OXYCONTIN) 40 MG 12 hr tablet Take 1 tablet (40 mg total) by mouth every 12  (twelve) hours.  60 tablet  0  . Pomalidomide (POMALYST) 4 MG CAPS Take 4 mg by mouth daily.  21 capsule  0  . simvastatin (ZOCOR) 10 MG tablet Take 10 mg by mouth at bedtime.        . valsartan-hydrochlorothiazide (DIOVAN-HCT) 160-12.5 MG per tablet Take 1 tablet by mouth daily.         REVIEW OF SYSTEMS:  Pertinent items are noted in HPI.   PHYSICAL EXAMINATION: General appearance: alert, cooperative and no distress Head: Normocephalic, without obvious abnormality, atraumatic Neck: no adenopathy Lymph nodes: Cervical, supraclavicular, and axillary nodes normal. Resp: clear to auscultation bilaterally Cardio: regular rate and rhythm, S1, S2 normal, no murmur, click, rub or gallop GI: soft, non-tender; bowel sounds normal; no masses,  no organomegaly Extremities: extremities normal, atraumatic, no cyanosis or edema Neurologic: Alert and oriented X 3, normal strength and tone. Normal symmetric reflexes. Normal coordination and gait Skin: Examination of the skin on the posterior left thigh and right popliteal fossa there are raised warm lesions that are firm no areas of fluctuance or purulent material noted is a similar lesion on his left buttock that has eschar in place as does the lesion on his left mid back patch also has eschar in place and seems to be the most well-healed.  ECOG PERFORMANCE STATUS: 1 - Symptomatic but completely ambulatory  Blood pressure 145/87, pulse 72, temperature 97.8 F (36.6 C), temperature source Oral, height 5\' 6"  (1.676 m), weight 194 lb (87.998 kg).  LABORATORY DATA: Lab Results  Component Value Date   WBC 2.0* 08/11/2011   HGB 13.7 08/11/2011   HCT 40.5 08/11/2011   MCV 92.9 08/11/2011   PLT 192 08/11/2011      Chemistry      Component Value Date/Time   NA 136 08/11/2011 1046   K 4.2 08/11/2011 1046   CL 100 08/11/2011 1046   CO2 26 08/11/2011 1046   BUN 28* 08/11/2011 1046   CREATININE 1.48* 08/11/2011 1046   GLU 293* 08/12/2009 1322      Component  Value Date/Time   CALCIUM 9.6 08/11/2011 1046   ALKPHOS 116 08/11/2011 1046   AST 11 08/11/2011 1046   ALT 12 08/11/2011 1046   BILITOT 0.6 08/11/2011 1046       RADIOGRAPHIC STUDIES: No results found.  ASSESSMENT/PLAN: This is a very pleasant 66 years old [place] American male with multiple myeloma. The patient was considered for autologous peripheral blood stem cell transplant but unfortunately they were unable to collect a sufficient stem cells for  the procedure. The patient was discussed with Dr. Arbutus Ped, who also had a discussion with Mr.Held and his wife regarding his presenting complaints.Dr. Shirline Frees recommended that he continue on the palm list as we are limited as to any further therapies for him. He'll continue on the Pomalyst at 4 mg daily for 21 days every 28 days, and Decadron 40 mg by mouth on weekly basis. He is neutropenic today with an ANC of 1.0. He is to take his Neupogen as previously instructed 480 mcg subcutaneously for 3 days. He is to keep his previously scheduled appointments for his upcoming protein studies to be drawn. He will have a x-ray today of the left tibia and fibula looking for possible lytic lesions related to his multiple myeloma. He is to followup with Dr. Arbutus Ped as previously scheduled to discuss the results of his repeat protein studies.  Laural Benes, Olubunmi Rothenberger E, PA-C   All questions were answered. The patient knows to call the clinic with any problems, questions or concerns. We can certainly see the patient much sooner if necessary.  I spent 20 minutes counseling the patient face to face. The total time spent in the appointment was 30 minutes.

## 2011-08-17 ENCOUNTER — Ambulatory Visit: Payer: 59 | Admitting: Physician Assistant

## 2011-08-17 ENCOUNTER — Telehealth: Payer: Self-pay | Admitting: Internal Medicine

## 2011-08-17 ENCOUNTER — Other Ambulatory Visit: Payer: 59 | Admitting: Lab

## 2011-08-17 ENCOUNTER — Ambulatory Visit (HOSPITAL_BASED_OUTPATIENT_CLINIC_OR_DEPARTMENT_OTHER): Payer: 59 | Admitting: Lab

## 2011-08-17 ENCOUNTER — Telehealth: Payer: Self-pay | Admitting: Medical Oncology

## 2011-08-17 DIAGNOSIS — C9002 Multiple myeloma in relapse: Secondary | ICD-10-CM

## 2011-08-17 DIAGNOSIS — D709 Neutropenia, unspecified: Secondary | ICD-10-CM

## 2011-08-17 LAB — CBC WITH DIFFERENTIAL/PLATELET
Basophils Absolute: 0 10*3/uL (ref 0.0–0.1)
Eosinophils Absolute: 0.3 10*3/uL (ref 0.0–0.5)
HGB: 13.6 g/dL (ref 13.0–17.1)
MCV: 89.5 fL (ref 79.3–98.0)
MONO#: 0.7 10*3/uL (ref 0.1–0.9)
MONO%: 19.1 % — ABNORMAL HIGH (ref 0.0–14.0)
NEUT#: 2.2 10*3/uL (ref 1.5–6.5)
RDW: 13.1 % (ref 11.0–14.6)
WBC: 3.8 10*3/uL — ABNORMAL LOW (ref 4.0–10.3)

## 2011-08-17 NOTE — Telephone Encounter (Signed)
call sent from diane b,pt missed lab and mid lev appt today and per pt per db pt only needs  to r/s lab,lab done as an add on for today.

## 2011-08-17 NOTE — Telephone Encounter (Signed)
Message left for pt and wife -pt needs to come in today or tomorrow  for his labs. Transferred call to scheduler.

## 2011-08-19 LAB — LACTATE DEHYDROGENASE: LDH: 158 U/L (ref 94–250)

## 2011-08-19 LAB — COMPREHENSIVE METABOLIC PANEL
ALT: 15 U/L (ref 0–53)
Albumin: 3.8 g/dL (ref 3.5–5.2)
CO2: 29 mEq/L (ref 19–32)
Calcium: 9.2 mg/dL (ref 8.4–10.5)
Chloride: 101 mEq/L (ref 96–112)
Glucose, Bld: 334 mg/dL — ABNORMAL HIGH (ref 70–99)
Potassium: 4.6 mEq/L (ref 3.5–5.3)
Sodium: 135 mEq/L (ref 135–145)
Total Bilirubin: 0.6 mg/dL (ref 0.3–1.2)
Total Protein: 6.5 g/dL (ref 6.0–8.3)

## 2011-08-19 LAB — SPEP & IFE WITH QIG
Albumin ELP: 53 % — ABNORMAL LOW (ref 55.8–66.1)
Alpha-1-Globulin: 6.3 % — ABNORMAL HIGH (ref 2.9–4.9)
Alpha-2-Globulin: 15.4 % — ABNORMAL HIGH (ref 7.1–11.8)
IgM, Serum: 42 mg/dL (ref 41–251)
Total Protein, Serum Electrophoresis: 6.5 g/dL (ref 6.0–8.3)

## 2011-08-19 LAB — BETA 2 MICROGLOBULIN, SERUM: Beta-2 Microglobulin: 5.81 mg/L — ABNORMAL HIGH (ref 1.01–1.73)

## 2011-08-25 ENCOUNTER — Ambulatory Visit (HOSPITAL_BASED_OUTPATIENT_CLINIC_OR_DEPARTMENT_OTHER): Payer: 59 | Admitting: Internal Medicine

## 2011-08-25 ENCOUNTER — Telehealth: Payer: Self-pay | Admitting: Internal Medicine

## 2011-08-25 ENCOUNTER — Other Ambulatory Visit: Payer: Self-pay | Admitting: *Deleted

## 2011-08-25 VITALS — BP 130/85 | HR 59 | Temp 97.4°F | Ht 66.0 in | Wt 187.6 lb

## 2011-08-25 DIAGNOSIS — C9002 Multiple myeloma in relapse: Secondary | ICD-10-CM

## 2011-08-25 DIAGNOSIS — C9000 Multiple myeloma not having achieved remission: Secondary | ICD-10-CM

## 2011-08-25 MED ORDER — DEXAMETHASONE 4 MG PO TABS: 40.0000 mg | ORAL_TABLET | ORAL | Status: DC

## 2011-08-25 MED ORDER — POMALIDOMIDE 4 MG PO CAPS: 4.0000 mg | ORAL_CAPSULE | Freq: Every day | ORAL | Status: DC

## 2011-08-25 NOTE — Telephone Encounter (Signed)
AUTHORIZATION #[id number] NOTIFIED PT.'S WIFE TO HAVE PT. TAKE HIS SURVEY. CELGENE PHONE NUMBER GIVEN TO PT.'S WIFE.

## 2011-08-25 NOTE — Telephone Encounter (Signed)
gve the pt his July 2013 appt calendar °

## 2011-08-25 NOTE — Progress Notes (Signed)
[facility] Telephone:([phone]) [phone]   [phone]  OFFICE PROGRESS NOTE  Katy Apo, MD [address] [place] [address]  DIAGNOSIS:  Multiple myeloma IgA subtype diagnosed in November, 2009.   PRIOR THERAPY:  1. Status post seven cycles of systemic chemotherapy with Revlimid and low-dose Betatron with partial response. The last dose was given June, 2010. 2. Status post chemotherapy with weekly Velcade in a patient on Decadron 40 mg on a weekly basis, status post 75 weekly doses. The last dose was given May 04, 2008. 3. Status post treatment again with Velcade and Decadron. The patient was awaiting stem cell transplant at [facility]. Last dose was given Jul 16, 2009. 4. The patient was not considered a good candidate for autologous stem cell transplant at that time secondary to poor mobilization of his stem cells.  5. Status post six cycles of systemic chemotherapy with Velcade, Cytoxan and Betatron. Last dose was given March 25, 2010. This continued secondary to his disease progression. 6. Status post ten cycles of systemic chemotherapy with Velcade. Doxil and Decadron. Last dose was given on 10/31/2010. Currently on hold because of concern about cardiac toxicity.  CURRENT THERAPY:  1. Pomalyst 4 mg by mouth daily for 21 days every 28 days 2. Decadron 40 mg by mouth weekly  INTERVAL HISTORY: Rodney Green 66 y.o. male returns to the clinic today for followup visit accompanied by his wife. The patient is feeling fine today except for aching pain in the leg and lower back. He is tolerating his treatment with Pomalyst fairly well except for fatigue. He denied having any significant skin rash or diarrhea. He has no nausea or vomiting. He lost few pounds since his last visit. He has repeat CBC, comprehensive metabolic panel, LDH and myeloma panel performed recently and he is here today for evaluation and discussion of his  lab results.  MEDICAL HISTORY: Past Medical History  Diagnosis Date  . Allergy   . Diabetes mellitus   . Hypertension   . Hepatitis C   . Multiple myeloma     ALLERGIES:  is allergic to ace inhibitors.  MEDICATIONS:  Current Outpatient Prescriptions  Medication Sig Dispense Refill  . amitriptyline (ELAVIL) 50 MG tablet TAKE 1 TABLET BY MOUTH AT BEDTIME  30 tablet  3  . amLODipine (NORVASC) 5 MG tablet Take 5 mg by mouth daily.        . ARIPiprazole (ABILIFY) 5 MG tablet Take 5 mg by mouth 1 day or 1 dose.        Marland Kitchen aspirin 81 MG tablet Take 81 mg by mouth daily.      Marland Kitchen atenolol (TENORMIN) 100 MG tablet Take 100 mg by mouth daily.        Marland Kitchen CIALIS 20 MG tablet       . dexamethasone (DECADRON) 4 MG tablet Take 10 tablets (40 mg total) by mouth once a week.  40 tablet  3  . dextromethorphan-guaiFENesin (MUCINEX DM) 30-600 MG per 12 hr tablet Take 1 tablet by mouth every 12 (twelve) hours.      . docusate sodium (COLACE) 100 MG capsule Take 100 mg by mouth 2 (two) times daily. take 2 bid       . doxycycline (DORYX) 100 MG DR capsule Take 100 mg by mouth 2 (two) times daily. X 10 days, rx by PCP      . filgrastim (NEUPOGEN) 480 MCG/0.8ML SOLN injection  Inject 960 mcg into the skin daily. Pt doing self injections and is to continue until he returns to [employer] when WBC > 3000      . gabapentin (NEURONTIN) 100 MG capsule Take 100 mg by mouth 3 (three) times daily.        . insulin glargine (LANTUS) 100 UNIT/ML injection Inject 64 Units into the skin 1 day or 1 dose.       Marland Kitchen oxycodone (OXY-IR) 5 MG capsule Take 5 mg by mouth every 6 (six) hours as needed.        Marland Kitchen oxyCODONE (OXYCONTIN) 40 MG 12 hr tablet Take 1 tablet (40 mg total) by mouth every 12 (twelve) hours.  60 tablet  0  . Pomalidomide (POMALYST) 4 MG CAPS Take 4 mg by mouth daily.  21 capsule  0  . simvastatin (ZOCOR) 10 MG tablet Take 10 mg by mouth at bedtime.        . valsartan-hydrochlorothiazide (DIOVAN-HCT) 160-12.5 MG per tablet  Take 1 tablet by mouth daily.          REVIEW OF SYSTEMS:  A comprehensive review of systems was negative except for: Constitutional: positive for fatigue and weight loss Musculoskeletal: positive for arthralgias and back pain   PHYSICAL EXAMINATION: General appearance: alert, cooperative and no distress Head: Normocephalic, without obvious abnormality, atraumatic Neck: no adenopathy Lymph nodes: Cervical, supraclavicular, and axillary nodes normal. Resp: clear to auscultation bilaterally Cardio: regular rate and rhythm, S1, S2 normal, no murmur, click, rub or gallop GI: soft, non-tender; bowel sounds normal; no masses,  no organomegaly Extremities: extremities normal, atraumatic, no cyanosis or edema Neurologic: Alert and oriented X 3, normal strength and tone. Normal symmetric reflexes. Normal coordination and gait  ECOG PERFORMANCE STATUS: 1 - Symptomatic but completely ambulatory  Blood pressure 130/85, pulse 59, temperature 97.4 F (36.3 C), temperature source Oral, height 5\' 6"  (1.676 m), weight 187 lb 9.6 oz (85.095 kg).  LABORATORY DATA: Lab Results  Component Value Date   WBC 3.8* 08/17/2011   HGB 13.6 08/17/2011   HCT 39.2 08/17/2011   MCV 89.5 08/17/2011   PLT 139* 08/17/2011      Chemistry      Component Value Date/Time   NA 135 08/17/2011 1310   K 4.6 08/17/2011 1310   CL 101 08/17/2011 1310   CO2 29 08/17/2011 1310   BUN 37* 08/17/2011 1310   CREATININE 1.62* 08/17/2011 1310   GLU 293* 08/12/2009 1322      Component Value Date/Time   CALCIUM 9.2 08/17/2011 1310   ALKPHOS 134* 08/17/2011 1310   AST 10 08/17/2011 1310   ALT 15 08/17/2011 1310   BILITOT 0.6 08/17/2011 1310       RADIOGRAPHIC STUDIES: Dg Tibia/fibula Left  08/11/2011  *RADIOLOGY REPORT*  Clinical Data: Pain.  History of multiple myeloma.  LEFT TIBIA AND FIBULA - 2 VIEW  Comparison: Left ankle radiographs 10/16/2010  Findings: No acute or healing fracture or focal lytic lesion is identified in the tibia  or fibula.  A 2 cm benign appearing exostosis extending laterally from the distal tibial diaphysis is stable.  IMPRESSION:  1.  No acute bony abnormality.  No radiographic evidence of multiple myeloma in the tibia-fibula. 2.  Stable 2 cm exostosis arising from the lateral cortex of the distal tibial diaphysis.  Original Report Authenticated By: Britta Mccreedy, M.D.    ASSESSMENT: This is a very pleasant 66 years old [place] American male with multiple myeloma currently on treatment with Pomalyst.  The patient is doing fine and tolerating his treatment fairly well but he has some evidence for mild disease progression.  PLAN: I discussed the lab result with the patient and his wife today.  I recommended for him to continue on treatment with Pomalyst for 3 more months. If he continues to have evidence for disease progression, I would consider him for treatment with Kyprolis. The patient and his wife agreed to the current plan. I may also consider resuming treatment with Zometa for his bone disease. The patient would come back for followup visit in one month for reevaluation.  All questions were answered. The patient knows to call the clinic with any problems, questions or concerns. We can certainly see the patient much sooner if necessary.  I spent 15 minutes counseling the patient face to face. The total time spent in the appointment was 25 minutes.

## 2011-08-28 ENCOUNTER — Telehealth: Payer: Self-pay | Admitting: *Deleted

## 2011-08-28 NOTE — Telephone Encounter (Signed)
Per crystal she gave the patient his appointments on 08-25-2011

## 2011-08-31 ENCOUNTER — Encounter: Payer: Self-pay | Admitting: *Deleted

## 2011-08-31 NOTE — Progress Notes (Signed)
RECEIVED A FAX FROM [employer] CONCERNING A CONFIRMATION OF PRESCRIPTION SHIPMENT FOR POMALYST.

## 2011-09-03 ENCOUNTER — Other Ambulatory Visit: Payer: Self-pay | Admitting: Medical Oncology

## 2011-09-03 DIAGNOSIS — R52 Pain, unspecified: Secondary | ICD-10-CM

## 2011-09-03 MED ORDER — OXYCODONE HCL 40 MG PO TB12: 40.0000 mg | ORAL_TABLET | Freq: Two times a day (BID) | ORAL | Status: DC

## 2011-09-03 NOTE — Telephone Encounter (Signed)
Requested refill - to [facility] for review.

## 2011-09-03 NOTE — Telephone Encounter (Signed)
Prescription put in the injection room for pickup

## 2011-09-17 ENCOUNTER — Encounter: Payer: Self-pay | Admitting: Dietician

## 2011-09-17 NOTE — Progress Notes (Signed)
Brief Out-patient Oncology Nutrition Note  Reason: Patient Screened Positive For Nutrition Risk For Unintentional Weight Loss and Decreased Appetite.   Rodney Green is a 66 year old male patient of Dr. Shirline Frees, diagnosed with multiple myeloma. Contacted patient via telephone for positive nutrition risk. He reported his appetite has been up and down. He reported he eats all day long, despite appetite. He reported a UBW of 197-205 lb.Marland Kitchen He reported his appetite has gotten a little worse since he started his new chemo, but he continues to eat frequently throughout the day. He reported he drinks a few Ensure nutrition supplements daily. He stated he has attended several classes at the cancer center.   Wt Readings from Last 10 Encounters:  08/25/11 187 lb 9.6 oz (85.095 kg)  08/11/11 194 lb (87.998 kg)  07/28/11 196 lb 6.4 oz (89.086 kg)  07/14/11 197 lb (89.359 kg)  06/23/11 203 lb 1.6 oz (92.126 kg)  03/02/11 198 lb 4.8 oz (89.948 kg)  12/30/10 202 lb 8 oz (91.853 kg)  11/28/10 205 lb 8 oz (93.214 kg)  *weight has been trending down.    I have educated the patient on high calorie, high protein diet. I have encouraged him to continue to drink Ensure nutrition supplements daily and to continue to eat small frequent meals throughout the day.  The patient was without any further nutrition related questions. Instructed patient to contact RD for future questions.   RD available for nutrition needs.   Rodney Finn, MS, RD, LDN [phone]

## 2011-09-22 ENCOUNTER — Ambulatory Visit (HOSPITAL_BASED_OUTPATIENT_CLINIC_OR_DEPARTMENT_OTHER): Payer: 59 | Admitting: Physician Assistant

## 2011-09-22 ENCOUNTER — Telehealth: Payer: Self-pay | Admitting: Internal Medicine

## 2011-09-22 ENCOUNTER — Encounter: Payer: Self-pay | Admitting: Internal Medicine

## 2011-09-22 ENCOUNTER — Encounter: Payer: Self-pay | Admitting: *Deleted

## 2011-09-22 ENCOUNTER — Other Ambulatory Visit (HOSPITAL_BASED_OUTPATIENT_CLINIC_OR_DEPARTMENT_OTHER): Payer: 59 | Admitting: Lab

## 2011-09-22 ENCOUNTER — Encounter: Payer: Self-pay | Admitting: Physician Assistant

## 2011-09-22 VITALS — BP 140/86 | HR 72 | Temp 97.4°F | Ht 66.0 in | Wt 192.2 lb

## 2011-09-22 DIAGNOSIS — C9002 Multiple myeloma in relapse: Secondary | ICD-10-CM

## 2011-09-22 DIAGNOSIS — C9000 Multiple myeloma not having achieved remission: Secondary | ICD-10-CM

## 2011-09-22 DIAGNOSIS — I10 Essential (primary) hypertension: Secondary | ICD-10-CM

## 2011-09-22 DIAGNOSIS — E119 Type 2 diabetes mellitus without complications: Secondary | ICD-10-CM

## 2011-09-22 LAB — CBC WITH DIFFERENTIAL/PLATELET
Basophils Absolute: 0 10*3/uL (ref 0.0–0.1)
Eosinophils Absolute: 0.1 10*3/uL (ref 0.0–0.5)
HCT: 36.4 % — ABNORMAL LOW (ref 38.4–49.9)
HGB: 12.2 g/dL — ABNORMAL LOW (ref 13.0–17.1)
LYMPH%: 29.9 % (ref 14.0–49.0)
MCH: 31.5 pg (ref 27.2–33.4)
MCV: 94 fL (ref 79.3–98.0)
MONO%: 19.9 % — ABNORMAL HIGH (ref 0.0–14.0)
NEUT#: 0.7 10*3/uL — ABNORMAL LOW (ref 1.5–6.5)
NEUT%: 42.8 % (ref 39.0–75.0)
Platelets: 212 10*3/uL (ref 140–400)
RDW: 15.3 % — ABNORMAL HIGH (ref 11.0–14.6)

## 2011-09-22 LAB — COMPREHENSIVE METABOLIC PANEL
Albumin: 3.9 g/dL (ref 3.5–5.2)
Alkaline Phosphatase: 111 U/L (ref 39–117)
BUN: 26 mg/dL — ABNORMAL HIGH (ref 6–23)
Creatinine, Ser: 1.45 mg/dL — ABNORMAL HIGH (ref 0.50–1.35)
Glucose, Bld: 229 mg/dL — ABNORMAL HIGH (ref 70–99)
Potassium: 4.3 mEq/L (ref 3.5–5.3)

## 2011-09-22 NOTE — Progress Notes (Signed)
Called [facility] @ [id number], neupogen is approved from 08/23/11-10/22/11.

## 2011-09-22 NOTE — Progress Notes (Signed)
[facility] Telephone:([phone]) [phone]   Fax:(336) [phone]  OFFICE PROGRESS NOTE  Katy Apo, MD [address] [place] [address]  DIAGNOSIS:  Multiple myeloma IgA subtype diagnosed in November, 2009.   PRIOR THERAPY:  1. Status post seven cycles of systemic chemotherapy with Revlimid and low-dose Betatron with partial response. The last dose was given June, 2010. 2. Status post chemotherapy with weekly Velcade in a patient on Decadron 40 mg on a weekly basis, status post 75 weekly doses. The last dose was given May 04, 2008. 3. Status post treatment again with Velcade and Decadron. The patient was awaiting stem cell transplant at [facility]. Last dose was given Jul 16, 2009. 4. The patient was not considered a good candidate for autologous stem cell transplant at that time secondary to poor mobilization of his stem cells.  5. Status post six cycles of systemic chemotherapy with Velcade, Cytoxan and Betatron. Last dose was given March 25, 2010. This continued secondary to his disease progression. 6. Status post ten cycles of systemic chemotherapy with Velcade. Doxil and Decadron. Last dose was given on 10/31/2010. Currently on hold because of concern about cardiac toxicity.  CURRENT THERAPY:  1. Pomalyst 4 mg by mouth daily for 21 days every 28 days, completing cycle #4 today 2. Decadron 40 mg by mouth weekly  INTERVAL HISTORY: Rodney Green 66 y.o. male returns to the clinic today for followup visit. Today Mr. Davison states that he is tolerating the Pomalyst relatively well. He denies any problems with bleeding or bruising, no fever or chills. He does periodically have some night sweats. He continues to have aching pain in the leg and lower back. He is tolerating his treatment with Pomalyst fairly well except for fatigue. He denied having any significant skin rash or diarrhea. He has no nausea or vomiting. He l has gained about  5 pounds since his last visit. He has repeat CBC, comprehensive metabolic panel, and LDH  and he is here today for evaluation and discussion of his lab results.  MEDICAL HISTORY: Past Medical History  Diagnosis Date  . Allergy   . Diabetes mellitus   . Hypertension   . Hepatitis C   . Multiple myeloma     ALLERGIES:  is allergic to ace inhibitors.  MEDICATIONS:  Current Outpatient Prescriptions  Medication Sig Dispense Refill  . amitriptyline (ELAVIL) 50 MG tablet TAKE 1 TABLET BY MOUTH AT BEDTIME  30 tablet  3  . amLODipine (NORVASC) 5 MG tablet Take 5 mg by mouth daily.        . ARIPiprazole (ABILIFY) 5 MG tablet Take 5 mg by mouth 1 day or 1 dose.        Marland Kitchen aspirin 81 MG tablet Take 81 mg by mouth daily.      Marland Kitchen atenolol (TENORMIN) 100 MG tablet Take 100 mg by mouth daily.        Marland Kitchen CIALIS 20 MG tablet       . dexamethasone (DECADRON) 4 MG tablet Take 10 tablets (40 mg total) by mouth once a week.  40 tablet  3  . dextromethorphan-guaiFENesin (MUCINEX DM) 30-600 MG per 12 hr tablet Take 1 tablet by mouth every 12 (twelve) hours.      . docusate sodium (COLACE) 100 MG capsule Take 100 mg by mouth 2 (two) times daily. take 2 bid       . doxycycline (DORYX) 100 MG DR capsule  Take 100 mg by mouth 2 (two) times daily. X 10 days, rx by PCP      . filgrastim (NEUPOGEN) 480 MCG/0.8ML SOLN injection Inject 960 mcg into the skin daily. Pt doing self injections and is to continue until he returns to [employer] when WBC > 3000      . gabapentin (NEURONTIN) 100 MG capsule Take 100 mg by mouth 3 (three) times daily.        . insulin glargine (LANTUS) 100 UNIT/ML injection Inject 64 Units into the skin 1 day or 1 dose.       Marland Kitchen LEVITRA 20 MG tablet       . oxycodone (OXY-IR) 5 MG capsule Take 5 mg by mouth every 6 (six) hours as needed.        Marland Kitchen oxyCODONE (OXYCONTIN) 40 MG 12 hr tablet Take 1 tablet (40 mg total) by mouth every 12 (twelve) hours.  60 tablet  0  . pomalidomide (POMALYST) 4 MG capsule Take  1 capsule (4 mg total) by mouth daily.  21 capsule  0  . simvastatin (ZOCOR) 10 MG tablet Take 10 mg by mouth at bedtime.        . valsartan-hydrochlorothiazide (DIOVAN-HCT) 160-12.5 MG per tablet Take 1 tablet by mouth daily.          REVIEW OF SYSTEMS:  Pertinent items are noted in HPI.   PHYSICAL EXAMINATION: General appearance: alert, cooperative and no distress Head: Normocephalic, without obvious abnormality, atraumatic Neck: no adenopathy Lymph nodes: Cervical, supraclavicular, and axillary nodes normal. Resp: clear to auscultation bilaterally Cardio: regular rate and rhythm, S1, S2 normal, no murmur, click, rub or gallop GI: soft, non-tender; bowel sounds normal; no masses,  no organomegaly Extremities: extremities normal, atraumatic, no cyanosis or edema Neurologic: Alert and oriented X 3, normal strength and tone. Normal symmetric reflexes. Normal coordination and gait  ECOG PERFORMANCE STATUS: 1 - Symptomatic but completely ambulatory  Blood pressure 140/86, pulse 72, temperature 97.4 F (36.3 C), temperature source Oral, height 5\' 6"  (1.676 m), weight 192 lb 3.2 oz (87.181 kg).  LABORATORY DATA: Lab Results  Component Value Date   WBC 1.6* 09/22/2011   HGB 12.2* 09/22/2011   HCT 36.4* 09/22/2011   MCV 94.0 09/22/2011   PLT 212 09/22/2011      Chemistry      Component Value Date/Time   NA 138 09/22/2011 1113   K 4.3 09/22/2011 1113   CL 104 09/22/2011 1113   CO2 25 09/22/2011 1113   BUN 26* 09/22/2011 1113   CREATININE 1.45* 09/22/2011 1113   GLU 293* 08/12/2009 1322      Component Value Date/Time   CALCIUM 9.5 09/22/2011 1113   ALKPHOS 111 09/22/2011 1113   AST 13 09/22/2011 1113   ALT 14 09/22/2011 1113   BILITOT 0.7 09/22/2011 1113       RADIOGRAPHIC STUDIES: Dg Tibia/fibula Left  08/11/2011  *RADIOLOGY REPORT*  Clinical Data: Pain.  History of multiple myeloma.  LEFT TIBIA AND FIBULA - 2 VIEW  Comparison: Left ankle radiographs 10/16/2010  Findings: No acute or  healing fracture or focal lytic lesion is identified in the tibia or fibula.  A 2 cm benign appearing exostosis extending laterally from the distal tibial diaphysis is stable.  IMPRESSION:  1.  No acute bony abnormality.  No radiographic evidence of multiple myeloma in the tibia-fibula. 2.  Stable 2 cm exostosis arising from the lateral cortex of the distal tibial diaphysis.  Original Report Authenticated By: Britta Mccreedy,  M.D.    ASSESSMENT/PLAN: This is a very pleasant 66 years old African American male with multiple myeloma currently on treatment with Pomalyst. The patient is doing fine and tolerating his treatment fairly well but he has some evidence for mild disease progression. Patient was discussed with Dr. Arbutus Ped. Like him to complete her 3 additional months of therapy with polycystic prior to rechecking protein studies again. His last protein studies were drawn 08/17/2011 the next one will be due approximately 11/17/2011. The patient will continue on the palm list at its current dose. He will require Neupogen due to his ANC of 0.7 at 300 mg daily for 3 days. He'll return in one month with repeat CBC differential C. met and LDH. Should he show continued disease progression with his next myeloma panel Dr. Arbutus Ped we'll consider treating him with Kyprolis. He may also consider resuming treatment with Zometa for her to his bone disease.   Laural Benes, Avayah Raffety E, PA-C   All questions were answered. The patient knows to call the clinic with any problems, questions or concerns. We can certainly see the patient much sooner if necessary.  I spent 20 minutes counseling the patient face to face. The total time spent in the appointment was 30 minutes.

## 2011-09-22 NOTE — Progress Notes (Signed)
Received fax from [employer] that prior auth is required for Neupogen, form given to [place] in medical mgmt.  SLJ

## 2011-09-22 NOTE — Telephone Encounter (Signed)
Gave pt appt date for August 2013 lab and ML

## 2011-09-23 ENCOUNTER — Telehealth: Payer: Self-pay | Admitting: Medical Oncology

## 2011-09-23 DIAGNOSIS — C9000 Multiple myeloma not having achieved remission: Secondary | ICD-10-CM

## 2011-09-23 MED ORDER — POMALIDOMIDE 4 MG PO CAPS: 4.0000 mg | ORAL_CAPSULE | Freq: Every day | ORAL | Status: DC

## 2011-09-23 NOTE — Telephone Encounter (Signed)
refaxed rx for pomalyst 4 mg dose

## 2011-09-25 ENCOUNTER — Telehealth: Payer: Self-pay | Admitting: Medical Oncology

## 2011-09-25 NOTE — Telephone Encounter (Signed)
Pomalyst shipping confirmation

## 2011-10-05 ENCOUNTER — Other Ambulatory Visit: Payer: Self-pay | Admitting: Medical Oncology

## 2011-10-05 DIAGNOSIS — R52 Pain, unspecified: Secondary | ICD-10-CM

## 2011-10-05 MED ORDER — OXYCODONE HCL 40 MG PO TB12: 40.0000 mg | ORAL_TABLET | Freq: Two times a day (BID) | ORAL | Status: DC

## 2011-10-05 NOTE — Telephone Encounter (Signed)
Requests refill - done and left in injection room for pickup

## 2011-10-20 ENCOUNTER — Inpatient Hospital Stay (HOSPITAL_COMMUNITY)
Admission: EM | Admit: 2011-10-20 | Discharge: 2011-10-27 | DRG: 871 | Disposition: A | Discharge status: Home / Self Care | Payer: 59 | Attending: Internal Medicine | Admitting: Internal Medicine

## 2011-10-20 ENCOUNTER — Encounter: Payer: Self-pay | Admitting: Physician Assistant

## 2011-10-20 ENCOUNTER — Telehealth: Payer: Self-pay | Admitting: Internal Medicine

## 2011-10-20 ENCOUNTER — Other Ambulatory Visit (HOSPITAL_BASED_OUTPATIENT_CLINIC_OR_DEPARTMENT_OTHER): Payer: 59 | Admitting: Lab

## 2011-10-20 ENCOUNTER — Other Ambulatory Visit: Payer: Self-pay | Admitting: Medical Oncology

## 2011-10-20 ENCOUNTER — Ambulatory Visit (HOSPITAL_BASED_OUTPATIENT_CLINIC_OR_DEPARTMENT_OTHER): Payer: 59 | Admitting: Physician Assistant

## 2011-10-20 ENCOUNTER — Encounter (HOSPITAL_COMMUNITY): Payer: Self-pay | Admitting: Emergency Medicine

## 2011-10-20 VITALS — BP 136/80 | HR 73 | Temp 98.6°F | Resp 18 | Ht 66.0 in | Wt 193.7 lb

## 2011-10-20 DIAGNOSIS — E785 Hyperlipidemia, unspecified: Secondary | ICD-10-CM | POA: Diagnosis present

## 2011-10-20 DIAGNOSIS — I129 Hypertensive chronic kidney disease with stage 1 through stage 4 chronic kidney disease, or unspecified chronic kidney disease: Secondary | ICD-10-CM | POA: Diagnosis present

## 2011-10-20 DIAGNOSIS — J189 Pneumonia, unspecified organism: Secondary | ICD-10-CM | POA: Diagnosis present

## 2011-10-20 DIAGNOSIS — C9002 Multiple myeloma in relapse: Secondary | ICD-10-CM

## 2011-10-20 DIAGNOSIS — E1129 Type 2 diabetes mellitus with other diabetic kidney complication: Secondary | ICD-10-CM | POA: Diagnosis present

## 2011-10-20 DIAGNOSIS — N183 Chronic kidney disease, stage 3 unspecified: Secondary | ICD-10-CM | POA: Diagnosis present

## 2011-10-20 DIAGNOSIS — C9000 Multiple myeloma not having achieved remission: Secondary | ICD-10-CM | POA: Diagnosis present

## 2011-10-20 DIAGNOSIS — I10 Essential (primary) hypertension: Secondary | ICD-10-CM | POA: Diagnosis present

## 2011-10-20 DIAGNOSIS — E871 Hypo-osmolality and hyponatremia: Secondary | ICD-10-CM | POA: Diagnosis present

## 2011-10-20 DIAGNOSIS — B1920 Unspecified viral hepatitis C without hepatic coma: Secondary | ICD-10-CM | POA: Diagnosis present

## 2011-10-20 DIAGNOSIS — R339 Retention of urine, unspecified: Secondary | ICD-10-CM | POA: Diagnosis present

## 2011-10-20 DIAGNOSIS — D649 Anemia, unspecified: Secondary | ICD-10-CM | POA: Diagnosis present

## 2011-10-20 DIAGNOSIS — E119 Type 2 diabetes mellitus without complications: Secondary | ICD-10-CM | POA: Diagnosis present

## 2011-10-20 DIAGNOSIS — R4182 Altered mental status, unspecified: Secondary | ICD-10-CM | POA: Diagnosis present

## 2011-10-20 DIAGNOSIS — E236 Other disorders of pituitary gland: Secondary | ICD-10-CM | POA: Diagnosis present

## 2011-10-20 DIAGNOSIS — K5900 Constipation, unspecified: Secondary | ICD-10-CM | POA: Diagnosis present

## 2011-10-20 DIAGNOSIS — D709 Neutropenia, unspecified: Secondary | ICD-10-CM

## 2011-10-20 DIAGNOSIS — D630 Anemia in neoplastic disease: Secondary | ICD-10-CM | POA: Diagnosis present

## 2011-10-20 DIAGNOSIS — A419 Sepsis, unspecified organism: Principal | ICD-10-CM | POA: Diagnosis present

## 2011-10-20 HISTORY — DX: Chronic kidney disease, unspecified: N18.9

## 2011-10-20 LAB — COMPREHENSIVE METABOLIC PANEL (CC13)
AST: 10 U/L (ref 5–34)
Alkaline Phosphatase: 101 U/L (ref 40–150)
CO2: 22 mEq/L (ref 22–29)
Creatinine: 1.6 mg/dL — ABNORMAL HIGH (ref 0.7–1.3)
Sodium: 134 mEq/L — ABNORMAL LOW (ref 136–145)
Total Bilirubin: 1.1 mg/dL (ref 0.20–1.20)
Total Protein: 5.9 g/dL — ABNORMAL LOW (ref 6.4–8.3)

## 2011-10-20 LAB — CBC WITH DIFFERENTIAL/PLATELET
Basophils Absolute: 0 10*3/uL (ref 0.0–0.1)
Eosinophils Absolute: 0 10*3/uL (ref 0.0–0.5)
HCT: 33.7 % — ABNORMAL LOW (ref 38.4–49.9)
HGB: 11.4 g/dL — ABNORMAL LOW (ref 13.0–17.1)
LYMPH%: 24.5 % (ref 14.0–49.0)
MCV: 96 fL (ref 79.3–98.0)
MONO#: 0.5 10*3/uL (ref 0.1–0.9)
MONO%: 28.6 % — ABNORMAL HIGH (ref 0.0–14.0)
NEUT#: 0.7 10*3/uL — ABNORMAL LOW (ref 1.5–6.5)
NEUT%: 45.3 % (ref 39.0–75.0)
Platelets: 154 10*3/uL (ref 140–400)
RBC: 3.51 10*6/uL — ABNORMAL LOW (ref 4.20–5.82)
WBC: 1.6 10*3/uL — ABNORMAL LOW (ref 4.0–10.3)

## 2011-10-20 LAB — CORRECTED CALCIUM (CC13): Calcium, Corrected: 9.7 mg/dL (ref 8.4–10.4)

## 2011-10-20 MED ORDER — POMALIDOMIDE 4 MG PO CAPS: 4.0000 mg | ORAL_CAPSULE | Freq: Every day | ORAL | Status: DC

## 2011-10-20 NOTE — Telephone Encounter (Signed)
Received a call from Mrs. Harwood.  She is concerned about her husband.  Tonight his temperature has ranged from 101 to 102.  Reviewed his labs from today and noted that he was neutropenia with an ANC of 700.  I recommended that he come to the ED for further evaluation of neutropenic fever.  She voiced understanding.  Jill Alexanders 10/20/2011 9:39 PM

## 2011-10-20 NOTE — Patient Instructions (Addendum)
Take Neupogen as instructed for 3 days Return in 3 weeks for restaging protein studies Follow up with Dr. Arbutus Ped in 4 weeks to discuss the results of your restaging protein studies Follow up with your primary care physician regarding your blood sugars as your night sweats are likely related to your overall blood sugar control

## 2011-10-20 NOTE — ED Notes (Signed)
Family states pt is having pain in his right shoulder pain around his shoulder blade   Pt started to run a fever today as well  Pt is undergoing chemotherapy, last tx was yesterday  Pt has multiple myeloma and is currently under the care of Dr Gwenyth Bouillon

## 2011-10-20 NOTE — Telephone Encounter (Signed)
Gave pt appt calendar for September 2013 lab and MD °

## 2011-10-20 NOTE — Telephone Encounter (Signed)
Rx to Dr Donnald Garre for signature

## 2011-10-20 NOTE — Telephone Encounter (Signed)
Wife wanting to know when pt will see Dr Donnald Garre to discuss protein studies and question about Neupogen. Left message for pt to return my call  about both issues
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
## 2011-10-21 ENCOUNTER — Encounter (HOSPITAL_COMMUNITY): Payer: Self-pay | Admitting: Family Medicine

## 2011-10-21 ENCOUNTER — Emergency Department (HOSPITAL_COMMUNITY): Payer: 59

## 2011-10-21 ENCOUNTER — Inpatient Hospital Stay (HOSPITAL_COMMUNITY): Payer: 59

## 2011-10-21 DIAGNOSIS — D649 Anemia, unspecified: Secondary | ICD-10-CM | POA: Diagnosis present

## 2011-10-21 DIAGNOSIS — B1920 Unspecified viral hepatitis C without hepatic coma: Secondary | ICD-10-CM | POA: Diagnosis present

## 2011-10-21 DIAGNOSIS — E871 Hypo-osmolality and hyponatremia: Secondary | ICD-10-CM | POA: Diagnosis present

## 2011-10-21 DIAGNOSIS — J189 Pneumonia, unspecified organism: Secondary | ICD-10-CM | POA: Diagnosis present

## 2011-10-21 DIAGNOSIS — E785 Hyperlipidemia, unspecified: Secondary | ICD-10-CM | POA: Diagnosis present

## 2011-10-21 DIAGNOSIS — E1129 Type 2 diabetes mellitus with other diabetic kidney complication: Secondary | ICD-10-CM | POA: Diagnosis present

## 2011-10-21 DIAGNOSIS — I10 Essential (primary) hypertension: Secondary | ICD-10-CM | POA: Diagnosis present

## 2011-10-21 LAB — URINE MICROSCOPIC-ADD ON

## 2011-10-21 LAB — GLUCOSE, CAPILLARY
Glucose-Capillary: 154 mg/dL — ABNORMAL HIGH (ref 70–99)
Glucose-Capillary: 191 mg/dL — ABNORMAL HIGH (ref 70–99)

## 2011-10-21 LAB — CBC
HCT: 30.4 % — ABNORMAL LOW (ref 39.0–52.0)
MCV: 92.5 fL (ref 78.0–100.0)
MCV: 92.4 fL (ref 78.0–100.0)
Platelets: 165 10*3/uL (ref 150–400)
RBC: 3.29 MIL/uL — ABNORMAL LOW (ref 4.22–5.81)
RDW: 15.4 % (ref 11.5–15.5)
RDW: 15.3 % (ref 11.5–15.5)
WBC: 1.9 10*3/uL — ABNORMAL LOW (ref 4.0–10.5)
WBC: 1.7 10*3/uL — ABNORMAL LOW (ref 4.0–10.5)

## 2011-10-21 LAB — COMPREHENSIVE METABOLIC PANEL
ALT: 11 U/L (ref 0–53)
AST: 13 U/L (ref 0–37)
AST: 12 U/L (ref 0–37)
Albumin: 3.1 g/dL — ABNORMAL LOW (ref 3.5–5.2)
Alkaline Phosphatase: 84 U/L (ref 39–117)
CO2: 23 mEq/L (ref 19–32)
Calcium: 9.1 mg/dL (ref 8.4–10.5)
Calcium: 8.7 mg/dL (ref 8.4–10.5)
Chloride: 98 mEq/L (ref 96–112)
Chloride: 98 mEq/L (ref 96–112)
Creatinine, Ser: 1.42 mg/dL — ABNORMAL HIGH (ref 0.50–1.35)
GFR calc Af Amer: 56 mL/min — ABNORMAL LOW (ref >90)
GFR calc non Af Amer: 49 mL/min — ABNORMAL LOW (ref >90)
Glucose, Bld: 117 mg/dL — ABNORMAL HIGH (ref 70–99)
Potassium: 3.9 mEq/L (ref 3.5–5.1)
Sodium: 131 mEq/L — ABNORMAL LOW (ref 135–145)
Total Bilirubin: 0.6 mg/dL (ref 0.3–1.2)
Total Bilirubin: 0.7 mg/dL (ref 0.3–1.2)

## 2011-10-21 LAB — URINALYSIS, ROUTINE W REFLEX MICROSCOPIC
Glucose, UA: NEGATIVE mg/dL
Leukocytes, UA: NEGATIVE
Nitrite: NEGATIVE
Protein, ur: >300 mg/dL — AB

## 2011-10-21 LAB — LACTIC ACID, PLASMA: Lactic Acid, Venous: 0.8 mmol/L (ref 0.5–2.2)

## 2011-10-21 MED ORDER — ALUM & MAG HYDROXIDE-SIMETH 200-200-20 MG/5ML PO SUSP
30.0000 mL | Freq: Four times a day (QID) | ORAL | Status: DC | PRN
Start: 2011-10-21 — End: 2011-10-27

## 2011-10-21 MED ORDER — SIMVASTATIN 10 MG PO TABS
10.0000 mg | ORAL_TABLET | Freq: Every day | ORAL | Status: DC
  Administered 2011-10-21 – 2011-10-26 (×6): 10 mg via ORAL
  Filled 2011-10-21 (×7): qty 1

## 2011-10-21 MED ORDER — INSULIN ASPART 100 UNIT/ML ~~LOC~~ SOLN
0.0000 [IU] | Freq: Three times a day (TID) | SUBCUTANEOUS | Status: DC
  Administered 2011-10-21 – 2011-10-22 (×3): 3 [IU] via SUBCUTANEOUS
  Administered 2011-10-22: 5 [IU] via SUBCUTANEOUS
  Administered 2011-10-22: 3 [IU] via SUBCUTANEOUS
  Administered 2011-10-23: 5 [IU] via SUBCUTANEOUS

## 2011-10-21 MED ORDER — INSULIN GLARGINE 100 UNIT/ML ~~LOC~~ SOLN
64.0000 [IU] | Freq: Every day | SUBCUTANEOUS | Status: DC
  Administered 2011-10-21 – 2011-10-26 (×6): 64 [IU] via SUBCUTANEOUS

## 2011-10-21 MED ORDER — FILGRASTIM 480 MCG/1.6ML IJ SOLN
960.0000 ug | Freq: Every day | INTRAMUSCULAR | Status: DC
  Filled 2011-10-21: qty 3.2

## 2011-10-21 MED ORDER — ALBUTEROL SULFATE (5 MG/ML) 0.5% IN NEBU: 2.5000 mg | INHALATION_SOLUTION | RESPIRATORY_TRACT | Status: DC | PRN

## 2011-10-21 MED ORDER — ONDANSETRON HCL 4 MG PO TABS: 4.0000 mg | ORAL_TABLET | Freq: Four times a day (QID) | ORAL | Status: DC | PRN

## 2011-10-21 MED ORDER — ENSURE COMPLETE PO LIQD
237.0000 mL | Freq: Two times a day (BID) | ORAL | Status: DC
  Administered 2011-10-21 – 2011-10-22 (×2): 237 mL via ORAL

## 2011-10-21 MED ORDER — VANCOMYCIN HCL IN DEXTROSE 1-5 GM/200ML-% IV SOLN
1000.0000 mg | Freq: Once | INTRAVENOUS | Status: AC
  Administered 2011-10-21: 1000 mg via INTRAVENOUS
  Filled 2011-10-21: qty 200

## 2011-10-21 MED ORDER — ATENOLOL 100 MG PO TABS
100.0000 mg | ORAL_TABLET | Freq: Every day | ORAL | Status: DC
  Administered 2011-10-21 – 2011-10-27 (×7): 100 mg via ORAL
  Filled 2011-10-21 (×7): qty 1

## 2011-10-21 MED ORDER — BIOTENE DRY MOUTH MT LIQD
15.0000 mL | Freq: Two times a day (BID) | OROMUCOSAL | Status: DC
  Administered 2011-10-21 – 2011-10-24 (×5): 15 mL via OROMUCOSAL

## 2011-10-21 MED ORDER — SODIUM CHLORIDE 0.9 % IV SOLN
500.0000 mg | Freq: Three times a day (TID) | INTRAVENOUS | Status: DC
  Administered 2011-10-21 – 2011-10-27 (×19): 500 mg via INTRAVENOUS
  Filled 2011-10-21 (×21): qty 500

## 2011-10-21 MED ORDER — SODIUM CHLORIDE 0.9 % IJ SOLN: 3.0000 mL | INTRAMUSCULAR | Status: DC | PRN

## 2011-10-21 MED ORDER — ACETAMINOPHEN 325 MG PO TABS
650.0000 mg | ORAL_TABLET | Freq: Once | ORAL | Status: AC
  Administered 2011-10-21: 650 mg via ORAL
  Filled 2011-10-21: qty 2

## 2011-10-21 MED ORDER — TAMSULOSIN HCL 0.4 MG PO CAPS
0.4000 mg | ORAL_CAPSULE | Freq: Every day | ORAL | Status: DC
  Administered 2011-10-21 – 2011-10-26 (×6): 0.4 mg via ORAL
  Filled 2011-10-21 (×7): qty 1

## 2011-10-21 MED ORDER — AMLODIPINE BESYLATE 5 MG PO TABS
5.0000 mg | ORAL_TABLET | Freq: Every day | ORAL | Status: DC
  Administered 2011-10-21 – 2011-10-27 (×7): 5 mg via ORAL
  Filled 2011-10-21 (×7): qty 1

## 2011-10-21 MED ORDER — ASPIRIN 81 MG PO CHEW
81.0000 mg | CHEWABLE_TABLET | Freq: Every day | ORAL | Status: DC
  Administered 2011-10-21 – 2011-10-27 (×7): 81 mg via ORAL
  Filled 2011-10-21 (×7): qty 1

## 2011-10-21 MED ORDER — HYDROCHLOROTHIAZIDE 12.5 MG PO CAPS
12.5000 mg | ORAL_CAPSULE | Freq: Every day | ORAL | Status: DC
  Administered 2011-10-21 – 2011-10-24 (×4): 12.5 mg via ORAL
  Filled 2011-10-21 (×4): qty 1

## 2011-10-21 MED ORDER — IRBESARTAN 150 MG PO TABS
150.0000 mg | ORAL_TABLET | Freq: Every day | ORAL | Status: DC
  Administered 2011-10-21 – 2011-10-24 (×4): 150 mg via ORAL
  Filled 2011-10-21 (×4): qty 1

## 2011-10-21 MED ORDER — SODIUM CHLORIDE 0.9 % IJ SOLN
3.0000 mL | Freq: Two times a day (BID) | INTRAMUSCULAR | Status: DC
  Administered 2011-10-24 – 2011-10-26 (×3): 3 mL via INTRAVENOUS

## 2011-10-21 MED ORDER — INSULIN ASPART PROT & ASPART (70-30 MIX) 100 UNIT/ML ~~LOC~~ SUSP
2.0000 [IU] | SUBCUTANEOUS | Status: DC
  Filled 2011-10-21: qty 3

## 2011-10-21 MED ORDER — VALSARTAN-HYDROCHLOROTHIAZIDE 160-12.5 MG PO TABS
1.0000 | ORAL_TABLET | Freq: Every day | ORAL | Status: DC
Start: 2011-10-21 — End: 2011-10-21

## 2011-10-21 MED ORDER — ONDANSETRON HCL 4 MG/2ML IJ SOLN
4.0000 mg | Freq: Four times a day (QID) | INTRAMUSCULAR | Status: DC | PRN
  Administered 2011-10-22: 4 mg via INTRAVENOUS
  Filled 2011-10-21: qty 2

## 2011-10-21 MED ORDER — PIPERACILLIN-TAZOBACTAM 3.375 G IVPB
3.3750 g | Freq: Once | INTRAVENOUS | Status: DC
  Administered 2011-10-21: 3.375 g via INTRAVENOUS
  Filled 2011-10-21: qty 50

## 2011-10-21 MED ORDER — DOCUSATE SODIUM 100 MG PO CAPS: 200.0000 mg | ORAL_CAPSULE | Freq: Two times a day (BID) | ORAL | Status: DC

## 2011-10-21 MED ORDER — DOCUSATE SODIUM 100 MG PO CAPS
300.0000 mg | ORAL_CAPSULE | Freq: Every day | ORAL | Status: DC
  Administered 2011-10-21 – 2011-10-26 (×6): 300 mg via ORAL
  Filled 2011-10-21 (×7): qty 3

## 2011-10-21 MED ORDER — IPRATROPIUM BROMIDE 0.02 % IN SOLN: 0.5000 mg | RESPIRATORY_TRACT | Status: DC | PRN

## 2011-10-21 MED ORDER — ACETAMINOPHEN 325 MG PO TABS
650.0000 mg | ORAL_TABLET | Freq: Four times a day (QID) | ORAL | Status: DC | PRN
  Administered 2011-10-21 – 2011-10-26 (×8): 650 mg via ORAL
  Filled 2011-10-21 (×10): qty 2

## 2011-10-21 MED ORDER — INSULIN ASPART 100 UNIT/ML ~~LOC~~ SOLN: 0.0000 [IU] | Freq: Every day | SUBCUTANEOUS | Status: DC

## 2011-10-21 MED ORDER — FILGRASTIM 480 MCG/1.6ML IJ SOLN
480.0000 ug | Freq: Every day | INTRAMUSCULAR | Status: AC
  Administered 2011-10-21 – 2011-10-24 (×4): 480 ug via SUBCUTANEOUS
  Filled 2011-10-21 (×4): qty 1.6

## 2011-10-21 MED ORDER — DOCUSATE SODIUM 100 MG PO CAPS
200.0000 mg | ORAL_CAPSULE | Freq: Every day | ORAL | Status: DC
  Administered 2011-10-21 – 2011-10-27 (×7): 200 mg via ORAL
  Filled 2011-10-21 (×7): qty 2

## 2011-10-21 MED ORDER — SODIUM CHLORIDE 0.9 % IV SOLN
250.0000 mL | INTRAVENOUS | Status: DC | PRN
  Administered 2011-10-23: 250 mL via INTRAVENOUS

## 2011-10-21 MED ORDER — BISACODYL 5 MG PO TBEC
5.0000 mg | DELAYED_RELEASE_TABLET | Freq: Every day | ORAL | Status: DC
  Administered 2011-10-22 – 2011-10-27 (×6): 5 mg via ORAL
  Filled 2011-10-21 (×6): qty 1

## 2011-10-21 MED ORDER — OXYCODONE HCL 40 MG PO TB12
40.0000 mg | ORAL_TABLET | Freq: Two times a day (BID) | ORAL | Status: DC
  Administered 2011-10-21 – 2011-10-27 (×14): 40 mg via ORAL
  Filled 2011-10-21 (×7): qty 1
  Filled 2011-10-21: qty 4
  Filled 2011-10-21 (×6): qty 1

## 2011-10-21 MED ORDER — DEXAMETHASONE 6 MG PO TABS: 40.0000 mg | ORAL_TABLET | ORAL | Status: DC

## 2011-10-21 MED ORDER — CHLORHEXIDINE GLUCONATE 0.12 % MT SOLN
15.0000 mL | Freq: Two times a day (BID) | OROMUCOSAL | Status: DC
  Administered 2011-10-21 – 2011-10-26 (×9): 15 mL via OROMUCOSAL
  Filled 2011-10-21 (×15): qty 15

## 2011-10-21 MED ORDER — HYDROMORPHONE HCL PF 2 MG/ML IJ SOLN
2.0000 mg | Freq: Once | INTRAMUSCULAR | Status: AC
  Administered 2011-10-21: 2 mg via INTRAVENOUS
  Filled 2011-10-21: qty 1

## 2011-10-21 NOTE — Progress Notes (Signed)
[facility] PROGRESS NOTE  Rodney Green ZOX:[id number] DOB: 06-01-1945 DOA: 10/20/2011 PCP: Katy Apo, MD  Assessment/Plan: Principal Problem:  *Community acquired pneumonia  Admitted and put on broad-spectrum antibiotics consisting of imipenem and vancomycin.  Followup blood cultures. 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 Mild. Monitor.  Multiple myeloma in relapse / normocytic anemia  Dr. Arbutus Ped saw the patient and initiated Neupogen therapy for his neutropenia.  Pomalyst on hold.  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 Patient currently on Neupogen. Pomalyst on hold.  Diabetes mellitus, type 2  Continue Lantus 64 units daily and 70/30 insulin per home regimen.  Hypertension  Continue Norvasc, HCTZ, Avapro and atenolol.  Hepatitis C  Liver function studies within normal limits.  CKD (chronic kidney disease) stage 3, GFR 30-59 ml/min  Creatinine stable, at usual baseline values.  Hyperlipidemia  Continue statin.   Code Status: Full code. Family Communication: None at bedside. Disposition Plan: Home when stable.   Brief narrative: Rodney Green is a 66 year old man with a PMH of relapsed multiple myeloma who was admitted 10/21/2011 with community-acquired pneumonia in the setting of recent chemotherapy and neutropenia. He was put on broad-spectrum antibiotics. Neupogen therapy has also been initiated.  Medical Consultants:  Dr. Si Gaul, Oncology.  Other Consultants:  None.  Procedures:  None.  Antibiotics:  Imipenem 10/20/2011--->  Vancomycin 10/20/2011--->  HPI/Subjective: Rodney Green is somewhat somnolent. He denies chest pain or dyspnea. No current cough. Continues to have fevers.  Objective: Filed Vitals:   10/20/11 2223 10/21/11 0327 10/21/11 0431  BP: 161/89 158/98 142/82  Pulse: 81 87 86  Temp: 99.5 F (37.5 C) 102.9 F (39.4 C) 102.4 F (39.1 C)    TempSrc: Oral Oral Oral  Resp: 24 18 19   Height: 5\' 7"  (1.702 m)  5\' 7"  (1.702 m)  Weight: 87.544 kg (193 lb)  86.6 kg (190 lb 14.7 oz)  SpO2: 97% 93% 99%   No intake or output data in the 24 hours ending 10/21/11 1055  Exam: Gen:  NAD, somnolent. Cardiovascular:  RRR, No M/R/G Respiratory: Lungs CTAB Gastrointestinal: Abdomen soft, NT/ND with normal active bowel sounds. Extremities: No C/E/C  Data Reviewed: Basic Metabolic Panel:  Lab 10/21/11 8119 10/21/11 0035 10/20/11 1012  NA 131* 130* 134*  K 3.9 4.0 --  CL 98 98 101  CO2 23 19 22   GLUCOSE 117* 122* 238*  BUN 29* 30* 32.0*  CREATININE 1.48* 1.42* 1.6*  CALCIUM 8.7 9.1 --  MG -- -- --  PHOS -- -- --   GFR Estimated Creatinine Clearance: 53.7 ml/min (by C-G formula based on Cr of 1.48). Liver Function Tests:  Lab 10/21/11 0528 10/21/11 0035 10/20/11 1012  AST 12 13 10   ALT 11 12 13   ALKPHOS 84 95 101  BILITOT 0.7 0.6 1.10  PROT 5.9* 6.4 5.9*  ALBUMIN 2.8* 3.1* 3.0*    CBC:  Lab 10/21/11 0528 10/21/11 0035 10/20/11 1012  WBC 1.7* 1.9* 1.6*  NEUTROABS -- -- 0.7*  HGB 10.3* 11.3* 11.4*  HCT 30.4* 33.3* 33.7*  MCV 92.4 92.5 96.0  PLT 164 165 154   CBG:  Lab 10/21/11 0819  GLUCAP 89   Microbiology No results found for this or any previous visit (from the past 240 hour(s)).   Studies:  Dg Chest 2 View 10/21/2011  IMPRESSION:  1.  Streaky right basilar airspace opacity could be due to atelectasis or pneumonia. 2.  Findings consistent with multiple myeloma.  Original Report Authenticated By: Bernadene Bell. D'ALESSIO, M.D.     Scheduled Meds:    . acetaminophen  650 mg Oral Once  . amLODipine  5 mg Oral Daily  . antiseptic oral rinse  15 mL Mouth Rinse q12n4p  . aspirin  81 mg Oral Daily  . atenolol  100 mg Oral Daily  . chlorhexidine  15 mL Mouth Rinse BID  . dexamethasone  40 mg Oral Weekly  . docusate sodium  200 mg Oral Daily  . docusate sodium  300 mg Oral QHS  . filgrastim  480 mcg  Subcutaneous Daily  . hydrochlorothiazide  12.5 mg Oral Daily   And  . irbesartan  150 mg Oral Daily  .  HYDROmorphone (DILAUDID) injection  2 mg Intravenous Once  . imipenem-cilastatin  500 mg Intravenous Q8H  . insulin aspart  0-15 Units Subcutaneous TID WC  . insulin aspart  0-5 Units Subcutaneous QHS  . insulin aspart protamine-insulin aspart  2-28 Units Subcutaneous Weekly  . insulin glargine  64 Units Subcutaneous QHS  . oxyCODONE  40 mg Oral Q12H  . simvastatin  10 mg Oral QHS  . sodium chloride  3 mL Intravenous Q12H  . vancomycin  1,000 mg Intravenous Once  . DISCONTD: docusate sodium  200-300 mg Oral BID  . DISCONTD: filgrastim  960 mcg Subcutaneous Daily  . DISCONTD: piperacillin-tazobactam (ZOSYN)  IV  3.375 g Intravenous Once  . DISCONTD: valsartan-hydrochlorothiazide  1 tablet Oral Daily   Continuous Infusions:   Time spent: 35 minutes.   LOS: 1 day   Kimmerly Lora  [facility] Pager [phone].  If 8PM-8AM, please contact night-coverage at [email or web address], password [facility] 10/21/2011, 10:55 AM              pr

## 2011-10-21 NOTE — H&P (Signed)
PCP:   Katy Apo, MD  Oncologists: Dr. Gwenyth Bouillon  Chief Complaint:  Chills and fever  HPI: This is a 66 year old gentleman with history of multiple myeloma diagnosed in 2009. He is now experiencing a relapse and just completed the course of chemotherapy. Two days ago he developed pain in his right shoulder blade. Since then he's been getting weaker, his appetite has been poor. Yesterday he had sweats, today he developed severe chills. He additionally developed a temperature of 101.7. He has chronic shortness of breath the he states there has been no change. He denies any wheezing or any cough or any altered mentation. He denies any burning or urination. With a temperature of 101 they call the oncologist on-call who recommended he come to the ER.   Review of Systems:  The patient denies anorexia, fever, weight loss,, vision loss, decreased hearing, hoarseness, chest pain, syncope, dyspnea on exertion, peripheral edema, balance deficits, hemoptysis, abdominal pain, melena, hematochezia, severe indigestion/heartburn, hematuria, incontinence, genital sores, muscle weakness, suspicious skin lesions, transient blindness, difficulty walking, depression, unusual weight change, abnormal bleeding, enlarged lymph nodes, angioedema, and breast masses.  Past Medical History: Past Medical History  Diagnosis Date  . Allergy   . Diabetes mellitus   . Hypertension   . Hepatitis C   . Multiple myeloma   . Chronic kidney disease    Past Surgical History  Procedure Date  . Circumcision   . Hand surgery     Medications: Prior to Admission medications   Medication Sig Start Date End Date Taking? Authorizing Provider  amLODipine (NORVASC) 5 MG tablet Take 5 mg by mouth daily.     Yes Historical Provider, MD  aspirin 81 MG tablet Take 81 mg by mouth daily.   Yes Historical Provider, MD  atenolol (TENORMIN) 100 MG tablet Take 100 mg by mouth daily.     Yes Historical Provider, MD  dexamethasone  (DECADRON) 4 MG tablet Take 40 mg by mouth once a week. On tuesdays   Yes Historical Provider, MD  dexamethasone (DECADRON) 4 MG tablet Take 40 mg by mouth once a week. On Tuesday nights 08/25/11 10/20/11 Yes Si Gaul, MD  docusate sodium (COLACE) 100 MG capsule Take 200-300 mg by mouth 2 (two) times daily. Takes 2 capsules(200mg ) every morning and 3 capsules(300mg ) every night   Yes Historical Provider, MD  filgrastim (NEUPOGEN) 480 MCG/0.8ML SOLN injection Inject 960 mcg into the skin daily. Pt doing self injections and is to continue until he returns to [employer] when WBC > 3000 03/02/11  Yes Historical Provider, MD  insulin glargine (LANTUS) 100 UNIT/ML injection Inject 64 Units into the skin at bedtime.    Yes Historical Provider, MD  NOVOLOG MIX 70/30 (70-30) 100 UNIT/ML injection Inject 2-28 Units into the skin once a week. Per sliding scale.Marland KitchenMarland KitchenOnly takes morning after taking dexamethasone 10/08/11  Yes Historical Provider, MD  oxyCODONE (OXYCONTIN) 40 MG 12 hr tablet Take 1 tablet (40 mg total) by mouth every 12 (twelve) hours. 10/05/11  Yes Si Gaul, MD  pomalidomide (POMALYST) 4 MG capsule Take 1 capsule (4 mg total) by mouth daily. 10/20/11  Yes Si Gaul, MD  simvastatin (ZOCOR) 10 MG tablet Take 10 mg by mouth at bedtime.     Yes Historical Provider, MD  valsartan-hydrochlorothiazide (DIOVAN-HCT) 160-12.5 MG per tablet Take 1 tablet by mouth daily.     Yes Historical Provider, MD    Allergies:   Allergies  Allergen Reactions  . Ace Inhibitors     He  is not sure about allergy to ace inhibitors    Social History:  reports that he quit smoking about 3 years ago. His smoking use included Cigarettes. He has a 22 pack-year smoking history. He has never used smokeless tobacco. He reports that he drinks alcohol. He reports that he does not use illicit drugs.  Family History: Family History  Problem Relation Age of Onset  . Cancer Sister   . Diabetes Other   . Hypertension Other      Physical Exam: Filed Vitals:   10/20/11 2223  BP: 161/89  Pulse: 81  Temp: 99.5 F (37.5 C)  TempSrc: Oral  Resp: 24  Height: 5\' 7"  (1.702 m)  Weight: 87.544 kg (193 lb)  SpO2: 97%    General:  Alert and oriented times three, well developed and nourished, no acute distress Eyes: PERRLA, pink conjunctiva, no scleral icterus ENT: Moist oral mucosa, neck supple, no thyromegaly Lungs: clear to ascultation, no wheeze, no crackles, no use of accessory muscles Cardiovascular: regular rate and rhythm, no regurgitation, no gallops, no murmurs. No carotid bruits, no JVD Abdomen: soft, positive BS, non-tender, non-distended, no organomegaly, not an acute abdomen GU: not examined Neuro: CN II - XII grossly intact, sensation intact Musculoskeletal: strength 5/5 all extremities, no clubbing, cyanosis or edema Skin: no rash, no subcutaneous crepitation, no decubitus Psych: appropriate patient   Labs on Admission:   Basename 10/21/11 0035 10/20/11 1012  NA 130* 134*  K 4.0 3.9  CL 98 101  CO2 19 22  GLUCOSE 122* 238*  BUN 30* 32.0*  CREATININE 1.42* 1.6*  CALCIUM 9.1 --  MG -- --  PHOS -- --    Basename 10/21/11 0035 10/20/11 1012  AST 13 10  ALT 12 13  ALKPHOS 95 101  BILITOT 0.6 1.10  PROT 6.4 5.9*  ALBUMIN 3.1* 3.0*   No results found for this basename: LIPASE:2,AMYLASE:2 in the last 72 hours  Basename 10/21/11 0035 10/20/11 1012  WBC 1.9* 1.6*  NEUTROABS -- 0.7*  HGB 11.3* 11.4*  HCT 33.3* 33.7*  MCV 92.5 96.0  PLT 165 154   No results found for this basename: CKTOTAL:3,CKMB:3,CKMBINDEX:3,TROPONINI:3 in the last 72 hours No components found with this basename: POCBNP:3 No results found for this basename: DDIMER:2 in the last 72 hours No results found for this basename: HGBA1C:2 in the last 72 hours No results found for this basename: CHOL:2,HDL:2,LDLCALC:2,TRIG:2,CHOLHDL:2,LDLDIRECT:2 in the last 72 hours No results found for this basename:  TSH,T4TOTAL,FREET3,T3FREE,THYROIDAB in the last 72 hours No results found for this basename: VITAMINB12:2,FOLATE:2,FERRITIN:2,TIBC:2,IRON:2,RETICCTPCT:2 in the last 72 hours  Micro Results: Ordered, awaiting collection   Radiological Exams on Admission: Dg Chest 2 View  10/21/2011  *RADIOLOGY REPORT*  Clinical Data: Fever.  Multiple myeloma.  CHEST - 2 VIEW  Comparison: Plain film of the chest 12/31/2010.  Findings: Streaky opacity is present in the right lung base.  Left lung is clear.  No pneumothorax or effusion.  Heart size is normal. Multiple thoracic and lumbar compression fracture deformities are seen.  Punctate lytic lesions are present in the clavicles, proximal humeri and scapulae consistent history multiple myeloma. Remote right rib fractures are also noted.  IMPRESSION:  1.  Streaky right basilar airspace opacity could be due to atelectasis or pneumonia. 2.  Findings consistent with multiple myeloma.   Original Report Authenticated By: Bernadene Bell. Maricela Curet, M.D.     Assessment/Plan Present on Admission:  .Community acquired pneumonia Immunocompromise state Admit to [employer] antibiotics imipenem and vancomycin Blood  cultures and urine cultures collected DuoNeb when necessary  .Multiple myeloma  Neutropenia Just completed 21 days cycle of chemotherapy Consult Dr. Jerolyn Center in a.m. if needed Continue Neupogen Patient's chemotherapeutic agents have NOT been discontinued, defer to a.m. team and oncologists Diabetes mellitus Hypertension Chronic kidney disease Hepatitis C resume home medications ADA diet and sliding scale insulin    Full code DVT prophylaxis    Jennaya Pogue 10/21/2011, 2:55 AM

## 2011-10-21 NOTE — ED Provider Notes (Signed)
History     CSN: [id number]  Arrival date & time 10/20/11  2220   First MD Initiated Contact with Patient 10/20/11 2359      Chief Complaint  Patient presents with  . Fever    (Consider location/radiation/quality/duration/timing/severity/associated sxs/prior treatment) HPI Comments: [name] is a 63 rhythm with a history of recurrent multiple myeloma, who comes in tonight with excruciating right shoulder pain, shortness of breath, fever.  He called his oncologist, who instructed his wife to bring him to the emergency room for further evaluation.  He was unable to take his normal nighttime dose of OxyContin to to his pain and nausea  The history is provided by the patient.    Past Medical History  Diagnosis Date  . Allergy   . Diabetes mellitus   . Hypertension   . Hepatitis C   . Multiple myeloma     Past Surgical History  Procedure Date  . Circumcision   . Hand surgery     Family History  Problem Relation Age of Onset  . Cancer Sister   . Diabetes Other   . Hypertension Other     History  Substance Use Topics  . Smoking status: Former Smoker -- 0.5 packs/day for 44 years    Types: Cigarettes    Quit date: 01/24/2008  . Smokeless tobacco: Never Used  . Alcohol Use: Yes     occ      Review of Systems  Constitutional: Positive for fever and chills.  Respiratory: Positive for shortness of breath.   Cardiovascular: Negative for chest pain and leg swelling.  Gastrointestinal: Positive for nausea.  Musculoskeletal: Positive for back pain.  Neurological: Positive for weakness.    Allergies  Ace inhibitors  Home Medications   Current Outpatient Rx  Name Route Sig Dispense Refill  . AMLODIPINE BESYLATE 5 MG PO TABS Oral Take 5 mg by mouth daily.      . ASPIRIN 81 MG PO TABS Oral Take 81 mg by mouth daily.    . ATENOLOL 100 MG PO TABS Oral Take 100 mg by mouth daily.      Marland Kitchen DEXAMETHASONE 4 MG PO TABS Oral Take 40 mg by mouth once a week. On tuesdays      . DEXAMETHASONE 4 MG PO TABS Oral Take 40 mg by mouth once a week. On Tuesday nights    . DOCUSATE SODIUM 100 MG PO CAPS Oral Take 200-300 mg by mouth 2 (two) times daily. Takes 2 capsules(200mg ) every morning and 3 capsules(300mg ) every night    . FILGRASTIM 480 MCG/0.8ML IJ SOLN Subcutaneous Inject 960 mcg into the skin daily. Pt doing self injections and is to continue until he returns to [employer] when WBC > 3000    . INSULIN GLARGINE 100 UNIT/ML [place] SOLN Subcutaneous Inject 64 Units into the skin at bedtime.     Marland Kitchen NOVOLOG MIX 70/30 (70-30) 100 UNIT/ML [place] SUSP Subcutaneous Inject 2-28 Units into the skin once a week. Per sliding scale.Marland KitchenMarland KitchenOnly takes morning after taking dexamethasone    . OXYCODONE HCL ER 40 MG PO TB12 Oral Take 1 tablet (40 mg total) by mouth every 12 (twelve) hours. 60 tablet 0  . POMALIDOMIDE 4 MG PO CAPS Oral Take 1 capsule (4 mg total) by mouth daily. 21 capsule 0    Auth number [id number] Dr Donnald Garre for approval  . SIMVASTATIN 10 MG PO TABS Oral Take 10 mg by mouth at bedtime.      Marland Kitchen VALSARTAN-HYDROCHLOROTHIAZIDE 160-12.5 MG  PO TABS Oral Take 1 tablet by mouth daily.        BP 161/89  Pulse 81  Temp 99.5 F (37.5 C) (Oral)  Resp 24  Ht 5\' 7"  (1.702 m)  Wt 193 lb (87.544 kg)  BMI 30.23 kg/m2  SpO2 97%  Physical Exam  Constitutional: He appears well-developed and well-nourished.  HENT:  Head: Normocephalic.  Eyes: Pupils are equal, round, and reactive to light.  Neck: Normal range of motion.  Cardiovascular: Normal rate.   Pulmonary/Chest: Effort normal.  Abdominal: Soft.  Musculoskeletal: Normal range of motion.  Neurological: He is alert.  Skin: Skin is warm.    ED Course  Procedures (including critical care time)  Labs Reviewed  CBC - Abnormal; Notable for the following:    WBC 1.9 (*)     RBC 3.60 (*)     Hemoglobin 11.3 (*)     HCT 33.3 (*)     All other components within normal limits  COMPREHENSIVE METABOLIC PANEL - Abnormal; Notable for the  following:    Sodium 130 (*)     Glucose, Bld 122 (*)     BUN 30 (*)     Creatinine, Ser 1.42 (*)     Albumin 3.1 (*)     GFR calc non Af Amer 51 (*)     GFR calc Af Amer 59 (*)     All other components within normal limits  CULTURE, BLOOD (ROUTINE X 2)  CULTURE, BLOOD (ROUTINE X 2)  URINALYSIS, ROUTINE W REFLEX MICROSCOPIC  LACTIC ACID, PLASMA  URINE CULTURE   Dg Chest 2 View  10/21/2011  *RADIOLOGY REPORT*  Clinical Data: Fever.  Multiple myeloma.  CHEST - 2 VIEW  Comparison: Plain film of the chest 12/31/2010.  Findings: Streaky opacity is present in the right lung base.  Left lung is clear.  No pneumothorax or effusion.  Heart size is normal. Multiple thoracic and lumbar compression fracture deformities are seen.  Punctate lytic lesions are present in the clavicles, proximal humeri and scapulae consistent history multiple myeloma. Remote right rib fractures are also noted.  IMPRESSION:  1.  Streaky right basilar airspace opacity could be due to atelectasis or pneumonia. 2.  Findings consistent with multiple myeloma.   Original Report Authenticated By: Bernadene Bell. D'ALESSIO, M.D.      1. Healthcare-associated pneumonia   2. Neutropenia       MDM   Concern for worsening CA verses pneumonia         Arman Filter, NP 10/21/11 0215  Arman Filter, NP 10/21/11 [id number]

## 2011-10-21 NOTE — ED Provider Notes (Addendum)
Medical screening examination/treatment/procedure(s) were conducted as a shared visit with non-physician practitioner(s) and myself.  I personally evaluated the patient during the encounter  The patient be treated for healthcare associated pneumonia.  His pain is improved.  Patient be admitted the hospital.  1. Healthcare-associated pneumonia   2. Neutropenia   3. Multiple myeloma     Dg Chest 2 View  10/21/2011  *RADIOLOGY REPORT*  Clinical Data: Fever.  Multiple myeloma.  CHEST - 2 VIEW  Comparison: Plain film of the chest 12/31/2010.  Findings: Streaky opacity is present in the right lung base.  Left lung is clear.  No pneumothorax or effusion.  Heart size is normal. Multiple thoracic and lumbar compression fracture deformities are seen.  Punctate lytic lesions are present in the clavicles, proximal humeri and scapulae consistent history multiple myeloma. Remote right rib fractures are also noted.  IMPRESSION:  1.  Streaky right basilar airspace opacity could be due to atelectasis or pneumonia. 2.  Findings consistent with multiple myeloma.   Original Report Authenticated By: Bernadene Bell. Maricela Curet, M.D.    I personally reviewed the imaging tests through PACS system  I reviewed available ER/hospitalization records thought the EMR    Lyanne Co, MD 10/21/11 8119  Lyanne Co, MD 10/21/11 [phone]

## 2011-10-21 NOTE — Progress Notes (Signed)
Bladder scan shows greater than 1000 cc of urine. Attempted to I&O catheterize x2 but met with resistance. Unable to get urine output during catheterization. Md notified.

## 2011-10-21 NOTE — ED Notes (Signed)
Called to give report nurse unavailable will call back.  

## 2011-10-21 NOTE — Progress Notes (Signed)
INITIAL ADULT NUTRITION ASSESSMENT Date: 10/21/2011   Time: 1:34 PM Reason for Assessment: Nutrition risk, consult   INTERVENTION: Chocolate Ensure Complete BID. Will monitor intake.   ASSESSMENT: Male 66 y.o.  Dx: Community acquired pneumonia  Food/Nutrition Related Hx: Pt with relapsed multiple myeloma s/p chemotherapy. Pt asleep during visit, wife present at bedside. She reports pt eating 3 meals/day PTA and drinking a nutritional supplement similar to Ensure on/off when his appetite goes down. She denies pt with any taste changes. She reports pt is not good about checking his blood sugars and states they went up to 596 mg/dL on Saturday r/t pt eating cake and ice cream at his grandson's birthday party. She states she thinks pt may have lost 10 pounds in the past 2-3 months.   Hx:  Past Medical History  Diagnosis Date  . Allergy   . Diabetes mellitus   . Hypertension   . Hepatitis C   . Multiple myeloma   . Chronic kidney disease    Related Meds:  Scheduled Meds:   . acetaminophen  650 mg Oral Once  . amLODipine  5 mg Oral Daily  . antiseptic oral rinse  15 mL Mouth Rinse q12n4p  . aspirin  81 mg Oral Daily  . atenolol  100 mg Oral Daily  . chlorhexidine  15 mL Mouth Rinse BID  . dexamethasone  40 mg Oral Weekly  . docusate sodium  200 mg Oral Daily  . docusate sodium  300 mg Oral QHS  . filgrastim  480 mcg Subcutaneous Daily  . hydrochlorothiazide  12.5 mg Oral Daily   And  . irbesartan  150 mg Oral Daily  .  HYDROmorphone (DILAUDID) injection  2 mg Intravenous Once  . imipenem-cilastatin  500 mg Intravenous Q8H  . insulin aspart  0-15 Units Subcutaneous TID WC  . insulin aspart  0-5 Units Subcutaneous QHS  . insulin aspart protamine-insulin aspart  2-28 Units Subcutaneous Weekly  . insulin glargine  64 Units Subcutaneous QHS  . oxyCODONE  40 mg Oral Q12H  . simvastatin  10 mg Oral QHS  . sodium chloride  3 mL Intravenous Q12H  . vancomycin  1,000 mg Intravenous  Once  . DISCONTD: docusate sodium  200-300 mg Oral BID  . DISCONTD: filgrastim  960 mcg Subcutaneous Daily  . DISCONTD: piperacillin-tazobactam (ZOSYN)  IV  3.375 g Intravenous Once  . DISCONTD: valsartan-hydrochlorothiazide  1 tablet Oral Daily   Continuous Infusions:  PRN Meds:.sodium chloride, albuterol, alum & mag hydroxide-simeth, ipratropium, ondansetron (ZOFRAN) IV, ondansetron, sodium chloride  Ht: 5\' 7"  (170.2 cm)  Wt: 190 lb 14.7 oz (86.6 kg)  Ideal Wt: 148 lb % Ideal Wt: 128  Usual Wt: 187-203 lb in the past year  % Usual Wt: 100  Wt Readings from Last 10 Encounters:  10/21/11 190 lb 14.7 oz (86.6 kg)  10/20/11 193 lb 11.2 oz (87.862 kg)  09/22/11 192 lb 3.2 oz (87.181 kg)  08/25/11 187 lb 9.6 oz (85.095 kg)  08/11/11 194 lb (87.998 kg)  07/28/11 196 lb 6.4 oz (89.086 kg)  07/14/11 197 lb (89.359 kg)  06/23/11 203 lb 1.6 oz (92.126 kg)  03/02/11 198 lb 4.8 oz (89.948 kg)  12/30/10 202 lb 8 oz (91.853 kg)     Body mass index is 29.90 kg/(m^2).   Labs:  CMP     Component Value Date/Time   NA 131* 10/21/2011 0528   NA 134* 10/20/2011 1012   K 3.9 10/21/2011 0528   K  3.9 10/20/2011 1012   CL 98 10/21/2011 0528   CL 101 10/20/2011 1012   CO2 23 10/21/2011 0528   CO2 22 10/20/2011 1012   GLUCOSE 117* 10/21/2011 0528   GLUCOSE 238* 10/20/2011 1012   BUN 29* 10/21/2011 0528   BUN 32.0* 10/20/2011 1012   CREATININE 1.48* 10/21/2011 0528   CREATININE 1.6* 10/20/2011 1012   CALCIUM 8.7 10/21/2011 0528   PROT 5.9* 10/21/2011 0528   PROT 5.9* 10/20/2011 1012   ALBUMIN 2.8* 10/21/2011 0528   ALBUMIN 3.0* 10/20/2011 1012   AST 12 10/21/2011 0528   AST 10 10/20/2011 1012   ALT 11 10/21/2011 0528   ALT 13 10/20/2011 1012   ALKPHOS 84 10/21/2011 0528   ALKPHOS 101 10/20/2011 1012   BILITOT 0.7 10/21/2011 0528   BILITOT 1.10 10/20/2011 1012   GFRNONAA 49* 10/21/2011 0528   GFRAA 56* 10/21/2011 0528   Lab Results  Component Value Date   HGBA1C  Value: 7.1 (NOTE)   The ADA recommends  the following therapeutic goal for glycemic   control related to Hgb A1C measurement:   Goal of Therapy:   < 7.0% Hgb A1C   Reference: American Diabetes Association: Clinical Practice   Recommendations 2008, Diabetes Care,  2008, 31:(Suppl 1).* 01/14/2008   CBG (last 3)   Basename 10/21/11 1226 10/21/11 0819  GLUCAP 154* 89    Intake/Output Summary (Last 24 hours) at 10/21/11 1341 Last data filed at 10/21/11 1100  Gross per 24 hour  Intake    240 ml  Output    600 ml  Net   -360 ml   Last BM - 8/27  Diet Order: Carb Control   IVF:    Estimated Nutritional Needs:   Kcal:1700-2100 Protein:80-100g Fluid:1.7-2.1L  NUTRITION DIAGNOSIS: -Increased nutrient needs (NI-5.1).  Status: Ongoing  RELATED TO: multiple myeloma recently on chemotherapy  AS EVIDENCE BY: H&P  MONITORING/EVALUATION(Goals): Pt to consume >90% of meals/supplements.   EDUCATION NEEDS: -No education needs identified at this time   Dietitian #: [phone]  DOCUMENTATION CODES Per approved criteria  -Not Applicable    Marshall Cork 10/21/2011, 1:34 PM

## 2011-10-22 ENCOUNTER — Other Ambulatory Visit: Payer: Self-pay | Admitting: Medical Oncology

## 2011-10-22 DIAGNOSIS — R339 Retention of urine, unspecified: Secondary | ICD-10-CM | POA: Diagnosis present

## 2011-10-22 DIAGNOSIS — R4182 Altered mental status, unspecified: Secondary | ICD-10-CM | POA: Diagnosis present

## 2011-10-22 LAB — GLUCOSE, CAPILLARY: Glucose-Capillary: 188 mg/dL — ABNORMAL HIGH (ref 70–99)

## 2011-10-22 LAB — CBC
MCHC: 33.8 g/dL (ref 30.0–36.0)
RDW: 15 % (ref 11.5–15.5)

## 2011-10-22 LAB — BASIC METABOLIC PANEL
BUN: 28 mg/dL — ABNORMAL HIGH (ref 6–23)
Calcium: 8.5 mg/dL (ref 8.4–10.5)
Creatinine, Ser: 1.59 mg/dL — ABNORMAL HIGH (ref 0.50–1.35)
GFR calc Af Amer: 52 mL/min — ABNORMAL LOW (ref >90)
GFR calc non Af Amer: 45 mL/min — ABNORMAL LOW (ref >90)

## 2011-10-22 LAB — URINE CULTURE

## 2011-10-22 MED ORDER — SENNOSIDES-DOCUSATE SODIUM 8.6-50 MG PO TABS
2.0000 | ORAL_TABLET | Freq: Every day | ORAL | Status: DC
  Administered 2011-10-22 – 2011-10-26 (×6): 2 via ORAL
  Filled 2011-10-22 (×7): qty 2

## 2011-10-22 MED ORDER — BOOST / RESOURCE BREEZE PO LIQD
1.0000 | Freq: Two times a day (BID) | ORAL | Status: DC
  Administered 2011-10-23: 1 via ORAL

## 2011-10-22 NOTE — Progress Notes (Signed)
Pt voided another 500cc's in the urinal. Will cont to monitor. Pt denies discomfort. Tylenol given to him for fever.

## 2011-10-22 NOTE — Progress Notes (Signed)
[facility] PROGRESS NOTE  Rodney Green JYN:[id number] DOB: 1945-06-24 DOA: 10/20/2011 PCP: Katy Apo, MD  Brief narrative: Rodney Green is a 66 year old man with a PMH of relapsed multiple myeloma who was admitted 10/21/2011 with community-acquired pneumonia in the setting of recent chemotherapy and neutropenia. He was put on broad-spectrum antibiotics. Neupogen therapy has also been initiated.  Assessment/Plan: Principal Problem:  *Community acquired pneumonia in a patient with relapsed multiple myeloma  Admitted and put on broad-spectrum antibiotics consisting of imipenem and vancomycin.  Followup blood cultures. Active Problems:  Stage III chronic kidney disease  Likely related to myeloma. Creatinine currently at usual baseline values.  Urinary retention  Patient noted to have decreased urinary output and a bladder scan confirmed a 1000 cc of urine and the bladder.  Nurses attempted to catheterize x3 but were unsuccessful.  Patient was able to spontaneously void on his own 10/21/2011.  Flomax was started by cross covering physician.  Altered mental status  Patient noted to be extremely lethargic on 10/21/2011. Wife confirmed that this is significantly abnormal.    CT scan of the head was obtained 10/21/2011 and was unremarkable.  Mental status improving.  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 Mild. Monitor.  May need to consider demeclocycline if sodium continues to drop.  Multiple myeloma in relapse / normocytic anemia  Dr. Arbutus Ped saw the patient and initiated Neupogen therapy for his neutropenia.  Pomalyst on hold.  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 Patient currently on Neupogen. Pomalyst on hold.  Diabetes mellitus, type 2  Continue Lantus 64 units daily and 70/30 insulin per home regimen.  Hypertension  Continue Norvasc, HCTZ, Avapro and atenolol.  Hepatitis C  Liver function studies within  normal limits.  CKD (chronic kidney disease) stage 3, GFR 30-59 ml/min  Creatinine stable, at usual baseline values.  Hyperlipidemia  Continue statin.   Code Status: Full code. Family Communication: Wife and extended family updated at bedside. Disposition Plan: Home when stable.   Medical Consultants:  Dr. Si Gaul, Oncology.  Other Consultants:  Dietitian: No educational needs identified.  Procedures:  None.  Antibiotics:  Imipenem 10/20/2011--->  Vancomycin 10/20/2011--->  HPI/Subjective: Rodney Green is much more alert today. He denies headache, or significant nausea/vomiting. No shortness of breath or cough. Diminished appetite. No BM in several days.  Objective: Filed Vitals:   10/21/11 2118 10/21/11 2353 10/22/11 0230 10/22/11 0618  BP: 154/77   141/90  Pulse: 80   72  Temp: 101.4 F (38.6 C) 101.8 F (38.8 C) 98.8 F (37.1 C) 98.2 F (36.8 C)  TempSrc: Oral  Oral Oral  Resp: 18   19  Height:      Weight:      SpO2: 100%   98%    Intake/Output Summary (Last 24 hours) at 10/22/11 1153 Last data filed at 10/22/11 0855  Gross per 24 hour  Intake    480 ml  Output   1550 ml  Net  -1070 ml    Exam: Gen:  NAD, awake and alert. Cardiovascular:  RRR, No M/R/G Respiratory: Lungs CTAB Gastrointestinal: Abdomen soft, NT/ND with normal active bowel sounds. Extremities: No C/E/C  Data Reviewed: Basic Metabolic Panel:  Lab 10/22/11 8657 10/21/11 0528 10/21/11 0035 10/20/11 1012  NA 129* 131* 130* 134*  K 4.0 3.9 -- --  CL 95* 98 98 101  CO2 23 23 19 22   GLUCOSE 186* 117* 122* 238*  BUN 28* 29* 30* 32.0*  CREATININE  1.59* 1.48* 1.42* 1.6*  CALCIUM 8.5 8.7 9.1 --  MG -- -- -- --  PHOS -- -- -- --   GFR Estimated Creatinine Clearance: 50 ml/min (by C-G formula based on Cr of 1.59). Liver Function Tests:  Lab 10/21/11 0528 10/21/11 0035 10/20/11 1012  AST 12 13 10   ALT 11 12 13   ALKPHOS 84 95 101  BILITOT 0.7 0.6 1.10  PROT 5.9* 6.4  5.9*  ALBUMIN 2.8* 3.1* 3.0*    CBC:  Lab 10/22/11 0400 10/21/11 0528 10/21/11 0035 10/20/11 1012  WBC 2.8* 1.7* 1.9* 1.6*  NEUTROABS -- -- -- 0.7*  HGB 10.6* 10.3* 11.3* 11.4*  HCT 31.4* 30.4* 33.3* 33.7*  MCV 92.6 92.4 92.5 96.0  PLT 167 164 165 154   CBG:  Lab 10/22/11 0800 10/21/11 2240 10/21/11 1650 10/21/11 1226 10/21/11 0819  GLUCAP 188* 191* 167* 154* 89   Microbiology Recent Results (from the past 240 hour(s))  CULTURE, BLOOD (ROUTINE X 2)     Status: Normal (Preliminary result)   Collection Time   10/21/11 12:35 AM      Component Value Range Status Comment   Specimen Description BLOOD RIGHT ANTECUBITAL   Final    Special Requests BOTTLES DRAWN AEROBIC AND ANAEROBIC 4CC   Final    Culture  Setup Time 10/21/2011 04:36   Final    Culture     Final    Value:        BLOOD CULTURE RECEIVED NO GROWTH TO DATE CULTURE WILL BE HELD FOR 5 DAYS BEFORE ISSUING A FINAL NEGATIVE REPORT   Report Status PENDING   Incomplete   CULTURE, BLOOD (ROUTINE X 2)     Status: Normal (Preliminary result)   Collection Time   10/21/11  1:21 AM      Component Value Range Status Comment   Specimen Description BLOOD NO SITE INDICATED   Final    Special Requests     Final    Value: BOTTLES DRAWN AEROBIC AND ANAEROBIC NO VOLUME INDICATED   Culture  Setup Time 10/21/2011 04:36   Final    Culture     Final    Value:        BLOOD CULTURE RECEIVED NO GROWTH TO DATE CULTURE WILL BE HELD FOR 5 DAYS BEFORE ISSUING A FINAL NEGATIVE REPORT   Report Status PENDING   Incomplete   URINE CULTURE     Status: Normal   Collection Time   10/21/11  9:08 AM      Component Value Range Status Comment   Specimen Description URINE, RANDOM   Final    Special Requests NONE   Final    Culture  Setup Time 10/21/2011 14:12   Final    Colony Count NO GROWTH   Final    Culture NO GROWTH   Final    Report Status 10/22/2011 FINAL   Final      Studies:  Dg Chest 2 View 10/21/2011  IMPRESSION:  1.  Streaky right basilar  airspace opacity could be due to atelectasis or pneumonia. 2.  Findings consistent with multiple myeloma.   Original Report Authenticated By: Bernadene Bell. Maricela Curet, M.D.     Ct Head Wo Contrast 10/21/2011  IMPRESSION: 1. Negative for bleed or other acute intracranial process.   Original Report Authenticated By: Thora Lance III, M.D.     Scheduled Meds:    . amLODipine  5 mg Oral Daily  . antiseptic oral rinse  15 mL Mouth Rinse q12n4p  .  aspirin  81 mg Oral Daily  . atenolol  100 mg Oral Daily  . bisacodyl  5 mg Oral Daily  . chlorhexidine  15 mL Mouth Rinse BID  . dexamethasone  40 mg Oral Weekly  . docusate sodium  200 mg Oral Daily  . docusate sodium  300 mg Oral QHS  . feeding supplement  237 mL Oral BID BM  . filgrastim  480 mcg Subcutaneous Daily  . hydrochlorothiazide  12.5 mg Oral Daily   And  . irbesartan  150 mg Oral Daily  . imipenem-cilastatin  500 mg Intravenous Q8H  . insulin aspart  0-15 Units Subcutaneous TID WC  . insulin aspart  0-5 Units Subcutaneous QHS  . insulin aspart protamine-insulin aspart  2-28 Units Subcutaneous Weekly  . insulin glargine  64 Units Subcutaneous QHS  . oxyCODONE  40 mg Oral Q12H  . simvastatin  10 mg Oral QHS  . sodium chloride  3 mL Intravenous Q12H  . Tamsulosin HCl  0.4 mg Oral QPC supper   Continuous Infusions:   Time spent: 35 minutes.   LOS: 2 days   Anetta Olvera  [facility] Pager [phone].  If 8PM-8AM, please contact night-coverage at [email or web address], password [facility] 10/22/2011, 11:53 AM              pr

## 2011-10-22 NOTE — Telephone Encounter (Signed)
Signed and faxed to [employer]

## 2011-10-22 NOTE — Progress Notes (Signed)
Pt's temp is 101.4. Text sent to on call MD.

## 2011-10-22 NOTE — Telephone Encounter (Signed)
PT currently in hospital

## 2011-10-22 NOTE — Progress Notes (Signed)
Again attempted to catherize patient. Pt c/o pain and stated that he really needed to pee. Gave pt urinal and he voided 500 ccs on his own. Will cont to monitor and notify MD on call.

## 2011-10-22 NOTE — Progress Notes (Signed)
Patient bladder scanned, 471 cc of urine present.  Pt states does not need to void at this time.  Pt voided earlier in shift with great difficulty.  Md notified and orders obtained.

## 2011-10-22 NOTE — Progress Notes (Signed)
MD paged again regarding elevated temp.

## 2011-10-23 LAB — BASIC METABOLIC PANEL
CO2: 25 mEq/L (ref 19–32)
Calcium: 8.3 mg/dL — ABNORMAL LOW (ref 8.4–10.5)
Creatinine, Ser: 1.73 mg/dL — ABNORMAL HIGH (ref 0.50–1.35)
GFR calc non Af Amer: 40 mL/min — ABNORMAL LOW (ref >90)
Glucose, Bld: 195 mg/dL — ABNORMAL HIGH (ref 70–99)

## 2011-10-23 LAB — GLUCOSE, CAPILLARY
Glucose-Capillary: 225 mg/dL — ABNORMAL HIGH (ref 70–99)
Glucose-Capillary: 193 mg/dL — ABNORMAL HIGH (ref 70–99)

## 2011-10-23 LAB — CBC
MCH: 31.1 pg (ref 26.0–34.0)
MCV: 92.1 fL (ref 78.0–100.0)
Platelets: 187 10*3/uL (ref 150–400)
RDW: 14.7 % (ref 11.5–15.5)

## 2011-10-23 MED ORDER — INSULIN ASPART 100 UNIT/ML ~~LOC~~ SOLN
0.0000 [IU] | Freq: Three times a day (TID) | SUBCUTANEOUS | Status: DC
  Administered 2011-10-23 – 2011-10-24 (×2): 7 [IU] via SUBCUTANEOUS
  Administered 2011-10-24: 4 [IU] via SUBCUTANEOUS
  Administered 2011-10-24 – 2011-10-26 (×3): 3 [IU] via SUBCUTANEOUS
  Administered 2011-10-27: 4 [IU] via SUBCUTANEOUS

## 2011-10-23 MED ORDER — VANCOMYCIN HCL 1000 MG IV SOLR
750.0000 mg | Freq: Two times a day (BID) | INTRAVENOUS | Status: DC
  Administered 2011-10-23 – 2011-10-26 (×7): 750 mg via INTRAVENOUS
  Filled 2011-10-23 (×9): qty 750

## 2011-10-23 MED ORDER — INSULIN ASPART 100 UNIT/ML ~~LOC~~ SOLN
0.0000 [IU] | Freq: Every day | SUBCUTANEOUS | Status: DC
  Administered 2011-10-25: 2 [IU] via SUBCUTANEOUS

## 2011-10-23 MED ORDER — BISACODYL 10 MG RE SUPP
10.0000 mg | Freq: Once | RECTAL | Status: AC
  Administered 2011-10-23: 10 mg via RECTAL
  Filled 2011-10-23: qty 1

## 2011-10-23 NOTE — Progress Notes (Signed)
ANTIBIOTIC CONSULT NOTE - INITIAL  Pharmacy Consult for Vancomycin Indication: Febrile neutropenia  Allergies  Allergen Reactions  . Ace Inhibitors     He is not sure about allergy to ace inhibitors   Patient Measurements: Height: 5\' 7"  (170.2 cm) Weight: 190 lb 14.7 oz (86.6 kg) IBW/kg (Calculated) : 66.1   Vital Signs: Temp: 100.9 F (38.3 C) (08/30 0655) Temp src: Oral (08/30 0655) BP: 144/69 mmHg (08/30 0655) Pulse Rate: 87  (08/30 0655) Intake/Output from previous day: 08/29 0701 - 08/30 0700 In: 1355.5 [P.O.:840; I.V.:315.5; IV Piggyback:200] Out: 300 [Urine:300] Intake/Output from this shift:   Labs:  Basename 10/23/11 0641 10/22/11 0400 10/21/11 0528  WBC 3.4* 2.8* 1.7*  HGB 9.9* 10.6* 10.3*  PLT 187 167 164  LABCREA -- -- --  CREATININE 1.73* 1.59* 1.48*   Estimated Creatinine Clearance: 45.9 ml/min (by C-G formula based on Cr of 1.73). No results found for this basename: VANCOTROUGH:2,VANCOPEAK:2,VANCORANDOM:2,GENTTROUGH:2,GENTPEAK:2,GENTRANDOM:2,TOBRATROUGH:2,TOBRAPEAK:2,TOBRARND:2,AMIKACINPEAK:2,AMIKACINTROU:2,AMIKACIN:2, in the last 72 hours   Microbiology: Recent Results (from the past 720 hour(s))  CULTURE, BLOOD (ROUTINE X 2)     Status: Normal (Preliminary result)   Collection Time   10/21/11 12:35 AM      Component Value Range Status Comment   Specimen Description BLOOD RIGHT ANTECUBITAL   Final    Special Requests BOTTLES DRAWN AEROBIC AND ANAEROBIC 4CC   Final    Culture  Setup Time 10/21/2011 04:36   Final    Culture     Final    Value:        BLOOD CULTURE RECEIVED NO GROWTH TO DATE CULTURE WILL BE HELD FOR 5 DAYS BEFORE ISSUING A FINAL NEGATIVE REPORT   Report Status PENDING   Incomplete   CULTURE, BLOOD (ROUTINE X 2)     Status: Normal (Preliminary result)   Collection Time   10/21/11  1:21 AM      Component Value Range Status Comment   Specimen Description BLOOD NO SITE INDICATED   Final    Special Requests     Final    Value: BOTTLES  DRAWN AEROBIC AND ANAEROBIC NO VOLUME INDICATED   Culture  Setup Time 10/21/2011 04:36   Final    Culture     Final    Value:        BLOOD CULTURE RECEIVED NO GROWTH TO DATE CULTURE WILL BE HELD FOR 5 DAYS BEFORE ISSUING A FINAL NEGATIVE REPORT   Report Status PENDING   Incomplete   URINE CULTURE     Status: Normal   Collection Time   10/21/11  9:08 AM      Component Value Range Status Comment   Specimen Description URINE, RANDOM   Final    Special Requests NONE   Final    Culture  Setup Time 10/21/2011 14:12   Final    Colony Count NO GROWTH   Final    Culture NO GROWTH   Final    Report Status 10/22/2011 FINAL   Final     Medical History: Past Medical History  Diagnosis Date  . Allergy   . Diabetes mellitus   . Hypertension   . Hepatitis C   . Multiple myeloma   . Chronic kidney disease     Medications:  Anti-infectives     Start     Dose/Rate Route Frequency Ordered Stop   10/23/11 0900   vancomycin (VANCOCIN) 750 mg in sodium chloride 0.9 % 150 mL IVPB        750 mg 150  mL/hr over 60 Minutes Intravenous Every 12 hours 10/23/11 0829     10/21/11 0600   imipenem-cilastatin (PRIMAXIN) 500 mg in sodium chloride 0.9 % 100 mL IVPB        500 mg 200 mL/hr over 30 Minutes Intravenous 3 times per day 10/21/11 0447     10/21/11 0200   vancomycin (VANCOCIN) IVPB 1000 mg/200 mL premix        1,000 mg 200 mL/hr over 60 Minutes Intravenous  Once 10/21/11 0155 10/21/11 0322   10/21/11 0200   piperacillin-tazobactam (ZOSYN) IVPB 3.375 g  Status:  Discontinued        3.375 g 12.5 mL/hr over 240 Minutes Intravenous  Once 10/21/11 0155 10/21/11 0447         Assessment:  66 yo male admitted with CAP; hx of relapsed MM, recent chemo, febrile neutropenia.  Pt received Vancomycin x 1 in ED, started on Primaxin 500mg  q8h, Filgrastim x 4 days.  Urine culture no growth, Blood cultures no growth-pending.  Continue Vancomycin per pharmacy  Creatinine elevated, watching renal  function closely  Goal of Therapy:  Vancomycin trough level 15-20 mcg/ml  Plan:   Vancomycin 750mg  q12  Monitor renal function, would adjust Primaxin if renal function worsens.  Otho Bellows PharmD Pager [phone] 10/23/2011,8:30 AM

## 2011-10-23 NOTE — Progress Notes (Signed)
Patient with fever of >101. Texted NP on call via amion about fever. ? About antibiotics. Dr Darnelle Catalan note mentioned patient being on Vancomycin and Primaxin but vancomycin was only one time order in ED. No new orders placed in EPIC. Will monitor.

## 2011-10-23 NOTE — Progress Notes (Signed)
[facility] PROGRESS NOTE  Rodney Green NWG:[id number] DOB: 05/18/1945 DOA: 10/20/2011 PCP: Katy Apo, MD  Brief narrative: Rodney Green is a 66 year old man with a PMH of relapsed multiple myeloma who was admitted 10/21/2011 with questionable community-acquired pneumonia in the setting of recent chemotherapy and neutropenia. He was put on broad-spectrum antibiotics. Neupogen therapy has also been initiated.  Assessment/Plan: Principal Problem:  *Community acquired pneumonia in a patient with relapsed multiple myeloma  Admitted and put on broad-spectrum antibiotics consisting of imipenem and vancomycin.  Unfortunately Vancomycin ordered only x 1.  Re-ordered today.  Urine culture negative,  blood cultures negative to date.  If still febrile in next 24 hours, consider CT chest to rule out empyema. Active Problems:  Stage III chronic kidney disease  Likely related to myeloma. Creatinine currently at usual baseline values.  Urinary retention  Patient noted to have decreased urinary output and a bladder scan confirmed a 1000 cc of urine and the bladder.  Nurses attempted to catheterize x3 but were unsuccessful.  Patient was able to spontaneously void on his own 10/21/2011.  Continue Flomax.  Altered mental status  Patient noted to be extremely lethargic on 10/21/2011. Wife confirmed that this is significantly abnormal.    CT scan of the head was obtained 10/21/2011 and was unremarkable.  No complaints of headache or signs of meningismus.  Mental status improving.  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 Mild. Monitor.  May need to consider demeclocycline if sodium continues to drop.  Multiple myeloma in relapse / normocytic anemia  Dr. Arbutus Ped saw the patient and initiated Neupogen therapy for his neutropenia.  Pomalyst on hold.  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 Patient currently on Neupogen. Pomalyst on hold.  Diabetes mellitus, type 2  Continue Lantus 64 units daily and change SSI to resistant scale for better glycemic control.  Hypertension  Continue Norvasc, HCTZ, Avapro and atenolol.  Hepatitis C  Liver function studies within normal limits.  CKD (chronic kidney disease) stage 3, GFR 30-59 ml/min  Creatinine stable, at usual baseline values.  Hyperlipidemia  Continue statin.   Code Status: Full code. Family Communication: Wife updated at bedside. Disposition Plan: Home when stable.   Medical Consultants:  Dr. Si Gaul, Oncology.  Other Consultants:  Dietitian: No educational needs identified.  Procedures:  None.  Antibiotics:  Imipenem 10/20/2011--->  Vancomycin 10/20/2011 x 1 dose; Re-ordered 10/23/11--->  HPI/Subjective: Rodney Green is still having periods of lethargy and fever/sweats.  He has had some right sub-scapular pain, but no dyspnea or cough.  No abdominal pain, N/V.  He has has some frequency, hesitancy.  Awakens to voice and answers questions appropriately.  Objective: Filed Vitals:   10/22/11 2116 10/23/11 0135 10/23/11 0415 10/23/11 0655  BP: 128/72 135/86 128/88 144/69  Pulse: 85 73 94 87  Temp: 101.1 F (38.4 C) 98.6 F (37 C) 99.6 F (37.6 C) 100.9 F (38.3 C)  TempSrc: Oral Oral Oral Oral  Resp: 18 18 16 18   Height:      Weight:      SpO2: 96% 100%  99%    Intake/Output Summary (Last 24 hours) at 10/23/11 0917 Last data filed at 10/23/11 0607  Gross per 24 hour  Intake 1330.49 ml  Output    875 ml  Net 455.49 ml    Exam: Gen:  NAD, somnolent, but awakens to voice. Cardiovascular:  RRR, No M/R/G Respiratory: Lungs CTAB Gastrointestinal: Abdomen soft, NT/ND with normal active bowel sounds.  Extremities: No C/E/C  Data Reviewed: Basic Metabolic Panel:  Lab 10/23/11 1191 10/22/11 0400 10/21/11 0528 10/21/11 0035 10/20/11 1012  NA 129* 129* 131* 130* 134*  K 4.1 4.0 -- -- --  CL 96 95* 98 98 101  CO2 25 23 23 19 22     GLUCOSE 195* 186* 117* 122* 238*  BUN 37* 28* 29* 30* 32.0*  CREATININE 1.73* 1.59* 1.48* 1.42* 1.6*  CALCIUM 8.3* 8.5 8.7 9.1 --  MG -- -- -- -- --  PHOS -- -- -- -- --   GFR Estimated Creatinine Clearance: 45.9 ml/min (by C-G formula based on Cr of 1.73). Liver Function Tests:  Lab 10/21/11 0528 10/21/11 0035 10/20/11 1012  AST 12 13 10   ALT 11 12 13   ALKPHOS 84 95 101  BILITOT 0.7 0.6 1.10  PROT 5.9* 6.4 5.9*  ALBUMIN 2.8* 3.1* 3.0*    CBC:  Lab 10/23/11 0641 10/22/11 0400 10/21/11 0528 10/21/11 0035 10/20/11 1012  WBC 3.4* 2.8* 1.7* 1.9* 1.6*  NEUTROABS -- -- -- -- 0.7*  HGB 9.9* 10.6* 10.3* 11.3* 11.4*  HCT 29.3* 31.4* 30.4* 33.3* 33.7*  MCV 92.1 92.6 92.4 92.5 96.0  PLT 187 167 164 165 154   CBG:  Lab 10/23/11 0713 10/23/11 0412 10/22/11 2142 10/22/11 1754 10/22/11 1232  GLUCAP 226* 190* 193* 172* 213*   Microbiology Recent Results (from the past 240 hour(s))  CULTURE, BLOOD (ROUTINE X 2)     Status: Normal (Preliminary result)   Collection Time   10/21/11 12:35 AM      Component Value Range Status Comment   Specimen Description BLOOD RIGHT ANTECUBITAL   Final    Special Requests BOTTLES DRAWN AEROBIC AND ANAEROBIC 4CC   Final    Culture  Setup Time 10/21/2011 04:36   Final    Culture     Final    Value:        BLOOD CULTURE RECEIVED NO GROWTH TO DATE CULTURE WILL BE HELD FOR 5 DAYS BEFORE ISSUING A FINAL NEGATIVE REPORT   Report Status PENDING   Incomplete   CULTURE, BLOOD (ROUTINE X 2)     Status: Normal (Preliminary result)   Collection Time   10/21/11  1:21 AM      Component Value Range Status Comment   Specimen Description BLOOD NO SITE INDICATED   Final    Special Requests     Final    Value: BOTTLES DRAWN AEROBIC AND ANAEROBIC NO VOLUME INDICATED   Culture  Setup Time 10/21/2011 04:36   Final    Culture     Final    Value:        BLOOD CULTURE RECEIVED NO GROWTH TO DATE CULTURE WILL BE HELD FOR 5 DAYS BEFORE ISSUING A FINAL NEGATIVE REPORT    Report Status PENDING   Incomplete   URINE CULTURE     Status: Normal   Collection Time   10/21/11  9:08 AM      Component Value Range Status Comment   Specimen Description URINE, RANDOM   Final    Special Requests NONE   Final    Culture  Setup Time 10/21/2011 14:12   Final    Colony Count NO GROWTH   Final    Culture NO GROWTH   Final    Report Status 10/22/2011 FINAL   Final      Studies:  Dg Chest 2 View 10/21/2011  IMPRESSION:  1.  Streaky right basilar airspace opacity could be due to  atelectasis or pneumonia. 2.  Findings consistent with multiple myeloma.   Original Report Authenticated By: Bernadene Bell. Maricela Curet, M.D.     Ct Head Wo Contrast 10/21/2011  IMPRESSION: 1. Negative for bleed or other acute intracranial process.   Original Report Authenticated By: Thora Lance III, M.D.     Scheduled Meds:    . amLODipine  5 mg Oral Daily  . antiseptic oral rinse  15 mL Mouth Rinse q12n4p  . aspirin  81 mg Oral Daily  . atenolol  100 mg Oral Daily  . bisacodyl  5 mg Oral Daily  . chlorhexidine  15 mL Mouth Rinse BID  . docusate sodium  200 mg Oral Daily  . docusate sodium  300 mg Oral QHS  . feeding supplement  1 Container Oral BID BM  . filgrastim  480 mcg Subcutaneous Daily  . hydrochlorothiazide  12.5 mg Oral Daily   And  . irbesartan  150 mg Oral Daily  . imipenem-cilastatin  500 mg Intravenous Q8H  . insulin aspart  0-15 Units Subcutaneous TID WC  . insulin aspart  0-5 Units Subcutaneous QHS  . insulin glargine  64 Units Subcutaneous QHS  . oxyCODONE  40 mg Oral Q12H  . senna-docusate  2 tablet Oral QHS  . simvastatin  10 mg Oral QHS  . sodium chloride  3 mL Intravenous Q12H  . Tamsulosin HCl  0.4 mg Oral QPC supper  . vancomycin  750 mg Intravenous Q12H  . DISCONTD: dexamethasone  40 mg Oral Weekly  . DISCONTD: feeding supplement  237 mL Oral BID BM  . DISCONTD: insulin aspart protamine-insulin aspart  2-28 Units Subcutaneous Weekly   Continuous Infusions:    Time spent: 35 minutes.   LOS: 3 days   Corbin Falck  [facility] Pager [phone].  If 8PM-8AM, please contact night-coverage at [email or web address], password [facility] 10/23/2011, 9:17 AM              pr

## 2011-10-23 NOTE — Progress Notes (Signed)
Patient is very drowsy/sleepy. He will arouse but only for few moments and disoriented when he does. Per notes and spouse patient has been "doing this" for few days. Already had a CAT scan. VSS and CBG 190. Did void 575 cc around 0100 into condom cath. Still no bowel movement since Sunday. Will monitor.

## 2011-10-24 ENCOUNTER — Inpatient Hospital Stay (HOSPITAL_COMMUNITY): Payer: 59

## 2011-10-24 LAB — BASIC METABOLIC PANEL
CO2: 24 mEq/L (ref 19–32)
Chloride: 97 mEq/L (ref 96–112)
Potassium: 3.7 mEq/L (ref 3.5–5.1)
Sodium: 131 mEq/L — ABNORMAL LOW (ref 135–145)

## 2011-10-24 LAB — CBC
HCT: 28.7 % — ABNORMAL LOW (ref 39.0–52.0)
Hemoglobin: 9.8 g/dL — ABNORMAL LOW (ref 13.0–17.0)
MCV: 91.4 fL (ref 78.0–100.0)
Platelets: 223 10*3/uL (ref 150–400)
RBC: 3.14 MIL/uL — ABNORMAL LOW (ref 4.22–5.81)
WBC: 3.4 10*3/uL — ABNORMAL LOW (ref 4.0–10.5)

## 2011-10-24 LAB — GLUCOSE, CAPILLARY

## 2011-10-24 MED ORDER — PEG 3350-KCL-NA BICARB-NACL 420 G PO SOLR
4000.0000 mL | Freq: Once | ORAL | Status: AC | PRN
  Filled 2011-10-24: qty 4000

## 2011-10-24 MED ORDER — SODIUM CHLORIDE 0.9 % IV SOLN
INTRAVENOUS | Status: DC
  Administered 2011-10-24 – 2011-10-27 (×6): via INTRAVENOUS

## 2011-10-24 MED ORDER — FLEET ENEMA 7-19 GM/118ML RE ENEM
1.0000 | ENEMA | Freq: Once | RECTAL | Status: AC
  Administered 2011-10-24: 1 via RECTAL

## 2011-10-24 NOTE — Progress Notes (Addendum)
[facility] PROGRESS NOTE  Rodney Green NWG:[id number] DOB: 22-Apr-1945 DOA: 10/20/2011 PCP: Katy Apo, MD  Brief narrative: Rodney Green is a 66 year old man with a PMH of relapsed multiple myeloma who was admitted 10/21/2011 with questionable community-acquired pneumonia in the setting of recent chemotherapy and neutropenia. He was put on broad-spectrum antibiotics. Neupogen therapy has also been initiated.  Assessment/Plan: Principal Problem:  *Community acquired pneumonia in a patient with relapsed multiple myeloma  Admitted and put on broad-spectrum antibiotics consisting of imipenem and vancomycin.  Unfortunately Vancomycin ordered only x 1.  Re-ordered today.  Urine culture negative,  blood cultures negative to date.  Still spiking fevers despite being on broad spectrum antibiotics.  Get CT chest to rule out empyema. Active Problems:  Constipation  Continue bowel regimen.  Stage III chronic kidney disease  Likely related to myeloma.   Creatinine up since admission, may be due to some dehydration from diaphoretic episodes.  Will start NS at 100 cc/hr and hold HCTZ/Avapro.  Urinary retention  Patient noted to have decreased urinary output and a bladder scan confirmed a 1000 cc of urine and the bladder.  Nurses attempted to catheterize x3 but were unsuccessful.  Patient was able to spontaneously void on his own 10/21/2011.  Continue Flomax.  Altered mental status  Patient noted to be extremely lethargic on 10/21/2011. Wife confirmed that this is significantly abnormal.    CT scan of the head was obtained 10/21/2011 and was unremarkable.  No complaints of headache or signs of meningismus.  Mental status improving.  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 Mild. Monitor.  May need to consider demeclocycline if sodium continues to drop.  Multiple myeloma in relapse / normocytic anemia  Dr. Arbutus Ped saw the patient and initiated Neupogen  therapy for his neutropenia.  Pomalyst on hold.  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 Patient currently on Neupogen. Pomalyst on hold.  Diabetes mellitus, type 2  Continue Lantus 64 units daily and change SSI to resistant scale for better glycemic control.  Hypertension  Continue Norvasc, and atenolol.  Hepatitis C  Liver function studies within normal limits.  Hyperlipidemia  Continue statin.   Code Status: Full code. Family Communication: Wife updated at bedside. Disposition Plan: Home when stable.   Medical Consultants:  Dr. Si Gaul, Oncology.  Other Consultants:  Dietitian: No educational needs identified.  Procedures:  None.  Antibiotics:  Imipenem 10/20/2011--->  Vancomycin 10/20/2011 x 1 dose; Re-ordered 10/23/11--->  HPI/Subjective: Rodney Green is still having periods of lethargy and fever/sweats. He is restless, but denies pain or discomfort including indigestion.  No BM yet despite dulcolax suppository yesterday, and PO dulcolax, sennokot and Colace given last night.   Objective: Filed Vitals:   10/23/11 2130 10/24/11 0200 10/24/11 0204 10/24/11 0621  BP: 100/52  129/69 91/58  Pulse: 64  82 66  Temp: 98.4 F (36.9 C)  101.6 F (38.7 C) 98.5 F (36.9 C)  TempSrc: Oral  Oral Oral  Resp: 18  18 18   Height:      Weight:      SpO2: 93% 88% 93% 95%    Intake/Output Summary (Last 24 hours) at 10/24/11 1007 Last data filed at 10/24/11 0900  Gross per 24 hour  Intake 2025.99 ml  Output    600 ml  Net 1425.99 ml    Exam: Gen:  NAD, awake but restless. Cardiovascular:  RRR, No M/R/G Respiratory: Lungs CTAB Gastrointestinal: Abdomen soft, NT/ND with hyperactive bowel sounds. Extremities:  No C/E/C  Data Reviewed: Basic Metabolic Panel:  Lab 10/24/11 1610 10/23/11 0641 10/22/11 0400 10/21/11 0528 10/21/11 0035  NA 131* 129* 129* 131* 130*  K 3.7 4.1 -- -- --  CL 97 96 95* 98 98  CO2 24 25 23 23 19   GLUCOSE  160* 195* 186* 117* 122*  BUN 45* 37* 28* 29* 30*  CREATININE 1.97* 1.73* 1.59* 1.48* 1.42*  CALCIUM 8.2* 8.3* 8.5 8.7 9.1  MG -- -- -- -- --  PHOS -- -- -- -- --   GFR Estimated Creatinine Clearance: 40.3 ml/min (by C-G formula based on Cr of 1.97). Liver Function Tests:  Lab 10/21/11 0528 10/21/11 0035 10/20/11 1012  AST 12 13 10   ALT 11 12 13   ALKPHOS 84 95 101  BILITOT 0.7 0.6 1.10  PROT 5.9* 6.4 5.9*  ALBUMIN 2.8* 3.1* 3.0*    CBC:  Lab 10/24/11 0515 10/23/11 0641 10/22/11 0400 10/21/11 0528 10/21/11 0035  WBC 3.4* 3.4* 2.8* 1.7* 1.9*  NEUTROABS -- -- -- -- --  HGB 9.8* 9.9* 10.6* 10.3* 11.3*  HCT 28.7* 29.3* 31.4* 30.4* 33.3*  MCV 91.4 92.1 92.6 92.4 92.5  PLT 223 187 167 164 165   CBG:  Lab 10/23/11 2136 10/23/11 1734 10/23/11 1255 10/23/11 0713 10/23/11 0412  GLUCAP 193* 153* 225* 226* 190*   Microbiology Recent Results (from the past 240 hour(s))  CULTURE, BLOOD (ROUTINE X 2)     Status: Normal (Preliminary result)   Collection Time   10/21/11 12:35 AM      Component Value Range Status Comment   Specimen Description BLOOD RIGHT ANTECUBITAL   Final    Special Requests BOTTLES DRAWN AEROBIC AND ANAEROBIC 4CC   Final    Culture  Setup Time 10/21/2011 04:36   Final    Culture     Final    Value:        BLOOD CULTURE RECEIVED NO GROWTH TO DATE CULTURE WILL BE HELD FOR 5 DAYS BEFORE ISSUING A FINAL NEGATIVE REPORT   Report Status PENDING   Incomplete   CULTURE, BLOOD (ROUTINE X 2)     Status: Normal (Preliminary result)   Collection Time   10/21/11  1:21 AM      Component Value Range Status Comment   Specimen Description BLOOD NO SITE INDICATED   Final    Special Requests     Final    Value: BOTTLES DRAWN AEROBIC AND ANAEROBIC NO VOLUME INDICATED   Culture  Setup Time 10/21/2011 04:36   Final    Culture     Final    Value:        BLOOD CULTURE RECEIVED NO GROWTH TO DATE CULTURE WILL BE HELD FOR 5 DAYS BEFORE ISSUING A FINAL NEGATIVE REPORT   Report Status  PENDING   Incomplete   URINE CULTURE     Status: Normal   Collection Time   10/21/11  9:08 AM      Component Value Range Status Comment   Specimen Description URINE, RANDOM   Final    Special Requests NONE   Final    Culture  Setup Time 10/21/2011 14:12   Final    Colony Count NO GROWTH   Final    Culture NO GROWTH   Final    Report Status 10/22/2011 FINAL   Final      Studies:  Dg Chest 2 View 10/21/2011  IMPRESSION:  1.  Streaky right basilar airspace opacity could be due to atelectasis or  pneumonia. 2.  Findings consistent with multiple myeloma.   Original Report Authenticated By: Bernadene Bell. Maricela Curet, M.D.     Ct Head Wo Contrast 10/21/2011  IMPRESSION: 1. Negative for bleed or other acute intracranial process.   Original Report Authenticated By: Thora Lance III, M.D.     Scheduled Meds:    . amLODipine  5 mg Oral Daily  . antiseptic oral rinse  15 mL Mouth Rinse q12n4p  . aspirin  81 mg Oral Daily  . atenolol  100 mg Oral Daily  . bisacodyl  5 mg Oral Daily  . bisacodyl  10 mg Rectal Once  . chlorhexidine  15 mL Mouth Rinse BID  . docusate sodium  200 mg Oral Daily  . docusate sodium  300 mg Oral QHS  . filgrastim  480 mcg Subcutaneous Daily  . hydrochlorothiazide  12.5 mg Oral Daily   And  . irbesartan  150 mg Oral Daily  . imipenem-cilastatin  500 mg Intravenous Q8H  . insulin aspart  0-20 Units Subcutaneous TID WC  . insulin aspart  0-5 Units Subcutaneous QHS  . insulin glargine  64 Units Subcutaneous QHS  . oxyCODONE  40 mg Oral Q12H  . senna-docusate  2 tablet Oral QHS  . simvastatin  10 mg Oral QHS  . sodium chloride  3 mL Intravenous Q12H  . Tamsulosin HCl  0.4 mg Oral QPC supper  . vancomycin  750 mg Intravenous Q12H  . DISCONTD: feeding supplement  1 Container Oral BID BM  . DISCONTD: insulin aspart  0-15 Units Subcutaneous TID WC  . DISCONTD: insulin aspart  0-5 Units Subcutaneous QHS   Continuous Infusions:   Time spent: 35 minutes.   LOS: 4  days   RAMA,CHRISTINA  [facility] Pager [phone].  If 8PM-8AM, please contact night-coverage at [email or web address], password [facility] 10/24/2011, 10:07 AM              pr

## 2011-10-24 NOTE — Progress Notes (Signed)
ANTIBIOTIC CONSULT NOTE - follow up  Pharmacy Consult for Vancomycin Indication: Febrile neutropenia  Allergies  Allergen Reactions  . Ace Inhibitors     He is not sure about allergy to ace inhibitors   Patient Measurements: Height: 5\' 7"  (170.2 cm) Weight: 190 lb 14.7 oz (86.6 kg) IBW/kg (Calculated) : 66.1   Vital Signs: Temp: 98.2 F (36.8 C) (08/31 1431) Temp src: Oral (08/31 1431) BP: 118/72 mmHg (08/31 1431) Pulse Rate: 64  (08/31 1431) Intake/Output from previous day: 08/30 0701 - 08/31 0700 In: 1786 [P.O.:870; I.V.:566; IV Piggyback:350] Out: 600 [Urine:600] Intake/Output from this shift:   Labs:  Basename 10/24/11 0515 10/23/11 0641 10/22/11 0400  WBC 3.4* 3.4* 2.8*  HGB 9.8* 9.9* 10.6*  PLT 223 187 167  LABCREA -- -- --  CREATININE 1.97* 1.73* 1.59*   Estimated Creatinine Clearance: 40.3 ml/min (by C-G formula based on Cr of 1.97).  Basename 10/24/11 2100  VANCOTROUGH 13.7  VANCOPEAK --  VANCORANDOM --  GENTTROUGH --  GENTPEAK --  GENTRANDOM --  TOBRATROUGH --  TOBRAPEAK --  TOBRARND --  AMIKACINPEAK --  AMIKACINTROU --  AMIKACIN --     Microbiology: Recent Results (from the past 720 hour(s))  CULTURE, BLOOD (ROUTINE X 2)     Status: Normal (Preliminary result)   Collection Time   10/21/11 12:35 AM      Component Value Range Status Comment   Specimen Description BLOOD RIGHT ANTECUBITAL   Final    Special Requests BOTTLES DRAWN AEROBIC AND ANAEROBIC 4CC   Final    Culture  Setup Time 10/21/2011 04:36   Final    Culture     Final    Value:        BLOOD CULTURE RECEIVED NO GROWTH TO DATE CULTURE WILL BE HELD FOR 5 DAYS BEFORE ISSUING A FINAL NEGATIVE REPORT   Report Status PENDING   Incomplete   CULTURE, BLOOD (ROUTINE X 2)     Status: Normal (Preliminary result)   Collection Time   10/21/11  1:21 AM      Component Value Range Status Comment   Specimen Description BLOOD NO SITE INDICATED   Final    Special Requests     Final    Value:  BOTTLES DRAWN AEROBIC AND ANAEROBIC NO VOLUME INDICATED   Culture  Setup Time 10/21/2011 04:36   Final    Culture     Final    Value:        BLOOD CULTURE RECEIVED NO GROWTH TO DATE CULTURE WILL BE HELD FOR 5 DAYS BEFORE ISSUING A FINAL NEGATIVE REPORT   Report Status PENDING   Incomplete   URINE CULTURE     Status: Normal   Collection Time   10/21/11  9:08 AM      Component Value Range Status Comment   Specimen Description URINE, RANDOM   Final    Special Requests NONE   Final    Culture  Setup Time 10/21/2011 14:12   Final    Colony Count NO GROWTH   Final    Culture NO GROWTH   Final    Report Status 10/22/2011 FINAL   Final     Medical History: Past Medical History  Diagnosis Date  . Allergy   . Diabetes mellitus   . Hypertension   . Hepatitis C   . Multiple myeloma   . Chronic kidney disease     Medications:  Anti-infectives     Start     Dose/Rate Route  Frequency Ordered Stop   10/23/11 0900   vancomycin (VANCOCIN) 750 mg in sodium chloride 0.9 % 150 mL IVPB        750 mg 150 mL/hr over 60 Minutes Intravenous Every 12 hours 10/23/11 0829     10/21/11 0600   imipenem-cilastatin (PRIMAXIN) 500 mg in sodium chloride 0.9 % 100 mL IVPB        500 mg 200 mL/hr over 30 Minutes Intravenous 3 times per day 10/21/11 0447     10/21/11 0200   vancomycin (VANCOCIN) IVPB 1000 mg/200 mL premix        1,000 mg 200 mL/hr over 60 Minutes Intravenous  Once 10/21/11 0155 10/21/11 0322   10/21/11 0200   piperacillin-tazobactam (ZOSYN) IVPB 3.375 g  Status:  Discontinued        3.375 g 12.5 mL/hr over 240 Minutes Intravenous  Once 10/21/11 0155 10/21/11 0447         Assessment:  66 yo male admitted with CAP; hx of relapsed MM, recent chemo, febrile neutropenia.  D# 2 Vanc 750mg  IV q12h. D# 3 Primaxin 500mg  IV q8h  Scr up, CrCl 63ml/min. Bcx x 2 and Ucx no growth. WBC 3.4 8/3. Tm 101.6  Vancomycin trough tonight is slightly below goal (13.7)  Urine culture no growth, Blood  cultures no growth-pending.  Goal of Therapy:  Vancomycin trough level 15-20 mcg/ml  Plan:   Will continue vancomycin 750mg  q12, would expect some accumulation especially with increasing Scr  Continue to monitor and adjust as necessary. Vanc trough recheck as appropriate.  Gwen Her PharmD  [phone] 10/24/2011 9:52 PM

## 2011-10-25 ENCOUNTER — Inpatient Hospital Stay (HOSPITAL_COMMUNITY): Payer: 59

## 2011-10-25 LAB — BASIC METABOLIC PANEL
BUN: 42 mg/dL — ABNORMAL HIGH (ref 6–23)
GFR calc Af Amer: 58 mL/min — ABNORMAL LOW (ref >90)
GFR calc non Af Amer: 50 mL/min — ABNORMAL LOW (ref >90)
Potassium: 3.9 mEq/L (ref 3.5–5.1)
Sodium: 132 mEq/L — ABNORMAL LOW (ref 135–145)

## 2011-10-25 LAB — CBC
Hemoglobin: 9.9 g/dL — ABNORMAL LOW (ref 13.0–17.0)
MCHC: 34.3 g/dL (ref 30.0–36.0)

## 2011-10-25 MED ORDER — FILGRASTIM 480 MCG/1.6ML IJ SOLN
480.0000 ug | Freq: Every day | INTRAMUSCULAR | Status: DC
  Administered 2011-10-25: 480 ug via SUBCUTANEOUS
  Filled 2011-10-25 (×2): qty 1.6

## 2011-10-25 MED ORDER — METOCLOPRAMIDE HCL 5 MG/ML IJ SOLN
5.0000 mg | Freq: Four times a day (QID) | INTRAMUSCULAR | Status: DC
  Administered 2011-10-25 – 2011-10-27 (×9): 5 mg via INTRAVENOUS
  Filled 2011-10-25: qty 2
  Filled 2011-10-25: qty 1
  Filled 2011-10-25: qty 2
  Filled 2011-10-25 (×2): qty 1
  Filled 2011-10-25: qty 2
  Filled 2011-10-25 (×5): qty 1
  Filled 2011-10-25: qty 2

## 2011-10-25 NOTE — Progress Notes (Signed)
Spouse assisted pt with soap suds enema. Pt able to retain 500 ccs of enema. Held for 5 minutes with no results. Did not want to do any additional part of enema. Given Golytely to sip on at bedside

## 2011-10-25 NOTE — Progress Notes (Signed)
[facility] PROGRESS NOTE  Rodney Green ZOX:[id number] DOB: 01-10-1946 DOA: 10/20/2011 PCP: Katy Apo, MD  Brief narrative: Rodney Green is a 66 year old man with a PMH of relapsed multiple myeloma who was admitted 10/21/2011 with community-acquired pneumonia in the setting of recent chemotherapy and neutropenia. He was put on broad-spectrum antibiotics. Neupogen therapy has also been initiated.  Hospital course has been complicated by significant constipation.  Assessment/Plan: Principal Problem:  *Community acquired pneumonia in a patient with relapsed multiple myeloma  Admitted and put on broad-spectrum antibiotics consisting of imipenem and vancomycin.  Unfortunately Vancomycin ordered only x 1.  Re-ordered 10/24/11, fever curve down over past 24 hours.  Urine culture negative,  blood cultures negative to date.  CT chest done 10/24/11 to rule out empyema. Active Problems:  Constipation  No relief despite aggressive bowel regimen including a soap suds enema, and goLytely.  Check KUB.  Start Reglan.  Stage III chronic kidney disease  Likely related to myeloma.   Creatinine up since admission, may be due to some dehydration from diaphoretic episodes.  Improved with NS at 100 cc/hr and holding HCTZ/Avapro.  Urinary retention  Patient noted to have decreased urinary output and a bladder scan confirmed a 1000 cc of urine and the bladder.  Nurses attempted to catheterize x3 but were unsuccessful.  Patient was able to spontaneously void on his own 10/21/2011.  Continue Flomax.  Altered mental status  Patient noted to be extremely lethargic on 10/21/2011. Wife confirmed that this is significantly abnormal.    CT scan of the head was obtained 10/21/2011 and was unremarkable.  No complaints of headache or signs of meningismus.  Mental status improving.  Wife reports no delirium over past 24 hours.  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 Mild.  Monitor.  May need to consider demeclocycline if sodium continues to drop.  Multiple myeloma in relapse / normocytic anemia  Dr. Arbutus Ped saw the patient and initiated Neupogen therapy x 4 days for his neutropenia.  Will re-order Neupogen since WBC not fully recovered.  Check differential in a.m.  Pomalyst on hold.  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 Patient currently on Neupogen. Pomalyst on hold.  Diabetes mellitus, type 2  Continue Lantus 64 units daily and change SSI to resistant scale for better glycemic control.  Hypertension  Continue Norvasc, and atenolol.  Hepatitis C  Liver function studies within normal limits.  Hyperlipidemia  Continue statin.   Code Status: Full code. Family Communication: Wife updated at bedside. Disposition Plan: Home when stable.   Medical Consultants:  Dr. Si Gaul, Oncology.  Other Consultants:  Dietitian: No educational needs identified.  Procedures:  None.  Antibiotics:  Imipenem 10/20/2011--->  Vancomycin 10/20/2011 x 1 dose; Re-ordered 10/23/11--->  HPI/Subjective: Rodney Green is less lethargic, and has not had drenching sweats in the past 24 hours.  His wife reports no delirium in past 24 hours either.  He has not had a BM in 1 week.  Appetite poor.  No vomiting, but feels a bit nauseated.   Objective: Filed Vitals:   10/24/11 0621 10/24/11 1431 10/24/11 2204 10/25/11 0600  BP: 91/58 118/72 102/64 125/76  Pulse: 66 64 92 62  Temp: 98.5 F (36.9 C) 98.2 F (36.8 C) 99.8 F (37.7 C) 97.6 F (36.4 C)  TempSrc: Oral Oral Oral Oral  Resp: 18 18 18 20   Height:      Weight:      SpO2: 95% 97% 95% 99%  Intake/Output Summary (Last 24 hours) at 10/25/11 0954 Last data filed at 10/25/11 0439  Gross per 24 hour  Intake 1648.33 ml  Output      0 ml  Net 1648.33 ml    Exam: Gen:  NAD, awake but restless. Cardiovascular:  RRR, No M/R/G Respiratory: Lungs CTAB Gastrointestinal:  Abdomen soft, NT/ND with hyperactive bowel sounds. Extremities: No C/E/C  Data Reviewed: Basic Metabolic Panel:  Lab 10/25/11 1610 10/24/11 0515 10/23/11 0641 10/22/11 0400 10/21/11 0528  NA 132* 131* 129* 129* 131*  K 3.9 3.7 -- -- --  CL 98 97 96 95* 98  CO2 25 24 25 23 23   GLUCOSE 115* 160* 195* 186* 117*  BUN 42* 45* 37* 28* 29*  CREATININE 1.44* 1.97* 1.73* 1.59* 1.48*  CALCIUM 8.2* 8.2* 8.3* 8.5 8.7  MG -- -- -- -- --  PHOS -- -- -- -- --   GFR Estimated Creatinine Clearance: 55.2 ml/min (by C-G formula based on Cr of 1.44). Liver Function Tests:  Lab 10/21/11 0528 10/21/11 0035 10/20/11 1012  AST 12 13 10   ALT 11 12 13   ALKPHOS 84 95 101  BILITOT 0.7 0.6 1.10  PROT 5.9* 6.4 5.9*  ALBUMIN 2.8* 3.1* 3.0*    CBC:  Lab 10/25/11 0419 10/24/11 0515 10/23/11 0641 10/22/11 0400 10/21/11 0528  WBC 3.9* 3.4* 3.4* 2.8* 1.7*  NEUTROABS -- -- -- -- --  HGB 9.9* 9.8* 9.9* 10.6* 10.3*  HCT 28.9* 28.7* 29.3* 31.4* 30.4*  MCV 91.7 91.4 92.1 92.6 92.4  PLT 222 223 187 167 164   CBG:  Lab 10/24/11 2322 10/23/11 2136 10/23/11 1734 10/23/11 1255 10/23/11 0713  GLUCAP 114* 193* 153* 225* 226*   Microbiology Recent Results (from the past 240 hour(s))  CULTURE, BLOOD (ROUTINE X 2)     Status: Normal (Preliminary result)   Collection Time   10/21/11 12:35 AM      Component Value Range Status Comment   Specimen Description BLOOD RIGHT ANTECUBITAL   Final    Special Requests BOTTLES DRAWN AEROBIC AND ANAEROBIC 4CC   Final    Culture  Setup Time 10/21/2011 04:36   Final    Culture     Final    Value:        BLOOD CULTURE RECEIVED NO GROWTH TO DATE CULTURE WILL BE HELD FOR 5 DAYS BEFORE ISSUING A FINAL NEGATIVE REPORT   Report Status PENDING   Incomplete   CULTURE, BLOOD (ROUTINE X 2)     Status: Normal (Preliminary result)   Collection Time   10/21/11  1:21 AM      Component Value Range Status Comment   Specimen Description BLOOD NO SITE INDICATED   Final    Special Requests      Final    Value: BOTTLES DRAWN AEROBIC AND ANAEROBIC NO VOLUME INDICATED   Culture  Setup Time 10/21/2011 04:36   Final    Culture     Final    Value:        BLOOD CULTURE RECEIVED NO GROWTH TO DATE CULTURE WILL BE HELD FOR 5 DAYS BEFORE ISSUING A FINAL NEGATIVE REPORT   Report Status PENDING   Incomplete   URINE CULTURE     Status: Normal   Collection Time   10/21/11  9:08 AM      Component Value Range Status Comment   Specimen Description URINE, RANDOM   Final    Special Requests NONE   Final    Culture  Setup  Time 10/21/2011 14:12   Final    Colony Count NO GROWTH   Final    Culture NO GROWTH   Final    Report Status 10/22/2011 FINAL   Final      Studies:  Dg Chest 2 View 10/21/2011  IMPRESSION:  1.  Streaky right basilar airspace opacity could be due to atelectasis or pneumonia. 2.  Findings consistent with multiple myeloma.   Original Report Authenticated By: Bernadene Bell. Maricela Curet, M.D.     Ct Head Wo Contrast 10/21/2011  IMPRESSION: 1. Negative for bleed or other acute intracranial process.   Original Report Authenticated By: Thora Lance III, M.D.     Scheduled Meds:    . amLODipine  5 mg Oral Daily  . antiseptic oral rinse  15 mL Mouth Rinse q12n4p  . aspirin  81 mg Oral Daily  . atenolol  100 mg Oral Daily  . bisacodyl  5 mg Oral Daily  . chlorhexidine  15 mL Mouth Rinse BID  . docusate sodium  200 mg Oral Daily  . docusate sodium  300 mg Oral QHS  . filgrastim  480 mcg Subcutaneous Daily  . imipenem-cilastatin  500 mg Intravenous Q8H  . insulin aspart  0-20 Units Subcutaneous TID WC  . insulin aspart  0-5 Units Subcutaneous QHS  . insulin glargine  64 Units Subcutaneous QHS  . oxyCODONE  40 mg Oral Q12H  . senna-docusate  2 tablet Oral QHS  . simvastatin  10 mg Oral QHS  . sodium chloride  3 mL Intravenous Q12H  . sodium phosphate  1 enema Rectal Once  . Tamsulosin HCl  0.4 mg Oral QPC supper  . vancomycin  750 mg Intravenous Q12H  . DISCONTD:  hydrochlorothiazide  12.5 mg Oral Daily  . DISCONTD: irbesartan  150 mg Oral Daily   Continuous Infusions:    . sodium chloride 100 mL/hr at 10/25/11 0516    Time spent: 35 minutes.   LOS: 5 days   Mateya Torti  [facility] Pager [phone].  If 8PM-8AM, please contact night-coverage at [email or web address], password [facility] 10/25/2011, 9:54 AM              pr

## 2011-10-26 LAB — CBC WITH DIFFERENTIAL/PLATELET
Basophils Relative: 1 % (ref 0–1)
Eosinophils Absolute: 0.1 10*3/uL (ref 0.0–0.7)
Eosinophils Relative: 1 % (ref 0–5)
Hemoglobin: 9.6 g/dL — ABNORMAL LOW (ref 13.0–17.0)
Lymphocytes Relative: 10 % — ABNORMAL LOW (ref 12–46)
Neutrophils Relative %: 68 % (ref 43–77)
RBC: 3.15 MIL/uL — ABNORMAL LOW (ref 4.22–5.81)
WBC: 5.5 10*3/uL (ref 4.0–10.5)

## 2011-10-26 LAB — VANCOMYCIN, TROUGH: Vancomycin Tr: 14.1 ug/mL (ref 10.0–20.0)

## 2011-10-26 LAB — BASIC METABOLIC PANEL
CO2: 24 mEq/L (ref 19–32)
GFR calc non Af Amer: 58 mL/min — ABNORMAL LOW (ref >90)
Glucose, Bld: 110 mg/dL — ABNORMAL HIGH (ref 70–99)
Potassium: 4 mEq/L (ref 3.5–5.1)
Sodium: 136 mEq/L (ref 135–145)

## 2011-10-26 MED ORDER — VANCOMYCIN HCL IN DEXTROSE 1-5 GM/200ML-% IV SOLN
1000.0000 mg | Freq: Two times a day (BID) | INTRAVENOUS | Status: DC
  Administered 2011-10-26 – 2011-10-27 (×2): 1000 mg via INTRAVENOUS
  Filled 2011-10-26 (×3): qty 200

## 2011-10-26 NOTE — Progress Notes (Signed)
[facility] PROGRESS NOTE  Rodney Green ZOX:[id number] DOB: 01-May-1945 DOA: 10/20/2011 PCP: Katy Apo, MD  Brief narrative: Rodney Green is a 66 year old man with a PMH of relapsed multiple myeloma who was admitted 10/21/2011 with community-acquired pneumonia in the setting of recent chemotherapy and neutropenia. He was put on broad-spectrum antibiotics. Neupogen therapy has also been initiated.  Hospital course has been complicated by significant constipation.  Assessment/Plan: Principal Problem:  *Community acquired pneumonia in a patient with relapsed multiple myeloma  Admitted and put on broad-spectrum antibiotics consisting of imipenem and vancomycin.  Unfortunately Vancomycin ordered only x 1.  Re-ordered 10/24/11, fever curve down over past 24 hours.  Urine culture negative,  blood cultures negative to date.  CT chest done 10/24/11 to rule out empyema. Active Problems:  Constipation  No relief despite aggressive bowel regimen including a soap suds enema, and goLytely.  Bowels moved yesterday after starting Reglan.  Had another BM today.  Looks and feels better.  Stage III chronic kidney disease  Likely related to myeloma.   Creatinine up since admission, may be due to some dehydration from diaphoretic episodes.  Improved with NS at 100 cc/hr and holding HCTZ/Avapro.  Urinary retention  Patient noted to have decreased urinary output and a bladder scan confirmed a 1000 cc of urine and the bladder.  Nurses attempted to catheterize x3 but were unsuccessful.  Patient was able to spontaneously void on his own 10/21/2011.  Continue Flomax.  Altered mental status  Patient noted to be extremely lethargic on 10/21/2011. Wife confirmed that this is significantly abnormal.    CT scan of the head was obtained 10/21/2011 and was unremarkable.  No complaints of headache or signs of meningismus.  Mental status improving.  Wife reports no delirium over past 24 hours.  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 Mild. Monitor.  May need to consider demeclocycline if sodium continues to drop.  Multiple myeloma in relapse / normocytic anemia  Dr. Arbutus Ped saw the patient and initiated Neupogen therapy x 4 days for his neutropenia.  Will re-order Neupogen since WBC not fully recovered.  Check differential in a.m.  Pomalyst on hold.  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 Patient currently on Neupogen. Pomalyst on hold.  Diabetes mellitus, type 2  Continue Lantus 64 units daily and change Continue resistant scale SSI.  CBG [id number].  Hypertension  Continue Norvasc, and atenolol.  Hepatitis C  Liver function studies within normal limits.  Hyperlipidemia  Continue statin.   Code Status: Full code. Family Communication: Wife updated at bedside. Disposition Plan: Home when stable.   Medical Consultants:  Dr. Si Gaul, Oncology.  Other Consultants:  Dietitian: No educational needs identified.  Procedures:  None.  Antibiotics:  Imipenem 10/20/2011--->  Vancomycin 10/20/2011 x 1 dose; Re-ordered 10/23/11--->  HPI/Subjective: Rodney Green is less lethargic, and has not had drenching sweats in the past 24 hours.  His wife reports no delirium in past 24 hours either.  He has not had a BM in 1 week.  Appetite poor.  No vomiting, but feels a bit nauseated.   Objective: Filed Vitals:   10/25/11 1404 10/25/11 2129 10/26/11 0000 10/26/11 0557  BP: 118/74 143/76  125/86  Pulse: 62 74  75  Temp: 97.7 F (36.5 C) 100 F (37.8 C) 99.6 F (37.6 C) 99.5 F (37.5 C)  TempSrc: Oral Axillary Oral Oral  Resp: 18 18  18   Height:      Weight:  SpO2: 94% 93%  92%    Intake/Output Summary (Last 24 hours) at 10/26/11 1142 Last data filed at 10/26/11 0939  Gross per 24 hour  Intake   2815 ml  Output      0 ml  Net   2815 ml    Exam: Gen:  NAD, awake but restless. Cardiovascular:  RRR, No  M/R/G Respiratory: Lungs CTAB Gastrointestinal: Abdomen soft, NT/ND with hyperactive bowel sounds. Extremities: No C/E/C  Data Reviewed: Basic Metabolic Panel:  Lab 10/26/11 1308 10/25/11 0419 10/24/11 0515 10/23/11 0641 10/22/11 0400  NA 136 132* 131* 129* 129*  K 4.0 3.9 -- -- --  CL 104 98 97 96 95*  CO2 24 25 24 25 23   GLUCOSE 110* 115* 160* 195* 186*  BUN 32* 42* 45* 37* 28*  CREATININE 1.28 1.44* 1.97* 1.73* 1.59*  CALCIUM 8.1* 8.2* 8.2* 8.3* 8.5  MG -- -- -- -- --  PHOS -- -- -- -- --   GFR Estimated Creatinine Clearance: 62.1 ml/min (by C-G formula based on Cr of 1.28). Liver Function Tests:  Lab 10/21/11 0528 10/21/11 0035 10/20/11 1012  AST 12 13 10   ALT 11 12 13   ALKPHOS 84 95 101  BILITOT 0.7 0.6 1.10  PROT 5.9* 6.4 5.9*  ALBUMIN 2.8* 3.1* 3.0*    CBC:  Lab 10/26/11 0402 10/25/11 0419 10/24/11 0515 10/23/11 0641 10/22/11 0400  WBC 5.5 3.9* 3.4* 3.4* 2.8*  NEUTROABS 3.6 -- -- -- --  HGB 9.6* 9.9* 9.8* 9.9* 10.6*  HCT 29.0* 28.9* 28.7* 29.3* 31.4*  MCV 92.1 91.7 91.4 92.1 92.6  PLT 309 222 223 187 167   CBG:  Lab 10/25/11 2218 10/24/11 2322 10/23/11 2136 10/23/11 1734 10/23/11 1255  GLUCAP 213* 114* 193* 153* 225*   Microbiology Recent Results (from the past 240 hour(s))  CULTURE, BLOOD (ROUTINE X 2)     Status: Normal (Preliminary result)   Collection Time   10/21/11 12:35 AM      Component Value Range Status Comment   Specimen Description BLOOD RIGHT ANTECUBITAL   Final    Special Requests BOTTLES DRAWN AEROBIC AND ANAEROBIC 4CC   Final    Culture  Setup Time 10/21/2011 04:36   Final    Culture     Final    Value:        BLOOD CULTURE RECEIVED NO GROWTH TO DATE CULTURE WILL BE HELD FOR 5 DAYS BEFORE ISSUING A FINAL NEGATIVE REPORT   Report Status PENDING   Incomplete   CULTURE, BLOOD (ROUTINE X 2)     Status: Normal (Preliminary result)   Collection Time   10/21/11  1:21 AM      Component Value Range Status Comment   Specimen Description BLOOD  NO SITE INDICATED   Final    Special Requests     Final    Value: BOTTLES DRAWN AEROBIC AND ANAEROBIC NO VOLUME INDICATED   Culture  Setup Time 10/21/2011 04:36   Final    Culture     Final    Value:        BLOOD CULTURE RECEIVED NO GROWTH TO DATE CULTURE WILL BE HELD FOR 5 DAYS BEFORE ISSUING A FINAL NEGATIVE REPORT   Report Status PENDING   Incomplete   URINE CULTURE     Status: Normal   Collection Time   10/21/11  9:08 AM      Component Value Range Status Comment   Specimen Description URINE, RANDOM   Final  Special Requests NONE   Final    Culture  Setup Time 10/21/2011 14:12   Final    Colony Count NO GROWTH   Final    Culture NO GROWTH   Final    Report Status 10/22/2011 FINAL   Final      Studies:  Dg Chest 2 View 10/21/2011  IMPRESSION:  1.  Streaky right basilar airspace opacity could be due to atelectasis or pneumonia. 2.  Findings consistent with multiple myeloma.   Original Report Authenticated By: Bernadene Bell. Maricela Curet, M.D.     Ct Head Wo Contrast 10/21/2011  IMPRESSION: 1. Negative for bleed or other acute intracranial process.   Original Report Authenticated By: Thora Lance III, M.D.     Scheduled Meds:    . amLODipine  5 mg Oral Daily  . antiseptic oral rinse  15 mL Mouth Rinse q12n4p  . aspirin  81 mg Oral Daily  . atenolol  100 mg Oral Daily  . bisacodyl  5 mg Oral Daily  . chlorhexidine  15 mL Mouth Rinse BID  . docusate sodium  200 mg Oral Daily  . docusate sodium  300 mg Oral QHS  . imipenem-cilastatin  500 mg Intravenous Q8H  . insulin aspart  0-20 Units Subcutaneous TID WC  . insulin aspart  0-5 Units Subcutaneous QHS  . insulin glargine  64 Units Subcutaneous QHS  . metoCLOPramide (REGLAN) injection  5 mg Intravenous Q6H  . oxyCODONE  40 mg Oral Q12H  . senna-docusate  2 tablet Oral QHS  . simvastatin  10 mg Oral QHS  . sodium chloride  3 mL Intravenous Q12H  . Tamsulosin HCl  0.4 mg Oral QPC supper  . vancomycin  750 mg Intravenous Q12H   . DISCONTD: filgrastim  480 mcg Subcutaneous q1800   Continuous Infusions:    . sodium chloride 100 mL/hr at 10/26/11 0509    Time spent: 35 minutes.   LOS: 6 days   Lion Fernandez  [facility] Pager [phone].  If 8PM-8AM, please contact night-coverage at [email or web address], password [facility] 10/26/2011, 11:42 AM              pr

## 2011-10-26 NOTE — Progress Notes (Addendum)
ANTIBIOTIC CONSULT NOTE - follow up  Pharmacy Consult for Vancomycin Indication: Febrile neutropenia  Allergies  Allergen Reactions  . Ace Inhibitors     He is not sure about allergy to ace inhibitors   Patient Measurements: Height: 5\' 7"  (170.2 cm) Weight: 190 lb 14.7 oz (86.6 kg) IBW/kg (Calculated) : 66.1   Vital Signs: Temp: 99.5 F (37.5 C) (09/02 0557) Temp src: Oral (09/02 0557) BP: 125/86 mmHg (09/02 0557) Pulse Rate: 75  (09/02 0557) Intake/Output from previous day: 09/01 0701 - 09/02 0700 In: 2575 [P.O.:240; I.V.:2335] Out: -  Intake/Output from this shift:   Labs:  Basename 10/26/11 0402 10/25/11 0419 10/24/11 0515  WBC 5.5 3.9* 3.4*  HGB 9.6* 9.9* 9.8*  PLT 309 222 223  LABCREA -- -- --  CREATININE 1.28 1.44* 1.97*   Estimated Creatinine Clearance: 62.1 ml/min (by C-G formula based on Cr of 1.28).  59 ml/min/1.36m2 (normalized)  Basename 10/24/11 2100  VANCOTROUGH 13.7  VANCOPEAK --  VANCORANDOM --  GENTTROUGH --  GENTPEAK --  GENTRANDOM --  TOBRATROUGH --  TOBRAPEAK --  TOBRARND --  AMIKACINPEAK --  AMIKACINTROU --  AMIKACIN --     Microbiology: Recent Results (from the past 720 hour(s))  CULTURE, BLOOD (ROUTINE X 2)     Status: Normal (Preliminary result)   Collection Time   10/21/11 12:35 AM      Component Value Range Status Comment   Specimen Description BLOOD RIGHT ANTECUBITAL   Final    Special Requests BOTTLES DRAWN AEROBIC AND ANAEROBIC 4CC   Final    Culture  Setup Time 10/21/2011 04:36   Final    Culture     Final    Value:        BLOOD CULTURE RECEIVED NO GROWTH TO DATE CULTURE WILL BE HELD FOR 5 DAYS BEFORE ISSUING A FINAL NEGATIVE REPORT   Report Status PENDING   Incomplete   CULTURE, BLOOD (ROUTINE X 2)     Status: Normal (Preliminary result)   Collection Time   10/21/11  1:21 AM      Component Value Range Status Comment   Specimen Description BLOOD NO SITE INDICATED   Final    Special Requests     Final    Value: BOTTLES  DRAWN AEROBIC AND ANAEROBIC NO VOLUME INDICATED   Culture  Setup Time 10/21/2011 04:36   Final    Culture     Final    Value:        BLOOD CULTURE RECEIVED NO GROWTH TO DATE CULTURE WILL BE HELD FOR 5 DAYS BEFORE ISSUING A FINAL NEGATIVE REPORT   Report Status PENDING   Incomplete   URINE CULTURE     Status: Normal   Collection Time   10/21/11  9:08 AM      Component Value Range Status Comment   Specimen Description URINE, RANDOM   Final    Special Requests NONE   Final    Culture  Setup Time 10/21/2011 14:12   Final    Colony Count NO GROWTH   Final    Culture NO GROWTH   Final    Report Status 10/22/2011 FINAL   Final     Medications:  Anti-infectives     Start     Dose/Rate Route Frequency Ordered Stop   10/23/11 0900   vancomycin (VANCOCIN) 750 mg in sodium chloride 0.9 % 150 mL IVPB        750 mg 150 mL/hr over 60 Minutes Intravenous Every 12  hours 10/23/11 0829     10/21/11 0600   imipenem-cilastatin (PRIMAXIN) 500 mg in sodium chloride 0.9 % 100 mL IVPB        500 mg 200 mL/hr over 30 Minutes Intravenous 3 times per day 10/21/11 0447     10/21/11 0200   vancomycin (VANCOCIN) IVPB 1000 mg/200 mL premix        1,000 mg 200 mL/hr over 60 Minutes Intravenous  Once 10/21/11 0155 10/21/11 0322   10/21/11 0200   piperacillin-tazobactam (ZOSYN) IVPB 3.375 g  Status:  Discontinued        3.375 g 12.5 mL/hr over 240 Minutes Intravenous  Once 10/21/11 0155 10/21/11 0447         Assessment:  66 yo male admitted with CAP; hx of relapsed MM, recent chemo, febrile neutropenia.  D# 4 Vanc 750mg  IV q12h. D# 5 Primaxin 500mg  IV q8h  Scr now improved with hydration and holding ARB/HCTZ  Bcx x 2 and Ucx no growth. WBC improved s/p Neupogen  Vancomycin trough slightly below goal (13.7) on 8/31, but accumulation was expected with rising Scr  Goal of Therapy:  Vancomycin trough level 15-20 mcg/ml  Plan:   Recheck Vancomycin trough tonight  Continue Primaxin as ordered per  MD  Follow up renal function, cultures & duration of therapy  Loralee Pacas, PharmD, BCPS Pager: [phone] 10/26/2011 9:36 AM    ADDENDUM: Vanco trough subtherapeutic (14.1). Current dose is 750mg  IV q12h. SCr trending down. Will increase dose to Vanco 1g IV q12h and recheck trough at new steady state.  Darrol Angel, PharmD Pager: [phone] 10/26/2011 9:08 PM

## 2011-10-27 DIAGNOSIS — A419 Sepsis, unspecified organism: Secondary | ICD-10-CM | POA: Diagnosis present

## 2011-10-27 LAB — GLUCOSE, CAPILLARY
Glucose-Capillary: 142 mg/dL — ABNORMAL HIGH (ref 70–99)
Glucose-Capillary: 201 mg/dL — ABNORMAL HIGH (ref 70–99)
Glucose-Capillary: 86 mg/dL (ref 70–99)
Glucose-Capillary: 101 mg/dL — ABNORMAL HIGH (ref 70–99)
Glucose-Capillary: 73 mg/dL (ref 70–99)
Glucose-Capillary: 72 mg/dL (ref 70–99)
Glucose-Capillary: 164 mg/dL — ABNORMAL HIGH (ref 70–99)

## 2011-10-27 LAB — CULTURE, BLOOD (ROUTINE X 2)

## 2011-10-27 LAB — CBC
MCV: 92.9 fL (ref 78.0–100.0)
Platelets: 343 10*3/uL (ref 150–400)
RBC: 3.25 MIL/uL — ABNORMAL LOW (ref 4.22–5.81)
WBC: 9.2 10*3/uL (ref 4.0–10.5)

## 2011-10-27 LAB — BASIC METABOLIC PANEL
CO2: 23 mEq/L (ref 19–32)
Calcium: 8.4 mg/dL (ref 8.4–10.5)
GFR calc non Af Amer: 68 mL/min — ABNORMAL LOW (ref >90)
Sodium: 139 mEq/L (ref 135–145)

## 2011-10-27 MED ORDER — LEVOFLOXACIN 750 MG PO TABS: 750.0000 mg | ORAL_TABLET | Freq: Every day | ORAL | Status: AC

## 2011-10-27 MED ORDER — METOCLOPRAMIDE HCL 5 MG PO TABS: 5.0000 mg | ORAL_TABLET | Freq: Four times a day (QID) | ORAL | Status: DC

## 2011-10-27 MED ORDER — ACETAMINOPHEN 325 MG PO TABS: 650.0000 mg | ORAL_TABLET | Freq: Four times a day (QID) | ORAL | Status: DC | PRN

## 2011-10-27 MED ORDER — TAMSULOSIN HCL 0.4 MG PO CAPS: 0.4000 mg | ORAL_CAPSULE | Freq: Every day | ORAL | Status: DC

## 2011-10-27 NOTE — Discharge Summary (Addendum)
Physician Discharge Summary  Rodney Green Mckinnie ZOX:[id number] DOB: 28-Mar-1945 DOA: 10/20/2011  PCP: Katy Apo, MD  Admit date: 10/20/2011 Discharge date: 10/27/2011  Recommendations for Outpatient Follow-up:  1.  No specific recommendations, F/U with PCP PRN and with Dr. Arbutus Ped at scheduled appointment 11/17/11.  Discharge Diagnoses:   Principal Problem:  *Sepsis secondary to community acquired pneumonia  Active Problems:   Multiple myeloma    Acquired immunocompromised state   Diabetes mellitus, type 2   Hypertension   Hepatitis C   CKD (chronic kidney disease) stage 3, GFR 30-59 ml/min   Hyperlipidemia   Normocytic anemia   Hyponatremia   Altered mental status   Urinary retention   Stage III chronic kidney disease   Discharge Condition: Improved.  Diet recommendation: Low sodium, heart healthy, carbohydrate modified.  History of present illness:  [name] is a 66 year old man with a PMH of relapsed multiple myeloma who was admitted 10/21/2011 with community-acquired pneumonia in the setting of recent chemotherapy and neutropenia.  Hospital Course by problem:  Principal Problem:  *Sepsis secondary to Community acquired pneumonia in a patient with relapsed multiple myeloma  Admitted and put on broad-spectrum antibiotics consisting of imipenem and vancomycin. Unfortunately Vancomycin ordered only x 1. Re-ordered 10/24/11, with subsequent resolution of fever.  Urine culture negative, blood cultures negative to date.  CT chest done 10/24/11 to rule out empyema.  Findings at noted below. D/C home on an additional 5 days of PO Levaquin. Active Problems:  Constipation  No initial relief despite aggressive bowel regimen including a soap suds enema, and goLytely.  Bowels moved after starting Reglan. D/C home on reglan therapy. Stage III chronic kidney disease  Likely related to myeloma.  Creatinine up since admission, may be due to some dehydration from  diaphoretic episodes. Improved with NS at 100 cc/hr and holding HCTZ/Avapro. Creatinine now better than usual baseline values. Urinary retention  Patient noted to have decreased urinary output and a bladder scan confirmed a 1000 cc of urine and the bladder.  Nurses attempted to catheterize x3 but were unsuccessful.  Patient was able to spontaneously void on his own 10/21/2011.  Continue Flomax. Altered mental status  Patient noted to be extremely lethargic on 10/21/2011. Wife confirmed that this is significantly abnormal.  CT scan of the head was obtained 10/21/2011 and was unremarkable. No complaints of headache or signs of meningismus.  Mental status back to normal at discharge. 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 Mild. Monitor.  May need to consider demeclocycline if sodium continues to drop. Multiple myeloma in relapse / normocytic anemia  Dr. Arbutus Ped saw the patient and initiated Neupogen therapy x 4 days for his neutropenia. We added 1 additional day given ongoing suppression of WBC.  Pomalyst on hold. 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 Patient is status post Neupogen. Pomalyst on hold. Diabetes mellitus, type 2  Maintained on Lantus 64 units daily and resistant scale SSI while in the hospital.   Resume home regimen at discharge. Hypertension  Maintained on Norvasc, and atenolol. (ACE/HCTZ held secondary to renal failure). Resume home regimen at discharge. Hepatitis C  Liver function studies within normal limits. Hyperlipidemia  Continue statin.  __________________________________________________________________________________   Medical Consultants:  Dr. Si Gaul, Oncology. Other Consultants:  Dietitian: No educational needs identified. Procedures:  None.  Discharge Exam: Filed Vitals:   10/27/11 0657  BP: 165/91  Pulse: 76  Temp: 98.6 F (37 C)  Resp: 18   Filed Vitals:  10/26/11 1342 10/26/11 2139  10/27/11 0005 10/27/11 0657  BP: 137/82 156/80  165/91  Pulse: 70 98  76  Temp: 97.8 F (36.6 C) 100.1 F (37.8 C) 98.2 F (36.8 C) 98.6 F (37 C)  TempSrc: Oral Oral Oral Oral  Resp: 18 18  18   Height:      Weight:      SpO2: 95% 93%  96%    Gen:  NAD Cardiovascular:  RRR, No M/R/G Respiratory: Lungs CTAB except for some crackles, diminished sounds at right base Gastrointestinal: Abdomen soft, NT/ND with normal active bowel sounds. Extremities: No C/E/C   Discharge Instructions  Discharge Orders    Future Appointments: Provider: Department: Dept Phone: Center:   11/10/2011 11:15 AM Windell Hummingbird [facility] [phone] None   11/17/2011 11:15 AM Si Gaul, MD [facility] [phone] None     Future Orders Please Complete By Expires   Diet - low sodium heart healthy      Diet Carb Modified      Increase activity slowly      Dan Humphreys       Call MD for:  temperature >100.4      Call MD for:      Scheduling Instructions:   If pain in right side worsens/intensifies.     Medication List  As of 10/27/2011  1:09 PM   STOP taking these medications         pomalidomide 4 MG capsule         TAKE these medications         acetaminophen 325 MG tablet   Commonly known as: TYLENOL   Take 2 tablets (650 mg total) by mouth every 6 (six) hours as needed.      amLODipine 5 MG tablet   Commonly known as: NORVASC   Take 5 mg by mouth daily.      aspirin 81 MG tablet   Take 81 mg by mouth daily.      atenolol 100 MG tablet   Commonly known as: TENORMIN   Take 100 mg by mouth daily.      dexamethasone 4 MG tablet   Commonly known as: DECADRON   Take 40 mg by mouth once a week. On tuesdays      docusate sodium 100 MG capsule   Commonly known as: COLACE   Take 200-300 mg by mouth 2 (two) times daily. Takes 2 capsules(200mg ) every morning and 3 capsules(300mg ) every night      insulin glargine 100 UNIT/ML injection   Commonly known as: LANTUS    Inject 64 Units into the skin at bedtime.      levofloxacin 750 MG tablet   Commonly known as: LEVAQUIN   Take 1 tablet (750 mg total) by mouth daily.      metoCLOPramide 5 MG tablet   Commonly known as: REGLAN   Take 1 tablet (5 mg total) by mouth 4 (four) times daily.      NOVOLOG MIX 70/30 (70-30) 100 UNIT/ML injection   Generic drug: insulin aspart protamine-insulin aspart   Inject 2-28 Units into the skin once a week. Per sliding scale.Marland KitchenMarland KitchenOnly takes morning after taking dexamethasone      oxyCODONE 40 MG 12 hr tablet   Commonly known as: OXYCONTIN   Take 1 tablet (40 mg total) by mouth every 12 (twelve) hours.      simvastatin 10 MG tablet   Commonly known as: ZOCOR   Take 10 mg by mouth at bedtime.  Tamsulosin HCl 0.4 MG Caps   Commonly known as: FLOMAX   Take 1 capsule (0.4 mg total) by mouth daily after supper.      valsartan-hydrochlorothiazide 160-12.5 MG per tablet   Commonly known as: DIOVAN-HCT   Take 1 tablet by mouth daily.           Follow-up Information    Follow up with POLITE,RONALD D, MD. (As needed)    Contact information:   [address] [place] [address] [phone]       Schedule an appointment as soon as possible for a visit with Lajuana Matte., MD. (Keep your scheduled appt. time.)    Contact information:   [address] [place] [address] [phone]           The results of significant diagnostics from this hospitalization (including imaging, microbiology, ancillary and laboratory) are listed below for reference.    Significant Diagnostic Studies: Dg Chest 2 View  10/21/2011  *RADIOLOGY REPORT*  Clinical Data: Fever.  Multiple myeloma.  CHEST - 2 VIEW  Comparison: Plain film of the chest 12/31/2010.  Findings: Streaky opacity is present in the right lung base.  Left lung is clear.  No pneumothorax or effusion.  Heart size is normal. Multiple thoracic and lumbar compression  fracture deformities are seen.  Punctate lytic lesions are present in the clavicles, proximal humeri and scapulae consistent history multiple myeloma. Remote right rib fractures are also noted.  IMPRESSION:  1.  Streaky right basilar airspace opacity could be due to atelectasis or pneumonia. 2.  Findings consistent with multiple myeloma.   Original Report Authenticated By: Bernadene Bell. D'ALESSIO, M.D.    Dg Abd 1 View  10/25/2011  *RADIOLOGY REPORT*  Clinical Data: Abdominal distention and constipation.  ABDOMEN - 1 VIEW  Comparison: Kyphoplasty and vertebroplasty images dated 04/05/2008.  Findings: Normal bowel gas pattern.  Minimal stool in the colon. Vertebroplasty material in multiple lumbar vertebral bodies. Calcific density is again overlying both upper sacroiliac regions. Previously, on the right, this had an appearance suggesting an exostosis and on the left had an appearance suggesting degenerative hypertrophy.  IMPRESSION: No acute abnormality.   Original Report Authenticated By: Darrol Angel, M.D.    Ct Head Wo Contrast  10/21/2011  *RADIOLOGY REPORT*  Clinical Data: Multiple myeloma, mental status change  CT HEAD WITHOUT CONTRAST  Technique:  Contiguous axial images were obtained from the base of the skull through the vertex without contrast.  Comparison: 01/13/2008  Findings: Atherosclerotic and physiologic intracranial calcifications.  Innumerable calvarial lytic lesions, largely stable compared the prior study.  Mild atrophy. There is no evidence of acute intracranial hemorrhage, brain edema, mass lesion, acute infarction,   mass effect, or midline shift. Acute infarct may be inapparent on noncontrast CT.  No other intra-axial abnormalities are seen, and the ventricles and sulci are within normal limits in size and symmetry.   No abnormal extra-axial fluid collections or masses are identified.  IMPRESSION: 1. Negative for bleed or other acute intracranial process.   Original Report Authenticated  By: Osa Craver, M.D.    Ct Chest Wo Contrast  10/24/2011  *RADIOLOGY REPORT*  Clinical Data: Fever and pneumonia.  Rule out empyema.  CT CHEST WITHOUT CONTRAST  Technique:  Multidetector CT imaging of the chest was performed following the standard protocol without IV contrast.  Comparison: Plain film 10/21/2011.  No prior CT.  Findings: Lung windows demonstrate collapse / consolidative change with air  bronchograms involving the right middle lobe on image 29. More patchy right lower lobe airspace disease, including dependently on image 35.  Patchy ground-glass left upper lobe opacities.  A left upper lobe soft tissue density nodule measures 9 mm on image 17 but has a suggestion of surrounding ground-glass opacity.  Soft tissue windows demonstrate bovine arch.  Tortuous descending thoracic aorta.  Mild cardiomegaly with dense multivessel coronary artery atherosclerosis.  No pericardial effusion or left-sided pleural effusion.  There is a small right-sided pleural effusion.  This has minimal loculation superiorly on image 15.  There is no air within the pleural space.  Subcarinal nodes are upper normal at up to 11 mm.  Hilar regions poorly evaluated secondary lack of IV contrast.  Limited abdominal imaging demonstrates probable gallstones. Normal adrenal glands.  Suggestion of pelviectasis involving the upper pole right kidney.  Image 55.  Incompletely imaged. Low density upper pole right renal lesion which is most likely a cyst.  Osteopenia.  Innumerable lytic lesions throughout the marrow space. There is likely a healed fracture of the right eleventh rib posteriorly. Remote second anterior rib remote fracture as well.  A lipoma within the right latissimus musculature of the 2.4 cm. Multiple thoracic compression deformities. Mild canal encroachment at numerous levels.  A L1 vertebral plasty has been performed.  IMPRESSION:  1.  Multifocal right greater than left airspace and ground-glass opacities, most  consistent with infection. 2.  Small right-sided pleural effusion with minimal loculation posteriorly.  Decrease sensitivity for empyema, secondary lack of IV contrast.  Given this factor, no specific evidence of empyema identified. 3.  A left upper lobe pulmonary nodule which measures 9 mm.  This could also be infectious, given the suggestion of surrounding inflammation.  However, follow-up with chest CT at 3 months is recommended to confirm resolution.  These results will be called to the ordering clinician or representative by the Radiologist Assistant, and communication documented in the PACS Dashboard. 4. Cannot exclude upper pole right-sided renal pelviectasis. Incompletely imaged.  Consider renal ultrasound. 5.  Findings of multiple myeloma with numerous complicating fractures, as above.   Original Report Authenticated By: Consuello Bossier, M.D.     Microbiology: Recent Results (from the past 240 hour(s))  CULTURE, BLOOD (ROUTINE X 2)     Status: Normal   Collection Time   10/21/11 12:35 AM      Component Value Range Status Comment   Specimen Description BLOOD RIGHT ANTECUBITAL   Final    Special Requests BOTTLES DRAWN AEROBIC AND ANAEROBIC 4CC   Final    Culture  Setup Time 10/21/2011 04:36   Final    Culture NO GROWTH 5 DAYS   Final    Report Status 10/27/2011 FINAL   Final   CULTURE, BLOOD (ROUTINE X 2)     Status: Normal   Collection Time   10/21/11  1:21 AM      Component Value Range Status Comment   Specimen Description BLOOD NO SITE INDICATED   Final    Special Requests     Final    Value: BOTTLES DRAWN AEROBIC AND ANAEROBIC NO VOLUME INDICATED   Culture  Setup Time 10/21/2011 04:36   Final    Culture NO GROWTH 5 DAYS   Final    Report Status 10/27/2011 FINAL   Final   URINE CULTURE     Status: Normal   Collection Time   10/21/11  9:08 AM      Component Value Range Status Comment  Specimen Description URINE, RANDOM   Final    Special Requests NONE   Final    Culture  Setup  Time 10/21/2011 14:12   Final    Colony Count NO GROWTH   Final    Culture NO GROWTH   Final    Report Status 10/22/2011 FINAL   Final      Labs: Basic Metabolic Panel:  Lab 10/27/11 4098 10/26/11 0402 10/25/11 0419 10/24/11 0515 10/23/11 0641  NA 139 136 132* 131* 129*  K 3.7 4.0 3.9 3.7 4.1  CL 105 104 98 97 96  CO2 23 24 25 24 25   GLUCOSE 157* 110* 115* 160* 195*  BUN 22 32* 42* 45* 37*  CREATININE 1.12 1.28 1.44* 1.97* 1.73*  CALCIUM 8.4 8.1* 8.2* 8.2* 8.3*  MG -- -- -- -- --  PHOS -- -- -- -- --   Liver Function Tests:  Lab 10/21/11 0528 10/21/11 0035  AST 12 13  ALT 11 12  ALKPHOS 84 95  BILITOT 0.7 0.6  PROT 5.9* 6.4  ALBUMIN 2.8* 3.1*   CBC:  Lab 10/27/11 0335 10/26/11 0402 10/25/11 0419 10/24/11 0515 10/23/11 0641  WBC 9.2 5.5 3.9* 3.4* 3.4*  NEUTROABS -- 3.6 -- -- --  HGB 9.8* 9.6* 9.9* 9.8* 9.9*  HCT 30.2* 29.0* 28.9* 28.7* 29.3*  MCV 92.9 92.1 91.7 91.4 92.1  PLT 343 309 222 223 187   CBG:  Lab 10/25/11 2218 10/24/11 2322 10/23/11 2136 10/23/11 1734 10/23/11 1255  GLUCAP 213* 114* 193* 153* 225*    Time coordinating discharge: 45 minutes.  Signed:  Raquon Milledge  Pager [phone] [facility] 10/27/2011, 1:09 PM

## 2011-10-27 NOTE — Progress Notes (Signed)
Family at bedside. Spouse reports pt has complained of right upper chest pain. RN spoke with pt. Pt reports pain in right lateral chest wall with movement and deep breaths. Reports he does not have any pain at rest. Just with movement. Denies SOB. Pt calm without signs of diaphoresis. Denies any needs at present. Told to notify RN with any changes.

## 2011-10-27 NOTE — Care Management Note (Unsigned)
    Page 1 of 1   10/27/2011     11:54:08 AM   CARE MANAGEMENT NOTE 10/27/2011  Patient:  Rodney Green, Rodney Green   Account Number:  [id number]  Date Initiated:  10/27/2011  Documentation initiated by:  CRAFT,TERRI  Subjective/Objective Assessment:   66 yo male admitted 10/20/11 with fever     Action/Plan:   D/C when medically stable   Anticipated DC Date:  10/30/2011   Anticipated DC Plan:  HOME/SELF CARE      DC Planning Services  CM consult             Status of service:  In process, will continue to follow  Per UR Regulation:  Reviewed for med. necessity/level of care/duration of stay   Comments:  10/27/11, Kathi Der RNC-MNN, BSN, [phone], CM received referral.  CM met with pt and his wife, Bridgette Habermann, to discuss home health services.  Home Health discussed in detail and all questions answered.  Pt and his wife do not feel home health services will be needed at this time. Pts wife asked about someone preparing meals in the home.  Explained this would require Ship broker.  Offered list of [employer], but this was declined.  CM will continue to follow.

## 2011-10-27 NOTE — Progress Notes (Signed)
F/U and D/C instructions to pt and wife.  Each verbalize understanding.  Pt out of dept via w/c.  dph

## 2011-10-28 ENCOUNTER — Telehealth: Payer: Self-pay | Admitting: *Deleted

## 2011-10-28 NOTE — Telephone Encounter (Signed)
Anne from [facility] called to see if pt's rx for pomalyst is on hold.  Per Dr Donnald Garre, will continue for pomalyst to be on hold and will reassess at next f/u visit.  Left msg with pt's wife.  Will need to call [facility] again to r/s pomalyst.  SLJ

## 2011-11-10 ENCOUNTER — Other Ambulatory Visit (HOSPITAL_BASED_OUTPATIENT_CLINIC_OR_DEPARTMENT_OTHER): Payer: 59 | Admitting: Lab

## 2011-11-10 DIAGNOSIS — C9002 Multiple myeloma in relapse: Secondary | ICD-10-CM

## 2011-11-10 LAB — COMPREHENSIVE METABOLIC PANEL (CC13)
ALT: 13 U/L (ref 0–55)
AST: 17 U/L (ref 5–34)
Alkaline Phosphatase: 134 U/L (ref 40–150)
BUN: 23 mg/dL (ref 7.0–26.0)
Calcium: 9.5 mg/dL (ref 8.4–10.4)
Creatinine: 1.4 mg/dL — ABNORMAL HIGH (ref 0.7–1.3)
Total Bilirubin: 0.4 mg/dL (ref 0.20–1.20)

## 2011-11-10 LAB — CBC WITH DIFFERENTIAL/PLATELET
Basophils Absolute: 0.1 10*3/uL (ref 0.0–0.1)
EOS%: 5.2 % (ref 0.0–7.0)
Eosinophils Absolute: 0.2 10*3/uL (ref 0.0–0.5)
HCT: 31.9 % — ABNORMAL LOW (ref 38.4–49.9)
HGB: 10.7 g/dL — ABNORMAL LOW (ref 13.0–17.1)
MCH: 31.9 pg (ref 27.2–33.4)
NEUT#: 1.7 10*3/uL (ref 1.5–6.5)
NEUT%: 51.1 % (ref 39.0–75.0)
RDW: 16.4 % — ABNORMAL HIGH (ref 11.0–14.6)
lymph#: 0.9 10*3/uL (ref 0.9–3.3)

## 2011-11-11 ENCOUNTER — Other Ambulatory Visit: Payer: Self-pay | Admitting: Medical Oncology

## 2011-11-11 DIAGNOSIS — R52 Pain, unspecified: Secondary | ICD-10-CM

## 2011-11-11 MED ORDER — OXYCODONE HCL 40 MG PO TB12: 40.0000 mg | ORAL_TABLET | Freq: Two times a day (BID) | ORAL | Status: DC

## 2011-11-11 NOTE — Telephone Encounter (Signed)
rx locked in injection room for pickup. 

## 2011-11-12 LAB — SPEP & IFE WITH QIG
Albumin ELP: 45.5 % — ABNORMAL LOW (ref 55.8–66.1)
Beta 2: 5.6 % (ref 3.2–6.5)
Beta Globulin: 5.2 % (ref 4.7–7.2)
Gamma Globulin: 21 % — ABNORMAL HIGH (ref 11.1–18.8)
IgA: 672 mg/dL — ABNORMAL HIGH (ref 68–379)
IgG (Immunoglobin G), Serum: 1380 mg/dL (ref 650–1600)
IgM, Serum: 47 mg/dL (ref 41–251)
M-Spike, %: 0.39 g/dL

## 2011-11-12 LAB — KAPPA/LAMBDA LIGHT CHAINS
Kappa free light chain: 3.69 mg/dL — ABNORMAL HIGH (ref 0.33–1.94)
Kappa:Lambda Ratio: 0.13 — ABNORMAL LOW (ref 0.26–1.65)
Lambda Free Lght Chn: 28.1 mg/dL — ABNORMAL HIGH (ref 0.57–2.63)

## 2011-11-17 ENCOUNTER — Ambulatory Visit (HOSPITAL_BASED_OUTPATIENT_CLINIC_OR_DEPARTMENT_OTHER): Payer: 59 | Admitting: Internal Medicine

## 2011-11-17 ENCOUNTER — Other Ambulatory Visit: Payer: Self-pay | Admitting: Medical Oncology

## 2011-11-17 ENCOUNTER — Telehealth: Payer: Self-pay | Admitting: Internal Medicine

## 2011-11-17 VITALS — BP 150/97 | HR 77 | Temp 97.3°F | Resp 20 | Ht 67.0 in | Wt 195.3 lb

## 2011-11-17 DIAGNOSIS — C9000 Multiple myeloma not having achieved remission: Secondary | ICD-10-CM

## 2011-11-17 DIAGNOSIS — C9002 Multiple myeloma in relapse: Secondary | ICD-10-CM

## 2011-11-17 NOTE — Progress Notes (Signed)
.  I spoke to pts wife and Prospero has not done survey for [employer] so I told her to tell him he needs to do it so we can order drug.

## 2011-11-17 NOTE — Progress Notes (Signed)
[facility] [phone]   [phone]  OFFICE PROGRESS NOTE  Katy Apo, MD [address] [place] [address]  DIAGNOSIS:  Multiple myeloma IgA subtype diagnosed in November, 2009.   PRIOR THERAPY:  1. Status post seven cycles of systemic chemotherapy with Revlimid and low-dose Betatron with partial response. The last dose was given June, 2010. 2. Status post chemotherapy with weekly Velcade in a patient on Decadron 40 mg on a weekly basis, status post 75 weekly doses. The last dose was given May 04, 2008. 3. Status post treatment again with Velcade and Decadron. The patient was awaiting stem cell transplant at [facility]. Last dose was given Jul 16, 2009. 4. The patient was not considered a good candidate for autologous stem cell transplant at that time secondary to poor mobilization of his stem cells.  5. Status post six cycles of systemic chemotherapy with Velcade, Cytoxan and Betatron. Last dose was given March 25, 2010. This continued secondary to his disease progression. 6. Status post ten cycles of systemic chemotherapy with Velcade. Doxil and Decadron. Last dose was given on 10/31/2010. Currently on hold because of concern about cardiac toxicity.  CURRENT THERAPY:  1. Pomalyst 4 mg by mouth daily for 21 days every 28 days, completed cycle #5. 2. Decadron 40 mg by mouth weekly   INTERVAL HISTORY: Rodney Green 66 y.o. male returns to the clinic today for followup visit accompanied by his wife. The patient is feeling fine today with no specific complaints. He denied having any significant weight loss or night sweats. He denied having any chest pain, shortness breath, cough or hemoptysis. He still has mild fatigue. Unfortunately his older sister who was also diagnosed with multiple myeloma passed away recently and the patient is a little bit anxious about his current condition. He has been off  Pomalyst for at least 4 weeks now after admission to the hospital with community acquired pneumonia.  He felt much better after his discharge and has been doing well for the last 2-3 weeks. He has repeat myeloma panel performed recently and he is here for evaluation and discussion of his lab results.   MEDICAL HISTORY: Past Medical History  Diagnosis Date  . Allergy   . Diabetes mellitus   . Hypertension   . Hepatitis C   . Multiple myeloma   . Chronic kidney disease     ALLERGIES:  is allergic to ace inhibitors.  MEDICATIONS:  Current Outpatient Prescriptions  Medication Sig Dispense Refill  . acetaminophen (TYLENOL) 325 MG tablet Take 2 tablets (650 mg total) by mouth every 6 (six) hours as needed.      Marland Kitchen amLODipine (NORVASC) 5 MG tablet Take 5 mg by mouth daily.        Marland Kitchen aspirin 81 MG tablet Take 81 mg by mouth daily.      Marland Kitchen atenolol (TENORMIN) 100 MG tablet Take 100 mg by mouth daily.        Marland Kitchen docusate sodium (COLACE) 100 MG capsule Take 200-300 mg by mouth 2 (two) times daily. Takes 2 capsules(200mg ) every morning and 3 capsules(300mg ) every night      . insulin glargine (LANTUS) 100 UNIT/ML injection Inject 64 Units into the skin at bedtime.       . metoCLOPramide (REGLAN) 5 MG tablet Take 1 tablet (5 mg total) by mouth 4 (four) times daily.  120 tablet  2  . NOVOLOG MIX 70/30 (70-30)  100 UNIT/ML injection Inject 2-28 Units into the skin once a week. Per sliding scale.Marland KitchenMarland KitchenOnly takes morning after taking dexamethasone      . oxyCODONE (OXYCONTIN) 40 MG 12 hr tablet Take 1 tablet (40 mg total) by mouth every 12 (twelve) hours.  60 tablet  0  . simvastatin (ZOCOR) 10 MG tablet Take 10 mg by mouth at bedtime.        . Tamsulosin HCl (FLOMAX) 0.4 MG CAPS Take 1 capsule (0.4 mg total) by mouth daily after supper.  30 capsule  2  . valsartan-hydrochlorothiazide (DIOVAN-HCT) 160-12.5 MG per tablet Take 1 tablet by mouth daily.        Marland Kitchen dexamethasone (DECADRON) 4 MG tablet Take 40 mg by  mouth once a week. On tuesdays      . LEVITRA 20 MG tablet Take 20 mg by mouth Daily.        SURGICAL HISTORY:  Past Surgical History  Procedure Date  . Circumcision   . Hand surgery     REVIEW OF SYSTEMS:  A comprehensive review of systems was negative except for: Constitutional: positive for fatigue   PHYSICAL EXAMINATION: General appearance: alert, cooperative and no distress Head: Normocephalic, without obvious abnormality, atraumatic Neck: no adenopathy Lymph nodes: Cervical, supraclavicular, and axillary nodes normal. Resp: clear to auscultation bilaterally Cardio: regular rate and rhythm, S1, S2 normal, no murmur, click, rub or gallop GI: soft, non-tender; bowel sounds normal; no masses,  no organomegaly Extremities: extremities normal, atraumatic, no cyanosis or edema Neurologic: Alert and oriented X 3, normal strength and tone. Normal symmetric reflexes. Normal coordination and gait  ECOG PERFORMANCE STATUS: 1 - Symptomatic but completely ambulatory  Blood pressure 150/97, pulse 77, temperature 97.3 F (36.3 C), temperature source Oral, resp. rate 20, height 5\' 7"  (1.702 m), weight 195 lb 4.8 oz (88.587 kg).  LABORATORY DATA: Lab Results  Component Value Date   WBC 3.4* 11/10/2011   HGB 10.7* 11/10/2011   HCT 31.9* 11/10/2011   MCV 95.1 11/10/2011   PLT 460* 11/10/2011      Chemistry      Component Value Date/Time   NA 142 11/10/2011 1140   NA 139 10/27/2011 0335   K 4.3 11/10/2011 1140   K 3.7 10/27/2011 0335   CL 109* 11/10/2011 1140   CL 105 10/27/2011 0335   CO2 23 11/10/2011 1140   CO2 23 10/27/2011 0335   BUN 23.0 11/10/2011 1140   BUN 22 10/27/2011 0335   CREATININE 1.4* 11/10/2011 1140   CREATININE 1.12 10/27/2011 0335   GLU 293* 08/12/2009 1322      Component Value Date/Time   CALCIUM 9.5 11/10/2011 1140   CALCIUM 8.4 10/27/2011 0335   ALKPHOS 134 11/10/2011 1140   ALKPHOS 84 10/21/2011 0528   AST 17 11/10/2011 1140   AST 12 10/21/2011 0528   ALT 13 11/10/2011 1140    ALT 11 10/21/2011 0528   BILITOT 0.40 11/10/2011 1140   BILITOT 0.7 10/21/2011 0528       RADIOGRAPHIC STUDIES: Dg Chest 2 View  10/21/2011  *RADIOLOGY REPORT*  Clinical Data: Fever.  Multiple myeloma.  CHEST - 2 VIEW  Comparison: Plain film of the chest 12/31/2010.  Findings: Streaky opacity is present in the right lung base.  Left lung is clear.  No pneumothorax or effusion.  Heart size is normal. Multiple thoracic and lumbar compression fracture deformities are seen.  Punctate lytic lesions are present in the clavicles, proximal humeri and scapulae consistent history multiple myeloma. Remote  right rib fractures are also noted.  IMPRESSION:  1.  Streaky right basilar airspace opacity could be due to atelectasis or pneumonia. 2.  Findings consistent with multiple myeloma.   Original Report Authenticated By: Bernadene Bell. D'ALESSIO, M.D.    Dg Abd 1 View  10/25/2011  *RADIOLOGY REPORT*  Clinical Data: Abdominal distention and constipation.  ABDOMEN - 1 VIEW  Comparison: Kyphoplasty and vertebroplasty images dated 04/05/2008.  Findings: Normal bowel gas pattern.  Minimal stool in the colon. Vertebroplasty material in multiple lumbar vertebral bodies. Calcific density is again overlying both upper sacroiliac regions. Previously, on the right, this had an appearance suggesting an exostosis and on the left had an appearance suggesting degenerative hypertrophy.  IMPRESSION: No acute abnormality.   Original Report Authenticated By: Darrol Angel, M.D.    Ct Head Wo Contrast  10/21/2011  *RADIOLOGY REPORT*  Clinical Data: Multiple myeloma, mental status change  CT HEAD WITHOUT CONTRAST  Technique:  Contiguous axial images were obtained from the base of the skull through the vertex without contrast.  Comparison: 01/13/2008  Findings: Atherosclerotic and physiologic intracranial calcifications.  Innumerable calvarial lytic lesions, largely stable compared the prior study.  Mild atrophy. There is no evidence of acute  intracranial hemorrhage, brain edema, mass lesion, acute infarction,   mass effect, or midline shift. Acute infarct may be inapparent on noncontrast CT.  No other intra-axial abnormalities are seen, and the ventricles and sulci are within normal limits in size and symmetry.   No abnormal extra-axial fluid collections or masses are identified.  IMPRESSION: 1. Negative for bleed or other acute intracranial process.   Original Report Authenticated By: Osa Craver, M.D.    Ct Chest Wo Contrast  10/24/2011  *RADIOLOGY REPORT*  Clinical Data: Fever and pneumonia.  Rule out empyema.  CT CHEST WITHOUT CONTRAST  Technique:  Multidetector CT imaging of the chest was performed following the standard protocol without IV contrast.  Comparison: Plain film 10/21/2011.  No prior CT.  Findings: Lung windows demonstrate collapse / consolidative change with air bronchograms involving the right middle lobe on image 29. More patchy right lower lobe airspace disease, including dependently on image 35.  Patchy ground-glass left upper lobe opacities.  A left upper lobe soft tissue density nodule measures 9 mm on image 17 but has a suggestion of surrounding ground-glass opacity.  Soft tissue windows demonstrate bovine arch.  Tortuous descending thoracic aorta.  Mild cardiomegaly with dense multivessel coronary artery atherosclerosis.  No pericardial effusion or left-sided pleural effusion.  There is a small right-sided pleural effusion.  This has minimal loculation superiorly on image 15.  There is no air within the pleural space.  Subcarinal nodes are upper normal at up to 11 mm.  Hilar regions poorly evaluated secondary lack of IV contrast.  Limited abdominal imaging demonstrates probable gallstones. Normal adrenal glands.  Suggestion of pelviectasis involving the upper pole right kidney.  Image 55.  Incompletely imaged. Low density upper pole right renal lesion which is most likely a cyst.  Osteopenia.  Innumerable lytic  lesions throughout the marrow space. There is likely a healed fracture of the right eleventh rib posteriorly. Remote second anterior rib remote fracture as well.  A lipoma within the right latissimus musculature of the 2.4 cm. Multiple thoracic compression deformities. Mild canal encroachment at numerous levels.  A L1 vertebral plasty has been performed.  IMPRESSION:  1.  Multifocal right greater than left airspace and ground-glass opacities, most consistent with infection. 2.  Small  right-sided pleural effusion with minimal loculation posteriorly.  Decrease sensitivity for empyema, secondary lack of IV contrast.  Given this factor, no specific evidence of empyema identified. 3.  A left upper lobe pulmonary nodule which measures 9 mm.  This could also be infectious, given the suggestion of surrounding inflammation.  However, follow-up with chest CT at 3 months is recommended to confirm resolution.  These results will be called to the ordering clinician or representative by the Radiologist Assistant, and communication documented in the PACS Dashboard. 4. Cannot exclude upper pole right-sided renal pelviectasis. Incompletely imaged.  Consider renal ultrasound. 5.  Findings of multiple myeloma with numerous complicating fractures, as above.   Original Report Authenticated By: Consuello Bossier, M.D.     ASSESSMENT: This is a very pleasant 66 years old [place] American male with relapsed multiple myeloma currently on treatment with Pomalyst status post 5 cycles. The patient has been off treatment for the last 4 weeks and he is feeling much better. His lab results today showed mixed response in the protein study.  PLAN: I have a lengthy discussion with the patient and his wife today about his condition. I gave him the option of continuing treatment with Pomalyst at the same dose versus changing treatment to Kyprolis. I discussed the adverse effect of the treatment with the patient.  He preferred to continue on the  Pomalyst for now. We will call his prescription for refill. He would come back for followup visit in 4 weeks for evaluation and repeat blood work. He was advised to call immediately if he has any concerning symptoms in the interval.   All questions were answered. The patient knows to call the clinic with any problems, questions or concerns. We can certainly see the patient much sooner if necessary.  I spent 15 minutes counseling the patient face to face. The total time spent in the appointment was 25 minutes.

## 2011-11-17 NOTE — Telephone Encounter (Signed)
Pt has not called in his survey so the pomalyst has not been sent. I called pt to do survey

## 2011-11-17 NOTE — Patient Instructions (Signed)
Continue treatment with Pomalyst. Followup in 4 weeks

## 2011-11-17 NOTE — Telephone Encounter (Signed)
gv pt appt schedule for October.  °

## 2011-11-18 ENCOUNTER — Other Ambulatory Visit: Payer: Self-pay | Admitting: Medical Oncology

## 2011-11-18 DIAGNOSIS — C9000 Multiple myeloma not having achieved remission: Secondary | ICD-10-CM

## 2011-11-19 MED ORDER — POMALIDOMIDE 4 MG PO CAPS: 4.0000 mg | ORAL_CAPSULE | Freq: Every day | ORAL | Status: DC

## 2011-11-19 NOTE — Telephone Encounter (Signed)
FAXED RX FOR POMALYST TO [facility]-

## 2011-11-20 NOTE — Telephone Encounter (Signed)
RECEIVED A FAX FROM [facility] CONCERNING A CONFIRMATION OF PRESCRIPTION SHIPMENT FOR POMALYST ON 11/19/11.

## 2011-12-10 ENCOUNTER — Other Ambulatory Visit: Payer: Self-pay | Admitting: Medical Oncology

## 2011-12-10 ENCOUNTER — Other Ambulatory Visit: Payer: Self-pay | Admitting: *Deleted

## 2011-12-10 DIAGNOSIS — C9000 Multiple myeloma not having achieved remission: Secondary | ICD-10-CM

## 2011-12-10 DIAGNOSIS — R52 Pain, unspecified: Secondary | ICD-10-CM

## 2011-12-10 MED ORDER — OXYCODONE HCL 40 MG PO TB12: 40.0000 mg | ORAL_TABLET | Freq: Two times a day (BID) | ORAL | Status: DC

## 2011-12-10 NOTE — Telephone Encounter (Signed)
THIS REFILL REQUEST FOR POMALYST WAS GIVEN TO DR.MOHAMED.

## 2011-12-14 ENCOUNTER — Encounter: Payer: Self-pay | Admitting: Physician Assistant

## 2011-12-14 ENCOUNTER — Other Ambulatory Visit (HOSPITAL_BASED_OUTPATIENT_CLINIC_OR_DEPARTMENT_OTHER): Payer: 59 | Admitting: Lab

## 2011-12-14 ENCOUNTER — Ambulatory Visit (HOSPITAL_BASED_OUTPATIENT_CLINIC_OR_DEPARTMENT_OTHER): Payer: 59 | Admitting: Physician Assistant

## 2011-12-14 ENCOUNTER — Telehealth: Payer: Self-pay | Admitting: Internal Medicine

## 2011-12-14 VITALS — BP 131/80 | HR 65 | Temp 97.1°F | Resp 20 | Ht 67.0 in | Wt 193.8 lb

## 2011-12-14 DIAGNOSIS — C9002 Multiple myeloma in relapse: Secondary | ICD-10-CM

## 2011-12-14 LAB — CBC WITH DIFFERENTIAL/PLATELET
Basophils Absolute: 0 10*3/uL (ref 0.0–0.1)
EOS%: 2.4 % (ref 0.0–7.0)
Eosinophils Absolute: 0 10*3/uL (ref 0.0–0.5)
HCT: 35.4 % — ABNORMAL LOW (ref 38.4–49.9)
HGB: 12.1 g/dL — ABNORMAL LOW (ref 13.0–17.1)
MCH: 31.8 pg (ref 27.2–33.4)
MCV: 93.5 fL (ref 79.3–98.0)
MONO%: 15.9 % — ABNORMAL HIGH (ref 0.0–14.0)
NEUT#: 1.2 10*3/uL — ABNORMAL LOW (ref 1.5–6.5)
NEUT%: 59.1 % (ref 39.0–75.0)
RDW: 15.5 % — ABNORMAL HIGH (ref 11.0–14.6)

## 2011-12-14 LAB — COMPREHENSIVE METABOLIC PANEL (CC13)
Albumin: 3.3 g/dL — ABNORMAL LOW (ref 3.5–5.0)
BUN: 48 mg/dL — ABNORMAL HIGH (ref 7.0–26.0)
Calcium: 9.6 mg/dL (ref 8.4–10.4)
Chloride: 105 mEq/L (ref 98–107)
Creatinine: 1.8 mg/dL — ABNORMAL HIGH (ref 0.7–1.3)
Glucose: 304 mg/dl — ABNORMAL HIGH (ref 70–99)
Potassium: 4.2 mEq/L (ref 3.5–5.1)

## 2011-12-14 MED ORDER — FILGRASTIM 480 MCG/0.8ML IJ SOLN: 480.0000 ug | INTRAMUSCULAR | Status: DC

## 2011-12-14 NOTE — Patient Instructions (Addendum)
Continue Pomalyst directed Followup with Dr. Arbutus Ped in one month with repeat protein studies to reevaluate her disease A prescription for Neupogen 480 mcg total cancer and is with 1 refill was sent to her pharmacy of record to be used as directed in the event of recurrent neutropenia

## 2011-12-14 NOTE — Telephone Encounter (Signed)
appts made and printed for pt aom °

## 2011-12-15 ENCOUNTER — Other Ambulatory Visit: Payer: 59 | Admitting: Lab

## 2011-12-15 ENCOUNTER — Ambulatory Visit: Payer: 59 | Admitting: Physician Assistant

## 2011-12-15 MED ORDER — POMALIDOMIDE 4 MG PO CAPS: 4.0000 mg | ORAL_CAPSULE | Freq: Every day | ORAL | Status: DC

## 2011-12-15 NOTE — Progress Notes (Signed)
[facility] [phone]   Fax:([phone]  OFFICE PROGRESS NOTE  Katy Apo, MD [address] [employer] [address] [place] [address]  DIAGNOSIS:  Multiple myeloma IgA subtype diagnosed in November, 2009.   PRIOR THERAPY:  1. Status post seven cycles of systemic chemotherapy with Revlimid and low-dose Betatron with partial response. The last dose was given June, 2010. 2. Status post chemotherapy with weekly Velcade in a patient on Decadron 40 mg on a weekly basis, status post 75 weekly doses. The last dose was given May 04, 2008. 3. Status post treatment again with Velcade and Decadron. The patient was awaiting stem cell transplant at [facility]. Last dose was given Jul 16, 2009. 4. The patient was not considered a good candidate for autologous stem cell transplant at that time secondary to poor mobilization of his stem cells.  5. Status post six cycles of systemic chemotherapy with Velcade, Cytoxan and Betatron. Last dose was given March 25, 2010. This continued secondary to his disease progression. 6. Status post ten cycles of systemic chemotherapy with Velcade. Doxil and Decadron. Last dose was given on 10/31/2010. Currently on hold because of concern about cardiac toxicity.  CURRENT THERAPY:  1. Pomalyst 4 mg by mouth daily for 21 days every 28 days, completed cycle #6. 2. Decadron 40 mg by mouth weekly   INTERVAL HISTORY: Rodney Green 66 y.o. male returns to the clinic today for followup visit. He reports some problems with constipation but states that they are well managed with stool softeners twice a day. He has some itching but manages this well with Benadryl. He continues to tolerate treatment with Pomalyst relatively well except for periodic neutropenia. This has been managed with Neupogen. He is due for refill of his Pomalyst and also will need a prescription for Neupogen for home use under our direction. He voiced  no other specific complaints. He denied having any significant weight loss or night sweats. He denied having any chest pain, shortness breath, cough or hemoptysis. He still has mild fatigue.   MEDICAL HISTORY: Past Medical History  Diagnosis Date  . Allergy   . Diabetes mellitus   . Hypertension   . Hepatitis C   . Multiple myeloma(203.0)   . Chronic kidney disease     ALLERGIES:  is allergic to ace inhibitors.  MEDICATIONS:  Current Outpatient Prescriptions  Medication Sig Dispense Refill  . acetaminophen (TYLENOL) 325 MG tablet Take 2 tablets (650 mg total) by mouth every 6 (six) hours as needed.      Marland Kitchen amLODipine (NORVASC) 5 MG tablet Take 5 mg by mouth daily.        Marland Kitchen aspirin 81 MG tablet Take 81 mg by mouth daily.      Marland Kitchen atenolol (TENORMIN) 100 MG tablet Take 100 mg by mouth daily.        Marland Kitchen dexamethasone (DECADRON) 4 MG tablet Take 40 mg by mouth once a week. On tuesdays      . docusate sodium (COLACE) 100 MG capsule Take 200-300 mg by mouth 2 (two) times daily. Takes 2 capsules(200mg ) every morning and 3 capsules(300mg ) every night      . filgrastim (NEUPOGEN) 480 MCG/0.8ML SOLN injection Inject 0.8 mLs (480 mcg total) into the skin as directed.  10 Syringe  1  . insulin glargine (LANTUS) 100 UNIT/ML injection Inject 64 Units into the skin at bedtime.       Marland Kitchen LEVITRA 20 MG tablet  Take 20 mg by mouth Daily.      . metoCLOPramide (REGLAN) 5 MG tablet Take 1 tablet (5 mg total) by mouth 4 (four) times daily.  120 tablet  2  . NOVOLOG MIX 70/30 (70-30) 100 UNIT/ML injection Inject 2-28 Units into the skin once a week. Per sliding scale.Marland KitchenMarland KitchenOnly takes morning after taking dexamethasone      . oxyCODONE (OXYCONTIN) 40 MG 12 hr tablet Take 1 tablet (40 mg total) by mouth every 12 (twelve) hours.  60 tablet  0  . pomalidomide (POMALYST) 4 MG capsule Take 1 capsule (4 mg total) by mouth daily.  21 capsule  0  . simvastatin (ZOCOR) 10 MG tablet Take 10 mg by mouth at bedtime.        .  Tamsulosin HCl (FLOMAX) 0.4 MG CAPS Take 1 capsule (0.4 mg total) by mouth daily after supper.  30 capsule  2  . valsartan-hydrochlorothiazide (DIOVAN-HCT) 160-12.5 MG per tablet Take 1 tablet by mouth daily.          SURGICAL HISTORY:  Past Surgical History  Procedure Date  . Circumcision   . Hand surgery     REVIEW OF SYSTEMS:  A comprehensive review of systems was negative except for: Constitutional: positive for fatigue Gastrointestinal: positive for constipation   PHYSICAL EXAMINATION: General appearance: alert, cooperative and no distress Head: Normocephalic, without obvious abnormality, atraumatic Neck: no adenopathy Lymph nodes: Cervical, supraclavicular, and axillary nodes normal. Resp: clear to auscultation bilaterally Cardio: regular rate and rhythm, S1, S2 normal, no murmur, click, rub or gallop GI: soft, non-tender; bowel sounds normal; no masses,  no organomegaly Extremities: extremities normal, atraumatic, no cyanosis or edema Neurologic: Alert and oriented X 3, normal strength and tone. Normal symmetric reflexes. Normal coordination and gait  ECOG PERFORMANCE STATUS: 1 - Symptomatic but completely ambulatory  Blood pressure 131/80, pulse 65, temperature 97.1 F (36.2 C), resp. rate 20, height 5\' 7"  (1.702 m), weight 193 lb 12.8 oz (87.907 kg).  LABORATORY DATA: Lab Results  Component Value Date   WBC 2.0* 12/14/2011   HGB 12.1* 12/14/2011   HCT 35.4* 12/14/2011   MCV 93.5 12/14/2011   PLT 218 12/14/2011      Chemistry      Component Value Date/Time   NA 139 12/14/2011 0941   NA 139 10/27/2011 0335   K 4.2 12/14/2011 0941   K 3.7 10/27/2011 0335   CL 105 12/14/2011 0941   CL 105 10/27/2011 0335   CO2 23 12/14/2011 0941   CO2 23 10/27/2011 0335   BUN 48.0* 12/14/2011 0941   BUN 22 10/27/2011 0335   CREATININE 1.8* 12/14/2011 0941   CREATININE 1.12 10/27/2011 0335   GLU 293* 08/12/2009 1322      Component Value Date/Time   CALCIUM 9.6 12/14/2011 0941    CALCIUM 8.4 10/27/2011 0335   ALKPHOS 141 12/14/2011 0941   ALKPHOS 84 10/21/2011 0528   AST 12 12/14/2011 0941   AST 12 10/21/2011 0528   ALT 19 12/14/2011 0941   ALT 11 10/21/2011 0528   BILITOT 0.40 12/14/2011 0941   BILITOT 0.7 10/21/2011 0528       RADIOGRAPHIC STUDIES: Dg Chest 2 View  10/21/2011  *RADIOLOGY REPORT*  Clinical Data: Fever.  Multiple myeloma.  CHEST - 2 VIEW  Comparison: Plain film of the chest 12/31/2010.  Findings: Streaky opacity is present in the right lung base.  Left lung is clear.  No pneumothorax or effusion.  Heart size is normal. Multiple thoracic  and lumbar compression fracture deformities are seen.  Punctate lytic lesions are present in the clavicles, proximal humeri and scapulae consistent history multiple myeloma. Remote right rib fractures are also noted.  IMPRESSION:  1.  Streaky right basilar airspace opacity could be due to atelectasis or pneumonia. 2.  Findings consistent with multiple myeloma.   Original Report Authenticated By: Bernadene Bell. D'ALESSIO, M.D.    Dg Abd 1 View  10/25/2011  *RADIOLOGY REPORT*  Clinical Data: Abdominal distention and constipation.  ABDOMEN - 1 VIEW  Comparison: Kyphoplasty and vertebroplasty images dated 04/05/2008.  Findings: Normal bowel gas pattern.  Minimal stool in the colon. Vertebroplasty material in multiple lumbar vertebral bodies. Calcific density is again overlying both upper sacroiliac regions. Previously, on the right, this had an appearance suggesting an exostosis and on the left had an appearance suggesting degenerative hypertrophy.  IMPRESSION: No acute abnormality.   Original Report Authenticated By: Darrol Angel, M.D.    Ct Head Wo Contrast  10/21/2011  *RADIOLOGY REPORT*  Clinical Data: Multiple myeloma, mental status change  CT HEAD WITHOUT CONTRAST  Technique:  Contiguous axial images were obtained from the base of the skull through the vertex without contrast.  Comparison: 01/13/2008  Findings: Atherosclerotic  and physiologic intracranial calcifications.  Innumerable calvarial lytic lesions, largely stable compared the prior study.  Mild atrophy. There is no evidence of acute intracranial hemorrhage, brain edema, mass lesion, acute infarction,   mass effect, or midline shift. Acute infarct may be inapparent on noncontrast CT.  No other intra-axial abnormalities are seen, and the ventricles and sulci are within normal limits in size and symmetry.   No abnormal extra-axial fluid collections or masses are identified.  IMPRESSION: 1. Negative for bleed or other acute intracranial process.   Original Report Authenticated By: Osa Craver, M.D.    Ct Chest Wo Contrast  10/24/2011  *RADIOLOGY REPORT*  Clinical Data: Fever and pneumonia.  Rule out empyema.  CT CHEST WITHOUT CONTRAST  Technique:  Multidetector CT imaging of the chest was performed following the standard protocol without IV contrast.  Comparison: Plain film 10/21/2011.  No prior CT.  Findings: Lung windows demonstrate collapse / consolidative change with air bronchograms involving the right middle lobe on image 29. More patchy right lower lobe airspace disease, including dependently on image 35.  Patchy ground-glass left upper lobe opacities.  A left upper lobe soft tissue density nodule measures 9 mm on image 17 but has a suggestion of surrounding ground-glass opacity.  Soft tissue windows demonstrate bovine arch.  Tortuous descending thoracic aorta.  Mild cardiomegaly with dense multivessel coronary artery atherosclerosis.  No pericardial effusion or left-sided pleural effusion.  There is a small right-sided pleural effusion.  This has minimal loculation superiorly on image 15.  There is no air within the pleural space.  Subcarinal nodes are upper normal at up to 11 mm.  Hilar regions poorly evaluated secondary lack of IV contrast.  Limited abdominal imaging demonstrates probable gallstones. Normal adrenal glands.  Suggestion of pelviectasis involving  the upper pole right kidney.  Image 55.  Incompletely imaged. Low density upper pole right renal lesion which is most likely a cyst.  Osteopenia.  Innumerable lytic lesions throughout the marrow space. There is likely a healed fracture of the right eleventh rib posteriorly. Remote second anterior rib remote fracture as well.  A lipoma within the right latissimus musculature of the 2.4 cm. Multiple thoracic compression deformities. Mild canal encroachment at numerous levels.  A L1 vertebral  plasty has been performed.  IMPRESSION:  1.  Multifocal right greater than left airspace and ground-glass opacities, most consistent with infection. 2.  Small right-sided pleural effusion with minimal loculation posteriorly.  Decrease sensitivity for empyema, secondary lack of IV contrast.  Given this factor, no specific evidence of empyema identified. 3.  A left upper lobe pulmonary nodule which measures 9 mm.  This could also be infectious, given the suggestion of surrounding inflammation.  However, follow-up with chest CT at 3 months is recommended to confirm resolution.  These results will be called to the ordering clinician or representative by the Radiologist Assistant, and communication documented in the PACS Dashboard. 4. Cannot exclude upper pole right-sided renal pelviectasis. Incompletely imaged.  Consider renal ultrasound. 5.  Findings of multiple myeloma with numerous complicating fractures, as above.   Original Report Authenticated By: Consuello Bossier, M.D.     ASSESSMENT/PLAN: This is a very pleasant 66 years old [place] American male with relapsed multiple myeloma currently on treatment with Pomalyst status post 6 cycles. The patient was discussed with Dr. Arbutus Ped. He will continue on Pomalyst at the current dose.  This medication will be refilled via his specialty pharmacy. A prescription for Neupogen 480 mcg was also sent to his specialty pharmacy to be used only when directed. He will follow up with Dr.  Arbutus Ped in one month with repeat protein studies to re-evaluate his disease.  Rodney Green, Rodney Morsch E, PA-C   All questions were answered. The patient knows to call the clinic with any problems, questions or concerns. We can certainly see the patient much sooner if necessary.  I spent 15 minutes counseling the patient face to face. The total time spent in the appointment was 25 minutes.

## 2011-12-15 NOTE — Addendum Note (Signed)
Addended by: Arvilla Meres on: 12/15/2011 09:39 AM   Modules accepted: Orders

## 2011-12-16 NOTE — Telephone Encounter (Signed)
[facility] faxed confirmation of prescription shipment.  Pomalyst was shipped 12-15-2011 with next business day delivery.

## 2011-12-22 ENCOUNTER — Telehealth: Payer: Self-pay | Admitting: *Deleted

## 2011-12-22 DIAGNOSIS — C9002 Multiple myeloma in relapse: Secondary | ICD-10-CM

## 2011-12-22 MED ORDER — FILGRASTIM 480 MCG/0.8ML IJ SOLN: 480.0000 ug | INTRAMUSCULAR | Status: DC

## 2011-12-22 NOTE — Telephone Encounter (Signed)
[facility]  [phone]

## 2011-12-23 ENCOUNTER — Telehealth: Payer: Self-pay | Admitting: Medical Oncology

## 2011-12-23 NOTE — Telephone Encounter (Signed)
Faxed rx clarification for neupogen

## 2011-12-24 ENCOUNTER — Telehealth: Payer: Self-pay | Admitting: Medical Oncology

## 2011-12-24 NOTE — Telephone Encounter (Signed)
[facility] needs clarification on Neupogen prescribing directions. Approved to start 12/14/11-06/21/12. Case number [id number]

## 2012-01-04 ENCOUNTER — Telehealth: Payer: Self-pay | Admitting: *Deleted

## 2012-01-04 ENCOUNTER — Other Ambulatory Visit (HOSPITAL_BASED_OUTPATIENT_CLINIC_OR_DEPARTMENT_OTHER): Payer: 59

## 2012-01-04 DIAGNOSIS — C9002 Multiple myeloma in relapse: Secondary | ICD-10-CM

## 2012-01-04 LAB — CBC WITH DIFFERENTIAL/PLATELET
BASO%: 2.7 % — ABNORMAL HIGH (ref 0.0–2.0)
HCT: 39.6 % (ref 38.4–49.9)
LYMPH%: 19.9 % (ref 14.0–49.0)
MCHC: 33.1 g/dL (ref 32.0–36.0)
MCV: 90 fL (ref 79.3–98.0)
MONO#: 0.3 10*3/uL (ref 0.1–0.9)
MONO%: 21.9 % — ABNORMAL HIGH (ref 0.0–14.0)
NEUT%: 36.3 % — ABNORMAL LOW (ref 39.0–75.0)
Platelets: 172 10*3/uL (ref 140–400)
WBC: 1.5 10*3/uL — ABNORMAL LOW (ref 4.0–10.3)

## 2012-01-04 LAB — COMPREHENSIVE METABOLIC PANEL (CC13)
ALT: 19 U/L (ref 0–55)
Alkaline Phosphatase: 160 U/L — ABNORMAL HIGH (ref 40–150)
CO2: 28 mEq/L (ref 22–29)
Creatinine: 1.8 mg/dL — ABNORMAL HIGH (ref 0.7–1.3)
Glucose: 338 mg/dl — ABNORMAL HIGH (ref 70–99)
Total Bilirubin: 0.52 mg/dL (ref 0.20–1.20)

## 2012-01-04 LAB — LACTATE DEHYDROGENASE (CC13): LDH: 163 U/L (ref 125–245)

## 2012-01-04 NOTE — Telephone Encounter (Signed)
Per Dr Donnald Garre, ANC 0.5, pt needs to take Neupogen x 3 days.  Pt has Neupogen at home.  Called and left msg and requested a call back to confirm they received the message.  SLJ

## 2012-01-06 LAB — BETA 2 MICROGLOBULIN, SERUM: Beta-2 Microglobulin: 4.58 mg/L — ABNORMAL HIGH (ref 1.01–1.73)

## 2012-01-06 LAB — KAPPA/LAMBDA LIGHT CHAINS
Kappa:Lambda Ratio: 0.09 — ABNORMAL LOW (ref 0.26–1.65)
Lambda Free Lght Chn: 28.9 mg/dL — ABNORMAL HIGH (ref 0.57–2.63)

## 2012-01-06 LAB — SPEP & IFE WITH QIG
Alpha-1-Globulin: 7.2 % — ABNORMAL HIGH (ref 2.9–4.9)
Alpha-2-Globulin: 14.1 % — ABNORMAL HIGH (ref 7.1–11.8)
Beta 2: 5.2 % (ref 3.2–6.5)
Gamma Globulin: 14.7 % (ref 11.1–18.8)

## 2012-01-08 ENCOUNTER — Other Ambulatory Visit: Payer: Self-pay | Admitting: Medical Oncology

## 2012-01-08 NOTE — Telephone Encounter (Signed)
Pomalyst refill sent to Dr Arbutus Ped and left message for pt  to do survey

## 2012-01-11 ENCOUNTER — Telehealth: Payer: Self-pay | Admitting: Internal Medicine

## 2012-01-11 ENCOUNTER — Ambulatory Visit (HOSPITAL_BASED_OUTPATIENT_CLINIC_OR_DEPARTMENT_OTHER): Payer: 59 | Admitting: Internal Medicine

## 2012-01-11 ENCOUNTER — Ambulatory Visit (HOSPITAL_BASED_OUTPATIENT_CLINIC_OR_DEPARTMENT_OTHER): Payer: 59 | Admitting: Lab

## 2012-01-11 ENCOUNTER — Other Ambulatory Visit: Payer: Self-pay | Admitting: Medical Oncology

## 2012-01-11 ENCOUNTER — Telehealth: Payer: Self-pay | Admitting: Medical Oncology

## 2012-01-11 VITALS — BP 126/78 | HR 58 | Temp 97.0°F | Resp 18 | Ht 67.0 in | Wt 194.4 lb

## 2012-01-11 DIAGNOSIS — C9000 Multiple myeloma not having achieved remission: Secondary | ICD-10-CM

## 2012-01-11 DIAGNOSIS — C9002 Multiple myeloma in relapse: Secondary | ICD-10-CM

## 2012-01-11 DIAGNOSIS — R52 Pain, unspecified: Secondary | ICD-10-CM

## 2012-01-11 LAB — COMPREHENSIVE METABOLIC PANEL (CC13)
ALT: 18 U/L (ref 0–55)
AST: 10 U/L (ref 5–34)
Albumin: 3.4 g/dL — ABNORMAL LOW (ref 3.5–5.0)
Calcium: 9.9 mg/dL (ref 8.4–10.4)
Chloride: 105 mEq/L (ref 98–107)
Potassium: 4.5 mEq/L (ref 3.5–5.1)

## 2012-01-11 LAB — CBC WITH DIFFERENTIAL/PLATELET
BASO%: 0.6 % (ref 0.0–2.0)
HCT: 38.1 % — ABNORMAL LOW (ref 38.4–49.9)
MCHC: 33.1 g/dL (ref 32.0–36.0)
MONO#: 0.8 10*3/uL (ref 0.1–0.9)
NEUT#: 2 10*3/uL (ref 1.5–6.5)
NEUT%: 54.2 % (ref 39.0–75.0)
RBC: 4.2 10*6/uL (ref 4.20–5.82)
WBC: 3.6 10*3/uL — ABNORMAL LOW (ref 4.0–10.3)
lymph#: 0.6 10*3/uL — ABNORMAL LOW (ref 0.9–3.3)
nRBC: 0 % (ref 0–0)

## 2012-01-11 MED ORDER — POMALIDOMIDE 4 MG PO CAPS: 4.0000 mg | ORAL_CAPSULE | Freq: Every day | ORAL | Status: DC

## 2012-01-11 MED ORDER — DEXAMETHASONE 4 MG PO TABS: 40.0000 mg | ORAL_TABLET | ORAL | Status: DC

## 2012-01-11 MED ORDER — OXYCODONE HCL 40 MG PO TB12: 40.0000 mg | ORAL_TABLET | Freq: Two times a day (BID) | ORAL | Status: DC

## 2012-01-11 NOTE — Telephone Encounter (Signed)
wife calls to request refills. Sent to Dr Arbutus Ped for apporval

## 2012-01-11 NOTE — Patient Instructions (Signed)
No evidence for disease progression on the myeloma panel. Continue treatment with Pomalyst and Decadron. Followup in 4 weeks. We'll resume treatment with Zometa

## 2012-01-11 NOTE — Telephone Encounter (Signed)
gv pt appt schedule for December 2013 thru March 2014.  °

## 2012-01-11 NOTE — Progress Notes (Signed)
[facility] [phone]   Fax:([phone]  OFFICE PROGRESS NOTE  Katy Apo, MD [address] [employer] [address] [place] [address]  DIAGNOSIS:  Multiple myeloma IgA subtype diagnosed in November, 2009.   PRIOR THERAPY:  1. Status post seven cycles of systemic chemotherapy with Revlimid and low-dose Betatron with partial response. The last dose was given June, 2010. 2. Status post chemotherapy with weekly Velcade in a patient on Decadron 40 mg on a weekly basis, status post 75 weekly doses. The last dose was given May 04, 2008. 3. Status post treatment again with Velcade and Decadron. The patient was awaiting stem cell transplant at [facility]. Last dose was given Jul 16, 2009. 4. The patient was not considered a good candidate for autologous stem cell transplant at that time secondary to poor mobilization of his stem cells.  5. Status post six cycles of systemic chemotherapy with Velcade, Cytoxan and Betatron. Last dose was given March 25, 2010. This continued secondary to his disease progression. 6. Status post ten cycles of systemic chemotherapy with Velcade. Doxil and Decadron. Last dose was given on 10/31/2010. Currently on hold because of concern about cardiac toxicity.  CURRENT THERAPY:  1. Pomalyst 4 mg by mouth daily for 21 days every 28 days, completed cycle #7. 2. Decadron 40 mg by mouth weekly  INTERVAL HISTORY: Rodney Green 66 y.o. male returns to the clinic today for routine followup visit accompanied by his wife. The patient is tolerating his current treatment with Pomalyst and Decadron fairly well with no significant adverse effects except for occasional aching pain after the Neupogen injection which he had recently secondary to significant neutropenia. He denied having any significant weight loss or night sweats. He denied having any chest pain, shortness of breath, cough or hemoptysis. No bleeding issues. The  patient has repeat CBC, comprehensive metabolic panel, LDH and myeloma panel performed recently and he is here today for evaluation and discussion of his lab results.  MEDICAL HISTORY: Past Medical History  Diagnosis Date  . Allergy   . Diabetes mellitus   . Hypertension   . Hepatitis C   . Multiple myeloma(203.0)   . Chronic kidney disease     ALLERGIES:  is allergic to ace inhibitors.  MEDICATIONS:  Current Outpatient Prescriptions  Medication Sig Dispense Refill  . acetaminophen (TYLENOL) 325 MG tablet Take 2 tablets (650 mg total) by mouth every 6 (six) hours as needed.      Marland Kitchen amLODipine (NORVASC) 5 MG tablet Take 5 mg by mouth daily.        Marland Kitchen aspirin 81 MG tablet Take 81 mg by mouth daily.      Marland Kitchen atenolol (TENORMIN) 100 MG tablet Take 100 mg by mouth daily.        Marland Kitchen dexamethasone (DECADRON) 4 MG tablet Take 10 tablets (40 mg total) by mouth once a week. On tuesdays  30 tablet  1  . docusate sodium (COLACE) 100 MG capsule Take 200-300 mg by mouth 2 (two) times daily. Takes 2 capsules(200mg ) every morning and 3 capsules(300mg ) every night      . doxycycline (VIBRAMYCIN) 100 MG capsule Take 100 mg by mouth Twice daily.      . filgrastim (NEUPOGEN) 480 MCG/0.8ML SOLN injection Inject 0.8 mLs (480 mcg total) into the skin as directed.  10 Syringe  1  . insulin glargine (LANTUS) 100 UNIT/ML injection Inject 64 Units into the skin at bedtime.       Marland Kitchen  LEVITRA 20 MG tablet Take 20 mg by mouth Daily.      Marland Kitchen NOVOLOG MIX 70/30 (70-30) 100 UNIT/ML injection Inject 2-28 Units into the skin once a week. Per sliding scale.Marland KitchenMarland KitchenOnly takes morning after taking dexamethasone      . oxyCODONE (OXYCONTIN) 40 MG 12 hr tablet Take 1 tablet (40 mg total) by mouth every 12 (twelve) hours.  60 tablet  0  . pomalidomide (POMALYST) 4 MG capsule Take 1 capsule (4 mg total) by mouth daily.  21 capsule  0  . senna (SENOKOT) 8.6 MG tablet Take 1 tablet by mouth daily.      . simvastatin (ZOCOR) 10 MG tablet Take  10 mg by mouth at bedtime.        . Tamsulosin HCl (FLOMAX) 0.4 MG CAPS Take 1 capsule (0.4 mg total) by mouth daily after supper.  30 capsule  2  . valsartan-hydrochlorothiazide (DIOVAN-HCT) 160-12.5 MG per tablet Take 1 tablet by mouth daily.          SURGICAL HISTORY:  Past Surgical History  Procedure Date  . Circumcision   . Hand surgery     REVIEW OF SYSTEMS:  A comprehensive review of systems was negative except for: Constitutional: positive for fatigue Musculoskeletal: positive for arthralgias   PHYSICAL EXAMINATION: General appearance: alert, cooperative and no distress Head: Normocephalic, without obvious abnormality, atraumatic Neck: no adenopathy Resp: clear to auscultation bilaterally Cardio: regular rate and rhythm, S1, S2 normal, no murmur, click, rub or gallop GI: soft, non-tender; bowel sounds normal; no masses,  no organomegaly Extremities: extremities normal, atraumatic, no cyanosis or edema Neurologic: Alert and oriented X 3, normal strength and tone. Normal symmetric reflexes. Normal coordination and gait  ECOG PERFORMANCE STATUS: 1 - Symptomatic but completely ambulatory  Blood pressure 126/78, pulse 58, temperature 97 F (36.1 C), temperature source Oral, resp. rate 18, height 5\' 7"  (1.702 m), weight 194 lb 6.4 oz (88.179 kg).  LABORATORY DATA: Lab Results  Component Value Date   WBC 3.6* 01/11/2012   HGB 12.6* 01/11/2012   HCT 38.1* 01/11/2012   MCV 90.7 01/11/2012   PLT 194 01/11/2012      Chemistry      Component Value Date/Time   NA 138 01/11/2012 1259   NA 139 10/27/2011 0335   K 4.5 01/11/2012 1259   K 3.7 10/27/2011 0335   CL 105 01/11/2012 1259   CL 105 10/27/2011 0335   CO2 25 01/11/2012 1259   CO2 23 10/27/2011 0335   BUN 39.0* 01/11/2012 1259   BUN 22 10/27/2011 0335   CREATININE 1.8* 01/11/2012 1259   CREATININE 1.12 10/27/2011 0335   GLU 293* 08/12/2009 1322      Component Value Date/Time   CALCIUM 9.9 01/11/2012 1259   CALCIUM 8.4  10/27/2011 0335   ALKPHOS 136 01/11/2012 1259   ALKPHOS 84 10/21/2011 0528   AST 10 01/11/2012 1259   AST 12 10/21/2011 0528   ALT 18 01/11/2012 1259   ALT 11 10/21/2011 0528   BILITOT 0.52 01/11/2012 1259   BILITOT 0.7 10/21/2011 0528     Myeloma panel: Beta-2 microglobulin 4.58, free kappa light chain 2. 53, free lambda light chain 28.90, kappa/lambda ratio 0.09, IgG 768, IgA 437, and IgM 30  RADIOGRAPHIC STUDIES: No results found.  ASSESSMENT: This is a very pleasant 66 years old [place] American male with history of multiple myeloma currently on treatment with Pomalyst and Decadron status post 7 cycles. He is tolerating his treatment fairly well and  he has no evidence for disease progression on his recent blood work.  PLAN: I discussed the lab result with the patient and his wife. I recommended for him to continue treatment with Pomalyst and Decadron as scheduled. The patient will continue to use Neupogen on as-needed basis for neutropenia. He would come back for followup visit in 4 weeks with the start of the next cycle of his chemotherapy. I also discussed with the patient in resuming his treatment with Zometa 4 mg IV every 2 months. He was advised to call me immediately if he has any concerning symptoms in the interval.  All questions were answered. The patient knows to call the clinic with any problems, questions or concerns. We can certainly see the patient much sooner if necessary.  I spent 15 minutes counseling the patient face to face. The total time spent in the appointment was 25 minutes.

## 2012-01-11 NOTE — Telephone Encounter (Signed)
Called in decadron to pharmacy and pt notified.

## 2012-01-11 NOTE — Telephone Encounter (Signed)
Pomalyst rx faxed.

## 2012-01-13 NOTE — Telephone Encounter (Signed)
Rec'd fax from [facility] that patient Pomalyst/Decadron were shipped on 01/12/12 for next day delivery.

## 2012-01-17 ENCOUNTER — Emergency Department (HOSPITAL_COMMUNITY)
Admission: EM | Admit: 2012-01-17 | Discharge: 2012-01-17 | Disposition: A | Discharge status: Home / Self Care | Payer: 59 | Attending: Emergency Medicine | Admitting: Emergency Medicine

## 2012-01-17 ENCOUNTER — Encounter (HOSPITAL_COMMUNITY): Payer: Self-pay | Admitting: Emergency Medicine

## 2012-01-17 DIAGNOSIS — Y9389 Activity, other specified: Secondary | ICD-10-CM | POA: Insufficient documentation

## 2012-01-17 DIAGNOSIS — E119 Type 2 diabetes mellitus without complications: Secondary | ICD-10-CM | POA: Insufficient documentation

## 2012-01-17 DIAGNOSIS — N189 Chronic kidney disease, unspecified: Secondary | ICD-10-CM | POA: Insufficient documentation

## 2012-01-17 DIAGNOSIS — S51809A Unspecified open wound of unspecified forearm, initial encounter: Secondary | ICD-10-CM | POA: Insufficient documentation

## 2012-01-17 DIAGNOSIS — Y929 Unspecified place or not applicable: Secondary | ICD-10-CM | POA: Insufficient documentation

## 2012-01-17 DIAGNOSIS — Z7982 Long term (current) use of aspirin: Secondary | ICD-10-CM | POA: Insufficient documentation

## 2012-01-17 DIAGNOSIS — Z79899 Other long term (current) drug therapy: Secondary | ICD-10-CM | POA: Insufficient documentation

## 2012-01-17 DIAGNOSIS — S51819A Laceration without foreign body of unspecified forearm, initial encounter: Secondary | ICD-10-CM

## 2012-01-17 DIAGNOSIS — Z794 Long term (current) use of insulin: Secondary | ICD-10-CM | POA: Insufficient documentation

## 2012-01-17 DIAGNOSIS — B1920 Unspecified viral hepatitis C without hepatic coma: Secondary | ICD-10-CM | POA: Insufficient documentation

## 2012-01-17 DIAGNOSIS — W260XXA Contact with knife, initial encounter: Secondary | ICD-10-CM | POA: Insufficient documentation

## 2012-01-17 DIAGNOSIS — C9000 Multiple myeloma not having achieved remission: Secondary | ICD-10-CM | POA: Insufficient documentation

## 2012-01-17 DIAGNOSIS — Z87891 Personal history of nicotine dependence: Secondary | ICD-10-CM | POA: Insufficient documentation

## 2012-01-17 DIAGNOSIS — I129 Hypertensive chronic kidney disease with stage 1 through stage 4 chronic kidney disease, or unspecified chronic kidney disease: Secondary | ICD-10-CM | POA: Insufficient documentation

## 2012-01-17 MED ORDER — OXYCODONE-ACETAMINOPHEN 5-325 MG PO TABS: 1.0000 | ORAL_TABLET | Freq: Four times a day (QID) | ORAL | Status: DC | PRN

## 2012-01-17 MED ORDER — CEPHALEXIN 500 MG PO CAPS: 500.0000 mg | ORAL_CAPSULE | Freq: Four times a day (QID) | ORAL | Status: DC

## 2012-01-17 NOTE — ED Notes (Signed)
Dr. Judd Lien at bedside- wound care done, suturing laceration

## 2012-01-17 NOTE — ED Notes (Signed)
Bleeding on arrival- had wrapped with diaper- saturated with blood- when diaper removed -- bleeding moderately, 4x4's and ABD pad applied-

## 2012-01-17 NOTE — ED Provider Notes (Signed)
History  This chart was scribed for Geoffery Lyons, MD by Shari Heritage, ED Scribe. The patient was seen in room TR06C/TR06C. Patient's care was started at 1827.   CSN: [id number]  Arrival date & time 01/17/12  1813   First MD Initiated Contact with Patient 01/17/12 1827       Chief Complaint  Patient presents with  . Extremity Laceration    The history is provided by the patient. No language interpreter was used.    HPI Comments: Rodney Green is a 66 y.o. male who presents to the Emergency Department complaining of severe laceration to the right wrist onset less than 1 hours ago. Patient states that he cut himself with an electric knife that he uses to cut the exhaust pipe of of his car. There is active bleeding. Patient denies any numbness, weakness or tingling in right upper extremity. Patient has a medical history of diabetes, HTN, hepatitis C, multiple myeloma and chronic kidney disease. He is a former smoker.   Past Medical History  Diagnosis Date  . Allergy   . Diabetes mellitus   . Hypertension   . Hepatitis C   . Multiple myeloma(203.0)   . Chronic kidney disease     Past Surgical History  Procedure Date  . Circumcision   . Hand surgery     Family History  Problem Relation Age of Onset  . Cancer Sister   . Diabetes Other   . Hypertension Other     History  Substance Use Topics  . Smoking status: Former Smoker -- 0.5 packs/day for 44 years    Types: Cigarettes    Quit date: 01/24/2008  . Smokeless tobacco: Never Used  . Alcohol Use: Yes     Comment: occ      Review of Systems  Skin: Positive for wound.  All other systems reviewed and are negative.    Allergies  Ace inhibitors  Home Medications   Current Outpatient Rx  Name  Route  Sig  Dispense  Refill  . ACETAMINOPHEN 325 MG PO TABS   Oral   Take 2 tablets (650 mg total) by mouth every 6 (six) hours as needed.         Marland Kitchen AMLODIPINE BESYLATE 5 MG PO TABS   Oral   Take 5 mg by mouth  daily.           . ASPIRIN 81 MG PO TABS   Oral   Take 81 mg by mouth daily.         . ATENOLOL 100 MG PO TABS   Oral   Take 100 mg by mouth daily.           Marland Kitchen DEXAMETHASONE 4 MG PO TABS   Oral   Take 10 tablets (40 mg total) by mouth once a week. On tuesdays   30 tablet   1   . DOCUSATE SODIUM 100 MG PO CAPS   Oral   Take 200-300 mg by mouth 2 (two) times daily. Takes 2 capsules(200mg ) every morning and 3 capsules(300mg ) every night         . DOXYCYCLINE HYCLATE 100 MG PO CAPS   Oral   Take 100 mg by mouth Twice daily.         Marland Kitchen FILGRASTIM 480 MCG/0.8ML IJ SOLN   Subcutaneous   Inject 0.8 mLs (480 mcg total) into the skin as directed.   10 Syringe   1   . INSULIN GLARGINE 100 UNIT/ML [place] SOLN  Subcutaneous   Inject 64 Units into the skin at bedtime.          Marland Kitchen LEVITRA 20 MG PO TABS   Oral   Take 20 mg by mouth Daily.         Marland Kitchen NOVOLOG MIX 70/30 (70-30) 100 UNIT/ML [place] SUSP   Subcutaneous   Inject 2-28 Units into the skin once a week. Per sliding scale.Marland KitchenMarland KitchenOnly takes morning after taking dexamethasone         . OXYCODONE HCL ER 40 MG PO TB12   Oral   Take 1 tablet (40 mg total) by mouth every 12 (twelve) hours.   60 tablet   0   . POMALIDOMIDE 4 MG PO CAPS   Oral   Take 1 capsule (4 mg total) by mouth daily.   21 capsule   0     ADULT MALE/ LEFT MESSAGE ON PT'S HOME VOICE MAIL C ...   . SENNOSIDES 8.6 MG PO TABS   Oral   Take 1 tablet by mouth daily.         Marland Kitchen SIMVASTATIN 10 MG PO TABS   Oral   Take 10 mg by mouth at bedtime.           . TAMSULOSIN HCL 0.4 MG PO CAPS   Oral   Take 1 capsule (0.4 mg total) by mouth daily after supper.   30 capsule   2   . VALSARTAN-HYDROCHLOROTHIAZIDE 160-12.5 MG PO TABS   Oral   Take 1 tablet by mouth daily.             Triage Vitals: BP 127/77  Pulse 69  Temp 97.7 F (36.5 C) (Oral)  Resp 18  SpO2 98%  Physical Exam  Constitutional: He is oriented to person, place, and time. He  appears well-developed and well-nourished.  HENT:  Head: Normocephalic and atraumatic.  Eyes: EOM are normal.  Neck: Normal range of motion.  Cardiovascular: Normal rate.   Pulmonary/Chest: Effort normal.  Musculoskeletal: Normal range of motion.  Neurological: He is alert and oriented to person, place, and time.  Skin: Skin is warm and dry. Laceration noted.       Laceration to right wrist that is approximately 5 cm in length. There is arterial bleeding which I do not believe is the ulnar artery. The flexor tendons are all intact. Capillary refill, sensory and motor are all intact distally.   Psychiatric: He has a normal mood and affect. His behavior is normal.    ED Course  Procedures (including critical care time) DIAGNOSTIC STUDIES: Oxygen Saturation is 98% on room air, normal by my interpretation.    COORDINATION OF CARE: 6:32 PM- Patient informed of current plan for treatment and evaluation and agrees with plan at this time. Patient's niece is bedside.  6:38 PM- Placed sutures at laceration site on right forearm. LACERATION REPAIR PROCEDURE NOTE The patient's identification was confirmed and consent was obtained. This procedure was performed by Geoffery Lyons, MD at 6:38 PM. Site: right wrist Sterile procedures observed: yes Anesthetic used (type and amt): 5 cc of 1% lido wo epi Suture type/size: 4-0 prolene Length:5 cm # of Sutures: 11 Technique: simple, interrupted Complexity: moderate Antibx ointment applied Tetanus UTD or ordered: UTD Site anesthetized, irrigated with NS, explored without evidence of foreign body, wound well approximated, site covered with dry, sterile dressing.  Patient tolerated procedure well without complications. Instructions for care discussed verbally and patient provided with additional written instructions for homecare and f/u.  No diagnosis found.    MDM  The patient presents with arterial bleeding after lacerating his distal  forearm.  The wound was explored and did not involve the tendons.  I do not believe the bleeding was a result of lacerating the ulnar artery itself, but more likely a more superficial branch.  The bleeding was controlled with direct pressure, then repair was performed as above.  After repair, the ulnar pulse was present and flexion of the fingers and sensation was again tested.  These remained intact.  A dressing was applied and the patient will be discharged with keflex, pain meds, and follow up tomorrow with orthopedics.  I spoke with Dr. Melvyn Novas who was on call for hand.  He was okay with my treatment and agrees to follow up the patient tomorrow.        I personally performed the services described in this documentation, which was scribed in my presence. The recorded information has been reviewed and is accurate.      Geoffery Lyons, MD 01/17/12 [phone]

## 2012-01-25 ENCOUNTER — Telehealth: Payer: Self-pay | Admitting: Internal Medicine

## 2012-01-25 NOTE — Telephone Encounter (Signed)
called and moved pts appt due to a tx pt,pt aware    Rodney Green

## 2012-01-27 ENCOUNTER — Emergency Department (HOSPITAL_COMMUNITY): Payer: 59

## 2012-01-27 ENCOUNTER — Emergency Department (HOSPITAL_COMMUNITY)
Admission: EM | Admit: 2012-01-27 | Discharge: 2012-01-27 | Disposition: A | Discharge status: Home / Self Care | Payer: 59 | Attending: Emergency Medicine | Admitting: Emergency Medicine

## 2012-01-27 ENCOUNTER — Encounter (HOSPITAL_COMMUNITY): Payer: Self-pay

## 2012-01-27 DIAGNOSIS — I129 Hypertensive chronic kidney disease with stage 1 through stage 4 chronic kidney disease, or unspecified chronic kidney disease: Secondary | ICD-10-CM | POA: Insufficient documentation

## 2012-01-27 DIAGNOSIS — Z87891 Personal history of nicotine dependence: Secondary | ICD-10-CM | POA: Insufficient documentation

## 2012-01-27 DIAGNOSIS — Z8619 Personal history of other infectious and parasitic diseases: Secondary | ICD-10-CM | POA: Insufficient documentation

## 2012-01-27 DIAGNOSIS — N189 Chronic kidney disease, unspecified: Secondary | ICD-10-CM | POA: Insufficient documentation

## 2012-01-27 DIAGNOSIS — Z79899 Other long term (current) drug therapy: Secondary | ICD-10-CM | POA: Insufficient documentation

## 2012-01-27 DIAGNOSIS — C9000 Multiple myeloma not having achieved remission: Secondary | ICD-10-CM | POA: Insufficient documentation

## 2012-01-27 DIAGNOSIS — Z794 Long term (current) use of insulin: Secondary | ICD-10-CM | POA: Insufficient documentation

## 2012-01-27 DIAGNOSIS — E119 Type 2 diabetes mellitus without complications: Secondary | ICD-10-CM | POA: Insufficient documentation

## 2012-01-27 DIAGNOSIS — R4182 Altered mental status, unspecified: Secondary | ICD-10-CM

## 2012-01-27 DIAGNOSIS — Z7982 Long term (current) use of aspirin: Secondary | ICD-10-CM | POA: Insufficient documentation

## 2012-01-27 HISTORY — DX: Multiple myeloma not having achieved remission: C90.00

## 2012-01-27 LAB — CBC WITH DIFFERENTIAL/PLATELET
Basophils Relative: 2 % — ABNORMAL HIGH (ref 0–1)
Eosinophils Absolute: 0 10*3/uL (ref 0.0–0.7)
HCT: 31.1 % — ABNORMAL LOW (ref 39.0–52.0)
Hemoglobin: 10.6 g/dL — ABNORMAL LOW (ref 13.0–17.0)
Lymphs Abs: 0.4 10*3/uL — ABNORMAL LOW (ref 0.7–4.0)
MCH: 30.1 pg (ref 26.0–34.0)
MCHC: 34.1 g/dL (ref 30.0–36.0)
MCV: 88.4 fL (ref 78.0–100.0)
Monocytes Absolute: 0.3 10*3/uL (ref 0.1–1.0)
Monocytes Relative: 11 % (ref 3–12)
Neutrophils Relative %: 73 % (ref 43–77)
RBC: 3.52 MIL/uL — ABNORMAL LOW (ref 4.22–5.81)

## 2012-01-27 LAB — COMPREHENSIVE METABOLIC PANEL
Alkaline Phosphatase: 101 U/L (ref 39–117)
BUN: 33 mg/dL — ABNORMAL HIGH (ref 6–23)
Creatinine, Ser: 1.53 mg/dL — ABNORMAL HIGH (ref 0.50–1.35)
GFR calc Af Amer: 54 mL/min — ABNORMAL LOW (ref >90)
Glucose, Bld: 300 mg/dL — ABNORMAL HIGH (ref 70–99)
Potassium: 4.3 mEq/L (ref 3.5–5.1)
Total Bilirubin: 0.4 mg/dL (ref 0.3–1.2)
Total Protein: 6.6 g/dL (ref 6.0–8.3)

## 2012-01-27 MED ORDER — ALPRAZOLAM 0.5 MG PO TABS: 0.5000 mg | ORAL_TABLET | Freq: Every evening | ORAL | Status: DC | PRN

## 2012-01-27 NOTE — ED Notes (Addendum)
Pt's friend at bedside c/o pt's getting very agitated and restless. Wanting to know how much longer the CXR and CT results will be.

## 2012-01-27 NOTE — ED Notes (Signed)
AVW:[id number]<XB> Expected date:<BR> Expected time:<BR> Means of arrival:<BR> Comments:<BR> Triage (Reny)

## 2012-01-27 NOTE — ED Provider Notes (Signed)
History     CSN: [id number]  Arrival date & time 01/27/12  1421   First MD Initiated Contact with Patient 01/27/12 1546      Chief Complaint  Patient presents with  . Weakness    (Consider location/radiation/quality/duration/timing/severity/associated sxs/prior treatment) HPI Comments: Patient with history of multiple myeloma followed by Dr. Shirline Frees.  He was due to have a procedure today to repair a nerve injury to his wrist he sustained from a laceration several weeks ago.  The wife reported that he has been more restless and not quite himself.  He has had difficulty sleeping and just does not feel well.  He denies any pain.  There is no fever or chills.  No new medications except keflex and percocet which he has taken very few of.    Patient is a 66 y.o. male presenting with weakness. The history is provided by the patient.  Weakness Episode onset: 3 days ago. The symptoms are worsening. The neurological symptoms are diffuse.  Additional symptoms include weakness.    Past Medical History  Diagnosis Date  . Allergy   . Diabetes mellitus   . Hypertension   . Hepatitis C   . Multiple myeloma(203.0)   . Chronic kidney disease   . Multiple myeloma     Past Surgical History  Procedure Date  . Circumcision   . Hand surgery     Family History  Problem Relation Age of Onset  . Cancer Sister   . Diabetes Other   . Hypertension Other     History  Substance Use Topics  . Smoking status: Former Smoker -- 0.5 packs/day for 44 years    Types: Cigarettes    Quit date: 01/24/2008  . Smokeless tobacco: Never Used  . Alcohol Use: Yes     Comment: occ      Review of Systems  Neurological: Positive for weakness.  All other systems reviewed and are negative.    Allergies  Ace inhibitors  Home Medications   Current Outpatient Rx  Name  Route  Sig  Dispense  Refill  . AMLODIPINE BESYLATE 5 MG PO TABS   Oral   Take 5 mg by mouth daily.           . ASPIRIN 81 MG  PO TABS   Oral   Take 81 mg by mouth daily.         . ATENOLOL 100 MG PO TABS   Oral   Take 100 mg by mouth daily.           . CEPHALEXIN 500 MG PO CAPS   Oral   Take 1 capsule (500 mg total) by mouth 4 (four) times daily.   40 capsule   0   . DEXAMETHASONE 4 MG PO TABS   Oral   Take 40 mg by mouth once a week. On fridays         . DOCUSATE SODIUM 100 MG PO CAPS   Oral   Take 200-300 mg by mouth 2 (two) times daily. Takes 2 capsules(200mg ) every morning and 3 capsules(300mg ) every night         . DOXYCYCLINE HYCLATE 100 MG PO CAPS   Oral   Take 100 mg by mouth Twice daily.         Marland Kitchen FILGRASTIM 480 MCG/0.8ML IJ SOLN   Subcutaneous   Inject 0.8 mLs (480 mcg total) into the skin as directed.   10 Syringe   1   . INSULIN GLARGINE  100 UNIT/ML [place] SOLN   Subcutaneous   Inject 64 Units into the skin at bedtime.          Marland Kitchen NOVOLOG MIX 70/30 (70-30) 100 UNIT/ML [place] SUSP   Subcutaneous   Inject 2-28 Units into the skin once a week. Per sliding scale.Marland KitchenMarland KitchenOnly takes morning after taking dexamethasone         . OXYCODONE HCL ER 40 MG PO TB12   Oral   Take 1 tablet (40 mg total) by mouth every 12 (twelve) hours.   60 tablet   0   . OXYCODONE-ACETAMINOPHEN 5-325 MG PO TABS   Oral   Take 1-2 tablets by mouth every 6 (six) hours as needed for pain.   20 tablet   0   . POMALIDOMIDE 4 MG PO CAPS   Oral   Take 1 capsule (4 mg total) by mouth daily.   21 capsule   0     ADULT MALE/ LEFT MESSAGE ON PT'S HOME VOICE MAIL C ...   . SENNOSIDES 8.6 MG PO TABS   Oral   Take 1 tablet by mouth daily.         Marland Kitchen SIMVASTATIN 10 MG PO TABS   Oral   Take 10 mg by mouth at bedtime.           . TAMSULOSIN HCL 0.4 MG PO CAPS   Oral   Take 1 capsule (0.4 mg total) by mouth daily after supper.   30 capsule   2   . VALSARTAN-HYDROCHLOROTHIAZIDE 160-12.5 MG PO TABS   Oral   Take 1 tablet by mouth daily.             BP 102/62  Pulse 75  Temp 98.6 F (37 C)  (Oral)  Resp 18  SpO2 96%  Physical Exam  Nursing note and vitals reviewed. Constitutional: He is oriented to person, place, and time. He appears well-developed and well-nourished. No distress.  HENT:  Head: Normocephalic and atraumatic.  Mouth/Throat: Oropharynx is clear and moist.  Eyes: EOM are normal. Pupils are equal, round, and reactive to light.  Neck: Normal range of motion. Neck supple.  Cardiovascular: Normal rate and regular rhythm.   No murmur heard. Pulmonary/Chest: Effort normal and breath sounds normal. No respiratory distress. He has no wheezes.  Abdominal: Soft. Bowel sounds are normal. He exhibits no distension. There is no tenderness.  Musculoskeletal: Normal range of motion. He exhibits no edema.  Neurological: He is alert and oriented to person, place, and time.  Skin: Skin is warm and dry. He is not diaphoretic.    ED Course  Procedures (including critical care time)   Labs Reviewed  CBC WITH DIFFERENTIAL  COMPREHENSIVE METABOLIC PANEL   No results found.   No diagnosis found.    MDM  The patient presents with weakness, restlessness for the past several days.  He denies any pain, discomfort, or fever.  He does have a history of multiple myeloma and has had a low Calcium in the past.  However the electrolyte panel today does not reflect this.  His chest xray is clear.  Head ct is negative and the chest xray is negative for infiltrate.  At this point, I am unable to find an explanation for this with what I feel is an appropriate workup.  He appears well and seems appropriate for discharge.          Geoffery Lyons, MD 01/27/12 [phone]

## 2012-01-27 NOTE — ED Notes (Signed)
Patient has Multiple myeloma. Patient c/o feeling weak. Patient was to have an outpatient orthopedic procedure completed today and was canceled due to patient feeling lethargic. Patient also c/o chills.

## 2012-01-28 ENCOUNTER — Telehealth: Payer: Self-pay | Admitting: *Deleted

## 2012-01-28 NOTE — Telephone Encounter (Signed)
Pt's wife called wanting to make an appt with Adrena regarding pt's status changes.  Pt has increased agitation, restless, dizzy spells, not sleeping well, no energy and is having some increased pain in his hips for the past 2-3 days.  He is due to have a procedure done on his wrist after lacerating the nerve but she is worried about his current state.  Pt was supposed to have procedure done yesterday but he checked into the ED.  A full evaluation was done and there was nothing definitive found to be wrong with the pt.  Per Dr Donnald Garre, pt may stop pomalyst to see if symptoms improve and is to continue to hold until f/u with AJ.  He is scheduled to see Tiana Loft on 12/17 and he can be assessed at that time.  Hope verbalized understanding and is to call if anything changes in the meantime.  SLJ

## 2012-01-29 ENCOUNTER — Encounter: Payer: Self-pay | Admitting: *Deleted

## 2012-01-29 ENCOUNTER — Telehealth: Payer: Self-pay | Admitting: *Deleted

## 2012-01-29 NOTE — Telephone Encounter (Signed)
Pt's wife called and left msg that she was very concerned that pt's change in status is r/t depression.  She stated that family came over and they all feel that depression is what is causing his issues that were documented in yesterdays telephone call with desk RN.  Spoke with Kathrin Penner, social worker to assess further to see what may need to be done to help pt.  Dr Donnald Garre aware.  SLJ

## 2012-01-29 NOTE — Progress Notes (Signed)
[facility]  Clinical Social Work  Clinical Social Work was referred by Kathlee Nations for assessment and guidance regarding patient's current behaviors. CSW spoke with [facility], patient's spouse, who shares Mr. Lashley is currently easily agitated, experiencing racing thoughts, restlessness, difficulty sleeping, and fatigue.  She shares they recently went to emergency room and ruled out any medical concerns.  CSW explored patient's response to current situation; Mrs. Crist states he recognizes something is wrong and would just like to feel better. CSW recommends patient receive mental health evaluation and gave contact information for [facility].  Mrs. Yakubov plans to contact CSW once appointment has been made.  Kathrin Penner, MSW, LCSW Clinical Social Worker [facility] [phone]

## 2012-02-01 ENCOUNTER — Other Ambulatory Visit: Payer: 59 | Admitting: Lab

## 2012-02-01 ENCOUNTER — Telehealth: Payer: Self-pay | Admitting: Internal Medicine

## 2012-02-01 NOTE — Telephone Encounter (Signed)
called to change todays lab as he is having hand surg today and will come in on 12/10

## 2012-02-02 ENCOUNTER — Telehealth: Payer: Self-pay | Admitting: Medical Oncology

## 2012-02-02 ENCOUNTER — Telehealth: Payer: Self-pay | Admitting: *Deleted

## 2012-02-02 ENCOUNTER — Other Ambulatory Visit: Payer: 59 | Admitting: Lab

## 2012-02-02 ENCOUNTER — Ambulatory Visit (INDEPENDENT_AMBULATORY_CARE_PROVIDER_SITE_OTHER): Payer: 59 | Admitting: Professional

## 2012-02-02 DIAGNOSIS — F4323 Adjustment disorder with mixed anxiety and depressed mood: Secondary | ICD-10-CM

## 2012-02-02 NOTE — Telephone Encounter (Signed)
per orders from 02-02-2012 changed lab only appointment to 02-05-2012 at 10:45am  Patient confirmed over the phone the new date and time on 02-05-2012 at 10:45am lab only

## 2012-02-02 NOTE — Telephone Encounter (Signed)
R/s lab to Friday -pt post op from wrist surgery

## 2012-02-05 ENCOUNTER — Other Ambulatory Visit (HOSPITAL_BASED_OUTPATIENT_CLINIC_OR_DEPARTMENT_OTHER): Payer: 59 | Admitting: Lab

## 2012-02-05 DIAGNOSIS — C9002 Multiple myeloma in relapse: Secondary | ICD-10-CM

## 2012-02-05 LAB — COMPREHENSIVE METABOLIC PANEL (CC13)
ALT: 14 U/L (ref 0–55)
Albumin: 2.7 g/dL — ABNORMAL LOW (ref 3.5–5.0)
CO2: 24 mEq/L (ref 22–29)
Calcium: 9.1 mg/dL (ref 8.4–10.4)
Chloride: 104 mEq/L (ref 98–107)
Sodium: 136 mEq/L (ref 136–145)
Total Protein: 6 g/dL — ABNORMAL LOW (ref 6.4–8.3)

## 2012-02-05 LAB — CBC WITH DIFFERENTIAL/PLATELET
BASO%: 2.1 % — ABNORMAL HIGH (ref 0.0–2.0)
Eosinophils Absolute: 0 10*3/uL (ref 0.0–0.5)
HCT: 31.7 % — ABNORMAL LOW (ref 38.4–49.9)
MCHC: 34.2 g/dL (ref 32.0–36.0)
MONO#: 0.3 10*3/uL (ref 0.1–0.9)
NEUT#: 0.8 10*3/uL — ABNORMAL LOW (ref 1.5–6.5)
RBC: 3.43 10*6/uL — ABNORMAL LOW (ref 4.20–5.82)
WBC: 1.5 10*3/uL — ABNORMAL LOW (ref 4.0–10.3)
lymph#: 0.3 10*3/uL — ABNORMAL LOW (ref 0.9–3.3)

## 2012-02-05 LAB — LACTATE DEHYDROGENASE (CC13): LDH: 250 U/L — ABNORMAL HIGH (ref 125–245)

## 2012-02-08 ENCOUNTER — Ambulatory Visit: Payer: 59 | Admitting: Physician Assistant

## 2012-02-09 ENCOUNTER — Telehealth: Payer: Self-pay | Admitting: Internal Medicine

## 2012-02-09 ENCOUNTER — Ambulatory Visit: Payer: 59 | Admitting: Professional

## 2012-02-09 ENCOUNTER — Ambulatory Visit (HOSPITAL_BASED_OUTPATIENT_CLINIC_OR_DEPARTMENT_OTHER): Payer: 59 | Admitting: Physician Assistant

## 2012-02-09 DIAGNOSIS — C9000 Multiple myeloma not having achieved remission: Secondary | ICD-10-CM

## 2012-02-09 DIAGNOSIS — D709 Neutropenia, unspecified: Secondary | ICD-10-CM

## 2012-02-09 DIAGNOSIS — R52 Pain, unspecified: Secondary | ICD-10-CM

## 2012-02-09 MED ORDER — DEXAMETHASONE 4 MG PO TABS: 40.0000 mg | ORAL_TABLET | ORAL | Status: DC

## 2012-02-09 MED ORDER — OXYCODONE HCL 40 MG PO TB12: 40.0000 mg | ORAL_TABLET | Freq: Two times a day (BID) | ORAL | Status: DC

## 2012-02-09 NOTE — Telephone Encounter (Signed)
Gave pt appt for January 2014 lab, ML and chemo

## 2012-02-09 NOTE — Progress Notes (Signed)
[facility] [phone]   [phone]  OFFICE PROGRESS NOTE  Katy Apo, MD [address] [employer] [address] [place] [address]  DIAGNOSIS:  Multiple myeloma IgA subtype diagnosed in November, 2009.   PRIOR THERAPY:  1. Status post seven cycles of systemic chemotherapy with Revlimid and low-dose Betatron with partial response. The last dose was given June, 2010. 2. Status post chemotherapy with weekly Velcade in a patient on Decadron 40 mg on a weekly basis, status post 75 weekly doses. The last dose was given May 04, 2008. 3. Status post treatment again with Velcade and Decadron. The patient was awaiting stem cell transplant at [facility]. Last dose was given Jul 16, 2009. 4. The patient was not considered a good candidate for autologous stem cell transplant at that time secondary to poor mobilization of his stem cells.  5. Status post six cycles of systemic chemotherapy with Velcade, Cytoxan and Betatron. Last dose was given March 25, 2010. This continued secondary to his disease progression. 6. Status post ten cycles of systemic chemotherapy with Velcade. Doxil and Decadron. Last dose was given on 10/31/2010. Currently on hold because of concern about cardiac toxicity.  CURRENT THERAPY:  1. Pomalyst 4 mg by mouth daily for 21 days every 28 days, completed cycle #8. 2. Decadron 40 mg by mouth weekly  INTERVAL HISTORY: Rodney Green 66 y.o. male returns to the clinic today for routine followup visit accompanied by his wife. The patient is tolerating his current treatment with Pomalyst and Decadron fairly well with no significant adverse effects except for occasional aching pain after the Neupogen injection which he had recently secondary to significant neutropenia. He denied having any significant weight loss or night sweats. He denied having any chest pain, shortness of breath, cough or hemoptysis. No bleeding issues. He  completed his last cycle on 02/05/2012 and is due to restart the next cycle approximately 02/12/2012 depending on when he receives delivery of his Pomalyst. He requests a refill prescriptions for his dexamethasone and his OxyContin 40 mg tablets.   MEDICAL HISTORY: Past Medical History  Diagnosis Date  . Allergy   . Diabetes mellitus   . Hypertension   . Hepatitis C   . Multiple myeloma(203.0)   . Chronic kidney disease   . Multiple myeloma     ALLERGIES:  is allergic to ace inhibitors.  MEDICATIONS:  Current Outpatient Prescriptions  Medication Sig Dispense Refill  . amLODipine (NORVASC) 5 MG tablet Take 5 mg by mouth daily.        Marland Kitchen aspirin 81 MG tablet Take 81 mg by mouth daily.      Marland Kitchen atenolol (TENORMIN) 100 MG tablet Take 100 mg by mouth daily.        Marland Kitchen dexamethasone (DECADRON) 4 MG tablet Take 10 tablets (40 mg total) by mouth once a week. On fridays  40 tablet  3  . docusate sodium (COLACE) 100 MG capsule Take 200-300 mg by mouth 2 (two) times daily. Takes 2 capsules(200mg ) every morning and 3 capsules(300mg ) every night      . filgrastim (NEUPOGEN) 480 MCG/0.8ML SOLN injection Inject 0.8 mLs (480 mcg total) into the skin as directed.  10 Syringe  1  . HYDROmorphone (DILAUDID) 2 MG tablet       . insulin glargine (LANTUS) 100 UNIT/ML injection Inject 64 Units into the skin at bedtime.       Marland Kitchen NOVOLOG MIX 70/30 (70-30) 100 UNIT/ML  injection Inject 2-28 Units into the skin once a week. Per sliding scale.Marland KitchenMarland KitchenOnly takes morning after taking dexamethasone      . oxyCODONE (OXYCONTIN) 40 MG 12 hr tablet Take 1 tablet (40 mg total) by mouth every 12 (twelve) hours.  60 tablet  0  . pomalidomide (POMALYST) 4 MG capsule Take 1 capsule (4 mg total) by mouth daily.  21 capsule  0  . senna (SENOKOT) 8.6 MG tablet Take 1 tablet by mouth daily.      . simvastatin (ZOCOR) 10 MG tablet Take 10 mg by mouth at bedtime.        . Tamsulosin HCl (FLOMAX) 0.4 MG CAPS Take 1 capsule (0.4 mg total) by  mouth daily after supper.  30 capsule  2  . valsartan-hydrochlorothiazide (DIOVAN-HCT) 160-12.5 MG per tablet Take 1 tablet by mouth daily.          SURGICAL HISTORY:  Past Surgical History  Procedure Date  . Circumcision   . Hand surgery     REVIEW OF SYSTEMS:  A comprehensive review of systems was negative except for: Constitutional: positive for fatigue Musculoskeletal: positive for arthralgias   PHYSICAL EXAMINATION: General appearance: alert, cooperative and no distress Head: Normocephalic, without obvious abnormality, atraumatic Neck: no adenopathy Resp: clear to auscultation bilaterally Cardio: regular rate and rhythm, S1, S2 normal, no murmur, click, rub or gallop GI: soft, non-tender; bowel sounds normal; no masses,  no organomegaly Extremities: extremities normal, atraumatic, no cyanosis or edema Neurologic: Alert and oriented X 3, normal strength and tone. Normal symmetric reflexes. Normal coordination and gait  ECOG PERFORMANCE STATUS: 1 - Symptomatic but completely ambulatory  There were no vitals taken for this visit.  LABORATORY DATA: Lab Results  Component Value Date   WBC 1.5* 02/05/2012   HGB 10.8* 02/05/2012   HCT 31.7* 02/05/2012   MCV 92.4 02/05/2012   PLT 155 02/05/2012      Chemistry      Component Value Date/Time   NA 136 02/05/2012 1124   NA 133* 01/27/2012 1655   K 4.7 02/05/2012 1124   K 4.3 01/27/2012 1655   CL 104 02/05/2012 1124   CL 97 01/27/2012 1655   CO2 24 02/05/2012 1124   CO2 26 01/27/2012 1655   BUN 40.0* 02/05/2012 1124   BUN 33* 01/27/2012 1655   CREATININE 1.9* 02/05/2012 1124   CREATININE 1.53* 01/27/2012 1655   GLU 293* 08/12/2009 1322      Component Value Date/Time   CALCIUM 9.1 02/05/2012 1124   CALCIUM 9.7 01/27/2012 1655   ALKPHOS 124 02/05/2012 1124   ALKPHOS 101 01/27/2012 1655   AST 13 02/05/2012 1124   AST 23 01/27/2012 1655   ALT 14 02/05/2012 1124   ALT 15 01/27/2012 1655   BILITOT 0.65 02/05/2012 1124   BILITOT  0.4 01/27/2012 1655     Myeloma panel: Beta-2 microglobulin 4.58, free kappa light chain 2. 53, free lambda light chain 28.90, kappa/lambda ratio 0.09, IgG 768, IgA 437, and IgM 30  RADIOGRAPHIC STUDIES: No results found.  ASSESSMENT/PLAN: This is a very pleasant 66 years old [place] American male with history of multiple myeloma currently on treatment with Pomalyst and Decadron status post 8 cycles. He is tolerating his treatment fairly well and he has no evidence for disease progression on his recent blood work. The patient was discussed with Dr. Arbutus Ped. His ANC is subtherapeutic at 0.8 and he will require Neupogen 480 mg subcutaneously for the next 3 days. Patient currently has refills  on his existing prescription that is now being serviced through [employer]. He will continue on the palm list and dexamethasone as prescribed and return in one month with a repeat CBC differential, C. met and LDH for another symptom management visit. He'll also resume Zometa however this will now be given every 2 months beginning January 2014.  Rodney Green, Rodney Rottmann E, PA-C   All questions were answered. The patient knows to call the clinic with any problems, questions or concerns. We can certainly see the patient much sooner if necessary.  I spent 20 minutes counseling the patient face to face. The total time spent in the appointment was 30 minutes.

## 2012-02-09 NOTE — Progress Notes (Signed)
Spoke with [facility] regarding patient's Neupogen prescription. Due to patient's insurance, pt will be given 4 syringes for 10 day supply. Pt to call today to request a refill. After today, pt will have 3 refills remaining. [facility] # [phone]

## 2012-02-09 NOTE — Patient Instructions (Addendum)
Continue Pomalyst and Dexamethasone as prescribed Take Neupogen as directed Follow up in 1 month

## 2012-02-10 ENCOUNTER — Other Ambulatory Visit: Payer: Self-pay | Admitting: *Deleted

## 2012-02-10 DIAGNOSIS — C9000 Multiple myeloma not having achieved remission: Secondary | ICD-10-CM

## 2012-02-10 MED ORDER — POMALIDOMIDE 4 MG PO CAPS: 4.0000 mg | ORAL_CAPSULE | Freq: Every day | ORAL | Status: DC

## 2012-02-11 ENCOUNTER — Other Ambulatory Visit: Payer: Self-pay | Admitting: *Deleted

## 2012-02-11 NOTE — Telephone Encounter (Signed)
THIS REFILL REQUEST FOR POMALYST WAS GIVEN TO DR.MOHAMED'S STEPHANIE JOHNSON,RN.

## 2012-02-12 NOTE — Telephone Encounter (Signed)
RECEIVED A FAX FROM [employer] CONCERNING A CONFIRMATION OF PRESCRIPTION SHIPMENT FOR POMALYST ON 02/11/12.

## 2012-02-22 ENCOUNTER — Other Ambulatory Visit (HOSPITAL_BASED_OUTPATIENT_CLINIC_OR_DEPARTMENT_OTHER): Payer: 59 | Admitting: Lab

## 2012-02-22 DIAGNOSIS — C9002 Multiple myeloma in relapse: Secondary | ICD-10-CM

## 2012-02-22 LAB — CBC WITH DIFFERENTIAL/PLATELET
BASO%: 1.8 % (ref 0.0–2.0)
EOS%: 7 % (ref 0.0–7.0)
MCH: 30.8 pg (ref 27.2–33.4)
MCHC: 33 g/dL (ref 32.0–36.0)
NEUT%: 71.6 % (ref 39.0–75.0)
RBC: 3.73 10*6/uL — ABNORMAL LOW (ref 4.20–5.82)
RDW: 17 % — ABNORMAL HIGH (ref 11.0–14.6)
lymph#: 0.2 10*3/uL — ABNORMAL LOW (ref 0.9–3.3)

## 2012-02-22 LAB — COMPREHENSIVE METABOLIC PANEL (CC13)
ALT: 9 U/L (ref 0–55)
AST: 8 U/L (ref 5–34)
Calcium: 9.3 mg/dL (ref 8.4–10.4)
Chloride: 104 mEq/L (ref 98–107)
Creatinine: 1.8 mg/dL — ABNORMAL HIGH (ref 0.7–1.3)

## 2012-02-25 ENCOUNTER — Other Ambulatory Visit: Payer: Self-pay

## 2012-02-25 ENCOUNTER — Emergency Department (HOSPITAL_COMMUNITY): Payer: 59

## 2012-02-25 ENCOUNTER — Inpatient Hospital Stay (HOSPITAL_COMMUNITY): Payer: 59

## 2012-02-25 ENCOUNTER — Encounter (HOSPITAL_COMMUNITY): Payer: Self-pay | Admitting: Emergency Medicine

## 2012-02-25 ENCOUNTER — Inpatient Hospital Stay (HOSPITAL_COMMUNITY)
Admission: EM | Admit: 2012-02-25 | Discharge: 2012-03-03 | DRG: 193 | Disposition: A | Discharge status: Home / Self Care | Payer: 59 | Attending: Internal Medicine | Admitting: Internal Medicine

## 2012-02-25 DIAGNOSIS — E1129 Type 2 diabetes mellitus with other diabetic kidney complication: Secondary | ICD-10-CM | POA: Diagnosis present

## 2012-02-25 DIAGNOSIS — R339 Retention of urine, unspecified: Secondary | ICD-10-CM | POA: Diagnosis present

## 2012-02-25 DIAGNOSIS — B1920 Unspecified viral hepatitis C without hepatic coma: Secondary | ICD-10-CM | POA: Diagnosis present

## 2012-02-25 DIAGNOSIS — Z79899 Other long term (current) drug therapy: Secondary | ICD-10-CM

## 2012-02-25 DIAGNOSIS — N183 Chronic kidney disease, stage 3 unspecified: Secondary | ICD-10-CM | POA: Diagnosis present

## 2012-02-25 DIAGNOSIS — R52 Pain, unspecified: Secondary | ICD-10-CM

## 2012-02-25 DIAGNOSIS — R4182 Altered mental status, unspecified: Secondary | ICD-10-CM

## 2012-02-25 DIAGNOSIS — I455 Other specified heart block: Secondary | ICD-10-CM | POA: Diagnosis not present

## 2012-02-25 DIAGNOSIS — J9600 Acute respiratory failure, unspecified whether with hypoxia or hypercapnia: Secondary | ICD-10-CM | POA: Diagnosis present

## 2012-02-25 DIAGNOSIS — C9000 Multiple myeloma not having achieved remission: Secondary | ICD-10-CM | POA: Diagnosis present

## 2012-02-25 DIAGNOSIS — Z87891 Personal history of nicotine dependence: Secondary | ICD-10-CM

## 2012-02-25 DIAGNOSIS — R0981 Nasal congestion: Secondary | ICD-10-CM | POA: Clinically undetermined

## 2012-02-25 DIAGNOSIS — E119 Type 2 diabetes mellitus without complications: Secondary | ICD-10-CM | POA: Diagnosis present

## 2012-02-25 DIAGNOSIS — Z794 Long term (current) use of insulin: Secondary | ICD-10-CM

## 2012-02-25 DIAGNOSIS — D709 Neutropenia, unspecified: Secondary | ICD-10-CM | POA: Diagnosis present

## 2012-02-25 DIAGNOSIS — R0902 Hypoxemia: Secondary | ICD-10-CM

## 2012-02-25 DIAGNOSIS — E871 Hypo-osmolality and hyponatremia: Secondary | ICD-10-CM | POA: Diagnosis present

## 2012-02-25 DIAGNOSIS — G894 Chronic pain syndrome: Secondary | ICD-10-CM | POA: Diagnosis present

## 2012-02-25 DIAGNOSIS — I129 Hypertensive chronic kidney disease with stage 1 through stage 4 chronic kidney disease, or unspecified chronic kidney disease: Secondary | ICD-10-CM | POA: Diagnosis present

## 2012-02-25 DIAGNOSIS — D899 Disorder involving the immune mechanism, unspecified: Secondary | ICD-10-CM | POA: Diagnosis present

## 2012-02-25 DIAGNOSIS — J189 Pneumonia, unspecified organism: Principal | ICD-10-CM | POA: Diagnosis present

## 2012-02-25 DIAGNOSIS — Z7982 Long term (current) use of aspirin: Secondary | ICD-10-CM

## 2012-02-25 DIAGNOSIS — J9601 Acute respiratory failure with hypoxia: Secondary | ICD-10-CM

## 2012-02-25 DIAGNOSIS — C9002 Multiple myeloma in relapse: Secondary | ICD-10-CM

## 2012-02-25 LAB — CBC WITH DIFFERENTIAL/PLATELET
Basophils Relative: 2 % — ABNORMAL HIGH (ref 0–1)
Eosinophils Relative: 6 % — ABNORMAL HIGH (ref 0–5)
Hemoglobin: 11.7 g/dL — ABNORMAL LOW (ref 13.0–17.0)
Lymphs Abs: 0.4 10*3/uL — ABNORMAL LOW (ref 0.7–4.0)
MCH: 28.9 pg (ref 26.0–34.0)
MCV: 89.6 fL (ref 78.0–100.0)
Monocytes Absolute: 0.2 10*3/uL (ref 0.1–1.0)
Neutro Abs: 0.8 10*3/uL — ABNORMAL LOW (ref 1.7–7.7)
Platelets: 171 10*3/uL (ref 150–400)
RBC: 4.05 MIL/uL — ABNORMAL LOW (ref 4.22–5.81)

## 2012-02-25 LAB — BASIC METABOLIC PANEL
BUN: 31 mg/dL — ABNORMAL HIGH (ref 6–23)
CO2: 24 mEq/L (ref 19–32)
Calcium: 9.6 mg/dL (ref 8.4–10.5)
Creatinine, Ser: 1.74 mg/dL — ABNORMAL HIGH (ref 0.50–1.35)
Glucose, Bld: 202 mg/dL — ABNORMAL HIGH (ref 70–99)

## 2012-02-25 LAB — BLOOD GAS, ARTERIAL
Drawn by: 336861
FIO2: 1 %
Patient temperature: 98.6
TCO2: 20.9 mmol/L (ref 0–100)
pCO2 arterial: 34.6 mmHg — ABNORMAL LOW (ref 35.0–45.0)
pH, Arterial: 7.442 (ref 7.350–7.450)

## 2012-02-25 LAB — LACTIC ACID, PLASMA: Lactic Acid, Venous: 1.1 mmol/L (ref 0.5–2.2)

## 2012-02-25 MED ORDER — VANCOMYCIN HCL 1000 MG IV SOLR
750.0000 mg | Freq: Two times a day (BID) | INTRAVENOUS | Status: DC
  Administered 2012-02-26 – 2012-02-28 (×5): 750 mg via INTRAVENOUS
  Filled 2012-02-25 (×7): qty 750

## 2012-02-25 MED ORDER — TECHNETIUM TO 99M ALBUMIN AGGREGATED
5.7000 | Freq: Once | INTRAVENOUS | Status: AC | PRN
  Administered 2012-02-25: 5.7 via INTRAVENOUS

## 2012-02-25 MED ORDER — ENOXAPARIN SODIUM 100 MG/ML ~~LOC~~ SOLN
90.0000 mg | Freq: Once | SUBCUTANEOUS | Status: AC
  Administered 2012-02-26: 90 mg via SUBCUTANEOUS
  Filled 2012-02-25: qty 1

## 2012-02-25 MED ORDER — DEXTROSE 5 % IV SOLN
1.0000 g | INTRAVENOUS | Status: DC
  Administered 2012-02-26: 1 g via INTRAVENOUS
  Filled 2012-02-25: qty 1

## 2012-02-25 MED ORDER — LEVOFLOXACIN IN D5W 750 MG/150ML IV SOLN
750.0000 mg | INTRAVENOUS | Status: DC
  Administered 2012-02-26: 750 mg via INTRAVENOUS
  Filled 2012-02-25: qty 150

## 2012-02-25 MED ORDER — TECHNETIUM TC 99M DIETHYLENETRIAME-PENTAACETIC ACID: 44.6000 | Freq: Once | INTRAVENOUS | Status: AC | PRN

## 2012-02-25 MED ORDER — ENOXAPARIN SODIUM 100 MG/ML ~~LOC~~ SOLN
90.0000 mg | Freq: Two times a day (BID) | SUBCUTANEOUS | Status: DC
  Administered 2012-02-26: 90 mg via SUBCUTANEOUS
  Filled 2012-02-25 (×2): qty 1

## 2012-02-25 NOTE — ED Notes (Signed)
Pt c/o of SOB that has been going on for about 4-5 days but has gotten worse today. Dizzy upon walking, denies numbness and tingling in extremities.

## 2012-02-25 NOTE — Progress Notes (Signed)
ANTICOAGULATION CONSULT NOTE - Initial Consult  Pharmacy Consult for Lovenox Indication: R/O Pulmonary Embolism  Allergies  Allergen Reactions  . Ace Inhibitors     He is not sure about allergy to ace inhibitors    Patient Measurements: Height: 5' 6.93" (170 cm) Weight: 194 lb 7.1 oz (88.2 kg) IBW/kg (Calculated) : 65.94    Vital Signs: Temp: 97.7 F (36.5 C) (01/02 2000) Temp src: Oral (01/02 2000) BP: 104/69 mmHg (01/02 2000) Pulse Rate: 71  (01/02 2000)  Labs:  [facility] 02/25/12 1646  HGB 11.7*  HCT 36.3*  PLT 171  APTT --  LABPROT --  INR --  HEPARINUNFRC --  CREATININE 1.74*  CKTOTAL --  CKMB --  TROPONINI --    Estimated Creatinine Clearance: 45.4 ml/min (by C-G formula based on Cr of 1.74).   Medical History: Past Medical History  Diagnosis Date  . Allergy   . Diabetes mellitus   . Hypertension   . Hepatitis C   . Multiple myeloma(203.0)   . Chronic kidney disease   . Multiple myeloma     Medications:  Scheduled:    . enoxaparin (LOVENOX) injection  90 mg Subcutaneous Once  . enoxaparin (LOVENOX) injection  90 mg Subcutaneous Q12H   Infusions:    Assessment:  67 yr male with complaint of shortness of breath  Elevated D-Dimer (7.12)  Pulmonary ventilation and perfusion scan = low probability for acute PE  CrCl ~ 45 ml/min  Lovenox to begin empirically for R/O PE  Goal of Therapy:   Monitor platelets by anticoagulation protocol: Yes   Plan:   Lovenox 90mg  sq q12h  CBC q72hr  Maryellen Pile, PharmD 02/25/2012,11:08 PM

## 2012-02-25 NOTE — ED Notes (Signed)
Called Resp for ABG,  3E notified pt will not be admitted to this floor.

## 2012-02-25 NOTE — Progress Notes (Signed)
ANTIBIOTIC CONSULT NOTE - INITIAL  Pharmacy Consult for Vancomycin, Cefepime, Levaquin Indication: R/O PNA, Sepsis  Allergies  Allergen Reactions  . Ace Inhibitors     He is not sure about allergy to ace inhibitors    Patient Measurements: Height: 5' 6.93" (170 cm) Weight: 194 lb 7.1 oz (88.2 kg) IBW/kg (Calculated) : 65.94   Vital Signs: Temp: 97.7 F (36.5 C) (01/02 2000) Temp src: Oral (01/02 2000) BP: 104/69 mmHg (01/02 2000) Pulse Rate: 71  (01/02 2000) Intake/Output from previous day:   Intake/Output from this shift:    Labs:  [facility] 02/25/12 1646  WBC 1.5*  HGB 11.7*  PLT 171  LABCREA --  CREATININE 1.74*   Estimated Creatinine Clearance: 45.4 ml/min (by C-G formula based on Cr of 1.74). No results found for this basename: VANCOTROUGH:2,VANCOPEAK:2,VANCORANDOM:2,GENTTROUGH:2,GENTPEAK:2,GENTRANDOM:2,TOBRATROUGH:2,TOBRAPEAK:2,TOBRARND:2,AMIKACINPEAK:2,AMIKACINTROU:2,AMIKACIN:2, in the last 72 hours   Microbiology: No results found for this or any previous visit (from the past 720 hour(s)).  Medical History: Past Medical History  Diagnosis Date  . Allergy   . Diabetes mellitus   . Hypertension   . Hepatitis C   . Multiple myeloma(203.0)   . Chronic kidney disease   . Multiple myeloma     Medications:  Scheduled:    . enoxaparin (LOVENOX) injection  90 mg Subcutaneous Once  . enoxaparin (LOVENOX) injection  90 mg Subcutaneous Q12H   Infusions:   Assessment:  67 yr male with shortness of breath  Chest Xray = compatible with multifocal PNA  CrCl (CG) = 45 ml/min; CrCl (n) = 43 ml/min  Cefepime, Levaquin and Vancomycin per pharmacy ordered to begin for PNA, sepsis  Goal of Therapy:  Vancomycin trough level 15-20 mcg/ml  Plan:  Measure antibiotic drug levels at steady state Follow up culture results Levaquin 750mg  IV q48h Cefepime 1gm IV q24h Vancomycin 750 mg IV q12h Follow renal function and modify dosing regimens as  needed  Carely Nappier, Joselyn Glassman, PharmD 02/25/2012,11:20 PM

## 2012-02-25 NOTE — ED Notes (Signed)
Hospitalist at bedside speaking with pt

## 2012-02-25 NOTE — ED Provider Notes (Addendum)
History     CSN: [id number]  Arrival date & time 02/25/12  1426   First MD Initiated Contact with Patient 02/25/12 1536      Chief Complaint  Patient presents with  . Shortness of Breath    (Consider location/radiation/quality/duration/timing/severity/associated sxs/prior treatment) HPI Patient presents with shortness of breath onset several weeks ago but getting worse today. No treatment prior to coming here he denies associated symptoms such as cough fever or chest pain symptoms worse with exertion improved with remaining still  .Past Medical History  Diagnosis Date  . Allergy   . Diabetes mellitus   . Hypertension   . Hepatitis C   . Multiple myeloma(203.0)   . Chronic kidney disease   . Multiple myeloma     Past Surgical History  Procedure Date  . Circumcision   . Hand surgery     Family History  Problem Relation Age of Onset  . Cancer Sister   . Diabetes Other   . Hypertension Other     History  Substance Use Topics  . Smoking status: Former Smoker -- 0.5 packs/day for 44 years    Types: Cigarettes    Quit date: 01/24/2008  . Smokeless tobacco: Never Used  . Alcohol Use: Yes     Comment: occ      Review of Systems  Constitutional: Negative.   HENT: Negative.   Respiratory: Positive for shortness of breath.   Cardiovascular: Negative.   Gastrointestinal: Negative.   Musculoskeletal: Negative.   Skin: Negative.   Neurological: Negative.   Hematological: Negative.   Psychiatric/Behavioral: Negative.   All other systems reviewed and are negative.    Allergies  Ace inhibitors  Home Medications   Current Outpatient Rx  Name  Route  Sig  Dispense  Refill  . AMLODIPINE BESYLATE 5 MG PO TABS   Oral   Take 5 mg by mouth daily.           . ASPIRIN 81 MG PO TABS   Oral   Take 81 mg by mouth daily.         . ATENOLOL 100 MG PO TABS   Oral   Take 100 mg by mouth daily.           Marland Kitchen DEXAMETHASONE 4 MG PO TABS   Oral   Take 10 tablets  (40 mg total) by mouth once a week. On fridays   40 tablet   3   . DOCUSATE SODIUM 100 MG PO CAPS   Oral   Take 200-300 mg by mouth 2 (two) times daily. Takes 2 capsules(200mg ) every morning and 3 capsules(300mg ) every night         . FILGRASTIM 480 MCG/0.8ML IJ SOLN   Subcutaneous   Inject 0.8 mLs (480 mcg total) into the skin as directed.   10 Syringe   1   . HYDROMORPHONE HCL 2 MG PO TABS               . INSULIN GLARGINE 100 UNIT/ML [place] SOLN   Subcutaneous   Inject 64 Units into the skin at bedtime.          Marland Kitchen NOVOLOG MIX 70/30 (70-30) 100 UNIT/ML [place] SUSP   Subcutaneous   Inject 2-28 Units into the skin once a week. Per sliding scale.Marland KitchenMarland KitchenOnly takes morning after taking dexamethasone         . OXYCODONE HCL ER 40 MG PO TB12   Oral   Take 1 tablet (40 mg total) by  mouth every 12 (twelve) hours.   60 tablet   0   . POMALIDOMIDE 4 MG PO CAPS   Oral   Take 1 capsule (4 mg total) by mouth daily.   21 capsule   0     ADULT MALE/ LEFT MESSAGE ON PT'S HOME VOICE MAIL C ...   . SENNOSIDES 8.6 MG PO TABS   Oral   Take 1 tablet by mouth daily.         Marland Kitchen SIMVASTATIN 10 MG PO TABS   Oral   Take 10 mg by mouth at bedtime.           . TAMSULOSIN HCL 0.4 MG PO CAPS   Oral   Take 1 capsule (0.4 mg total) by mouth daily after supper.   30 capsule   2   . VALSARTAN-HYDROCHLOROTHIAZIDE 160-12.5 MG PO TABS   Oral   Take 1 tablet by mouth daily.             BP 123/87  Pulse 69  Temp 97.4 F (36.3 C)  Resp 20  SpO2 96%  Physical Exam  Nursing note and vitals reviewed. Constitutional: He is oriented to person, place, and time. He appears well-developed and well-nourished.  HENT:  Head: Normocephalic and atraumatic.  Eyes: Conjunctivae normal are normal. Pupils are equal, round, and reactive to light.  Neck: Neck supple. No tracheal deviation present. No thyromegaly present.  Cardiovascular: Normal rate and regular rhythm.   No murmur  heard. Pulmonary/Chest: Effort normal and breath sounds normal.  Abdominal: Soft. Bowel sounds are normal. He exhibits no distension. There is no tenderness.  Musculoskeletal: Normal range of motion. He exhibits no edema and no tenderness.  Neurological: He is alert and oriented to person, place, and time. Coordination normal.  Skin: Skin is warm and dry. No rash noted.  Psychiatric: He has a normal mood and affect.    ED Course  Procedures (including critical care time)   Labs Reviewed  D-DIMER, QUANTITATIVE  CBC WITH DIFFERENTIAL  BASIC METABOLIC PANEL   No results found.   No diagnosis found.    Date: 02/25/2012  Rate: 70  Rhythm: normal sinus rhythm  QRS Axis: normal  Intervals: normal  ST/T Wave abnormalities: normal  Conduction Disutrbances: none  Narrative Interpretation: unremarkable Results for orders placed during the hospital encounter of 02/25/12  D-DIMER, QUANTITATIVE      Component Value Range   D-Dimer, Quant 7.12 (*) 0.00 - 0.48 ug/mL-FEU  CBC WITH DIFFERENTIAL      Component Value Range   WBC 1.5 (*) 4.0 - 10.5 K/uL   RBC 4.05 (*) 4.22 - 5.81 MIL/uL   Hemoglobin 11.7 (*) 13.0 - 17.0 g/dL   HCT 40.9 (*) 81.1 - 91.4 %   MCV 89.6  78.0 - 100.0 fL   MCH 28.9  26.0 - 34.0 pg   MCHC 32.2  30.0 - 36.0 g/dL   RDW 78.2 (*) 95.6 - 21.3 %   Platelets 171  150 - 400 K/uL   Neutrophils Relative 53  43 - 77 %   Lymphocytes Relative 28  12 - 46 %   Monocytes Relative 11  3 - 12 %   Eosinophils Relative 6 (*) 0 - 5 %   Basophils Relative 2 (*) 0 - 1 %   Neutro Abs 0.8 (*) 1.7 - 7.7 K/uL   Lymphs Abs 0.4 (*) 0.7 - 4.0 K/uL   Monocytes Absolute 0.2  0.1 - 1.0 K/uL  Eosinophils Absolute 0.1  0.0 - 0.7 K/uL   Basophils Absolute 0.0  0.0 - 0.1 K/uL   RBC Morphology ELLIPTOCYTES    BASIC METABOLIC PANEL      Component Value Range   Sodium 134 (*) 135 - 145 mEq/L   Potassium 5.1  3.5 - 5.1 mEq/L   Chloride 99  96 - 112 mEq/L   CO2 24  19 - 32 mEq/L    Glucose, Bld 202 (*) 70 - 99 mg/dL   BUN 31 (*) 6 - 23 mg/dL   Creatinine, Ser 1.61 (*) 0.50 - 1.35 mg/dL   Calcium 9.6  8.4 - 09.6 mg/dL   GFR calc non Af Amer 40 (*) >90 mL/min   GFR calc Af Amer 46 (*) >90 mL/min   Dg Chest 2 View  02/25/2012  *RADIOLOGY REPORT*  Clinical Data: Short of breath  CHEST - 2 VIEW  Comparison: 01/27/2012  Findings: Low lung volumes and bibasilar scarring is again demonstrated.  Normal heart size.  No effusion or edema.  No airspace consolidation.  IMPRESSION:  1.  Chronic low lung volumes with bibasilar atelectasis.   Original Report Authenticated By: Signa Kell, M.D.    Dg Chest 2 View  01/27/2012  *RADIOLOGY REPORT*  Clinical Data: Weakness.  Chills.  Multiple myeloma.  CHEST - 2 VIEW  Comparison: 11/04/2011  Findings: Low lung volumes are again seen mild bibasilar atelectasis or scarring is demonstrated.  No evidence of pulmonary consolidation or edema.  No evidence of pleural effusion.  Heart size is normal.  IMPRESSION: Stable low lung volumes with mild bibasilar atelectasis versus scarring.  No acute findings.   Original Report Authenticated By: Myles Rosenthal, M.D.    Ct Head Wo Contrast  01/27/2012  *RADIOLOGY REPORT*  Clinical Data: Altered mental status, weakness  CT HEAD WITHOUT CONTRAST  Technique:  Contiguous axial images were obtained from the base of the skull through the vertex without contrast.  Comparison: 10/21/2011  Findings: Again noted multiple lytic skull lesions stable from prior exam.  No intracranial hemorrhage, mass effect or midline shift. Paranasal sinuses and mastoid air cells are unremarkable.  No acute infarction.  No mass lesion is noted on this unenhanced scan.  Ventricular size is stable from prior exam.  No intra or extra-axial fluid collection.  IMPRESSION: No acute intracranial abnormality.  No significant change.   Original Report Authenticated By: Natasha Mead, M.D.    Nm Pulmonary Perf And Vent  02/25/2012  *RADIOLOGY REPORT*   Clinical Data: Short of breath  NM PULMONARY VENTILATION AND PERFUSION SCAN  Radiopharmaceutical: 5.7MILLI CURIE MAA TECHNETIUM TO 54M ALBUMIN AGGREGATED  Comparison: 02/25/2012  Findings: Exam detail diminish due to low lung volumes.  The chest radiograph from today the lungs appear hypoinflated.  Within the right lung base there is a moderate sized perfusion defect with corresponding ventilation abnormality.  No unmatched segmental perfusion defects identified.  IMPRESSION:  1. Diminished exam detail due to low lung volumes.  Low probability for acute pulmonary embolus.   Original Report Authenticated By: Signa Kell, M.D.    Chest xraY REVIEWED BY ME.  Tracing form 01/14/08 showed sinus tachy 100/min otherwise no singificant change Patient's oxygen desaturated to 79% on room air at 9:15 PM MDM  Spoke with Dr. Irene Limbo plan observation medical surgical floor  diagnosis #1 hypoxemia #2 renal insufficiency #3hyperglycemia       Doug Sou, MD 02/25/12 2141  Doug Sou, MD 02/26/12 0454

## 2012-02-25 NOTE — ED Notes (Signed)
Pt's spouse stated she needs to leave but wants to be notified of pt's room number when he is assigned.

## 2012-02-25 NOTE — H&P (Addendum)
History and Physical  Lowery Paullin Trupiano NWG:[id number] DOB: May 22, 1945 DOA: 02/25/2012  Referring physician: Doug Sou, MD PCP: Katy Apo, MD  Oncologist: Woodroe Mode, M.D.  Chief Complaint: Shortness breath  HPI:  67 year old man presented emergency department with with progressive shortness of breath. Markedly hypoxic in the emergency department, initial evaluation unrevealing. Referred for admission.  Patient with history of multiple myeloma currently on oral chemotherapy, no pulmonary history who was in his usual state of health until 4 days ago when he developed shortness of breath. At baseline the patient is able to perform activities without limits in regard to breathing and continues to work on cars. 4 days ago he developed shortness of breath with chest rapidly progressed. He was able to work some yesterday but today became so short of breath that he came to the emergency department for further evaluation. He has had some chills at home but no fever, no cough, no pain. No aggravating or alleviating factors described. He becomes short of breath with minimal ambulation. He has worsened each day over the last 4 days. He is worse and this morning his wife reports he has been declining over the last several hours while in the emergency department.  In the emergency department noted to be afebrile, with low normal blood pressure. Tachypneic and hypoxic requiring high flow oxygen. Desaturates quickly off oxygen. CBC revealed neutropenia and stable anemia. Basic metabolic panel consistent with chronic kidney disease stage III. D-dimer elevated. VQ scan low probability for PE. Chest x-ray negative. EKG sinus rhythm, no acute changes.  Review of Systems:  Negative for fever, visual changes, rash, new muscle aches, chest pain,  dysuria, bleeding, n/v/abdominal pain.  Positive for intermittent chills, sore throat.  Past Medical History  Diagnosis Date  . Allergy   . Diabetes mellitus    . Hypertension   . Hepatitis C   . Multiple myeloma(203.0)   . Chronic kidney disease   . Multiple myeloma     Past Surgical History  Procedure Date  . Circumcision   . Hand surgery     Social History:  reports that he quit smoking about 4 years ago. His smoking use included Cigarettes. He has a 22 pack-year smoking history. He has never used smokeless tobacco. He reports that he drinks alcohol. He reports that he does not use illicit drugs.  Allergies  Allergen Reactions  . Ace Inhibitors     He is not sure about allergy to ace inhibitors    Family History  Problem Relation Age of Onset  . Cancer Sister   . Diabetes Other   . Hypertension Other      Prior to Admission medications   Medication Sig Start Date End Date Taking? Authorizing Provider  amLODipine (NORVASC) 5 MG tablet Take 5 mg by mouth daily.     Yes Historical Provider, MD  aspirin 81 MG tablet Take 81 mg by mouth daily.   Yes Historical Provider, MD  atenolol (TENORMIN) 100 MG tablet Take 100 mg by mouth daily.     Yes Historical Provider, MD  dexamethasone (DECADRON) 4 MG tablet Take 10 tablets (40 mg total) by mouth once a week. On fridays 02/09/12  Yes Conni Slipper, PA  docusate sodium (COLACE) 100 MG capsule Take 200-300 mg by mouth 2 (two) times daily. Takes 2 capsules(200mg ) every morning and 3 capsules(300mg ) every night   Yes Historical Provider, MD  filgrastim (NEUPOGEN) 480 MCG/0.8ML SOLN injection Inject 0.8 mLs (480 mcg total) into the skin  as directed. 12/22/11  Yes Si Gaul, MD  insulin glargine (LANTUS) 100 UNIT/ML injection Inject 64 Units into the skin at bedtime.    Yes Historical Provider, MD  NOVOLOG MIX 70/30 (70-30) 100 UNIT/ML injection Inject 2-28 Units into the skin once a week. Per sliding scale.Marland KitchenMarland KitchenOnly takes morning after taking dexamethasone 10/08/11  Yes Historical Provider, MD  oxyCODONE (OXYCONTIN) 40 MG 12 hr tablet Take 1 tablet (40 mg total) by mouth every 12 (twelve)  hours. 02/09/12  Yes Conni Slipper, PA  pomalidomide (POMALYST) 4 MG capsule Take 1 capsule (4 mg total) by mouth daily. 02/10/12  Yes Si Gaul, MD  senna (SENOKOT) 8.6 MG tablet Take 1 tablet by mouth daily.   Yes Historical Provider, MD  simvastatin (ZOCOR) 10 MG tablet Take 10 mg by mouth at bedtime.     Yes Historical Provider, MD  Tamsulosin HCl (FLOMAX) 0.4 MG CAPS Take 1 capsule (0.4 mg total) by mouth daily after supper. 10/27/11  Yes Christina P Rama, MD  valsartan-hydrochlorothiazide (DIOVAN-HCT) 160-12.5 MG per tablet Take 1 tablet by mouth daily.     Yes Historical Provider, MD   Physical Exam: Filed Vitals:   02/25/12 1450 02/25/12 1548 02/25/12 2000 02/25/12 2001  BP: 123/87 97/69 104/69   Pulse: 69 73 71   Temp:  98.6 F (37 C) 97.7 F (36.5 C)   TempSrc:  Rectal Oral   Resp: 20 20 20    SpO2: 96% 93% 91% 92%    General:  Examined in the emergency department. Appears calm but uncomfortable. Restless. Intermittently sits up secondary to shortness of breath and then lays back down. Appears acutely ill. Eyes: Left pupil round, reactive. Right pupil eccentric secondary to old injury by report (football). Normal lids, irises ENT: grossly normal hearing, lips & tongue Neck: no LAD, masses or thyromegaly Cardiovascular: RRR, no m/r/g. No LE edema. Respiratory: CTA bilaterally, no w/r/r. Decreased breath sounds. Tachypneic. Moderate increased work of breathing. Abdomen: soft, ntnd Skin: no rash or induration seen on limited exam Musculoskeletal: grossly normal tone BUE/BLE Psychiatric: grossly normal mood and affect, speech fluent and appropriate Neurologic: grossly non-focal.  Wt Readings from Last 3 Encounters:  01/11/12 88.179 kg (194 lb 6.4 oz)  12/14/11 87.907 kg (193 lb 12.8 oz)  11/17/11 88.587 kg (195 lb 4.8 oz)    Labs on Admission:  Basic Metabolic Panel:  Lab 02/25/12 1610 02/22/12 1143  NA 134* 138  K 5.1 4.4  CL 99 104  CO2 24 25  GLUCOSE 202*  361*  BUN 31* 42.0*  CREATININE 1.74* 1.8*  CALCIUM 9.6 9.3  MG -- --  PHOS -- --   Liver Function Tests:  Lab 02/22/12 1143  AST 8  ALT 9  ALKPHOS 155*  BILITOT 0.46  PROT 6.4  ALBUMIN 3.1*    CBC:  Lab 02/25/12 1646 02/22/12 1143  WBC 1.5* 2.0*  NEUTROABS 0.8* 1.5  HGB 11.7* 11.5*  HCT 36.3* 34.9*  MCV 89.6 93.5  PLT 171 175   Radiological Exams on Admission: Dg Chest 2 View  02/25/2012  *RADIOLOGY REPORT*  Clinical Data: Short of breath  CHEST - 2 VIEW  Comparison: 01/27/2012  Findings: Low lung volumes and bibasilar scarring is again demonstrated.  Normal heart size.  No effusion or edema.  No airspace consolidation.  IMPRESSION:  1.  Chronic low lung volumes with bibasilar atelectasis.   Original Report Authenticated By: Signa Kell, M.D.    Nm Pulmonary Perf And Vent  02/25/2012  *  RADIOLOGY REPORT*  Clinical Data: Short of breath  NM PULMONARY VENTILATION AND PERFUSION SCAN  Radiopharmaceutical: 5.7MILLI CURIE MAA TECHNETIUM TO 64M ALBUMIN AGGREGATED  Comparison: 02/25/2012  Findings: Exam detail diminish due to low lung volumes.  The chest radiograph from today the lungs appear hypoinflated.  Within the right lung base there is a moderate sized perfusion defect with corresponding ventilation abnormality.  No unmatched segmental perfusion defects identified.  IMPRESSION:  1. Diminished exam detail due to low lung volumes.  Low probability for acute pulmonary embolus.   Original Report Authenticated By: Signa Kell, M.D.     EKG: Independently reviewed. As above   Principal Problem:  *Acute respiratory failure with hypoxia Active Problems:  Multiple myeloma in relapse  Acquired immunocompromised state  Diabetes mellitus, type 2  Hepatitis C  Stage III chronic kidney disease  Neutropenia   Assessment/Plan 1. Acute respiratory failure: Etiology unclear, workup thus far unrevealing, symptoms nonspecific. Worsening in the emergency department. Given  immunocompromise state and hypoxia cannot exclude developing pneumonia. Empiric antibiotic therapy, check chest CT to assess lung parenchyma. Although VQ scan was low probability, patient's chemotherapy agent can cause thromboembolism and there is no clear explanation for the patient's profound hypoxia and shortness of breath. Empiric Lovenox. Patient with no history of bleeding. Check 2-D echocardiogram to assess heart function and look for strain. Pulmonary consultation in the morning for further recommendations. Check influenza. 2. Neutropenia: Oncology consultation in the morning. Patient not currently on Neulasta. 3. Multiple myeloma: Currently undergoing chemotherapy. 4. Chronic kidney disease stage III: stable. 5. Diabetes mellitus: Sliding scale insulin. Clarify whether the patient on Lantus or 70/30 insulin. 6. History of hepatitis C 7. Chronic pain: Continue OxyContin.  Message left for Dr. Nehemiah Settle at [phone].  Addendum: CT scan revealed multifocal pneumonia. Lactic acid and procalcitonin unremarkable. Troponin and BNP mildly elevated, of unclear significance. Patient without signs or symptoms to suggest acute coronary syndrome or volume overload. EKG was unremarkable. Likely reflects strain from pneumonia, hypoxia. Serial cardiac enzymes. Patient already has received Lovenox as above, given aspirin by the emergency department physician.  Code Status: Full code Family Communication: Discussed with wife at bedside Disposition Plan/Anticipated LOS: Admit to step down, inpatient. 2-4 days  Time spent: 70 minutes  Brendia Sacks, MD  [facility] Team 4  Pager [phone] If 7PM-7AM, please contact night-coverage at [email or web address], password [facility] 02/25/2012, 9:04 PM

## 2012-02-26 ENCOUNTER — Encounter (HOSPITAL_COMMUNITY): Payer: Self-pay | Admitting: Emergency Medicine

## 2012-02-26 DIAGNOSIS — D709 Neutropenia, unspecified: Secondary | ICD-10-CM

## 2012-02-26 DIAGNOSIS — J9600 Acute respiratory failure, unspecified whether with hypoxia or hypercapnia: Secondary | ICD-10-CM

## 2012-02-26 DIAGNOSIS — R4182 Altered mental status, unspecified: Secondary | ICD-10-CM

## 2012-02-26 DIAGNOSIS — C9000 Multiple myeloma not having achieved remission: Secondary | ICD-10-CM

## 2012-02-26 DIAGNOSIS — I517 Cardiomegaly: Secondary | ICD-10-CM

## 2012-02-26 DIAGNOSIS — J189 Pneumonia, unspecified organism: Principal | ICD-10-CM

## 2012-02-26 LAB — COMPREHENSIVE METABOLIC PANEL
ALT: 9 U/L (ref 0–53)
Alkaline Phosphatase: 118 U/L — ABNORMAL HIGH (ref 39–117)
CO2: 23 mEq/L (ref 19–32)
GFR calc Af Amer: 59 mL/min — ABNORMAL LOW (ref >90)
Glucose, Bld: 176 mg/dL — ABNORMAL HIGH (ref 70–99)
Potassium: 4.6 mEq/L (ref 3.5–5.1)
Sodium: 135 mEq/L (ref 135–145)
Total Protein: 6 g/dL (ref 6.0–8.3)

## 2012-02-26 LAB — GLUCOSE, CAPILLARY
Glucose-Capillary: 266 mg/dL — ABNORMAL HIGH (ref 70–99)
Glucose-Capillary: 238 mg/dL — ABNORMAL HIGH (ref 70–99)

## 2012-02-26 LAB — CBC WITH DIFFERENTIAL/PLATELET
Basophils Absolute: 0 10*3/uL (ref 0.0–0.1)
Eosinophils Absolute: 0.1 10*3/uL (ref 0.0–0.7)
HCT: 35.8 % — ABNORMAL LOW (ref 39.0–52.0)
Lymphocytes Relative: 31 % (ref 12–46)
Lymphs Abs: 0.4 10*3/uL — ABNORMAL LOW (ref 0.7–4.0)
MCHC: 32.7 g/dL (ref 30.0–36.0)
MCV: 91.1 fL (ref 78.0–100.0)
Monocytes Absolute: 0.3 10*3/uL (ref 0.1–1.0)
Neutro Abs: 0.5 10*3/uL — ABNORMAL LOW (ref 1.7–7.7)
RDW: 15.8 % — ABNORMAL HIGH (ref 11.5–15.5)

## 2012-02-26 LAB — INFLUENZA PANEL BY PCR (TYPE A & B)
H1N1 flu by pcr: NOT DETECTED
Influenza A By PCR: NEGATIVE
Influenza B By PCR: NEGATIVE

## 2012-02-26 LAB — PROCALCITONIN: Procalcitonin: <0.1 ng/mL

## 2012-02-26 LAB — TROPONIN I: Troponin I: 0.43 ng/mL (ref <0.30)

## 2012-02-26 MED ORDER — DEXTROSE 5 % IV SOLN
1.0000 g | Freq: Two times a day (BID) | INTRAVENOUS | Status: DC
  Administered 2012-02-26 – 2012-02-29 (×6): 1 g via INTRAVENOUS
  Filled 2012-02-26 (×7): qty 1

## 2012-02-26 MED ORDER — ALBUTEROL SULFATE (5 MG/ML) 0.5% IN NEBU
2.5000 mg | INHALATION_SOLUTION | Freq: Four times a day (QID) | RESPIRATORY_TRACT | Status: DC
  Administered 2012-02-26 – 2012-03-03 (×23): 2.5 mg via RESPIRATORY_TRACT
  Filled 2012-02-26 (×22): qty 0.5

## 2012-02-26 MED ORDER — LEVOFLOXACIN IN D5W 750 MG/150ML IV SOLN
750.0000 mg | INTRAVENOUS | Status: DC
  Administered 2012-02-27 – 2012-03-01 (×4): 750 mg via INTRAVENOUS
  Filled 2012-02-26 (×5): qty 150

## 2012-02-26 MED ORDER — TAMSULOSIN HCL 0.4 MG PO CAPS
0.4000 mg | ORAL_CAPSULE | Freq: Every day | ORAL | Status: DC
  Administered 2012-02-26 – 2012-03-02 (×6): 0.4 mg via ORAL
  Filled 2012-02-26 (×7): qty 1

## 2012-02-26 MED ORDER — SODIUM CHLORIDE 0.9 % IV SOLN
INTRAVENOUS | Status: DC
  Administered 2012-02-26 – 2012-02-27 (×2): 50 mL/h via INTRAVENOUS
  Administered 2012-02-29: via INTRAVENOUS

## 2012-02-26 MED ORDER — OXYCODONE HCL ER 20 MG PO T12A
40.0000 mg | EXTENDED_RELEASE_TABLET | Freq: Two times a day (BID) | ORAL | Status: DC
  Administered 2012-02-26 – 2012-03-03 (×11): 40 mg via ORAL
  Filled 2012-02-26 (×10): qty 2
  Filled 2012-02-26: qty 1
  Filled 2012-02-26 (×2): qty 2

## 2012-02-26 MED ORDER — INSULIN GLARGINE 100 UNIT/ML ~~LOC~~ SOLN
30.0000 [IU] | Freq: Every day | SUBCUTANEOUS | Status: DC
  Administered 2012-02-26 – 2012-03-03 (×7): 30 [IU] via SUBCUTANEOUS

## 2012-02-26 MED ORDER — ASPIRIN 325 MG PO TABS
325.0000 mg | ORAL_TABLET | ORAL | Status: AC
  Administered 2012-02-26: 325 mg via ORAL
  Filled 2012-02-26: qty 1

## 2012-02-26 MED ORDER — INSULIN ASPART 100 UNIT/ML ~~LOC~~ SOLN
0.0000 [IU] | Freq: Three times a day (TID) | SUBCUTANEOUS | Status: DC
  Administered 2012-02-26: 1 [IU] via SUBCUTANEOUS
  Administered 2012-02-26: 5 [IU] via SUBCUTANEOUS
  Administered 2012-02-26: 2 [IU] via SUBCUTANEOUS
  Administered 2012-02-27: 3 [IU] via SUBCUTANEOUS
  Administered 2012-02-27: 2 [IU] via SUBCUTANEOUS
  Administered 2012-02-27: 5 [IU] via SUBCUTANEOUS
  Administered 2012-02-28: 3 [IU] via SUBCUTANEOUS

## 2012-02-26 MED ORDER — SENNA 8.6 MG PO TABS
1.0000 | ORAL_TABLET | Freq: Every day | ORAL | Status: DC
  Administered 2012-02-26 – 2012-02-27 (×2): 8.6 mg via ORAL
  Filled 2012-02-26 (×3): qty 1

## 2012-02-26 MED ORDER — IPRATROPIUM BROMIDE 0.02 % IN SOLN
0.5000 mg | Freq: Four times a day (QID) | RESPIRATORY_TRACT | Status: DC
  Administered 2012-02-26 – 2012-03-03 (×23): 0.5 mg via RESPIRATORY_TRACT
  Filled 2012-02-26 (×22): qty 2.5

## 2012-02-26 MED ORDER — FILGRASTIM 480 MCG/1.6ML IJ SOLN
480.0000 ug | Freq: Every day | INTRAMUSCULAR | Status: AC
  Administered 2012-02-26 – 2012-02-28 (×3): 480 ug via SUBCUTANEOUS
  Filled 2012-02-26 (×3): qty 1.6

## 2012-02-26 MED ORDER — BIOTENE DRY MOUTH MT LIQD
15.0000 mL | Freq: Two times a day (BID) | OROMUCOSAL | Status: DC
  Administered 2012-02-26 – 2012-03-03 (×12): 15 mL via OROMUCOSAL

## 2012-02-26 MED ORDER — ENOXAPARIN SODIUM 40 MG/0.4ML ~~LOC~~ SOLN
40.0000 mg | SUBCUTANEOUS | Status: DC
  Administered 2012-02-27 – 2012-03-03 (×6): 40 mg via SUBCUTANEOUS
  Filled 2012-02-26 (×6): qty 0.4

## 2012-02-26 NOTE — Progress Notes (Signed)
DIAGNOSIS:  Multiple myeloma IgA subtype diagnosed in November, 2009.  PRIOR THERAPY:  1. Status post seven cycles of systemic chemotherapy with Revlimid and low-dose Betatron with partial response. The last dose was given June, 2010. 2. Status post chemotherapy with weekly Velcade in a patient on Decadron 40 mg on a weekly basis, status post 75 weekly doses. The last dose was given May 04, 2008. 3. Status post treatment again with Velcade and Decadron. The patient was awaiting stem cell transplant at [facility]. Last dose was given Jul 16, 2009. 4. The patient was not considered a good candidate for autologous stem cell transplant at that time secondary to poor mobilization of his stem cells.  5. Status post six cycles of systemic chemotherapy with Velcade, Cytoxan and Betatron. Last dose was given March 25, 2010. This continued secondary to his disease progression. 6. Status post ten cycles of systemic chemotherapy with Velcade. Doxil and Decadron. Last dose was given on 10/31/2010. Currently on hold because of concern about cardiac toxicity. CURRENT THERAPY:  1. Pomalyst 4 mg by mouth daily for 21 days every 28 days, completed cycle #8. 2. Decadron 40 mg by mouth weekly  Subjective: The patient is seen and examined today. His wife is at the bedside. He was admitted yesterday with worsening dyspnea over the last 4 days. Yesterday his dyspnea was significant and he was unable to walk for short distance. He was admitted to step down [facility] at [facility]. CT of the chest showed multifocal pneumonia. VQ scan showed low probability for pulmonary embolus. The patient was started on treatment with cefepime, Levaquin and vancomycin. He is feeling a little bit better today. He denied having any significant fever or chills. He has no significant cough or hemoptysis. His white blood counts stay low.   Objective: Vital signs in last 24 hours: Temp:  [97.4 F (36.3 C)-104 F  (40 C)] 98.1 F (36.7 C) (01/03 0800) Pulse Rate:  [60-76] 60  (01/03 0757) Resp:  [17-33] 20  (01/03 0757) BP: (97-144)/(62-93) 121/62 mmHg (01/03 0757) SpO2:  [68 %-100 %] 94 % (01/03 1004) Weight:  [194 lb 7.1 oz (88.2 kg)] 194 lb 7.1 oz (88.2 kg) (01/02 2306)  Intake/Output from previous day: 01/02 0701 - 01/03 0700 In: 200 [I.V.:200] Out: 700 [Urine:700] Intake/Output this shift: Total I/O In: 150 [I.V.:150] Out: -   General appearance: alert, cooperative and no distress Resp: wheezes bilaterally Cardio: regular rate and rhythm, S1, S2 normal, no murmur, click, rub or gallop GI: soft, non-tender; bowel sounds normal; no masses,  no organomegaly Extremities: extremities normal, atraumatic, no cyanosis or edema  Lab Results:   [facility] 02/26/12 0520 02/25/12 1646  WBC 1.3* 1.5*  HGB 11.7* 11.7*  HCT 35.8* 36.3*  PLT 148* 171   BMET  Basename 02/26/12 0520 02/25/12 1646  NA 135 134*  K 4.6 5.1  CL 102 99  CO2 23 24  GLUCOSE 176* 202*  BUN 33* 31*  CREATININE 1.42* 1.74*  CALCIUM 9.5 9.6    Studies/Results: Dg Chest 2 View  02/25/2012  *RADIOLOGY REPORT*  Clinical Data: Short of breath  CHEST - 2 VIEW  Comparison: 01/27/2012  Findings: Low lung volumes and bibasilar scarring is again demonstrated.  Normal heart size.  No effusion or edema.  No airspace consolidation.  IMPRESSION:  1.  Chronic low lung volumes with bibasilar atelectasis.   Original Report Authenticated By: Signa Kell, M.D.    Ct Chest Wo Contrast  02/25/2012  *  RADIOLOGY REPORT*  Clinical Data: Hypoxia; shortness of breath.  Acute respiratory failure.  CT CHEST WITHOUT CONTRAST  Technique:  Multidetector CT imaging of the chest was performed following the standard protocol without IV contrast.  Comparison: CT of the chest performed 10/24/2011  Findings: Patchy bilateral airspace opacification is noted, favoring the upper lung lobes, though also seen within both lower lobes.  Focal density at the  right lung base is thought to reflect scarring.  Previously noted nodularity at the left upper lobe has resolved, and was likely infectious in nature.  No pleural effusion or pneumothorax is seen.  Diffuse coronary artery calcifications are seen.  The mediastinum is otherwise unremarkable in appearance.  Scattered mediastinal nodes remain normal in size.  No pericardial effusion is identified.  Mild calcific atherosclerotic disease is noted along the proximal great vessels; the great vessels are otherwise grossly unremarkable.  The thyroid gland is unremarkable in appearance.  No axillary lymphadenopathy is seen.  The visualized portions of the liver and spleen are unremarkable in appearance.  Mildly increased attenuation layering within the gallbladder may reflect sludge or tiny stones.  Nonspecific perinephric stranding is again noted bilaterally.  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  No acute osseous abnormalities are identified.  As noted on the prior study, there are compression deformities involving T5-L1, and changes of vertebroplasty are noted at L1.  These appear grossly stable from the prior study.  IMPRESSION:  1.  Patchy bilateral airspace opacification, favoring the upper lung lobes, compatible with multifocal pneumonia. 2.  Focal density at the right lung base is thought to reflect scarring; the previously noted nodule at the left upper lobe has resolved and was likely infectious in nature. 3.  Diffuse coronary artery calcifications seen. 4.  Mildly increased attenuation layering within the gallbladder may reflect sludge or tiny stones. 5.  Extensive chronic compression deformities along the thoracic and upper lumbar spine, unchanged from the prior CT.   Original Report Authenticated By: Tonia Ghent, M.D.    Nm Pulmonary Perf And Vent  02/25/2012  *RADIOLOGY REPORT*  Clinical Data: Short of breath  NM PULMONARY VENTILATION AND  PERFUSION SCAN  Radiopharmaceutical: 5.7MILLI CURIE MAA TECHNETIUM TO 16M ALBUMIN AGGREGATED  Comparison: 02/25/2012  Findings: Exam detail diminish due to low lung volumes.  The chest radiograph from today the lungs appear hypoinflated.  Within the right lung base there is a moderate sized perfusion defect with corresponding ventilation abnormality.  No unmatched segmental perfusion defects identified.  IMPRESSION:  1. Diminished exam detail due to low lung volumes.  Low probability for acute pulmonary embolus.   Original Report Authenticated By: Signa Kell, M.D.     Medications: I have reviewed the patient's current medications.  Assessment/Plan: This is a very pleasant 67 years old [place] American male with history of multiple myeloma diagnosed in November of 2009 status post several systemic chemotherapy regimens and currently on treatment with Pomalyst and Decadron. The patient was admitted with pneumonia. He is currently covered with several antibiotics including cefepime, Levaquin and vancomycin. He is feeling a little bit better today. I recommended for him to hold treatment with Pomalyst for now until after discharge from the hospital . For the neutropenia, I will order Neupogen 480 mcg subcutaneously for the next 3 days. Thank you so much for taking good care of Mr. Kintzel, I will continue to follow the patient with you and assist in his management an as-needed basis.  LOS: 1 day    Markea Ruzich K. 02/26/2012

## 2012-02-26 NOTE — Progress Notes (Signed)
ANTIBIOTIC CONSULT NOTE - FOLLOW UP  Pharmacy Consult for Vanc, Levaquin, Cefepime Indication: pneumonia, sepsis  Allergies  Allergen Reactions  . Ace Inhibitors     He is not sure about allergy to ace inhibitors   Patient Measurements: Height: 5' 6.93" (170 cm) Weight: 194 lb 7.1 oz (88.2 kg) IBW/kg (Calculated) : 65.94   Labs:  Basename 02/26/12 0520 02/25/12 1646  WBC 1.3* 1.5*  HGB 11.7* 11.7*  PLT 148* 171  LABCREA -- --  CREATININE 1.42* 1.74*   Estimated Creatinine Clearance: 55.6 ml/min (by C-G formula based on Cr of 1.42). No results found for this basename: VANCOTROUGH:2,VANCOPEAK:2,VANCORANDOM:2,GENTTROUGH:2,GENTPEAK:2,GENTRANDOM:2,TOBRATROUGH:2,TOBRAPEAK:2,TOBRARND:2,AMIKACINPEAK:2,AMIKACINTROU:2,AMIKACIN:2, in the last 72 hours   Assessment:  37 yom with h/o MM currently receiving oral chemo presented 01/02 with c/o progressing SOB. CBC revealved neutropenia (this was noted outpatient 12/17 and pt received Neupogen sq x 3d). Patient with h/o CKD stage III. CT scan revealed multifocal pneumonia.  Today is day #1 of therapy.  Patient's Tmax 104, WBC low 1.3 with ANC of 0.5K.  Renal function improved (Scr now 1.42 for normalized and CG creatinine of ~54 ml/min).   Cultures ordered and are pending  Goal of Therapy:  Vancomycin trough level 15-20 mcg/ml Appropriate renal adjustment of antibiotics  Plan:   Continue Vancomycin 750 mg IV q12h  Change Cefepime to 1gm IV q12h   Change Levaquin to 750 mg IV q24h  Pharmacy will f/u  Geoffry Paradise, PharmD, BCPS Pager: [phone] 9:09 AM Pharmacy #: [id number]

## 2012-02-26 NOTE — ED Notes (Signed)
EDP attempted to take pt off of O2, Sats immediately dropped to 79%.  Oxygen applied and sats slowly increased to 94% on 4L [place]

## 2012-02-26 NOTE — Consult Note (Signed)
PULMONARY  / CRITICAL CARE MEDICINE  Name: Rodney Green MRN: [id number] DOB: [birth year]    LOS: 1  REFERRING MD :  Dr. Nehemiah Settle   CHIEF COMPLAINT:  Referral for PNA/Hypoxia   BRIEF PATIENT DESCRIPTION: 67 yo AAM former smoker with known hx of multiple myeloma on oral chemo w/ neutropenia admitted 1/2  w/ significant Hypoxia and multifocal patchy infiltrates on CT scan, suspected PNA CCM consulted on 1/3   LINES / TUBES: PIV   CULTURES: BC x 2 1/2 >> 1/2 Influ > 1/2 Sputum >  ANTIBIOTICS: Vanc 1/2>> Levaquin 1/2>> Maxipime 1/2 >>  SIGNIFICANT EVENTS:  1/2 admitted with severe hypoxia and multifocal PNA   LEVEL OF CARE:  ICU  PRIMARY SERVICE:  IM /[place] CONSULTANTS:   CODE STATUS: Full  DIET: clear liq DVT Px:  Lovenox  GI Px:  Not indicated   HISTORY OF PRESENT ILLNESS:   67 year old man presented emergency department 1/2 with with progressive shortness of breath x 4 days . Markedly hypoxic in the emergency department with sats 65%  .  Patient with history of multiple myeloma currently on oral chemotherapy, no pulmonary history who was in his usual state of health until 4 days ago when he developed shortness of breath. At baseline the patient is able to perform activities without limits in regard to breathing and continues to work on cars. 4 days ago he developed shortness of breath with chest pain rapidly progressed. He was able to work some up until 1/2 due to severe dyspnea with min. Act.  He had some chills at home but no fever, no cough, no pain. No aggravating or alleviating factors described. He becomes short of breath with minimal ambulation. He has worsened each day over the last 4 days. He is worse and this morning his wife reports he has been declining over the last several hours while in the emergency department.  In the emergency department noted to be afebrile, with low normal blood pressure. Tachypneic and hypoxic requiring high flow oxygen. Desaturates  quickly off oxygen. CBC revealed neutropenia and stable anemia. Basic metabolic panel consistent with chronic kidney disease stage III. D-dimer elevated. VQ scan low probability for PE. Chest x-ray negative. EKG sinus rhythm, no acute changes. CT chest showed a Patchy bilateral airspace opacification, favoring the upper  lung lobes, compatible with multifocal pneumonia. Lactate and PCT low  He was started on broad spectrum abx w/ Van/leva/maxipime.  Required NRB O2 until ~8 am 1/3 , transitioned to [place] at 4L  CCM asked to consult for PNA and Hypoxia 02/26/2012  Hx of smoking x 26yr , quit  X 4 years ago .    PAST MEDICAL HISTORY :  Past Medical History  Diagnosis Date  . Allergy   . Diabetes mellitus   . Hypertension   . Hepatitis C   . Multiple myeloma(203.0)   . Multiple myeloma   . Chronic kidney disease    Past Surgical History  Procedure Date  . Circumcision   . Hand surgery    Prior to Admission medications   Medication Sig Start Date End Date Taking? Authorizing Provider  amLODipine (NORVASC) 5 MG tablet Take 5 mg by mouth daily.     Yes Historical Provider, MD  aspirin 81 MG tablet Take 81 mg by mouth daily.   Yes Historical Provider, MD  atenolol (TENORMIN) 100 MG tablet Take 100 mg by mouth daily.     Yes Historical Provider, MD  dexamethasone (DECADRON) 4 MG  tablet Take 10 tablets (40 mg total) by mouth once a week. On fridays 02/09/12  Yes Conni Slipper, PA  docusate sodium (COLACE) 100 MG capsule Take 200-300 mg by mouth 2 (two) times daily. Takes 2 capsules(200mg ) every morning and 3 capsules(300mg ) every night   Yes Historical Provider, MD  filgrastim (NEUPOGEN) 480 MCG/0.8ML SOLN injection Inject 0.8 mLs (480 mcg total) into the skin as directed. 12/22/11  Yes Si Gaul, MD  insulin glargine (LANTUS) 100 UNIT/ML injection Inject 64 Units into the skin at bedtime.    Yes Historical Provider, MD  NOVOLOG MIX 70/30 (70-30) 100 UNIT/ML injection Inject 2-28 Units  into the skin once a week. Per sliding scale.Marland KitchenMarland KitchenOnly takes morning after taking dexamethasone 10/08/11  Yes Historical Provider, MD  oxyCODONE (OXYCONTIN) 40 MG 12 hr tablet Take 1 tablet (40 mg total) by mouth every 12 (twelve) hours. 02/09/12  Yes Conni Slipper, PA  pomalidomide (POMALYST) 4 MG capsule Take 1 capsule (4 mg total) by mouth daily. 02/10/12  Yes Si Gaul, MD  senna (SENOKOT) 8.6 MG tablet Take 1 tablet by mouth daily.   Yes Historical Provider, MD  simvastatin (ZOCOR) 10 MG tablet Take 10 mg by mouth at bedtime.     Yes Historical Provider, MD  Tamsulosin HCl (FLOMAX) 0.4 MG CAPS Take 1 capsule (0.4 mg total) by mouth daily after supper. 10/27/11  Yes Christina P Rama, MD  valsartan-hydrochlorothiazide (DIOVAN-HCT) 160-12.5 MG per tablet Take 1 tablet by mouth daily.     Yes Historical Provider, MD   Allergies  Allergen Reactions  . Ace Inhibitors     He is not sure about allergy to ace inhibitors    FAMILY HISTORY:  Family History  Problem Relation Age of Onset  . Cancer Sister   . Diabetes Other   . Hypertension Other    SOCIAL HISTORY:  reports that he quit smoking about 4 years ago. His smoking use included Cigarettes. He has a 22 pack-year smoking history. He has never used smokeless tobacco. He reports that he does not drink alcohol or use illicit drugs.  REVIEW OF SYSTEMS:  Constitutional:   No  weight loss, night sweats,  Fevers,  +chills, fatigue, or  lassitude.  HEENT:   No headaches,  Difficulty swallowing,  Tooth/dental problems, or  Sore throat,                No sneezing, itching, ear ache, nasal congestion, post nasal drip,   CV:  No    Orthopnea, PND, swelling in lower extremities, anasarca, dizziness, palpitations, syncope.   GI  No heartburn, indigestion, abdominal pain, nausea, vomiting, diarrhea, change in bowel habits, loss of appetite, bloody stools.   Resp:    No excess mucus, no productive cough,  No non-productive cough,  No coughing  up of blood.  No change in color of mucus.  No wheezing.  No chest wall deformity  Skin: no rash or lesions.  GU: no dysuria, change in color of urine, no urgency or frequency.  No flank pain, no hematuria   MS:  No joint   swelling.  No decreased range of motion.    Psych:  No change in mood or affect. No depression or anxiety.  No memory loss.   INTERVAL HISTORY:  Decreased SOB , no cough since admission and stabilization Pain controlled on rx   VITAL SIGNS: Temp:  [97.4 F (36.3 C)-104 F (40 C)] 98.1 F (36.7 C) (01/03 0800) Pulse Rate:  [60-76]  60  (01/03 0757) Resp:  [17-33] 20  (01/03 0757) BP: (97-144)/(62-93) 121/62 mmHg (01/03 0757) SpO2:  [68 %-100 %] 100 % (01/03 0757) Weight:  [88.2 kg (194 lb 7.1 oz)] 88.2 kg (194 lb 7.1 oz) (01/02 2306) HEMODYNAMICS:       INTAKE / OUTPUT: Intake/Output      01/02 0701 - 01/03 0700 01/03 0701 - 01/04 0700   I.V. (mL/kg) 200 (2.3) 50 (0.6)   Total Intake(mL/kg) 200 (2.3) 50 (0.6)   Urine (mL/kg/hr) 700 (0.3)    Total Output 700    Net -500 +50          PHYSICAL EXAMINATION: GEN: A/Ox3; pleasant , NAD  HEENT:  [place]/AT,   NOSE-clear, THROAT-clear, no lesions, no postnasal drip or exudate noted.   NECK:  Supple w/ fair ROM; no JVD; normal carotid impulses w/o bruits; no thyromegaly or nodules palpated; no lymphadenopathy.  RESP  Diminshed BS in bases no accessory muscle use, no dullness to percussion  CARD:  RRR, no m/r/g  , no peripheral edema, pulses intact, no cyanosis or clubbing.  GI:   Soft & nt; nml bowel sounds; no organomegaly or masses detected.  Musco: Warm bil, no deformities or joint swelling noted.   Neuro: alert, no focal deficits noted.    Skin: Warm, no lesions or rashes   LABS: Cbc  Lab 02/26/12 0520 02/25/12 1646 02/22/12 1143  WBC 1.3* -- --  HGB 11.7* 11.7* 11.5*  HCT 35.8* 36.3* 34.9*  PLT 148* 171 175    Chemistry   Lab 02/26/12 0520 02/25/12 1646 02/22/12 1143  NA 135 134* 138    K 4.6 5.1 4.4  CL 102 99 104  CO2 23 24 25   BUN 33* 31* 42.0*  CREATININE 1.42* 1.74* 1.8*  CALCIUM 9.5 9.6 9.3  MG -- -- --  PHOS -- -- --  GLUCOSE 176* 202* 361*    Liver fxn  Lab 02/26/12 0520 02/22/12 1143  AST 17 8  ALT 9 9  ALKPHOS 118* 155*  BILITOT 0.5 0.46  PROT 6.0 6.4  ALBUMIN 2.7* 3.1*   coags No results found for this basename: APTT:3,INR:3 in the last 168 hours Sepsis markers  Lab 02/26/12 0520 02/25/12 2322 02/25/12 2320  LATICACIDVEN -- -- 1.1  PROCALCITON <0.10 <0.10 --   Cardiac markers  Lab 02/26/12 0520 02/25/12 2320  CKTOTAL -- --  CKMB -- --  TROPONINI 0.43* 0.41*   BNP  Lab 02/25/12 2320  PROBNP 1121.0*   ABG  Lab 02/25/12 2302  PHART 7.442  PCO2ART 34.6*  PO2ART 265.0*  HCO3 23.2  TCO2 20.9    CBG trend  Lab 02/26/12 0730  GLUCAP 147*    IMAGING: CT chest 1/2>Patchy bilateral airspace opacification, favoring the upper  lung lobes, compatible with multifocal pneumonia.  CXR 1/2 >bibasilar atx  VQ scan 1/2 > low prob PE   ECG: NSR   DIAGNOSES: Principal Problem:  *Acute respiratory failure with hypoxia Active Problems:  Multiple myeloma in relapse  Acquired immunocompromised state  Diabetes mellitus, type 2  Hepatitis C  Stage III chronic kidney disease  Neutropenia   ASSESSMENT / PLAN:  PULMONARY  ASSESSMENT: Acute Hypoxic Respiratory Failure in patient with underlying Multiple Myeloma /Neutropenia  >CT chest with patchy bilateral aspdz >Upper lobes (of note ? Recurrent PNA -CT chest 09/2011 >R>L opacities). May reflect viral pneumonitis, bacterial PNA, opportunistic PNA given his neutropenia >Elevated d. Dimer -VQ scan low prob for PE   PLAN:  Titrate O2 for sats >90%, continue to use BiPAP prn Continue Broad spectrum ABX for now. He has improved on current regimen. Would consider FOB for cx if he were to decline or if clinical picture suggests an opportunistic process. Add empiric Bactrim if he  decompensates Follow pan cx  Add BD and IS  Consider changing Lovonex to prophylactic dose   CARDIOVASCULAR  ASSESSMENT:  Hx of HTN Mildly Elevated Troponin -suspect strain due to PNA/hypoxia   PLAN:  Echo pending  B/P rx on hold for now   RENAL  ASSESSMENT:   Hx of chronic Renal insufficiency -baseline 1.8-1.9  >scr tr down 1.4, UOP adequate   PLAN:   Cont to monitor   GASTROINTESTINAL  ASSESSMENT:  No known active issues  PLAN:   Cont to monitor   HEMATOLOGIC  ASSESSMENT:   Hx of Neutropenia w/ Multiple Myeloma , on neupogen and oral chemo  Hx of chronic Anemia >hbg stable   PLAN:  Cont to monitor  Droplet isolation  Cont on broad spectrum abx    INFECTIOUS  ASSESSMENT:   Hx of Neutropenia/Multiple Myeloma >wbc tr down 1.5>1.3 Multifocal PNA in immunocompromised pt on oral chemo   PLAN:   Follow Pan cx , unable to give sputum cx  Cont on broad spectrum abx -  Will perform FOB for BAL if he decompensates to eval for opportunistic process Would add empiric bactrim if he clinically worsens   ENDOCRINE  ASSESSMENT:  Hx of DM  PLAN:   SSI per Primary   NEUROLOGIC  ASSESSMENT: Chronic pain syndrome  PLAN:   Cont pain rx   CLINICAL SUMMARY:   I have personally obtained a history, examined the patient, evaluated laboratory and imaging results, formulated the assessment and plan and placed orders. CRITICAL CARE: The patient is critically ill with multiple organ systems failure and requires high complexity decision making for assessment and support, frequent evaluation and titration of therapies, application of advanced monitoring technologies and extensive interpretation of multiple databases. Critical Care Time devoted to patient care services described in this note is 60 minutes.   [facility] NP -C  Pulmonary and Critical Care Medicine [facility] Pager: [phone]  02/26/2012, 9:04 AM  Levy Pupa, MD, PhD 02/26/2012, 11:14  AM [place] [facility] [phone] or if no answer [phone]

## 2012-02-26 NOTE — Progress Notes (Signed)
CARE MANAGEMENT NOTE 02/26/2012  Patient:  Rodney Green, Rodney Green   Account Number:  [id number]  Date Initiated:  02/26/2012  Documentation initiated by:  DAVIS,RHONDA  Subjective/Objective Assessment:   pt with hypoxia upon admittsion requiring pnrb mask and 02 at 92%     Action/Plan:   from home   Anticipated DC Date:  02/29/2012   Anticipated DC Plan:  HOME/SELF CARE  In-house referral  NA      DC Planning Services  NA      [facility] Choice  NA   Choice offered to / List presented to:  NA   DME arranged  NA      DME agency  NA     HH arranged  NA      HH agency  NA   Status of service:  In process, will continue to follow Medicare Important Message given?  NA - LOS <3 / Initial given by admissions (If response is "NO", the following Medicare IM given date fields will be blank) Date Medicare IM given:   Date Additional Medicare IM given:    Discharge Disposition:    Per UR Regulation:  Reviewed for med. necessity/level of care/duration of stay  If discussed at Long Length of Stay Meetings, dates discussed:    Comments:  [id number]/Rhonda Earlene Plater, RN, BSN, CCM: CHART REVIEWED AND UPDATED.  Next chart review due on [id number]. NO DISCHARGE NEEDS PRESENT AT THIS TIME. CASE MANAGEMENT [phone]

## 2012-02-26 NOTE — Progress Notes (Signed)
Assessment/Plan: Principal Problem:  *Acute respiratory failure with hypoxia - better so far. Appreciate pulm and oncol assistance. Continue abx.  Active Problems:  Pneumonia - see above. Organism not known at this time  Multiple myeloma in relapse  Acquired immunocompromised state  Diabetes mellitus, type 2 - I have started him on Lantus 30 U SQ daily. Is usually on   Hepatitis C  Stage III chronic kidney disease  Neutropenia  Elevated troponins - I do not think that this is acute MI.    Subjective: Feels better. Less dyspnea. No pain. As confirmed by others, he had no cough, fever, or pain prior to onset of dyspnea  Objective:  Vital Signs: Filed Vitals:   02/26/12 0408 02/26/12 0757 02/26/12 0800 02/26/12 1004  BP: 144/77 121/62    Pulse: 63 60    Temp:   98.1 F (36.7 C)   TempSrc:   Oral   Resp: 17 20    Height:      Weight:      SpO2: 100% 100%  94%     EXAM: LUNGS: clear laterally.   Echo in progress as I see patient    Intake/Output Summary (Last 24 hours) at 02/26/12 1342 Last data filed at 02/26/12 1000  Gross per 24 hour  Intake    350 ml  Output    700 ml  Net   -350 ml    Lab Results:  [facility] 02/26/12 0520 02/25/12 1646  NA 135 134*  K 4.6 5.1  CL 102 99  CO2 23 24  GLUCOSE 176* 202*  BUN 33* 31*  CREATININE 1.42* 1.74*  CALCIUM 9.5 9.6  MG -- --  PHOS -- --    Basename 02/26/12 0520  AST 17  ALT 9  ALKPHOS 118*  BILITOT 0.5  PROT 6.0  ALBUMIN 2.7*   No results found for this basename: LIPASE:2,AMYLASE:2 in the last 72 hours  Basename 02/26/12 0520 02/25/12 1646  WBC 1.3* 1.5*  NEUTROABS 0.5* 0.8*  HGB 11.7* 11.7*  HCT 35.8* 36.3*  MCV 91.1 89.6  PLT 148* 171    Basename 02/26/12 0520 02/25/12 2320  CKTOTAL -- --  CKMB -- --  CKMBINDEX -- --  TROPONINI 0.43* 0.41*   No components found with this basename: POCBNP:3  Basename 02/25/12 1646  DDIMER 7.12*   No results found for this basename: HGBA1C:2 in the last 72  hours No results found for this basename: CHOL:2,HDL:2,LDLCALC:2,TRIG:2,CHOLHDL:2,LDLDIRECT:2 in the last 72 hours No results found for this basename: TSH,T4TOTAL,FREET3,T3FREE,THYROIDAB in the last 72 hours No results found for this basename: VITAMINB12:2,FOLATE:2,FERRITIN:2,TIBC:2,IRON:2,RETICCTPCT:2 in the last 72 hours  Studies/Results: Dg Chest 2 View  02/25/2012  *RADIOLOGY REPORT*  Clinical Data: Short of breath  CHEST - 2 VIEW  Comparison: 01/27/2012  Findings: Low lung volumes and bibasilar scarring is again demonstrated.  Normal heart size.  No effusion or edema.  No airspace consolidation.  IMPRESSION:  1.  Chronic low lung volumes with bibasilar atelectasis.   Original Report Authenticated By: Signa Kell, M.D.    Ct Chest Wo Contrast  02/25/2012  *RADIOLOGY REPORT*  Clinical Data: Hypoxia; shortness of breath.  Acute respiratory failure.  CT CHEST WITHOUT CONTRAST  Technique:  Multidetector CT imaging of the chest was performed following the standard protocol without IV contrast.  Comparison: CT of the chest performed 10/24/2011  Findings: Patchy bilateral airspace opacification is noted, favoring the upper lung lobes, though also seen within both lower lobes.  Focal density at the right  lung base is thought to reflect scarring.  Previously noted nodularity at the left upper lobe has resolved, and was likely infectious in nature.  No pleural effusion or pneumothorax is seen.  Diffuse coronary artery calcifications are seen.  The mediastinum is otherwise unremarkable in appearance.  Scattered mediastinal nodes remain normal in size.  No pericardial effusion is identified.  Mild calcific atherosclerotic disease is noted along the proximal great vessels; the great vessels are otherwise grossly unremarkable.  The thyroid gland is unremarkable in appearance.  No axillary lymphadenopathy is seen.  The visualized portions of the liver and spleen are unremarkable in appearance.  Mildly increased  attenuation layering within the gallbladder may reflect sludge or tiny stones.  Nonspecific perinephric stranding is again noted bilaterally.  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  No acute osseous abnormalities are identified.  As noted on the prior study, there are compression deformities involving T5-L1, and changes of vertebroplasty are noted at L1.  These appear grossly stable from the prior study.  IMPRESSION:  1.  Patchy bilateral airspace opacification, favoring the upper lung lobes, compatible with multifocal pneumonia. 2.  Focal density at the right lung base is thought to reflect scarring; the previously noted nodule at the left upper lobe has resolved and was likely infectious in nature. 3.  Diffuse coronary artery calcifications seen. 4.  Mildly increased attenuation layering within the gallbladder may reflect sludge or tiny stones. 5.  Extensive chronic compression deformities along the thoracic and upper lumbar spine, unchanged from the prior CT.   Original Report Authenticated By: Tonia Ghent, M.D.    Nm Pulmonary Perf And Vent  02/25/2012  *RADIOLOGY REPORT*  Clinical Data: Short of breath  NM PULMONARY VENTILATION AND PERFUSION SCAN  Radiopharmaceutical: 5.7MILLI CURIE MAA TECHNETIUM TO 33M ALBUMIN AGGREGATED  Comparison: 02/25/2012  Findings: Exam detail diminish due to low lung volumes.  The chest radiograph from today the lungs appear hypoinflated.  Within the right lung base there is a moderate sized perfusion defect with corresponding ventilation abnormality.  No unmatched segmental perfusion defects identified.  IMPRESSION:  1. Diminished exam detail due to low lung volumes.  Low probability for acute pulmonary embolus.   Original Report Authenticated By: Signa Kell, M.D.    Medications: Medications administered in the last 24 hours reviewed.  Current Medication List reviewed.    LOS: 1 day   [facility] Internal Medicine @ [facility] [phone]) 02/26/2012, 1:42 PM

## 2012-02-26 NOTE — Progress Notes (Signed)
Nutrition Brief Note  Patient identified on the Malnutrition Screening Tool (MST) Report  Body mass index is 30.52 kg/(m^2). Patient meets criteria for overweight based on current BMI.   Current diet order is CHO Mod Med, patient is consuming approximately 100% of meals at this time. Labs and medications reviewed.   Pt denies recent wt change.  States "I'm good now that I'm not just on liquids."  Denies nutrition-related concerns or needs.  No nutrition interventions warranted at this time. If nutrition issues arise, please consult RD.   Loyce Dys, MS RD LDN Clinical Inpatient Dietitian Pager: [phone] Weekend/After hours pager: [phone]

## 2012-02-26 NOTE — ED Notes (Signed)
Charge RN from ICU called asking about pt's status.  Stated RN would call back down for report when ready.

## 2012-02-26 NOTE — Progress Notes (Signed)
*  PRELIMINARY RESULTS* Echocardiogram 2D Echocardiogram has been performed.  Jeryl Columbia 02/26/2012, 1:54 PM

## 2012-02-26 NOTE — Progress Notes (Signed)
02/26/12  Patient takes Lantus 64 units daily at home.  Recommend starting Lantus 30 units while in hospital if CBGs greater than 180 mg/dl and titrate as needed. Continue Novolog SENSITIVE correction scale TID. Will continue to follow while in hospital.  Smith Mince RN BSN CDE

## 2012-02-27 ENCOUNTER — Inpatient Hospital Stay (HOSPITAL_COMMUNITY): Payer: 59

## 2012-02-27 LAB — GLUCOSE, CAPILLARY
Glucose-Capillary: 197 mg/dL — ABNORMAL HIGH (ref 70–99)
Glucose-Capillary: 185 mg/dL — ABNORMAL HIGH (ref 70–99)
Glucose-Capillary: 263 mg/dL — ABNORMAL HIGH (ref 70–99)

## 2012-02-27 LAB — CBC
Hemoglobin: 10.5 g/dL — ABNORMAL LOW (ref 13.0–17.0)
RBC: 3.51 MIL/uL — ABNORMAL LOW (ref 4.22–5.81)
WBC: 1.3 10*3/uL — CL (ref 4.0–10.5)

## 2012-02-27 LAB — BASIC METABOLIC PANEL
CO2: 24 mEq/L (ref 19–32)
Glucose, Bld: 203 mg/dL — ABNORMAL HIGH (ref 70–99)
Potassium: 3.9 mEq/L (ref 3.5–5.1)
Sodium: 136 mEq/L (ref 135–145)

## 2012-02-27 LAB — PROCALCITONIN: Procalcitonin: <0.1 ng/mL

## 2012-02-27 MED ORDER — SENNOSIDES-DOCUSATE SODIUM 8.6-50 MG PO TABS
1.0000 | ORAL_TABLET | Freq: Two times a day (BID) | ORAL | Status: DC
  Administered 2012-02-27 – 2012-03-03 (×10): 1 via ORAL
  Filled 2012-02-27 (×12): qty 1

## 2012-02-27 MED ORDER — POLYETHYLENE GLYCOL 3350 17 G PO PACK
17.0000 g | PACK | Freq: Every day | ORAL | Status: DC
  Administered 2012-02-27 – 2012-03-03 (×6): 17 g via ORAL
  Filled 2012-02-27 (×6): qty 1

## 2012-02-27 MED ORDER — ACETAMINOPHEN 325 MG PO TABS
650.0000 mg | ORAL_TABLET | Freq: Four times a day (QID) | ORAL | Status: DC | PRN
  Administered 2012-02-27 – 2012-03-02 (×5): 650 mg via ORAL
  Filled 2012-02-27 (×5): qty 2

## 2012-02-27 MED ORDER — SENNOSIDES-DOCUSATE SODIUM 8.6-50 MG PO TABS
1.0000 | ORAL_TABLET | Freq: Two times a day (BID) | ORAL | Status: DC
  Filled 2012-02-27 (×2): qty 1

## 2012-02-27 NOTE — Progress Notes (Signed)
Report called to Selena Batten, Charity fundraiser.  Transferring via wheelchair to room 1420.

## 2012-02-27 NOTE — Progress Notes (Signed)
PULMONARY  / CRITICAL CARE MEDICINE  Name: Rodney Green MRN: [id number] DOB: 09-21-1945    LOS: 2  REFERRING MD :  Dr. Nehemiah Settle   CHIEF COMPLAINT:  Referral for PNA/Hypoxia   BRIEF PATIENT DESCRIPTION: 67 yo AAM former smoker with known hx of multiple myeloma on oral chemo w/ neutropenia admitted 1/2  w/ significant Hypoxia and multifocal patchy infiltrates on CT scan, suspected PNA. PCCM consulted on 1/3    CULTURES: BC x 2 1/2 >> 1/2 Influ > 1/2 Sputum >  ANTIBIOTICS: Vanc 1/2>> Levaquin 1/2>> Maxipime 1/2 >>   INTERVAL HISTORY:  Less dyspneic. No distress. SpO2 adequate on [place] O2   VITAL SIGNS: Temp:  [97.4 F (36.3 C)-98.3 F (36.8 C)] 97.4 F (36.3 C) (01/04 1318) Pulse Rate:  [69-85] 69  (01/04 1318) Resp:  [16-23] 20  (01/04 1318) BP: (100-130)/(60-83) 100/60 mmHg (01/04 1318) SpO2:  [89 %-97 %] 91 % (01/04 1446) HEMODYNAMICS:       INTAKE / OUTPUT: Intake/Output      01/03 0701 - 01/04 0700 01/04 0701 - 01/05 0700   P.O.  480   I.V. (mL/kg) 1050 (11.9) 190 (2.2)   IV Piggyback 550 150   Total Intake(mL/kg) 1600 (18.1) 820 (9.3)   Urine (mL/kg/hr) 1800 (0.9) 650 (0.7)   Total Output 1800 650   Net -200 +170          PHYSICAL EXAMINATION: GEN: A/Ox3; pleasant , NAD HEENT:  WNL NECK: No JVD noted RESP  B rales CARD:  RRR s M ABD: soft, NT, Nl BS EXT: no edema NEURO: no focal deficits   LABS: Cbc  Lab 02/27/12 0347 02/26/12 0520 02/25/12 1646  WBC 1.3* -- --  HGB 10.5* 11.7* 11.7*  HCT 31.6* 35.8* 36.3*  PLT 127* 148* 171    Chemistry   Lab 02/27/12 0347 02/26/12 0520 02/25/12 1646  NA 136 135 134*  K 3.9 4.6 5.1  CL 102 102 99  CO2 24 23 24   BUN 24* 33* 31*  CREATININE 1.34 1.42* 1.74*  CALCIUM 8.9 9.5 9.6  MG -- -- --  PHOS -- -- --  GLUCOSE 203* 176* 202*    Liver fxn  Lab 02/26/12 0520 02/22/12 1143  AST 17 8  ALT 9 9  ALKPHOS 118* 155*  BILITOT 0.5 0.46  PROT 6.0 6.4  ALBUMIN 2.7* 3.1*   coags No results  found for this basename: APTT:3,INR:3 in the last 168 hours Sepsis markers  Lab 02/27/12 0347 02/26/12 0520 02/25/12 2322 02/25/12 2320  LATICACIDVEN -- -- -- 1.1  PROCALCITON <0.10 <0.10 <0.10 --   Cardiac markers  Lab 02/26/12 1258 02/26/12 0520 02/25/12 2320  CKTOTAL -- -- --  CKMB -- -- --  TROPONINI 0.43* 0.43* 0.41*   BNP  Lab 02/25/12 2320  PROBNP 1121.0*   ABG  Lab 02/25/12 2302  PHART 7.442  PCO2ART 34.6*  PO2ART 265.0*  HCO3 23.2  TCO2 20.9    CBG trend  Lab 02/27/12 1648 02/27/12 1152 02/27/12 0752 02/26/12 2337 02/26/12 2138  GLUCAP 263* 238* 185* 197* 238*    CXR:  Increased bilateral symmetric infiltrates   ECG: NSR   DIAGNOSES: Principal Problem:  *Acute respiratory failure with hypoxia Active Problems:  Multiple myeloma in relapse  Acquired immunocompromised state  Diabetes mellitus, type 2  Hepatitis C  Stage III chronic kidney disease  Neutropenia  Pneumonia   PLAN: Continue current rx inc abx Agree with transfer out of ICU/SDU Repeat CXR  1/06 PCCM will see again Monday 1/06. Please call sooner PRN   Billy Fischer, MD ; [facility] [phone].  After 5:30 PM or weekends, call [phone]

## 2012-02-27 NOTE — Progress Notes (Signed)
eLink Physician-Brief Progress Note Patient Name: Rodney Green DOB: 08/19/1945 MRN: [id number]  Date of Service  02/27/2012   HPI/Events of Note  Headache following neb treatment   eICU Interventions  Plan: Tylenol 650 mg po q6 hours prn pain   Intervention Category Minor Interventions: Routine modifications to care plan (e.g. PRN medications for pain, fever)  Rodney Green 02/27/2012, 3:20 AM

## 2012-02-27 NOTE — Progress Notes (Signed)
Subjective: Breathing better but feels constipated. No c/o of pain or chills  Objective: Weight change:   Intake/Output Summary (Last 24 hours) at 02/27/12 1003 Last data filed at 02/27/12 0851  Gross per 24 hour  Intake   1740 ml  Output   1800 ml  Net    -60 ml   Filed Vitals:   02/27/12 0740 02/27/12 0800 02/27/12 0840 02/27/12 0843  BP:   119/71   Pulse:  74 73   Temp:  97.8 F (36.6 C)    TempSrc:  Oral    Resp:  18 19   Height:      Weight:      SpO2: 96% 94% 93% 93%   PE:  Alert and oriented Chest sounds clear bilaterally Cor:  RRR without murmur or gallop Abd: Soft and nondistended - nl BS's Ext: no edema  Lab Results:  [facility] 02/27/12 0347 02/26/12 0520  NA 136 135  K 3.9 4.6  CL 102 102  CO2 24 23  GLUCOSE 203* 176*  BUN 24* 33*  CREATININE 1.34 1.42*  CALCIUM 8.9 9.5  MG -- --  PHOS -- --    Basename 02/26/12 0520  AST 17  ALT 9  ALKPHOS 118*  BILITOT 0.5  PROT 6.0  ALBUMIN 2.7*   No results found for this basename: LIPASE:2,AMYLASE:2 in the last 72 hours  Basename 02/27/12 0347 02/26/12 0520 02/25/12 1646  WBC 1.3* 1.3* --  NEUTROABS -- 0.5* 0.8*  HGB 10.5* 11.7* --  HCT 31.6* 35.8* --  MCV 90.0 91.1 --  PLT 127* 148* --    Basename 02/26/12 1258 02/26/12 0520 02/25/12 2320  CKTOTAL -- -- --  CKMB -- -- --  CKMBINDEX -- -- --  TROPONINI 0.43* 0.43* 0.41*   No components found with this basename: POCBNP:3  Basename 02/25/12 1646  DDIMER 7.12*   No results found for this basename: HGBA1C:2 in the last 72 hours No results found for this basename: CHOL:2,HDL:2,LDLCALC:2,TRIG:2,CHOLHDL:2,LDLDIRECT:2 in the last 72 hours No results found for this basename: TSH,T4TOTAL,FREET3,T3FREE,THYROIDAB in the last 72 hours No results found for this basename: VITAMINB12:2,FOLATE:2,FERRITIN:2,TIBC:2,IRON:2,RETICCTPCT:2 in the last 72 hours  Studies/Results: Dg Chest 2 View  02/25/2012  *RADIOLOGY REPORT*  Clinical Data: Short of breath   CHEST - 2 VIEW  Comparison: 01/27/2012  Findings: Low lung volumes and bibasilar scarring is again demonstrated.  Normal heart size.  No effusion or edema.  No airspace consolidation.  IMPRESSION:  1.  Chronic low lung volumes with bibasilar atelectasis.   Original Report Authenticated By: Signa Kell, M.D.    Ct Chest Wo Contrast  02/25/2012  *RADIOLOGY REPORT*  Clinical Data: Hypoxia; shortness of breath.  Acute respiratory failure.  CT CHEST WITHOUT CONTRAST  Technique:  Multidetector CT imaging of the chest was performed following the standard protocol without IV contrast.  Comparison: CT of the chest performed 10/24/2011  Findings: Patchy bilateral airspace opacification is noted, favoring the upper lung lobes, though also seen within both lower lobes.  Focal density at the right lung base is thought to reflect scarring.  Previously noted nodularity at the left upper lobe has resolved, and was likely infectious in nature.  No pleural effusion or pneumothorax is seen.  Diffuse coronary artery calcifications are seen.  The mediastinum is otherwise unremarkable in appearance.  Scattered mediastinal nodes remain normal in size.  No pericardial effusion is identified.  Mild calcific atherosclerotic disease is noted along the proximal great vessels; the great vessels are otherwise grossly  unremarkable.  The thyroid gland is unremarkable in appearance.  No axillary lymphadenopathy is seen.  The visualized portions of the liver and spleen are unremarkable in appearance.  Mildly increased attenuation layering within the gallbladder may reflect sludge or tiny stones.  Nonspecific perinephric stranding is again noted bilaterally.  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  No acute osseous abnormalities are identified.  As noted on the prior study, there are compression deformities involving T5-L1, and changes of vertebroplasty are noted at L1.  These  appear grossly stable from the prior study.  IMPRESSION:  1.  Patchy bilateral airspace opacification, favoring the upper lung lobes, compatible with multifocal pneumonia. 2.  Focal density at the right lung base is thought to reflect scarring; the previously noted nodule at the left upper lobe has resolved and was likely infectious in nature. 3.  Diffuse coronary artery calcifications seen. 4.  Mildly increased attenuation layering within the gallbladder may reflect sludge or tiny stones. 5.  Extensive chronic compression deformities along the thoracic and upper lumbar spine, unchanged from the prior CT.   Original Report Authenticated By: Tonia Ghent, M.D.    Nm Pulmonary Perf And Vent  02/25/2012  *RADIOLOGY REPORT*  Clinical Data: Short of breath  NM PULMONARY VENTILATION AND PERFUSION SCAN  Radiopharmaceutical: 5.7MILLI CURIE MAA TECHNETIUM TO 11M ALBUMIN AGGREGATED  Comparison: 02/25/2012  Findings: Exam detail diminish due to low lung volumes.  The chest radiograph from today the lungs appear hypoinflated.  Within the right lung base there is a moderate sized perfusion defect with corresponding ventilation abnormality.  No unmatched segmental perfusion defects identified.  IMPRESSION:  1. Diminished exam detail due to low lung volumes.  Low probability for acute pulmonary embolus.   Original Report Authenticated By: Signa Kell, M.D.    Dg Chest Port 1 View  02/27/2012  *RADIOLOGY REPORT*  Clinical Data: Pneumonia.  Acute respiratory failure with hypoxia. Neutropenia.  PORTABLE CHEST - 1 VIEW  Comparison: 02/25/2012  Findings: Low lung volumes are again demonstrated.  There is increased patchy airspace disease seen in the peripheral lung zones bilaterally.  No evidence of pleural effusion.  Heart size is stable.  IMPRESSION: Increased patchy bilateral airspace disease.   Original Report Authenticated By: Myles Rosenthal, M.D.    Medications: Scheduled Meds:   . albuterol  2.5 mg Nebulization Q6H  .  antiseptic oral rinse  15 mL Mouth Rinse BID  . ceFEPime (MAXIPIME) IV  1 g Intravenous Q12H  . enoxaparin (LOVENOX) injection  40 mg Subcutaneous Q24H  . filgrastim  480 mcg Subcutaneous Daily  . insulin aspart  0-9 Units Subcutaneous TID WC  . insulin glargine  30 Units Subcutaneous Daily  . ipratropium  0.5 mg Nebulization Q6H  . levofloxacin (LEVAQUIN) IV  750 mg Intravenous Q24H  . OxyCODONE  40 mg Oral Q12H  . senna  1 tablet Oral Daily  . Tamsulosin HCl  0.4 mg Oral QPC supper  . vancomycin  750 mg Intravenous Q12H   Continuous Infusions:   . sodium chloride 50 mL/hr (02/27/12 0439)   PRN Meds:.acetaminophen  Assessment/Plan: Patient Active Problem List   Diagnosis Date Noted  . Pneumonia - plan to chnge to oral aBx tx in am 02/26/2012  . Acute respiratory failure with hypoxia - improved - may tx to telem bed 02/25/2012  . Neutropenia - related to Myeloma 02/25/2012  . Sepsis - resolved 10/27/2011  . Altered mental status -  resolved 10/22/2011  . Urinary retention - catherterize if no urine output this am 10/22/2011  . Stage III chronic kidney disease 10/22/2011  . Community acquired pneumonia - as above 10/21/2011  . Acquired immunocompromised state 10/21/2011  . Diabetes mellitus, type 2 - CBGs ok 10/21/2011  . Hypertension - controlled 10/21/2011  . Hepatitis C 10/21/2011  . CKD (chronic kidney disease) stage 3, GFR 30-59 ml/min 10/21/2011  . Hyperlipidemia 10/21/2011  . Normocytic anemia 10/21/2011  . Hyponatremia 10/21/2011  . Multiple myeloma in relapse - Dr. Shirline Frees - will notify 06/23/2011     LOS: 2 days   Rodney Green,Rodney Green 02/27/2012, 10:03 AM

## 2012-02-28 DIAGNOSIS — R0981 Nasal congestion: Secondary | ICD-10-CM | POA: Clinically undetermined

## 2012-02-28 DIAGNOSIS — I455 Other specified heart block: Secondary | ICD-10-CM | POA: Diagnosis not present

## 2012-02-28 MED ORDER — INSULIN ASPART 100 UNIT/ML ~~LOC~~ SOLN: 0.0000 [IU] | Freq: Three times a day (TID) | SUBCUTANEOUS | Status: DC

## 2012-02-28 MED ORDER — SALINE SPRAY 0.65 % NA SOLN
1.0000 | NASAL | Status: DC | PRN
  Filled 2012-02-28: qty 44

## 2012-02-28 MED ORDER — INSULIN ASPART 100 UNIT/ML ~~LOC~~ SOLN
0.0000 [IU] | Freq: Three times a day (TID) | SUBCUTANEOUS | Status: DC
  Administered 2012-02-28 (×2): 5 [IU] via SUBCUTANEOUS
  Administered 2012-02-29: 8 [IU] via SUBCUTANEOUS
  Administered 2012-02-29: 5 [IU] via SUBCUTANEOUS
  Administered 2012-02-29: 3 [IU] via SUBCUTANEOUS
  Administered 2012-03-01: 2 [IU] via SUBCUTANEOUS
  Administered 2012-03-01 (×2): 3 [IU] via SUBCUTANEOUS
  Administered 2012-03-02: 5 [IU] via SUBCUTANEOUS
  Administered 2012-03-02: 3 [IU] via SUBCUTANEOUS
  Administered 2012-03-02: 2 [IU] via SUBCUTANEOUS
  Administered 2012-03-03: 5 [IU] via SUBCUTANEOUS
  Administered 2012-03-03: 3 [IU] via SUBCUTANEOUS

## 2012-02-28 MED ORDER — INSULIN ASPART 100 UNIT/ML ~~LOC~~ SOLN
0.0000 [IU] | Freq: Every day | SUBCUTANEOUS | Status: DC
  Administered 2012-02-28: 2 [IU] via SUBCUTANEOUS

## 2012-02-28 NOTE — Progress Notes (Signed)
Pt very restless and [place]. V/S WNL. Complained about difficulty breathing~cleared out nose with tissue and it improved. Called MD to inform about restlessness. I gave patient relaxation techniques and breathing exercises. He settled down before MD called back. Pt is resting now. I will continue to monitor him throughout the shift.

## 2012-02-28 NOTE — Progress Notes (Signed)
Patients CBG 248. Called MD. No coverage at this time. Will order sliding scale for pt during rounding.

## 2012-02-28 NOTE — Progress Notes (Signed)
Patient had 2.78 sec pause on monitor. Upon assessment pt was still sleeping and resting comfortably. I will continue to monitor him, annd will pass this info on to the day-shift nurse.

## 2012-02-28 NOTE — Progress Notes (Signed)
Subjective: [name] is a very pleasant 67 year old male with recurrent multiple myeloma who presents with pancytopenia and bilateral upper lobe pneumonia.  Currently on Levaquin, Maxipime, and vancomycin.  Transferred from ICU yesterday.  Slept somewhat restlessly last night until he was able to "clear my nose and then I felt better".  Appetite is poor today.  States that he is breathing okay.  O2 saturations are good.  Screen for MRSA was negative nasally.  Blood cultures thus far are negative.  No respiratory cultures obtained.  Strep pneumo urinary antigen negative.  Blood sugars are running greater than 200  Objective: Weight change:   Intake/Output Summary (Last 24 hours) at 02/28/12 1007 Last data filed at 02/27/12 2256  Gross per 24 hour  Intake    430 ml  Output    850 ml  Net   -420 ml   Filed Vitals:   02/27/12 2201 02/27/12 2344 02/28/12 0601 02/28/12 0740  BP: 100/68 143/93 101/70   Pulse: 70 91 88   Temp: 97.5 F (36.4 C) 97.9 F (36.6 C) 97.3 F (36.3 C)   TempSrc: Oral Oral Oral   Resp: 18 18 18    Height:      Weight:      SpO2: 95% 98% 98% 98%   Alert and oriented  Chest sounds reveal rhonchi and rales bilaterally in the upper lobes - could not hear as well yesterday  Cor: RRR without murmur or gallop  Abd: Soft and nondistended - nl BS's  Ext: no edema  Lab Results:  [facility] 02/27/12 0347 02/26/12 0520  NA 136 135  K 3.9 4.6  CL 102 102  CO2 24 23  GLUCOSE 203* 176*  BUN 24* 33*  CREATININE 1.34 1.42*  CALCIUM 8.9 9.5  MG -- --  PHOS -- --    Basename 02/26/12 0520  AST 17  ALT 9  ALKPHOS 118*  BILITOT 0.5  PROT 6.0  ALBUMIN 2.7*   No results found for this basename: LIPASE:2,AMYLASE:2 in the last 72 hours  Basename 02/27/12 0347 02/26/12 0520 02/25/12 1646  WBC 1.3* 1.3* --  NEUTROABS -- 0.5* 0.8*  HGB 10.5* 11.7* --  HCT 31.6* 35.8* --  MCV 90.0 91.1 --  PLT 127* 148* --    Basename 02/26/12 1258 02/26/12 0520 02/25/12  2320  CKTOTAL -- -- --  CKMB -- -- --  CKMBINDEX -- -- --  TROPONINI 0.43* 0.43* 0.41*   No components found with this basename: POCBNP:3  Basename 02/25/12 1646  DDIMER 7.12*   No results found for this basename: HGBA1C:2 in the last 72 hours No results found for this basename: CHOL:2,HDL:2,LDLCALC:2,TRIG:2,CHOLHDL:2,LDLDIRECT:2 in the last 72 hours No results found for this basename: TSH,T4TOTAL,FREET3,T3FREE,THYROIDAB in the last 72 hours No results found for this basename: VITAMINB12:2,FOLATE:2,FERRITIN:2,TIBC:2,IRON:2,RETICCTPCT:2 in the last 72 hours  Studies/Results: Dg Chest Port 1 View  02/27/2012  *RADIOLOGY REPORT*  Clinical Data: Pneumonia.  Acute respiratory failure with hypoxia. Neutropenia.  PORTABLE CHEST - 1 VIEW  Comparison: 02/25/2012  Findings: Low lung volumes are again demonstrated.  There is increased patchy airspace disease seen in the peripheral lung zones bilaterally.  No evidence of pleural effusion.  Heart size is stable.  IMPRESSION: Increased patchy bilateral airspace disease.   Original Report Authenticated By: Myles Rosenthal, M.D.    Medications: Scheduled Meds:   . albuterol  2.5 mg Nebulization Q6H  . antiseptic oral rinse  15 mL Mouth Rinse BID  . ceFEPime (MAXIPIME) IV  1 g  Intravenous Q12H  . enoxaparin (LOVENOX) injection  40 mg Subcutaneous Q24H  . filgrastim  480 mcg Subcutaneous Daily  . insulin aspart  0-15 Units Subcutaneous TID WC  . insulin aspart  0-5 Units Subcutaneous QHS  . insulin glargine  30 Units Subcutaneous Daily  . ipratropium  0.5 mg Nebulization Q6H  . levofloxacin (LEVAQUIN) IV  750 mg Intravenous Q24H  . OxyCODONE  40 mg Oral Q12H  . polyethylene glycol  17 g Oral Daily  . senna-docusate  1 tablet Oral BID  . Tamsulosin HCl  0.4 mg Oral QPC supper   Continuous Infusions:   . sodium chloride 20 mL/hr (02/27/12 1043)   PRN Meds:.acetaminophen  Assessment/Plan: Patient Active Problem List   Diagnosis Date Noted  .  Pneumonia - oxygenating well.  We'll repeat chest x-ray in a.m.  Discontinue vancomycin but continue Maxipime and Levaquin for now  02/26/2012  . Acute respiratory failure with hypoxia - continue to monitor carefully.  Continue telemetry  02/25/2012  . Neutropenia - persists.  Notify oncology of admission.  Followed by Dr. Shirline Frees  02/25/2012  . Sepsis 10/27/2011  . Altered mental status - resolved  10/22/2011  . Urinary retention - resolved  10/22/2011  . Stage III chronic kidney disease 10/22/2011  . Community acquired pneumonia - but immunocompromised - continue broad-spectrum antibiotics for now  10/21/2011  . Acquired immunocompromised state 10/21/2011  . Diabetes mellitus, type 2 - increase sliding scale NovoLog insulin to moderate scale from sensitive  10/21/2011  . Hypertension - blood pressure controlled  10/21/2011  . Hepatitis C 10/21/2011  . CKD (chronic kidney disease) stage 3, GFR 30-59 ml/min 10/21/2011  . Hyperlipidemia 10/21/2011  . Normocytic anemia - hemoglobin 10.5  10/21/2011  . Hyponatremia - resolved  10/21/2011  . Multiple myeloma in relapse - as per Dr. Shirline Frees Sinus pause  - 2.78 seconds early this morning, 02/28/2012 - continue to monitor Nasal sinus congestion  - try nasal saline rinses 4 times daily  06/23/2011     LOS: 3 days   Rodney Green,Rodney Green 02/28/2012, 10:07 AM

## 2012-02-29 ENCOUNTER — Inpatient Hospital Stay (HOSPITAL_COMMUNITY): Payer: 59

## 2012-02-29 LAB — GLUCOSE, CAPILLARY: Glucose-Capillary: 214 mg/dL — ABNORMAL HIGH (ref 70–99)

## 2012-02-29 LAB — BASIC METABOLIC PANEL
Calcium: 9.3 mg/dL (ref 8.4–10.5)
GFR calc non Af Amer: 67 mL/min — ABNORMAL LOW (ref >90)
Glucose, Bld: 200 mg/dL — ABNORMAL HIGH (ref 70–99)
Sodium: 138 mEq/L (ref 135–145)

## 2012-02-29 LAB — CBC WITH DIFFERENTIAL/PLATELET
Basophils Absolute: 0 10*3/uL (ref 0.0–0.1)
Eosinophils Absolute: 0.3 10*3/uL (ref 0.0–0.7)
Eosinophils Relative: 12 % — ABNORMAL HIGH (ref 0–5)
MCH: 29.5 pg (ref 26.0–34.0)
MCHC: 32.8 g/dL (ref 30.0–36.0)
Monocytes Absolute: 0.7 10*3/uL (ref 0.1–1.0)
Neutrophils Relative %: 44 % (ref 43–77)
Platelets: 167 10*3/uL (ref 150–400)
RBC: 3.46 MIL/uL — ABNORMAL LOW (ref 4.22–5.81)
RDW: 15.3 % (ref 11.5–15.5)

## 2012-02-29 MED ORDER — MAGNESIUM CITRATE PO SOLN
0.5000 | Freq: Once | ORAL | Status: AC
  Administered 2012-02-29: 0.5 via ORAL

## 2012-02-29 MED ORDER — DEXTROSE 5 % IV SOLN
1.0000 g | Freq: Three times a day (TID) | INTRAVENOUS | Status: DC
  Administered 2012-02-29 – 2012-03-01 (×3): 1 g via INTRAVENOUS
  Filled 2012-02-29 (×4): qty 1

## 2012-02-29 NOTE — Care Management Note (Signed)
    Page 1 of 2   03/03/2012     1:55:48 PM   CARE MANAGEMENT NOTE 03/03/2012  Patient:  CHRSTOPHER, MALENFANT   Account Number:  [id number]  Date Initiated:  02/26/2012  Documentation initiated by:  DAVIS,RHONDA  Subjective/Objective Assessment:   pt with hypoxia upon admittsion requiring pnrb mask and 02 at 92%     Action/Plan:   from home   Anticipated DC Date:  03/02/2012   Anticipated DC Plan:  HOME/SELF CARE  In-house referral  NA      DC Planning Services  CM consult      PAC Choice  NA   Choice offered to / List presented to:  C-1 Patient   DME arranged  OXYGEN      DME agency  [facility]     HH arranged  NA      HH agency  NA   Status of service:  Completed, signed off Medicare Important Message given?  NA - LOS <3 / Initial given by admissions (If response is "NO", the following Medicare IM given date fields will be blank) Date Medicare IM given:   Date Additional Medicare IM given:    Discharge Disposition:  HOME/SELF CARE  Per UR Regulation:  Reviewed for med. necessity/level of care/duration of stay  If discussed at Long Length of Stay Meetings, dates discussed:   03/03/2012    Comments:  03/03/12 Dearies Meikle RN,BSN NCM [phone] 02 SATS QUALIFIED FOR HOME 02.[facility] DME [facility](LIASON) AWARE & FOLLOWING,INFORMED OF D/C.WILL NEED HOME 02 ORDER 2/LITER FLOW/DURATION.NSG UPDATED. 02/29/12 Kendryck Lacroix RN,BSN NCM [phone] IMPROVING,RHONCHI-PULM FOLLOWING.IV ABX,02,NEBS.IF HOME 02 NEEDED CAN ARRANGE IF QUALIFIES.  [id number] Earlene Plater, RN, BSN, CCM: CHART REVIEWED AND UPDATED.  Next chart review due on [id number]. NO DISCHARGE NEEDS PRESENT AT THIS TIME. CASE MANAGEMENT [phone]

## 2012-02-29 NOTE — Progress Notes (Signed)
Subjective: Overall patient doing well, without complaint of fever chills no productive sputum. Pulmonary input appreciated  Objective: Vital signs in last 24 hours: Temp:  [97.2 F (36.2 C)-98.6 F (37 C)] 98 F (36.7 C) (01/06 0611) Pulse Rate:  [72-85] 72  (01/06 0611) Resp:  [16-20] 20  (01/06 0611) BP: (111-143)/(73-91) 141/91 mmHg (01/06 0611) SpO2:  [96 %-100 %] 97 % (01/06 0945) Weight change:  Last BM Date: 02/25/12  Intake/Output from previous day: 01/05 0701 - 01/06 0700 In: 1260 [P.O.:300; I.V.:460; IV Piggyback:500] Out: 1300 [Urine:1300] Intake/Output this shift: Total I/O In: 480 [P.O.:480] Out: -   Resp: bibasilar rhonchi otherwise no rales no wheezes no rub  Lab Results:  Results for orders placed during the hospital encounter of 02/25/12 (from the past 24 hour(s))  GLUCOSE, CAPILLARY     Status: Abnormal   Collection Time   02/28/12  4:25 PM      Component Value Range   Glucose-Capillary 206 (*) 70 - 99 mg/dL  GLUCOSE, CAPILLARY     Status: Abnormal   Collection Time   02/28/12  8:07 PM      Component Value Range   Glucose-Capillary 222 (*) 70 - 99 mg/dL  BASIC METABOLIC PANEL     Status: Abnormal   Collection Time   02/29/12  4:36 AM      Component Value Range   Sodium 138  135 - 145 mEq/L   Potassium 3.9  3.5 - 5.1 mEq/L   Chloride 104  96 - 112 mEq/L   CO2 25  19 - 32 mEq/L   Glucose, Bld 200 (*) 70 - 99 mg/dL   BUN 17  6 - 23 mg/dL   Creatinine, Ser 8.29  0.50 - 1.35 mg/dL   Calcium 9.3  8.4 - 56.2 mg/dL   GFR calc non Af Amer 67 (*) >90 mL/min   GFR calc Af Amer 78 (*) >90 mL/min  CBC WITH DIFFERENTIAL     Status: Abnormal   Collection Time   02/29/12  4:36 AM      Component Value Range   WBC 2.6 (*) 4.0 - 10.5 K/uL   RBC 3.46 (*) 4.22 - 5.81 MIL/uL   Hemoglobin 10.2 (*) 13.0 - 17.0 g/dL   HCT 13.0 (*) 86.5 - 78.4 %   MCV 89.9  78.0 - 100.0 fL   MCH 29.5  26.0 - 34.0 pg   MCHC 32.8  30.0 - 36.0 g/dL   RDW 69.6  29.5 - 28.4 %   Platelets  167  150 - 400 K/uL   Neutrophils Relative 44  43 - 77 %   Lymphocytes Relative 17  12 - 46 %   Monocytes Relative 26 (*) 3 - 12 %   Eosinophils Relative 12 (*) 0 - 5 %   Basophils Relative 1  0 - 1 %   Neutro Abs 1.2 (*) 1.7 - 7.7 K/uL   Lymphs Abs 0.4 (*) 0.7 - 4.0 K/uL   Monocytes Absolute 0.7  0.1 - 1.0 K/uL   Eosinophils Absolute 0.3  0.0 - 0.7 K/uL   Basophils Absolute 0.0  0.0 - 0.1 K/uL   RBC Morphology ELLIPTOCYTES     WBC Morphology DOHLE BODIES    PRO B NATRIURETIC PEPTIDE     Status: Abnormal   Collection Time   02/29/12  4:36 AM      Component Value Range   Pro B Natriuretic peptide (BNP) 1069.0 (*) 0 - 125 pg/mL  GLUCOSE,  CAPILLARY     Status: Abnormal   Collection Time   02/29/12  7:51 AM      Component Value Range   Glucose-Capillary 196 (*) 70 - 99 mg/dL   Comment 1 Notify RN     Comment 2 Documented in Chart    GLUCOSE, CAPILLARY     Status: Abnormal   Collection Time   02/29/12 12:01 PM      Component Value Range   Glucose-Capillary 252 (*) 70 - 99 mg/dL   Comment 1 Notify RN     Comment 2 Documented in Chart        Studies/Results: Dg Chest Port 1 View  02/29/2012  *RADIOLOGY REPORT*  Clinical Data: Pneumonia.  PORTABLE CHEST - 1 VIEW  Comparison: Plain film of the chest 02/25/2012 and 02/27/2012. CT chest 02/25/2012.  Findings: Patchy bilateral airspace disease persists but has improved since the most recent plain film.  No pneumothorax is identified.  Heart size is normal.  There is likely a trace right pleural effusion.  IMPRESSION: Some improvement in bilateral airspace disease.   Original Report Authenticated By: Holley Dexter, M.D.     Medications:  Prior to Admission:  Prescriptions prior to admission  Medication Sig Dispense Refill  . amLODipine (NORVASC) 5 MG tablet Take 5 mg by mouth daily.        Marland Kitchen aspirin 81 MG tablet Take 81 mg by mouth daily.      Marland Kitchen atenolol (TENORMIN) 100 MG tablet Take 100 mg by mouth daily.        Marland Kitchen dexamethasone  (DECADRON) 4 MG tablet Take 10 tablets (40 mg total) by mouth once a week. On fridays  40 tablet  3  . docusate sodium (COLACE) 100 MG capsule Take 200-300 mg by mouth 2 (two) times daily. Takes 2 capsules(200mg ) every morning and 3 capsules(300mg ) every night      . filgrastim (NEUPOGEN) 480 MCG/0.8ML SOLN injection Inject 0.8 mLs (480 mcg total) into the skin as directed.  10 Syringe  1  . insulin glargine (LANTUS) 100 UNIT/ML injection Inject 64 Units into the skin at bedtime.       Marland Kitchen NOVOLOG MIX 70/30 (70-30) 100 UNIT/ML injection Inject 2-28 Units into the skin once a week. Per sliding scale.Marland KitchenMarland KitchenOnly takes morning after taking dexamethasone      . oxyCODONE (OXYCONTIN) 40 MG 12 hr tablet Take 1 tablet (40 mg total) by mouth every 12 (twelve) hours.  60 tablet  0  . pomalidomide (POMALYST) 4 MG capsule Take 1 capsule (4 mg total) by mouth daily.  21 capsule  0  . senna (SENOKOT) 8.6 MG tablet Take 1 tablet by mouth daily.      . simvastatin (ZOCOR) 10 MG tablet Take 10 mg by mouth at bedtime.        . Tamsulosin HCl (FLOMAX) 0.4 MG CAPS Take 1 capsule (0.4 mg total) by mouth daily after supper.  30 capsule  2  . valsartan-hydrochlorothiazide (DIOVAN-HCT) 160-12.5 MG per tablet Take 1 tablet by mouth daily.         Scheduled:   . albuterol  2.5 mg Nebulization Q6H  . antiseptic oral rinse  15 mL Mouth Rinse BID  . ceFEPime (MAXIPIME) IV  1 g Intravenous Q8H  . enoxaparin (LOVENOX) injection  40 mg Subcutaneous Q24H  . insulin aspart  0-15 Units Subcutaneous TID WC  . insulin aspart  0-5 Units Subcutaneous QHS  . insulin glargine  30 Units Subcutaneous Daily  .  ipratropium  0.5 mg Nebulization Q6H  . levofloxacin (LEVAQUIN) IV  750 mg Intravenous Q24H  . magnesium citrate  0.5 Bottle Oral Once  . OxyCODONE  40 mg Oral Q12H  . polyethylene glycol  17 g Oral Daily  . senna-docusate  1 tablet Oral BID  . Tamsulosin HCl  0.4 mg Oral QPC supper   Continuous:   . sodium chloride 20 mL/hr at  02/29/12 0020    Assessment/Plan: Principal Problem:  *Acute respiratory failure with hypoxia Active Problems:  Multiple myeloma in relapse  Acquired immunocompromised state  Diabetes mellitus, type 2  Hepatitis C  Stage III chronic kidney disease  Neutropenia  Pneumonia  Sinus pause  Sinus congestion   Clinically patient is stable from his pneumonia., Transition antibiotics to oral in the next 24 hours, continue treatment as outline    LOS: 4 days   Kaliyah Gladman D 02/29/2012, 12:53 PM

## 2012-02-29 NOTE — Progress Notes (Signed)
PULMONARY  / CRITICAL CARE MEDICINE  Name: Rodney Green MRN: [id number] DOB: 1945-05-18    LOS: 4  REFERRING MD :  Dr. Nehemiah Settle   CHIEF COMPLAINT:  Referral for PNA/Hypoxia   BRIEF PATIENT DESCRIPTION: 67 yo AAM former smoker with known hx of multiple myeloma on oral chemo w/ neutropenia admitted 1/2  w/ significant Hypoxia and multifocal patchy infiltrates on CT scan, suspected PNA. PCCM consulted on 1/3    CULTURES: BC x 2 1/2 >> 1/2 Influ > 1/2 Sputum >  ANTIBIOTICS: Vanc 1/2>>dc 1-5 Levaquin 1/2>> Maxipime 1/2 >>   INTERVAL HISTORY:  NAD  VITAL SIGNS: Temp:  [97.2 F (36.2 C)-98.6 F (37 C)] 98 F (36.7 C) (01/06 0865) Pulse Rate:  [72-85] 72  (01/06 0611) Resp:  [16-20] 20  (01/06 0611) BP: (111-143)/(73-91) 141/91 mmHg (01/06 0611) SpO2:  [96 %-100 %] 97 % (01/06 0945) FIO2  4lpm        INTAKE / OUTPUT: Intake/Output      01/05 0701 - 01/06 0700 01/06 0701 - 01/07 0700   P.O. 300 480   I.V. (mL/kg) 460 (5.2)    IV Piggyback 500    Total Intake(mL/kg) 1260 (14.3) 480 (5.4)   Urine (mL/kg/hr) 1300 (0.6)    Total Output 1300    Net -40 +480          PHYSICAL EXAMINATION: GEN: A/Ox3; pleasant , NAD HEENT:  WNL NECK: No JVD noted RESP  Decreased in bases CARD:  RRR s M ABD: soft, NT, Nl BS EXT: no edema NEURO: no focal deficits   LABS: Cbc  Lab 02/29/12 0436 02/27/12 0347 02/26/12 0520  WBC 2.6* -- --  HGB 10.2* 10.5* 11.7*  HCT 31.1* 31.6* 35.8*  PLT 167 127* 148*    Chemistry   Lab 02/29/12 0436 02/27/12 0347 02/26/12 0520  NA 138 136 135  K 3.9 3.9 4.6  CL 104 102 102  CO2 25 24 23   BUN 17 24* 33*  CREATININE 1.13 1.34 1.42*  CALCIUM 9.3 8.9 9.5  MG -- -- --  PHOS -- -- --  GLUCOSE 200* 203* 176*    Liver fxn  Lab 02/26/12 0520 02/22/12 1143  AST 17 8  ALT 9 9  ALKPHOS 118* 155*  BILITOT 0.5 0.46  PROT 6.0 6.4  ALBUMIN 2.7* 3.1*   coags No results found for this basename: APTT:3,INR:3 in the last 168  hours Sepsis markers  Lab 02/27/12 0347 02/26/12 0520 02/25/12 2322 02/25/12 2320  LATICACIDVEN -- -- -- 1.1  PROCALCITON <0.10 <0.10 <0.10 --   Cardiac markers  Lab 02/26/12 1258 02/26/12 0520 02/25/12 2320  CKTOTAL -- -- --  CKMB -- -- --  TROPONINI 0.43* 0.43* 0.41*   BNP  Lab 02/29/12 0436 02/25/12 2320  PROBNP 1069.0* 1121.0*   ABG  Lab 02/25/12 2302  PHART 7.442  PCO2ART 34.6*  PO2ART 265.0*  HCO3 23.2  TCO2 20.9    CBG trend  Lab 02/29/12 0751 02/28/12 2007 02/28/12 1625 02/28/12 1117 02/28/12 0752  GLUCAP 196* 222* 206* 245* 243*    CXR:   Dg Chest Port 1 View  02/29/2012  *RADIOLOGY REPORT*  Clinical Data: Pneumonia.  PORTABLE CHEST - 1 VIEW  Comparison: Plain film of the chest 02/25/2012 and 02/27/2012. CT chest 02/25/2012.  Findings: Patchy bilateral airspace disease persists but has improved since the most recent plain film.  No pneumothorax is identified.  Heart size is normal.  There is likely a trace right  pleural effusion.  IMPRESSION: Some improvement in bilateral airspace disease.   Original Report Authenticated By: Holley Dexter, M.D.      ECG: NSR   DIAGNOSES: Principal Problem:  *Acute respiratory failure with hypoxia Active Problems:  Multiple myeloma in relapse  Acquired immunocompromised state  Diabetes mellitus, type 2  Hepatitis C  Stage III chronic kidney disease  Neutropenia  Pneumonia  Sinus pause  Sinus congestion   PLAN: Continue current rx inc abx PCCM available PRN    [name] Minor ACNP Adolph Pollack PCCM Pager [phone] till 3 pm If no answer page [phone] 02/29/2012, 11:13 AM  Pt seen and independently examined, agree with above findings and plan   [name], MD Pulmonary and Critical Care Medicine [facility] Cell [phone] After 5:30 PM or weekends, call [phone]

## 2012-03-01 LAB — CBC
Platelets: 166 10*3/uL (ref 150–400)
RDW: 15.1 % (ref 11.5–15.5)
WBC: 2.9 10*3/uL — ABNORMAL LOW (ref 4.0–10.5)

## 2012-03-01 LAB — GLUCOSE, CAPILLARY: Glucose-Capillary: 149 mg/dL — ABNORMAL HIGH (ref 70–99)

## 2012-03-01 MED ORDER — LEVOFLOXACIN 750 MG PO TABS
750.0000 mg | ORAL_TABLET | Freq: Every day | ORAL | Status: DC
  Administered 2012-03-01 – 2012-03-03 (×3): 750 mg via ORAL
  Filled 2012-03-01 (×3): qty 1

## 2012-03-01 NOTE — Progress Notes (Signed)
ANTIBIOTIC CONSULT NOTE - FOLLOW UP  Pharmacy Levaquin, Cefepime Indication: pneumonia, sepsis  Allergies  Allergen Reactions  . Ace Inhibitors     He is not sure about allergy to ace inhibitors   Patient Measurements: Height: 5' 6.93" (170 cm) Weight: 194 lb 7.1 oz (88.2 kg) IBW/kg (Calculated) : 65.94   Labs:  Basename 02/29/12 0436  WBC 2.6*  HGB 10.2*  PLT 167  LABCREA --  CREATININE 1.13   Estimated Creatinine Clearance: 69.9 ml/min (by C-G formula based on Cr of 1.13).  Antibiotics: 1/2 >> Cefepime >> 1/2 >> Vanco >> 1/5 1/2 >> Levaquin >>  Assessment:  53 yom with h/o MM currently receiving oral chemo presented 01/02 with c/o progressing SOB. CBC revealved neutropenia (this was noted outpatient 12/17 and pt received Neupogen sq x 3d). Patient with h/o CKD stage III. CT scan revealed multifocal pneumonia.  Today is day #5 of IV antibiotics - Levaquin/Cefepime. Vanc dc'd 1/5.     Renal function improved (Scr now 1.13 for normalized and CG creatinine of ~70 ml/min).   Cultures all negative, Afebrile.  Goal of Therapy:  Appropriate renal adjustment of antibiotics  Plan:   Continue Cefepime 1gm IV q8h   Continue Levaquin to 750 mg IV q24h  F/U transition to PO abx - plan today per MD note yesterday  Loralee Pacas, PharmD, BCPS Pager: [phone] 03/01/2012 8:47 AM

## 2012-03-01 NOTE — Progress Notes (Signed)
Subjective: No problems overnight, no fever or chills no productive cough. Patient still with oxygen requirement.  Objective: Vital signs in last 24 hours: Temp:  [97.6 F (36.4 C)-97.7 F (36.5 C)] 97.6 F (36.4 C) (01/07 0503) Pulse Rate:  [74-85] 76  (01/07 0503) Resp:  [16-20] 16  (01/07 0503) BP: (145-156)/(89-101) 152/95 mmHg (01/07 0503) SpO2:  [90 %-100 %] 97 % (01/07 0831) Weight change:  Last BM Date: 02/25/12  Intake/Output from previous day: 01/06 0701 - 01/07 0700 In: 1980 [P.O.:1200; I.V.:480; IV Piggyback:300] Out: 450 [Urine:450] Intake/Output this shift:    General appearance: alert and cooperative Resp: bibasilar rhonchi no rales no expiratory wheeze Cardio: regular rate and rhythm, S1, S2 normal, no murmur, click, rub or gallop  Lab Results:  Results for orders placed during the hospital encounter of 02/25/12 (from the past 24 hour(s))  GLUCOSE, CAPILLARY     Status: Abnormal   Collection Time   02/29/12 12:01 PM      Component Value Range   Glucose-Capillary 252 (*) 70 - 99 mg/dL   Comment 1 Notify RN     Comment 2 Documented in Chart    GLUCOSE, CAPILLARY     Status: Abnormal   Collection Time   02/29/12  5:29 PM      Component Value Range   Glucose-Capillary 214 (*) 70 - 99 mg/dL   Comment 1 Notify RN     Comment 2 Documented in Chart    GLUCOSE, CAPILLARY     Status: Abnormal   Collection Time   02/29/12  9:16 PM      Component Value Range   Glucose-Capillary 202 (*) 70 - 99 mg/dL  GLUCOSE, CAPILLARY     Status: Abnormal   Collection Time   03/01/12 12:41 AM      Component Value Range   Glucose-Capillary 155 (*) 70 - 99 mg/dL  GLUCOSE, CAPILLARY     Status: Abnormal   Collection Time   03/01/12  7:29 AM      Component Value Range   Glucose-Capillary 149 (*) 70 - 99 mg/dL      Studies/Results: Dg Chest Port 1 View  02/29/2012  *RADIOLOGY REPORT*  Clinical Data: Pneumonia.  PORTABLE CHEST - 1 VIEW  Comparison: Plain film of the chest  02/25/2012 and 02/27/2012. CT chest 02/25/2012.  Findings: Patchy bilateral airspace disease persists but has improved since the most recent plain film.  No pneumothorax is identified.  Heart size is normal.  There is likely a trace right pleural effusion.  IMPRESSION: Some improvement in bilateral airspace disease.   Original Report Authenticated By: Holley Dexter, M.D.     Medications:  Prior to Admission:  Prescriptions prior to admission  Medication Sig Dispense Refill  . amLODipine (NORVASC) 5 MG tablet Take 5 mg by mouth daily.        Marland Kitchen aspirin 81 MG tablet Take 81 mg by mouth daily.      Marland Kitchen atenolol (TENORMIN) 100 MG tablet Take 100 mg by mouth daily.        Marland Kitchen dexamethasone (DECADRON) 4 MG tablet Take 10 tablets (40 mg total) by mouth once a week. On fridays  40 tablet  3  . docusate sodium (COLACE) 100 MG capsule Take 200-300 mg by mouth 2 (two) times daily. Takes 2 capsules(200mg ) every morning and 3 capsules(300mg ) every night      . filgrastim (NEUPOGEN) 480 MCG/0.8ML SOLN injection Inject 0.8 mLs (480 mcg total) into the skin as directed.  10 Syringe  1  . insulin glargine (LANTUS) 100 UNIT/ML injection Inject 64 Units into the skin at bedtime.       Marland Kitchen NOVOLOG MIX 70/30 (70-30) 100 UNIT/ML injection Inject 2-28 Units into the skin once a week. Per sliding scale.Marland KitchenMarland KitchenOnly takes morning after taking dexamethasone      . oxyCODONE (OXYCONTIN) 40 MG 12 hr tablet Take 1 tablet (40 mg total) by mouth every 12 (twelve) hours.  60 tablet  0  . pomalidomide (POMALYST) 4 MG capsule Take 1 capsule (4 mg total) by mouth daily.  21 capsule  0  . senna (SENOKOT) 8.6 MG tablet Take 1 tablet by mouth daily.      . simvastatin (ZOCOR) 10 MG tablet Take 10 mg by mouth at bedtime.        . Tamsulosin HCl (FLOMAX) 0.4 MG CAPS Take 1 capsule (0.4 mg total) by mouth daily after supper.  30 capsule  2  . valsartan-hydrochlorothiazide (DIOVAN-HCT) 160-12.5 MG per tablet Take 1 tablet by mouth daily.          Scheduled:   . albuterol  2.5 mg Nebulization Q6H  . antiseptic oral rinse  15 mL Mouth Rinse BID  . ceFEPime (MAXIPIME) IV  1 g Intravenous Q8H  . enoxaparin (LOVENOX) injection  40 mg Subcutaneous Q24H  . insulin aspart  0-15 Units Subcutaneous TID WC  . insulin aspart  0-5 Units Subcutaneous QHS  . insulin glargine  30 Units Subcutaneous Daily  . ipratropium  0.5 mg Nebulization Q6H  . levofloxacin (LEVAQUIN) IV  750 mg Intravenous Q24H  . OxyCODONE  40 mg Oral Q12H  . polyethylene glycol  17 g Oral Daily  . senna-docusate  1 tablet Oral BID  . Tamsulosin HCl  0.4 mg Oral QPC supper   Continuous:   . sodium chloride 20 mL/hr at 02/29/12 0020    Assessment/Plan: Acute respiratory failure with hypoxia secondary to pneumonia. Patient still with oxygen requirement, currently afebrile, without productive cough. Cultures to date are negative, antibiotics will be stream lined to Levaquin if remains stable hopefully discharge soon. Check followup CBC today Multiple myeloma Diabetes type 2 Hepatitis C Stage III chronic kidney disease at baseline Neutropenia    LOS: 5 days   Dorismar Chay D 03/01/2012, 8:57 AM

## 2012-03-02 LAB — LEGIONELLA ANTIGEN, URINE: Legionella Antigen, Urine: NEGATIVE

## 2012-03-02 LAB — GLUCOSE, CAPILLARY: Glucose-Capillary: 127 mg/dL — ABNORMAL HIGH (ref 70–99)

## 2012-03-02 NOTE — Progress Notes (Signed)
Subjective: No new problems overnight, patient remains afebrile. He still has the oxygen requirement. Unfortunately patient may require oxygen at time of discharge. There is no productive cough no shortness of breath no wheezing. He is tolerant of p.o. Intake. White count is stable  Objective: Vital signs in last 24 hours: Temp:  [97.4 F (36.3 C)-97.9 F (36.6 C)] 97.9 F (36.6 C) (01/08 0610) Pulse Rate:  [78-87] 87  (01/08 0610) Resp:  [16-18] 18  (01/08 0610) BP: (134-160)/(88-101) 160/88 mmHg (01/08 0610) SpO2:  [93 %-100 %] 93 % (01/08 0610) Weight change:  Last BM Date: 03/01/12  Intake/Output from previous day: 01/07 0701 - 01/08 0700 In: 360 [P.O.:360] Out: 1302 [Urine:1300; Stool:2] Intake/Output this shift:    General appearance: alert and cooperative Resp: moderate air movement bilateral, with occasional rhonchi in the base. Cardio: regular rate and rhythm, S1, S2 normal, no murmur, click, rub or gallop  Lab Results:  Results for orders placed during the hospital encounter of 02/25/12 (from the past 24 hour(s))  CBC     Status: Abnormal   Collection Time   03/01/12 10:10 AM      Component Value Range   WBC 2.9 (*) 4.0 - 10.5 K/uL   RBC 3.36 (*) 4.22 - 5.81 MIL/uL   Hemoglobin 9.9 (*) 13.0 - 17.0 g/dL   HCT 19.1 (*) 47.8 - 29.5 %   MCV 90.5  78.0 - 100.0 fL   MCH 29.5  26.0 - 34.0 pg   MCHC 32.6  30.0 - 36.0 g/dL   RDW 62.1  30.8 - 65.7 %   Platelets 166  150 - 400 K/uL  GLUCOSE, CAPILLARY     Status: Abnormal   Collection Time   03/01/12 11:43 AM      Component Value Range   Glucose-Capillary 171 (*) 70 - 99 mg/dL   Comment 1 Notify RN     Comment 2 Documented in Chart    GLUCOSE, CAPILLARY     Status: Abnormal   Collection Time   03/01/12  4:41 PM      Component Value Range   Glucose-Capillary 191 (*) 70 - 99 mg/dL   Comment 1 Notify RN     Comment 2 Documented in Chart    GLUCOSE, CAPILLARY     Status: Abnormal   Collection Time   03/01/12  9:01 PM     Component Value Range   Glucose-Capillary 146 (*) 70 - 99 mg/dL  GLUCOSE, CAPILLARY     Status: Abnormal   Collection Time   03/02/12  7:37 AM      Component Value Range   Glucose-Capillary 162 (*) 70 - 99 mg/dL      Studies/Results: No results found.  Medications:  Prior to Admission:  Prescriptions prior to admission  Medication Sig Dispense Refill  . amLODipine (NORVASC) 5 MG tablet Take 5 mg by mouth daily.        Marland Kitchen aspirin 81 MG tablet Take 81 mg by mouth daily.      Marland Kitchen atenolol (TENORMIN) 100 MG tablet Take 100 mg by mouth daily.        Marland Kitchen dexamethasone (DECADRON) 4 MG tablet Take 10 tablets (40 mg total) by mouth once a week. On fridays  40 tablet  3  . docusate sodium (COLACE) 100 MG capsule Take 200-300 mg by mouth 2 (two) times daily. Takes 2 capsules(200mg ) every morning and 3 capsules(300mg ) every night      . filgrastim (NEUPOGEN) 480 MCG/0.8ML SOLN  injection Inject 0.8 mLs (480 mcg total) into the skin as directed.  10 Syringe  1  . insulin glargine (LANTUS) 100 UNIT/ML injection Inject 64 Units into the skin at bedtime.       Marland Kitchen NOVOLOG MIX 70/30 (70-30) 100 UNIT/ML injection Inject 2-28 Units into the skin once a week. Per sliding scale.Marland KitchenMarland KitchenOnly takes morning after taking dexamethasone      . oxyCODONE (OXYCONTIN) 40 MG 12 hr tablet Take 1 tablet (40 mg total) by mouth every 12 (twelve) hours.  60 tablet  0  . pomalidomide (POMALYST) 4 MG capsule Take 1 capsule (4 mg total) by mouth daily.  21 capsule  0  . senna (SENOKOT) 8.6 MG tablet Take 1 tablet by mouth daily.      . simvastatin (ZOCOR) 10 MG tablet Take 10 mg by mouth at bedtime.        . Tamsulosin HCl (FLOMAX) 0.4 MG CAPS Take 1 capsule (0.4 mg total) by mouth daily after supper.  30 capsule  2  . valsartan-hydrochlorothiazide (DIOVAN-HCT) 160-12.5 MG per tablet Take 1 tablet by mouth daily.         Scheduled:   . albuterol  2.5 mg Nebulization Q6H  . antiseptic oral rinse  15 mL Mouth Rinse BID  . enoxaparin  (LOVENOX) injection  40 mg Subcutaneous Q24H  . insulin aspart  0-15 Units Subcutaneous TID WC  . insulin aspart  0-5 Units Subcutaneous QHS  . insulin glargine  30 Units Subcutaneous Daily  . ipratropium  0.5 mg Nebulization Q6H  . levofloxacin  750 mg Oral Daily  . OxyCODONE  40 mg Oral Q12H  . polyethylene glycol  17 g Oral Daily  . senna-docusate  1 tablet Oral BID  . Tamsulosin HCl  0.4 mg Oral QPC supper   Continuous:   Assessment/Plan: Acute respiratory failure with hypoxia secondary to pneumonia. Patient still with oxygen requirement, currently afebrile, without productive cough. Patient remains clinically stable, start discharge plans, more than likely he will need home oxygen.  Multiple myeloma , IgA subtype diagnosed November 2009, current chemotherapy on hold ( Pomalyst and  Decadron) Diabetes type 2  Hepatitis C  Stage III chronic kidney disease at baseline  Neutropenia   LOS: 6 days   Rodney Green D 03/02/2012, 8:39 AM

## 2012-03-03 LAB — CBC
HCT: 31.7 % — ABNORMAL LOW (ref 39.0–52.0)
Hemoglobin: 10.2 g/dL — ABNORMAL LOW (ref 13.0–17.0)
RDW: 15.2 % (ref 11.5–15.5)
WBC: 3.6 10*3/uL — ABNORMAL LOW (ref 4.0–10.5)

## 2012-03-03 LAB — CULTURE, BLOOD (ROUTINE X 2): Culture: NO GROWTH

## 2012-03-03 LAB — GLUCOSE, CAPILLARY
Glucose-Capillary: 158 mg/dL — ABNORMAL HIGH (ref 70–99)
Glucose-Capillary: 204 mg/dL — ABNORMAL HIGH (ref 70–99)

## 2012-03-03 MED ORDER — LEVOFLOXACIN 750 MG PO TABS: 750.0000 mg | ORAL_TABLET | Freq: Every day | ORAL | Status: AC

## 2012-03-03 NOTE — Progress Notes (Signed)
Pt's o2 sat resting on room air 80%

## 2012-03-03 NOTE — Discharge Planning (Addendum)
Physician Discharge Summary  Patient ID: Rodney Green MRN: [id number] DOB/AGE: May 02, 1945 67 y.o.  Admit date: 02/25/2012 Discharge date: 03/03/2012  Admission Diagnoses:  Discharge Diagnoses:  Principal Problem:  *Acute respiratory failure with hypoxia Active Problems:  Multiple myeloma in relapse  Acquired immunocompromised state  Diabetes mellitus, type 2  Hepatitis C  Stage III chronic kidney disease  Neutropenia  Pneumonia  Sinus pause  Sinus congestion   Discharged Condition: stable  Hospital Course:  Patient presented to the hospital with complaint of shortness of breath. Evaluation in the hospital reveal hypoxia, Chest x-ray showing no airspace consolidation, chronic low lung lives with bibasilar atelectasis. VQ scan low probability for pulmonary embolism. Labs also showed neutropenia. Patient was started on broad-spectrum antibiotics, CT scan of the chest was obtained which confirm pneumonia, multilobar. Patient was seen in consultation by oncology, Neupogen was started, pulmonaryCritical care also saw the patient.. Patient was continued on broad-spectrum antibiotics, blood cultures were negative, Strep pneumonia antigen was negative.. There was no fever no productive sputum. He remained with oxygen requirement during his hospitalization. Patient's antibiotics were streamline, he remained clinically stable, he will be discharged to complete a ten-day course of Levaquin. He will be discharged 2 L of oxygen. Patient's oral chemotherapy has been stopped, he will resume this at the discretion of his oncologist  Consults: Treatment Team:  Si Gaul, MD  Significant Diagnostic Studies:Dg Chest 2 View  02/25/2012  *RADIOLOGY REPORT*  Clinical Data: Short of breath  CHEST - 2 VIEW  Comparison: 01/27/2012  Findings: Low lung volumes and bibasilar scarring is again demonstrated.  Normal heart size.  No effusion or edema.  No airspace consolidation.  IMPRESSION:  1.  Chronic low  lung volumes with bibasilar atelectasis.   Original Report Authenticated By: Signa Kell, M.D.    Ct Chest Wo Contrast  02/25/2012  *RADIOLOGY REPORT*  Clinical Data: Hypoxia; shortness of breath.  Acute respiratory failure.  CT CHEST WITHOUT CONTRAST  Technique:  Multidetector CT imaging of the chest was performed following the standard protocol without IV contrast.  Comparison: CT of the chest performed 10/24/2011  Findings: Patchy bilateral airspace opacification is noted, favoring the upper lung lobes, though also seen within both lower lobes.  Focal density at the right lung base is thought to reflect scarring.  Previously noted nodularity at the left upper lobe has resolved, and was likely infectious in nature.  No pleural effusion or pneumothorax is seen.  Diffuse coronary artery calcifications are seen.  The mediastinum is otherwise unremarkable in appearance.  Scattered mediastinal nodes remain normal in size.  No pericardial effusion is identified.  Mild calcific atherosclerotic disease is noted along the proximal great vessels; the great vessels are otherwise grossly unremarkable.  The thyroid gland is unremarkable in appearance.  No axillary lymphadenopathy is seen.  The visualized portions of the liver and spleen are unremarkable in appearance.  Mildly increased attenuation layering within the gallbladder may reflect sludge or tiny stones.  Nonspecific perinephric stranding is again noted bilaterally.  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  No acute osseous abnormalities are identified.  As noted on the prior study, there are compression deformities involving T5-L1, and changes of vertebroplasty are noted at L1.  These appear grossly stable from the prior study.  IMPRESSION:  1.  Patchy bilateral airspace opacification, favoring the upper lung lobes, compatible with multifocal pneumonia. 2.  Focal density at the right lung base is  thought to reflect scarring; the previously noted nodule at the left upper lobe has resolved and was likely infectious in nature. 3.  Diffuse coronary artery calcifications seen. 4.  Mildly increased attenuation layering within the gallbladder may reflect sludge or tiny stones. 5.  Extensive chronic compression deformities along the thoracic and upper lumbar spine, unchanged from the prior CT.   Original Report Authenticated By: Tonia Ghent, M.D.    Nm Pulmonary Perf And Vent  02/25/2012  *RADIOLOGY REPORT*  Clinical Data: Short of breath  NM PULMONARY VENTILATION AND PERFUSION SCAN  Radiopharmaceutical: 5.7MILLI CURIE MAA TECHNETIUM TO 21M ALBUMIN AGGREGATED  Comparison: 02/25/2012  Findings: Exam detail diminish due to low lung volumes.  The chest radiograph from today the lungs appear hypoinflated.  Within the right lung base there is a moderate sized perfusion defect with corresponding ventilation abnormality.  No unmatched segmental perfusion defects identified.  IMPRESSION:  1. Diminished exam detail due to low lung volumes.  Low probability for acute pulmonary embolus.   Original Report Authenticated By: Signa Kell, M.D.    Dg Chest Port 1 View  02/29/2012  *RADIOLOGY REPORT*  Clinical Data: Pneumonia.  PORTABLE CHEST - 1 VIEW  Comparison: Plain film of the chest 02/25/2012 and 02/27/2012. CT chest 02/25/2012.  Findings: Patchy bilateral airspace disease persists but has improved since the most recent plain film.  No pneumothorax is identified.  Heart size is normal.  There is likely a trace right pleural effusion.  IMPRESSION: Some improvement in bilateral airspace disease.   Original Report Authenticated By: Holley Dexter, M.D.    Dg Chest Port 1 View  02/27/2012  *RADIOLOGY REPORT*  Clinical Data: Pneumonia.  Acute respiratory failure with hypoxia. Neutropenia.  PORTABLE CHEST - 1 VIEW  Comparison: 02/25/2012  Findings: Low lung volumes are again demonstrated.  There is increased patchy  airspace disease seen in the peripheral lung zones bilaterally.  No evidence of pleural effusion.  Heart size is stable.  IMPRESSION: Increased patchy bilateral airspace disease.   Original Report Authenticated By: Myles Rosenthal, M.D.       Discharge Exam: Blood pressure 152/91, pulse 86, temperature 98.2 F (36.8 C), temperature source Oral, resp. rate 18, height 5' 6.93" (1.7 m), weight 88.2 kg (194 lb 7.1 oz), SpO2 80.00%. General appearance: alert and cooperative Resp: moderate air movement bilateral without rales wheezes occasional rhonchi in the base bilateral Cardio: regular rate and rhythm, S1, S2 normal, no murmur, click, rub or gallop Extremities: extremities normal, atraumatic, no cyanosis or edema  Disposition: 01-Home or Self Care  Discharge Orders    Future Appointments: Provider: Department: Dept Phone: Center:   03/07/2012 9:45 AM Windell Hummingbird [facility] MEDICAL ONCOLOGY [phone] None   03/07/2012 10:15 AM Conni Slipper, PA [facility] MEDICAL ONCOLOGY [phone] None   03/07/2012 11:30 AM [facility] [phone] None   03/14/2012 11:00 AM [facility]COL[facility] [phone] None   04/04/2012 11:00 AM [facility] [phone] None   04/25/2012 11:00 AM [facility] [phone] None   05/02/2012 12:00 PM [facility] MEDICAL ONCOLOGY [phone] None   05/16/2012 11:00 AM [facility] MED[facility]COL[facility] [phone] None       Medication List     As of 03/03/2012 12:42 PM    STOP taking these  medications         dexamethasone 4 MG tablet   Commonly known as: DECADRON      pomalidomide 4 MG capsule   Commonly known as: POMALYST      TAKE these medications         amLODipine 5 MG tablet   Commonly  known as: NORVASC   Take 5 mg by mouth daily.      aspirin 81 MG tablet   Take 81 mg by mouth daily.      atenolol 100 MG tablet   Commonly known as: TENORMIN   Take 100 mg by mouth daily.      docusate sodium 100 MG capsule   Commonly known as: COLACE   Take 200-300 mg by mouth 2 (two) times daily. Takes 2 capsules(200mg ) every morning and 3 capsules(300mg ) every night      filgrastim 480 MCG/0.8ML Soln injection   Commonly known as: NEUPOGEN   Inject 0.8 mLs (480 mcg total) into the skin as directed.      insulin glargine 100 UNIT/ML injection   Commonly known as: LANTUS   Inject 64 Units into the skin at bedtime.      levofloxacin 750 MG tablet   Commonly known as: LEVAQUIN   Take 1 tablet (750 mg total) by mouth daily.      NOVOLOG MIX 70/30 (70-30) 100 UNIT/ML injection   Generic drug: insulin aspart protamine-insulin aspart   Inject 2-28 Units into the skin once a week. Per sliding scale.Marland KitchenMarland KitchenOnly takes morning after taking dexamethasone      oxyCODONE 40 MG 12 hr tablet   Commonly known as: OXYCONTIN   Take 1 tablet (40 mg total) by mouth every 12 (twelve) hours.      senna 8.6 MG tablet   Commonly known as: SENOKOT   Take 1 tablet by mouth daily.      simvastatin 10 MG tablet   Commonly known as: ZOCOR   Take 10 mg by mouth at bedtime.      Tamsulosin HCl 0.4 MG Caps   Commonly known as: FLOMAX   Take 1 capsule (0.4 mg total) by mouth daily after supper.      valsartan-hydrochlorothiazide 160-12.5 MG per tablet   Commonly known as: DIOVAN-HCT   Take 1 tablet by mouth daily.       35 minutes were spent in the discharge process of this patient, coordinating followup Reviewing data. SignedKaty Apo 03/03/2012, 12:42 PM

## 2012-03-07 ENCOUNTER — Ambulatory Visit: Payer: 59

## 2012-03-07 ENCOUNTER — Ambulatory Visit: Payer: Self-pay | Admitting: Physician Assistant

## 2012-03-07 ENCOUNTER — Other Ambulatory Visit: Payer: Self-pay | Admitting: Lab

## 2012-03-07 ENCOUNTER — Telehealth: Payer: Self-pay | Admitting: *Deleted

## 2012-03-07 ENCOUNTER — Telehealth: Payer: Self-pay | Admitting: Internal Medicine

## 2012-03-07 NOTE — Telephone Encounter (Signed)
Per 1/13 pof r/s 1/13 appts for lb/fu/tx. S/w pt today re appts lb/fu/tx 1/14 @ 2:15pm.

## 2012-03-07 NOTE — Telephone Encounter (Signed)
Per staff message and POF I have scheduled appts.  JMW  

## 2012-03-07 NOTE — Progress Notes (Signed)
Patient no showed.  Reached patient at home.  He thought his appointment was at 1130 - says he was given two different schedules.  Let patient know we would have to reschedule.  Sent message requesting new lab, MD and chemo appt.

## 2012-03-08 ENCOUNTER — Other Ambulatory Visit (HOSPITAL_BASED_OUTPATIENT_CLINIC_OR_DEPARTMENT_OTHER): Payer: 59 | Admitting: Lab

## 2012-03-08 ENCOUNTER — Ambulatory Visit (HOSPITAL_BASED_OUTPATIENT_CLINIC_OR_DEPARTMENT_OTHER): Payer: 59

## 2012-03-08 ENCOUNTER — Ambulatory Visit (HOSPITAL_BASED_OUTPATIENT_CLINIC_OR_DEPARTMENT_OTHER): Payer: 59 | Admitting: Physician Assistant

## 2012-03-08 ENCOUNTER — Telehealth: Payer: Self-pay | Admitting: Internal Medicine

## 2012-03-08 VITALS — BP 120/75 | HR 77 | Temp 98.5°F | Resp 20 | Ht 66.93 in | Wt 192.8 lb

## 2012-03-08 DIAGNOSIS — C9002 Multiple myeloma in relapse: Secondary | ICD-10-CM

## 2012-03-08 DIAGNOSIS — C9000 Multiple myeloma not having achieved remission: Secondary | ICD-10-CM

## 2012-03-08 DIAGNOSIS — M7989 Other specified soft tissue disorders: Secondary | ICD-10-CM

## 2012-03-08 LAB — COMPREHENSIVE METABOLIC PANEL (CC13)
ALT: <6 U/L (ref 0–55)
AST: 16 U/L (ref 5–34)
Albumin: 2.4 g/dL — ABNORMAL LOW (ref 3.5–5.0)
Alkaline Phosphatase: 100 U/L (ref 40–150)
Glucose: 69 mg/dl — ABNORMAL LOW (ref 70–99)
Potassium: 4.3 mEq/L (ref 3.5–5.1)
Sodium: 137 mEq/L (ref 136–145)
Total Protein: 6.7 g/dL (ref 6.4–8.3)

## 2012-03-08 LAB — CBC WITH DIFFERENTIAL/PLATELET
BASO%: 1.4 % (ref 0.0–2.0)
Basophils Absolute: 0.1 10*3/uL (ref 0.0–0.1)
EOS%: 0.9 % (ref 0.0–7.0)
HGB: 11.3 g/dL — ABNORMAL LOW (ref 13.0–17.1)
MCH: 29.1 pg (ref 27.2–33.4)
NEUT#: 2.5 10*3/uL (ref 1.5–6.5)
RBC: 3.88 10*6/uL — ABNORMAL LOW (ref 4.20–5.82)
RDW: 14.6 % (ref 11.0–14.6)
lymph#: 0.6 10*3/uL — ABNORMAL LOW (ref 0.9–3.3)
nRBC: 0 % (ref 0–0)

## 2012-03-08 MED ORDER — ZOLEDRONIC ACID 4 MG/100ML IV SOLN
4.0000 mg | Freq: Once | INTRAVENOUS | Status: AC
  Administered 2012-03-08: 4 mg via INTRAVENOUS
  Filled 2012-03-08: qty 100

## 2012-03-08 MED ORDER — SODIUM CHLORIDE 0.9 % IV SOLN
Freq: Once | INTRAVENOUS | Status: AC
  Administered 2012-03-08: 16:00:00 via INTRAVENOUS

## 2012-03-08 NOTE — Patient Instructions (Signed)
[facility] Discharge Instructions for Patients Receiving Chemotherapy  Today you received the following chemotherapy agents: zometa   To help prevent nausea and vomiting after your treatment, we encourage you to take your nausea medication.   Take it as often as prescribed.     If you develop nausea and vomiting that is not controlled by your nausea medication, call the clinic. If it is after clinic hours your family physician or the after hours number for the clinic or go to the Emergency Department.   BELOW ARE SYMPTOMS THAT SHOULD BE REPORTED IMMEDIATELY:  *FEVER GREATER THAN 100.5 F  *CHILLS WITH OR WITHOUT FEVER  NAUSEA AND VOMITING THAT IS NOT CONTROLLED WITH YOUR NAUSEA MEDICATION  *UNUSUAL SHORTNESS OF BREATH  *UNUSUAL BRUISING OR BLEEDING  TENDERNESS IN MOUTH AND THROAT WITH OR WITHOUT PRESENCE OF ULCERS  *URINARY PROBLEMS  *BOWEL PROBLEMS  UNUSUAL RASH Items with * indicate a potential emergency and should be followed up as soon as possible.  Feel free to call the clinic you have any questions or concerns. The clinic phone number is [phone].   I have been informed and understand all the instructions given to me. I know to contact the clinic, my physician, or go to the Emergency Department if any problems should occur. I do not have any questions at this time, but understand that I may call the clinic during office hours   should I have any questions or need assistance in obtaining follow up care.    __________________________________________  _____________  __________ Signature of Patient or Authorized Representative            Date                   Time    __________________________________________ Nurse's Signature

## 2012-03-08 NOTE — Patient Instructions (Addendum)
Complete your current supply of Pomalyst as instructed Return in 2 weeks for lab studies, including  Repeat protein studies to re-evaluate your disease Follow up with Dr. Arbutus Ped in 3 weeks You will be scheduled for lower extremity dopplers to evaluate your leg swelling for possible " blood clot"

## 2012-03-08 NOTE — Telephone Encounter (Signed)
gv and printed appt schedule for pt for Jan...emailed michelle to add tx will call pt with d.t

## 2012-03-09 ENCOUNTER — Telehealth: Payer: Self-pay | Admitting: *Deleted

## 2012-03-09 ENCOUNTER — Other Ambulatory Visit: Payer: Self-pay | Admitting: *Deleted

## 2012-03-09 DIAGNOSIS — R52 Pain, unspecified: Secondary | ICD-10-CM

## 2012-03-09 DIAGNOSIS — C9000 Multiple myeloma not having achieved remission: Secondary | ICD-10-CM

## 2012-03-09 MED ORDER — OXYCODONE HCL 40 MG PO TB12: 40.0000 mg | ORAL_TABLET | Freq: Two times a day (BID) | ORAL | Status: DC

## 2012-03-09 NOTE — Telephone Encounter (Signed)
Pt's wife Hope called stating that he has 8 tablets left.  Per Tiana Loft, pt is to finish the 8 tablets he has at home.  Will reassess whether to refill rx at f/u appt scheduled 1/29.  Hope verbalized understanding,  SLJ

## 2012-03-09 NOTE — Telephone Encounter (Signed)
Rec'd fax to refill Pomalyst, however patient is due to complete current cycle then return for re-staging labs. Faxed back to [employer] to hold for now, we will send in new Rx after office visit on 03/23/2012.

## 2012-03-09 NOTE — Telephone Encounter (Signed)
Per staff message and POF I have scheduled appts.  JMW  

## 2012-03-09 NOTE — Telephone Encounter (Signed)
Ok to refill it. 

## 2012-03-10 NOTE — Progress Notes (Signed)
[facility] [phone]   [phone]  OFFICE PROGRESS NOTE  Katy Apo, MD [address] [place] [address]  DIAGNOSIS:  Multiple myeloma IgA subtype diagnosed in November, 2009.   PRIOR THERAPY:  1. Status post seven cycles of systemic chemotherapy with Revlimid and low-dose Betatron with partial response. The last dose was given June, 2010. 2. Status post chemotherapy with weekly Velcade in a patient on Decadron 40 mg on a weekly basis, status post 75 weekly doses. The last dose was given May 04, 2008. 3. Status post treatment again with Velcade and Decadron. The patient was awaiting stem cell transplant at [facility]. Last dose was given Jul 16, 2009. 4. The patient was not considered a good candidate for autologous stem cell transplant at that time secondary to poor mobilization of his stem cells.  5. Status post six cycles of systemic chemotherapy with Velcade, Cytoxan and Betatron. Last dose was given March 25, 2010. This continued secondary to his disease progression. 6. Status post ten cycles of systemic chemotherapy with Velcade. Doxil and Decadron. Last dose was given on 10/31/2010. Currently on hold because of concern about cardiac toxicity.  CURRENT THERAPY:  1. Pomalyst 4 mg by mouth daily for 21 days every 28 days, completed cycle #8 and approximately 2/3 of cycle #9 2. Decadron 40 mg by mouth weekly 3. Zometa 4 mg IV given every 2 months  INTERVAL HISTORY: Trexton Escamilla Taher 67 y.o. male returns to the clinic today for routine followup visit after being hospitalized for pneumonia from January 2 through 03/03/2012. He completed his antibiotic course of Levaquin as prescribed. He is due for his Zometa infusion today. Since being discharged from the hospital he's noted some swelling in his right foot and calf area. He states that there is some mild discomfort. The swelling tends to get a little  better with elevation. He has some pain in the back of the knee if he crosses his legs. He reports that his palmar list was held while he was in the hospital and he was approximately week shy of completing cycle #9. He denied any further cough but continues to have shortness of breath. He is on home oxygen now at 2 L per minute via nasal cannula. He denied fever or chills. The patient has been tolerating his current treatment with Pomalyst and Decadron fairly well with no significant adverse effects except for occasional aching pain after the Neupogen injection which he had recently secondary to significant neutropenia. He denied having any significant weight loss or night sweats. He denied having any chest pain, cough or hemoptysis. No bleeding issues.    MEDICAL HISTORY: Past Medical History  Diagnosis Date  . Allergy   . Diabetes mellitus   . Hypertension   . Hepatitis C   . Multiple myeloma(203.0)   . Multiple myeloma   . Chronic kidney disease     ALLERGIES:  is allergic to ace inhibitors.  MEDICATIONS:  Current Outpatient Prescriptions  Medication Sig Dispense Refill  . ALPRAZolam (XANAX) 0.5 MG tablet       . amLODipine (NORVASC) 5 MG tablet Take 5 mg by mouth daily.        Marland Kitchen aspirin 81 MG tablet Take 81 mg by mouth daily.      Marland Kitchen atenolol (TENORMIN) 100 MG tablet Take 100 mg by mouth daily.        Marland Kitchen dexamethasone (DECADRON) 4 MG tablet       .  docusate sodium (COLACE) 100 MG capsule Take 200-300 mg by mouth 2 (two) times daily. Takes 2 capsules(200mg ) every morning and 3 capsules(300mg ) every night      . filgrastim (NEUPOGEN) 480 MCG/0.8ML SOLN injection Inject 0.8 mLs (480 mcg total) into the skin as directed.  10 Syringe  1  . HYDROmorphone (DILAUDID) 2 MG tablet       . insulin glargine (LANTUS) 100 UNIT/ML injection Inject 64 Units into the skin at bedtime.       Marland Kitchen NOVOLOG MIX 70/30 (70-30) 100 UNIT/ML injection Inject 2-28 Units into the skin once a week. Per sliding  scale.Marland KitchenMarland KitchenOnly takes morning after taking dexamethasone      . oxyCODONE (OXYCONTIN) 40 MG 12 hr tablet Take 1 tablet (40 mg total) by mouth every 12 (twelve) hours.  60 tablet  0  . POMALYST 4 MG capsule       . senna (SENOKOT) 8.6 MG tablet Take 1 tablet by mouth daily.      . simvastatin (ZOCOR) 10 MG tablet Take 10 mg by mouth at bedtime.        . Tamsulosin HCl (FLOMAX) 0.4 MG CAPS Take 1 capsule (0.4 mg total) by mouth daily after supper.  30 capsule  2  . valsartan-hydrochlorothiazide (DIOVAN-HCT) 160-12.5 MG per tablet Take 1 tablet by mouth daily.          SURGICAL HISTORY:  Past Surgical History  Procedure Date  . Circumcision   . Hand surgery     REVIEW OF SYSTEMS:  Pertinent items are noted in HPI.   PHYSICAL EXAMINATION: General appearance: alert, cooperative, no distress and Wearing oxygen via nasal cannula at 2 L per minute, breathing comfortably. Head: Normocephalic, without obvious abnormality, atraumatic Neck: no adenopathy Resp: clear to auscultation bilaterally Cardio: regular rate and rhythm, S1, S2 normal, no murmur, click, rub or gallop GI: soft, non-tender; bowel sounds normal; no masses,  no organomegaly Extremities: edema 2+ pitting edema right lower extremity from just below the knee extending to the foot with trace edema on the left in the same distribution. There is no erythema or warmth no specific areas of point tenderness Neurologic: Alert and oriented X 3, normal strength and tone. Normal symmetric reflexes. Normal coordination and gait  ECOG PERFORMANCE STATUS: 1 - Symptomatic but completely ambulatory  Blood pressure 120/75, pulse 77, temperature 98.5 F (36.9 C), temperature source Oral, resp. rate 20, height 5' 6.93" (1.7 m), weight 192 lb 12.8 oz (87.454 kg), SpO2 89.00%.  LABORATORY DATA: Lab Results  Component Value Date   WBC 3.5* 03/08/2012   HGB 11.3* 03/08/2012   HCT 34.8* 03/08/2012   MCV 89.7 03/08/2012   PLT 377 03/08/2012       Chemistry      Component Value Date/Time   NA 137 03/08/2012 1422   NA 138 02/29/2012 0436   K 4.3 03/08/2012 1422   K 3.9 02/29/2012 0436   CL 105 03/08/2012 1422   CL 104 02/29/2012 0436   CO2 23 03/08/2012 1422   CO2 25 02/29/2012 0436   BUN 34.0* 03/08/2012 1422   BUN 17 02/29/2012 0436   CREATININE 1.7* 03/08/2012 1422   CREATININE 1.13 02/29/2012 0436   GLU 293* 08/12/2009 1322      Component Value Date/Time   CALCIUM 9.2 03/08/2012 1422   CALCIUM 9.3 02/29/2012 0436   ALKPHOS 100 03/08/2012 1422   ALKPHOS 118* 02/26/2012 0520   AST 16 03/08/2012 1422   AST 17 02/26/2012 0520  ALT <6 Repeated and Verified 03/08/2012 1422   ALT 9 02/26/2012 0520   BILITOT 0.53 03/08/2012 1422   BILITOT 0.5 02/26/2012 0520     Myeloma panel: Beta-2 microglobulin 4.58, free kappa light chain 2. 53, free lambda light chain 28.90, kappa/lambda ratio 0.09, IgG 768, IgA 437, and IgM 30  RADIOGRAPHIC STUDIES: No results found.  ASSESSMENT/PLAN: This is a very pleasant 67 years old [place] American male with history of multiple myeloma currently on treatment with Pomalyst and Decadron status post 8 cycles. He is tolerating his treatment fairly well and he has no evidence for disease progression on his recent blood work. The patient was discussed with Dr. Arbutus Ped. The patient is asked to complete his remaining palmar list tablets. He will then followup in 2 weeks with laboratory studies including repeat protein studies to reevaluate his disease including quantitative immunoglobulin, beta 2 microglobulin and serum light chains, as well as a CBC differential, C. met and LDH. His right lower extremity swelling is concerning and he will be scheduled to have bilateral lower extremity Dopplers to be evaluated for possible deep vein thrombosis. Should the study be positive for DVT, appropriate anticoagulation therapy will be instituted. He'll followup with Dr. Arbutus Ped in 3 weeks to review the results of his protein studies and discuss  further treatment.   Laural Benes, Morrison Masser E, PA-C   All questions were answered. The patient knows to call the clinic with any problems, questions or concerns. We can certainly see the patient much sooner if necessary.  I spent 20 minutes counseling the patient face to face. The total time spent in the appointment was 30 minutes.

## 2012-03-11 ENCOUNTER — Other Ambulatory Visit: Payer: Self-pay | Admitting: Pharmacist

## 2012-03-11 ENCOUNTER — Encounter: Payer: Self-pay | Admitting: Physician Assistant

## 2012-03-11 ENCOUNTER — Encounter: Payer: Self-pay | Admitting: Pharmacist

## 2012-03-11 ENCOUNTER — Other Ambulatory Visit: Payer: Self-pay | Admitting: *Deleted

## 2012-03-11 ENCOUNTER — Other Ambulatory Visit: Payer: Self-pay | Admitting: Physician Assistant

## 2012-03-11 ENCOUNTER — Telehealth: Payer: Self-pay | Admitting: Internal Medicine

## 2012-03-11 ENCOUNTER — Ambulatory Visit (HOSPITAL_COMMUNITY)
Admission: RE | Admit: 2012-03-11 | Discharge: 2012-03-11 | Disposition: A | Discharge status: Home / Self Care | Payer: 59 | Source: Ambulatory Visit | Attending: Internal Medicine | Admitting: Internal Medicine

## 2012-03-11 DIAGNOSIS — M7989 Other specified soft tissue disorders: Secondary | ICD-10-CM

## 2012-03-11 DIAGNOSIS — I82409 Acute embolism and thrombosis of unspecified deep veins of unspecified lower extremity: Secondary | ICD-10-CM

## 2012-03-11 DIAGNOSIS — I824Y9 Acute embolism and thrombosis of unspecified deep veins of unspecified proximal lower extremity: Secondary | ICD-10-CM | POA: Insufficient documentation

## 2012-03-11 DIAGNOSIS — I743 Embolism and thrombosis of arteries of the lower extremities: Secondary | ICD-10-CM | POA: Insufficient documentation

## 2012-03-11 DIAGNOSIS — C9002 Multiple myeloma in relapse: Secondary | ICD-10-CM

## 2012-03-11 DIAGNOSIS — M79609 Pain in unspecified limb: Secondary | ICD-10-CM

## 2012-03-11 MED ORDER — WARFARIN SODIUM 5 MG PO TABS: 5.0000 mg | ORAL_TABLET | Freq: Every day | ORAL | Status: DC

## 2012-03-11 NOTE — Progress Notes (Signed)
In view of the patient's bilateral lower extremity deep vein thromboses, patient was asked to hold his Pomalyst over the weekend until this can be discussed with Dr. Arbutus Ped - if he should complete the final remaining fibers 6 days of the current cycle.  Laural Benes, Pierina Schuknecht E, PA-C

## 2012-03-11 NOTE — Telephone Encounter (Signed)
appts made and printed for pt aom °

## 2012-03-11 NOTE — Progress Notes (Addendum)
*  PRELIMINARY RESULTS* Vascular Ultrasound Lower extremity venous duplex has been completed.  Preliminary findings: Right = DVT involving the popliteal and peroneal veins. Left=  DVT involving a single peroneal vein in the mid calf.  Called report to Judeth Cornfield, RN at Dr. Asa Lente office.  Farrel Demark, RDMS, RVT 03/11/2012, 11:41 AM

## 2012-03-11 NOTE — Progress Notes (Signed)
Consult to [facility] Coumadin clinic by Tiana Loft, PA w/ newly dx R LE DVT Goal INR: 2-3 Duration: *to be determined by MD* Risk factors for bleeding: Pomolyst, myeloma, renal impairment (CrCl = 54 mL/min most recently) Wt = 87 kg Pt will begin Arixtra 7.5 mg/day SQ today & Coumadin 5 mg/day Return 03/16/11 for INR & 1st CC visit Plan developed w/ Adrena. She will d/w pt. Marily Lente, PharmD

## 2012-03-11 NOTE — Progress Notes (Signed)
Patient had bilateral lower extremity Dopplers performed today revealing from the pulmonary results the right DVT involving the popliteal and peroneal veins in the left DVT involving a single peroneal vein in the mid calf. Patient reports at our office or in consultation with our Coumadin clinic pharmacist patient was started on Arixtra 7.5 mg subcutaneously daily as well as Coumadin 5 mg by mouth daily. He will followup with a PT/INR and Coumadin clinic appointment on 03/15/2012 at which time he will have his all his other labs that were scheduled for 03/14/2012 drawn as well. Both patient and his wife voiced understanding. He was given for Arixtra 7.5 mg ranges as well as a prescription for Coumadin 5 mg tablets a total of 60 tablets with no refill.  Laural Benes, Steen Bisig E, PA-C

## 2012-03-12 NOTE — Progress Notes (Signed)
6 months

## 2012-03-14 ENCOUNTER — Other Ambulatory Visit: Payer: 59 | Admitting: Lab

## 2012-03-14 NOTE — Progress Notes (Addendum)
Per Dr Arbutus Ped, okay to continue to hold Pomalyst until pt is seen again in the office 03/23/12.  Pt's wife Hope verbalized understanding to hold Pomalyst.  SLJ

## 2012-03-15 ENCOUNTER — Other Ambulatory Visit (HOSPITAL_BASED_OUTPATIENT_CLINIC_OR_DEPARTMENT_OTHER): Payer: 59 | Admitting: Lab

## 2012-03-15 ENCOUNTER — Ambulatory Visit (HOSPITAL_BASED_OUTPATIENT_CLINIC_OR_DEPARTMENT_OTHER): Payer: 59 | Admitting: Pharmacist

## 2012-03-15 DIAGNOSIS — I82409 Acute embolism and thrombosis of unspecified deep veins of unspecified lower extremity: Secondary | ICD-10-CM

## 2012-03-15 DIAGNOSIS — C9002 Multiple myeloma in relapse: Secondary | ICD-10-CM

## 2012-03-15 LAB — PROTIME-INR
INR: 1.3 — ABNORMAL LOW (ref 2.00–3.50)
Protime: 15.6 Seconds — ABNORMAL HIGH (ref 10.6–13.4)

## 2012-03-15 LAB — COMPREHENSIVE METABOLIC PANEL (CC13)
Albumin: 2.7 g/dL — ABNORMAL LOW (ref 3.5–5.0)
Alkaline Phosphatase: 115 U/L (ref 40–150)
BUN: 31 mg/dL — ABNORMAL HIGH (ref 7.0–26.0)
Creatinine: 1.6 mg/dL — ABNORMAL HIGH (ref 0.7–1.3)
Glucose: 118 mg/dl — ABNORMAL HIGH (ref 70–99)
Potassium: 4.2 mEq/L (ref 3.5–5.1)
Total Bilirubin: 0.42 mg/dL (ref 0.20–1.20)

## 2012-03-15 LAB — CBC WITH DIFFERENTIAL/PLATELET
Basophils Absolute: 0.1 10*3/uL (ref 0.0–0.1)
Eosinophils Absolute: 0.2 10*3/uL (ref 0.0–0.5)
HCT: 31.5 % — ABNORMAL LOW (ref 38.4–49.9)
HGB: 10.6 g/dL — ABNORMAL LOW (ref 13.0–17.1)
LYMPH%: 10.3 % — ABNORMAL LOW (ref 14.0–49.0)
MCV: 88.8 fL (ref 79.3–98.0)
MONO%: 7.7 % (ref 0.0–14.0)
NEUT#: 2.8 10*3/uL (ref 1.5–6.5)
NEUT%: 75.3 % — ABNORMAL HIGH (ref 39.0–75.0)
Platelets: 462 10*3/uL — ABNORMAL HIGH (ref 140–400)

## 2012-03-15 NOTE — Progress Notes (Signed)
INR = 1.3 Pt started on Coumadin 5 mg/day + Arixtra 7.5 mg/day (SQ) this past Friday (1/17) for DVT. Pt is here today w/ his wife who is a Water quality scientist for [employer]; she works weekend-option at [employer]. Diet consists of 1 green vegetable/day.  He eats tossed salads 2-4 times/week; often eats raw spinach. Occasional EtOH; 1 beer every now & then. Pt is currently on Levaquin 500 mg daily (by PCP).  Finishes that on 03/18/12 A full discussion of the nature of anticoagulants has been carried out.  A benefit risk analysis has been presented to the patient, so that he & his wife understand the justification for choosing anticoagulation at this time. The need for frequent and regular monitoring, precise dosage adjustment and compliance is stressed.  Side effects of potential bleeding are discussed.  The patient should avoid any OTC items containing aspirin or ibuprofen, and should avoid great swings in general diet.  Avoid alcohol consumption.  Call if any signs of abnormal bleeding.   I gave pts wife a thorough handout on vit K content & diet.  She is the one who cooks at their house. Pt will take Coumadin 7.5 mg x 1 today then go back to 5 mg/day (since he is on Levaquin--once that stops, we may need to increase his Coumadin dose a little next week).  We need to keep in mind his moderate vit K intake as well. Stay on Arixtra 7.5 mg/day sq.  I provided pt with 6 more syringes. I have discussed plan w/ Dr. Arbutus Ped. Next PT/INR test on 03/21/12.  Pt sees Dr. Arbutus Ped on 1/29 in case we need to follow up that day as well. Marily Lente, Pharm.D.

## 2012-03-16 ENCOUNTER — Telehealth: Payer: Self-pay | Admitting: Pharmacist

## 2012-03-17 LAB — SPEP & IFE WITH QIG
IgA: 411 mg/dL — ABNORMAL HIGH (ref 68–379)
IgG (Immunoglobin G), Serum: 796 mg/dL (ref 650–1600)
IgM, Serum: 19 mg/dL — ABNORMAL LOW (ref 41–251)
M-Spike, %: 0.19 g/dL
Total Protein, Serum Electrophoresis: 6.4 g/dL (ref 6.0–8.3)

## 2012-03-21 ENCOUNTER — Other Ambulatory Visit (HOSPITAL_BASED_OUTPATIENT_CLINIC_OR_DEPARTMENT_OTHER): Payer: 59 | Admitting: Lab

## 2012-03-21 ENCOUNTER — Ambulatory Visit (HOSPITAL_BASED_OUTPATIENT_CLINIC_OR_DEPARTMENT_OTHER): Payer: 59 | Admitting: Pharmacist

## 2012-03-21 DIAGNOSIS — Z7901 Long term (current) use of anticoagulants: Secondary | ICD-10-CM

## 2012-03-21 DIAGNOSIS — I82409 Acute embolism and thrombosis of unspecified deep veins of unspecified lower extremity: Secondary | ICD-10-CM

## 2012-03-21 LAB — PROTIME-INR: INR: 1.7 — ABNORMAL LOW (ref 2.00–3.50)

## 2012-03-21 NOTE — Progress Notes (Signed)
INR = 1.7 after increasing dose of Coumadin to 7.5 mg x 1 dose then 5 mg/day On Arixtra 7.5 mg daily until INR at goal. Pts wife, Hope states pt has small bruises on abdomen from injections of Arixtra. No bleeding. Pt & his wife have developed a diet plan after reviewing information I provided last week re: vit K.  The pt will plan to eat 1-2 small spinach salads weekly.  He had one over the weekend, in fact. Pt will remain on "baby ASA" while on Coumadin. INR subtherapeutic but nearing goal.  Will get pt to take 7.5 mg x 2 doses then back for INR on 1/29 w/ MD visit same day. Hopefully by Wed we'll be able to stop his Arixtra. Marily Lente, Pharm.D.

## 2012-03-23 ENCOUNTER — Ambulatory Visit: Payer: 59 | Admitting: Pharmacist

## 2012-03-23 ENCOUNTER — Other Ambulatory Visit: Payer: Self-pay | Admitting: Medical Oncology

## 2012-03-23 ENCOUNTER — Ambulatory Visit (HOSPITAL_BASED_OUTPATIENT_CLINIC_OR_DEPARTMENT_OTHER): Payer: 59 | Admitting: Internal Medicine

## 2012-03-23 ENCOUNTER — Other Ambulatory Visit (HOSPITAL_BASED_OUTPATIENT_CLINIC_OR_DEPARTMENT_OTHER): Payer: 59 | Admitting: Lab

## 2012-03-23 ENCOUNTER — Encounter: Payer: Self-pay | Admitting: Internal Medicine

## 2012-03-23 VITALS — BP 120/72 | HR 74 | Temp 97.7°F | Resp 20 | Ht 67.0 in | Wt 192.1 lb

## 2012-03-23 DIAGNOSIS — I82409 Acute embolism and thrombosis of unspecified deep veins of unspecified lower extremity: Secondary | ICD-10-CM

## 2012-03-23 DIAGNOSIS — C9000 Multiple myeloma not having achieved remission: Secondary | ICD-10-CM

## 2012-03-23 LAB — PROTIME-INR
INR: 2.1 (ref 2.00–3.50)
Protime: 25.2 Seconds — ABNORMAL HIGH (ref 10.6–13.4)

## 2012-03-23 LAB — POCT INR: INR: 2.1

## 2012-03-23 MED ORDER — DEXAMETHASONE 4 MG PO TABS: 4.0000 mg | ORAL_TABLET | ORAL | Status: DC

## 2012-03-23 NOTE — Telephone Encounter (Signed)
[facility] is now called [facility] for oncology specialty meds for [place].  However use  [facility] for non specialty meds.  I called in Decadron refill to [facility] [phone].

## 2012-03-23 NOTE — Progress Notes (Signed)
[facility] [phone]   [phone]  OFFICE PROGRESS NOTE  Katy Apo, MD [address] [place] [address]  DIAGNOSIS:  Multiple myeloma IgA subtype diagnosed in November, 2009.   PRIOR THERAPY:  1. Status post seven cycles of systemic chemotherapy with Revlimid and low-dose Betatron with partial response. The last dose was given June, 2010. 2. Status post chemotherapy with weekly Velcade in a patient on Decadron 40 mg on a weekly basis, status post 75 weekly doses. The last dose was given May 04, 2008. 3. Status post treatment again with Velcade and Decadron. The patient was awaiting stem cell transplant at [facility]. Last dose was given Jul 16, 2009. 4. The patient was not considered a good candidate for autologous stem cell transplant at that time secondary to poor mobilization of his stem cells.  5. Status post six cycles of systemic chemotherapy with Velcade, Cytoxan and Betatron. Last dose was given March 25, 2010. This continued secondary to his disease progression. 6. Status post ten cycles of systemic chemotherapy with Velcade. Doxil and Decadron. Last dose was given on 10/31/2010. Currently on hold because of concern about cardiac toxicity.  CURRENT THERAPY:  1. Pomalyst 4 mg by mouth daily for 21 days every 28 days, completed cycle #7. 2. Decadron 40 mg by mouth weekly  INTERVAL HISTORY: Rodney Green 67 y.o. male returns to the clinic today for followup visit accompanied his wife. The patient is feeling fine today with no specific complaints except for occasional shortness of breath. He was recently admitted to [facility] on 02/25/2012 and discharged on 03/03/2012 for treatment of acute respiratory failure secondary to pneumonia. He was treated with broad-spectrum antibiotics and CT scan of the chest at that time confirmed the presence of multilobar pneumonia. He also received few  days of Neupogen secondary to neutropenia. He has been doing fine since discharge and he was home oxygen only at nighttime or with exertion. He denied having any significant chest pain, cough or hemoptysis. He denied having any significant weight loss or night sweats. He had repeat myeloma panel performed recently and he is here today for evaluation and discussion of his lab results. The patient has been off treatment with Pomalyst for the last few weeks.   MEDICAL HISTORY: Past Medical History  Diagnosis Date  . Allergy   . Diabetes mellitus   . Hypertension   . Hepatitis C   . Multiple myeloma(203.0)   . Multiple myeloma   . Chronic kidney disease     ALLERGIES:  is allergic to ace inhibitors.  MEDICATIONS:  Current Outpatient Prescriptions  Medication Sig Dispense Refill  . amLODipine (NORVASC) 5 MG tablet Take 5 mg by mouth daily.        Marland Kitchen aspirin 81 MG tablet Take 81 mg by mouth daily.      Marland Kitchen atenolol (TENORMIN) 100 MG tablet Take 100 mg by mouth daily.        Marland Kitchen dexamethasone (DECADRON) 4 MG tablet       . docusate sodium (COLACE) 100 MG capsule Take 200-300 mg by mouth 2 (two) times daily. Takes 2 capsules(200mg ) every morning and 3 capsules(300mg ) every night      . HYDROmorphone (DILAUDID) 2 MG tablet       . insulin glargine (LANTUS) 100 UNIT/ML injection Inject 64 Units into the skin at bedtime.       . mineral oil external liquid  by Does not apply route every other day.      Marland Kitchen NOVOLOG MIX 70/30 (70-30) 100 UNIT/ML injection Inject 2-28 Units into the skin once a week. Per sliding scale.Marland KitchenMarland KitchenOnly takes morning after taking dexamethasone      . oxyCODONE (OXYCONTIN) 40 MG 12 hr tablet Take 1 tablet (40 mg total) by mouth every 12 (twelve) hours.  60 tablet  0  . senna (SENOKOT) 8.6 MG tablet Take 1 tablet by mouth daily.      . simvastatin (ZOCOR) 10 MG tablet Take 10 mg by mouth at bedtime.        . Tamsulosin HCl (FLOMAX) 0.4 MG CAPS Take 1 capsule (0.4 mg total) by mouth daily  after supper.  30 capsule  2  . valsartan-hydrochlorothiazide (DIOVAN-HCT) 160-12.5 MG per tablet Take 1 tablet by mouth daily.        Marland Kitchen warfarin (COUMADIN) 5 MG tablet Take 1 tablet (5 mg total) by mouth daily. Take 5mg  daily or as directed.  50 tablet  0  . filgrastim (NEUPOGEN) 480 MCG/0.8ML SOLN injection Inject 0.8 mLs (480 mcg total) into the skin as directed.  10 Syringe  1  . POMALYST 4 MG capsule         SURGICAL HISTORY:  Past Surgical History  Procedure Date  . Circumcision   . Hand surgery     REVIEW OF SYSTEMS:  A comprehensive review of systems was negative except for: Constitutional: positive for fatigue Respiratory: positive for dyspnea on exertion   PHYSICAL EXAMINATION: General appearance: alert, cooperative and no distress Head: Normocephalic, without obvious abnormality, atraumatic Neck: no adenopathy Lymph nodes: Cervical, supraclavicular, and axillary nodes normal. Resp: clear to auscultation bilaterally Cardio: regular rate and rhythm, S1, S2 normal, no murmur, click, rub or gallop GI: soft, non-tender; bowel sounds normal; no masses,  no organomegaly Extremities: extremities normal, atraumatic, no cyanosis or edema Neurologic: Alert and oriented X 3, normal strength and tone. Normal symmetric reflexes. Normal coordination and gait  ECOG PERFORMANCE STATUS: 1 - Symptomatic but completely ambulatory  Blood pressure 120/72, pulse 74, temperature 97.7 F (36.5 C), temperature source Oral, resp. rate 20, height 5\' 7"  (1.702 m), weight 192 lb 1.6 oz (87.136 kg).  LABORATORY DATA: Lab Results  Component Value Date   WBC 3.7* 03/15/2012   HGB 10.6* 03/15/2012   HCT 31.5* 03/15/2012   MCV 88.8 03/15/2012   PLT 462 Few Large platelets present* 03/15/2012      Chemistry      Component Value Date/Time   NA 142 03/15/2012 1106   NA 138 02/29/2012 0436   K 4.2 03/15/2012 1106   K 3.9 02/29/2012 0436   CL 109* 03/15/2012 1106   CL 104 02/29/2012 0436   CO2 24 03/15/2012  1106   CO2 25 02/29/2012 0436   BUN 31.0* 03/15/2012 1106   BUN 17 02/29/2012 0436   CREATININE 1.6* 03/15/2012 1106   CREATININE 1.13 02/29/2012 0436   GLU 293* 08/12/2009 1322      Component Value Date/Time   CALCIUM 8.6 03/15/2012 1106   CALCIUM 9.3 02/29/2012 0436   ALKPHOS 115 03/15/2012 1106   ALKPHOS 118* 02/26/2012 0520   AST 16 03/15/2012 1106   AST 17 02/26/2012 0520   ALT 11 03/15/2012 1106   ALT 9 02/26/2012 0520   BILITOT 0.42 03/15/2012 1106   BILITOT 0.5 02/26/2012 0520       RADIOGRAPHIC STUDIES: Dg Chest 2 View  02/25/2012  *RADIOLOGY REPORT*  Clinical Data:  Short of breath  CHEST - 2 VIEW  Comparison: 01/27/2012  Findings: Low lung volumes and bibasilar scarring is again demonstrated.  Normal heart size.  No effusion or edema.  No airspace consolidation.  IMPRESSION:  1.  Chronic low lung volumes with bibasilar atelectasis.   Original Report Authenticated By: Signa Kell, M.D.    Ct Chest Wo Contrast  02/25/2012  *RADIOLOGY REPORT*  Clinical Data: Hypoxia; shortness of breath.  Acute respiratory failure.  CT CHEST WITHOUT CONTRAST  Technique:  Multidetector CT imaging of the chest was performed following the standard protocol without IV contrast.  Comparison: CT of the chest performed 10/24/2011  Findings: Patchy bilateral airspace opacification is noted, favoring the upper lung lobes, though also seen within both lower lobes.  Focal density at the right lung base is thought to reflect scarring.  Previously noted nodularity at the left upper lobe has resolved, and was likely infectious in nature.  No pleural effusion or pneumothorax is seen.  Diffuse coronary artery calcifications are seen.  The mediastinum is otherwise unremarkable in appearance.  Scattered mediastinal nodes remain normal in size.  No pericardial effusion is identified.  Mild calcific atherosclerotic disease is noted along the proximal great vessels; the great vessels are otherwise grossly unremarkable.  The thyroid gland is  unremarkable in appearance.  No axillary lymphadenopathy is seen.  The visualized portions of the liver and spleen are unremarkable in appearance.  Mildly increased attenuation layering within the gallbladder may reflect sludge or tiny stones.  Nonspecific perinephric stranding is again noted bilaterally.  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  No acute osseous abnormalities are identified.  As noted on the prior study, there are compression deformities involving T5-L1, and changes of vertebroplasty are noted at L1.  These appear grossly stable from the prior study.  IMPRESSION:  1.  Patchy bilateral airspace opacification, favoring the upper lung lobes, compatible with multifocal pneumonia. 2.  Focal density at the right lung base is thought to reflect scarring; the previously noted nodule at the left upper lobe has resolved and was likely infectious in nature. 3.  Diffuse coronary artery calcifications seen. 4.  Mildly increased attenuation layering within the gallbladder may reflect sludge or tiny stones. 5.  Extensive chronic compression deformities along the thoracic and upper lumbar spine, unchanged from the prior CT.   Original Report Authenticated By: Tonia Ghent, M.D.    Nm Pulmonary Perf And Vent  02/25/2012  *RADIOLOGY REPORT*  Clinical Data: Short of breath  NM PULMONARY VENTILATION AND PERFUSION SCAN  Radiopharmaceutical: 5.7MILLI CURIE MAA TECHNETIUM TO 65M ALBUMIN AGGREGATED  Comparison: 02/25/2012  Findings: Exam detail diminish due to low lung volumes.  The chest radiograph from today the lungs appear hypoinflated.  Within the right lung base there is a moderate sized perfusion defect with corresponding ventilation abnormality.  No unmatched segmental perfusion defects identified.  IMPRESSION:  1. Diminished exam detail due to low lung volumes.  Low probability for acute pulmonary embolus.   Original Report Authenticated By:  Signa Kell, M.D.    Dg Chest Port 1 View  02/29/2012  *RADIOLOGY REPORT*  Clinical Data: Pneumonia.  PORTABLE CHEST - 1 VIEW  Comparison: Plain film of the chest 02/25/2012 and 02/27/2012. CT chest 02/25/2012.  Findings: Patchy bilateral airspace disease persists but has improved since the most recent plain film.  No pneumothorax is identified.  Heart size is normal.  There is likely a trace right pleural  effusion.  IMPRESSION: Some improvement in bilateral airspace disease.   Original Report Authenticated By: Holley Dexter, M.D.    Dg Chest Port 1 View  02/27/2012  *RADIOLOGY REPORT*  Clinical Data: Pneumonia.  Acute respiratory failure with hypoxia. Neutropenia.  PORTABLE CHEST - 1 VIEW  Comparison: 02/25/2012  Findings: Low lung volumes are again demonstrated.  There is increased patchy airspace disease seen in the peripheral lung zones bilaterally.  No evidence of pleural effusion.  Heart size is stable.  IMPRESSION: Increased patchy bilateral airspace disease.   Original Report Authenticated By: Myles Rosenthal, M.D.     ASSESSMENT: This is a very pleasant 67 years old African American male with multiple myeloma currently on treatment with Pomalyst to that has been on hold for the last few weeks secondary to her recent hospitalization with respiratory failure with pneumonia. His myeloma panel showed mild improvement in his disease. The patient is feeling better today.   PLAN: I recommended for him to resume his treatment with Pomalyst and Decadron. He would come back for followup visit in 4 weeks with the start of the next cycle of his treatment. He was advised to call me immediately if he has any concerning symptoms in the interval.  All questions were answered. The patient knows to call the clinic with any problems, questions or concerns. We can certainly see the patient much sooner if necessary.  I spent 15 minutes counseling the patient face to face. The total time spent in the appointment  was 25 minutes.

## 2012-03-23 NOTE — Telephone Encounter (Signed)
Gave pt appt for lab, ML and chemo for February 2014 and [facility]

## 2012-03-23 NOTE — Patient Instructions (Signed)
Myeloma panel showed mild improvement in her disease. I recommended for you to resume treatment with Pomalyst and Decadron. Followup in one month

## 2012-03-23 NOTE — Progress Notes (Addendum)
INR = 2.1 Pt has received 40 mg of coumadin in the past week. He is doing fine w/o complaints re: anticoag. Since INR is now at goal, I will stop his Arixtra. I will increase his Coumadin dose slightly to total of 42.5 mg/week.  He will take another dose of 7.5 mg tomorrow then 5 mg rest of week. Next week he will start 7.5 mg Sun/Tu/Thurs; 5 mg other days. Return Thurs 2/6.  If INR stable then we'll continue w/ above plan.  If it is high, we can alter that day's dose. I have reviewed plan w/ pt & his wife Hope. Plan per Dr. Arbutus Ped is 3-6 mos of anticoagulation.  Pt is being seen by Dr. Arbutus Ped today. Marily Lente, Pharm.D.

## 2012-03-25 ENCOUNTER — Other Ambulatory Visit: Payer: Self-pay | Admitting: Medical Oncology

## 2012-03-25 DIAGNOSIS — C9000 Multiple myeloma not having achieved remission: Secondary | ICD-10-CM

## 2012-03-25 MED ORDER — POMALIDOMIDE 4 MG PO CAPS: 4.0000 mg | ORAL_CAPSULE | Freq: Every day | ORAL | Status: DC

## 2012-03-25 NOTE — Telephone Encounter (Signed)
Pomalyst rx sent to Dr Arbutus Ped for authorization

## 2012-03-29 NOTE — Telephone Encounter (Signed)
RECEIVED A FAX FROM [facility] CONCERNING A CONFIRMATION OF PRESCRIPTION SHIPMENT FOR POMALYST ON 03/28/12.

## 2012-03-30 ENCOUNTER — Other Ambulatory Visit: Payer: Self-pay

## 2012-03-31 ENCOUNTER — Other Ambulatory Visit (HOSPITAL_BASED_OUTPATIENT_CLINIC_OR_DEPARTMENT_OTHER): Payer: 59 | Admitting: Lab

## 2012-03-31 ENCOUNTER — Ambulatory Visit (HOSPITAL_BASED_OUTPATIENT_CLINIC_OR_DEPARTMENT_OTHER): Payer: 59 | Admitting: Pharmacist

## 2012-03-31 DIAGNOSIS — I82409 Acute embolism and thrombosis of unspecified deep veins of unspecified lower extremity: Secondary | ICD-10-CM

## 2012-03-31 DIAGNOSIS — C9000 Multiple myeloma not having achieved remission: Secondary | ICD-10-CM

## 2012-03-31 DIAGNOSIS — C9002 Multiple myeloma in relapse: Secondary | ICD-10-CM

## 2012-03-31 LAB — CBC WITH DIFFERENTIAL/PLATELET
BASO%: 0.9 % (ref 0.0–2.0)
Basophils Absolute: 0 10*3/uL (ref 0.0–0.1)
EOS%: 4.2 % (ref 0.0–7.0)
HCT: 33.8 % — ABNORMAL LOW (ref 38.4–49.9)
HGB: 11.1 g/dL — ABNORMAL LOW (ref 13.0–17.1)
LYMPH%: 20.2 % (ref 14.0–49.0)
MCH: 28.6 pg (ref 27.2–33.4)
MCHC: 32.7 g/dL (ref 32.0–36.0)
MCV: 87.4 fL (ref 79.3–98.0)
MONO%: 10.8 % (ref 0.0–14.0)
NEUT%: 63.9 % (ref 39.0–75.0)
Platelets: 381 10*3/uL (ref 140–400)

## 2012-03-31 LAB — COMPREHENSIVE METABOLIC PANEL (CC13)
ALT: 8 U/L (ref 0–55)
AST: 15 U/L (ref 5–34)
CO2: 22 mEq/L (ref 22–29)
Chloride: 109 mEq/L — ABNORMAL HIGH (ref 98–107)
Sodium: 141 mEq/L (ref 136–145)
Total Bilirubin: 0.44 mg/dL (ref 0.20–1.20)
Total Protein: 7.3 g/dL (ref 6.4–8.3)

## 2012-03-31 LAB — LACTATE DEHYDROGENASE (CC13): LDH: 259 U/L — ABNORMAL HIGH (ref 125–245)

## 2012-03-31 NOTE — Patient Instructions (Addendum)
INR at goal No changes Continue 1 tablet daily except for take 1.5 tablets on Sunday, Tuesday, and Thursday Return for Lab and Coumadin clinic on 2/19 at 9:30 am lab and 9:45 for coumadin clinic

## 2012-03-31 NOTE — Progress Notes (Signed)
INR appears to be stable at current dose. INR at goal (2.5 today). Pt is off Arixtra and states no bleeding issues or missed doses. Will continue same regimen of 5 mg daily except for 7.5 mg on Tuesday, Thursday, and Sunday. Return with scheduled lab appointment on 04/13/12 at 9:30am lab and 9:45 am coumadin

## 2012-04-04 ENCOUNTER — Ambulatory Visit: Payer: Self-pay

## 2012-04-04 ENCOUNTER — Other Ambulatory Visit: Payer: 59

## 2012-04-11 ENCOUNTER — Other Ambulatory Visit: Payer: Self-pay | Admitting: Medical Oncology

## 2012-04-11 ENCOUNTER — Encounter: Payer: Self-pay | Admitting: Medical Oncology

## 2012-04-11 DIAGNOSIS — C9000 Multiple myeloma not having achieved remission: Secondary | ICD-10-CM

## 2012-04-11 DIAGNOSIS — R52 Pain, unspecified: Secondary | ICD-10-CM

## 2012-04-11 MED ORDER — OXYCODONE HCL 40 MG PO TB12: 40.0000 mg | ORAL_TABLET | Freq: Two times a day (BID) | ORAL | Status: DC

## 2012-04-11 NOTE — Telephone Encounter (Signed)
Requested refill oxycontin. And letter to dismiss him from jury duty.Prescription locked in injection room.

## 2012-04-13 ENCOUNTER — Other Ambulatory Visit: Payer: 59 | Admitting: Lab

## 2012-04-13 ENCOUNTER — Ambulatory Visit (HOSPITAL_BASED_OUTPATIENT_CLINIC_OR_DEPARTMENT_OTHER): Payer: 59 | Admitting: Pharmacist

## 2012-04-13 LAB — CBC WITH DIFFERENTIAL/PLATELET
Basophils Absolute: 0 10*3/uL (ref 0.0–0.1)
EOS%: 6.1 % (ref 0.0–7.0)
HCT: 34.9 % — ABNORMAL LOW (ref 38.4–49.9)
HGB: 11.2 g/dL — ABNORMAL LOW (ref 13.0–17.1)
LYMPH%: 27 % (ref 14.0–49.0)
MCH: 28.1 pg (ref 27.2–33.4)
MCV: 87.4 fL (ref 79.3–98.0)
MONO%: 17.8 % — ABNORMAL HIGH (ref 0.0–14.0)
NEUT%: 48.3 % (ref 39.0–75.0)
Platelets: 149 10*3/uL (ref 140–400)
RDW: 17.5 % — ABNORMAL HIGH (ref 11.0–14.6)

## 2012-04-13 LAB — COMPREHENSIVE METABOLIC PANEL (CC13)
ALT: 15 U/L (ref 0–55)
AST: 9 U/L (ref 5–34)
Albumin: 3 g/dL — ABNORMAL LOW (ref 3.5–5.0)
Calcium: 9.5 mg/dL (ref 8.4–10.4)
Chloride: 109 mEq/L — ABNORMAL HIGH (ref 98–107)
Potassium: 4.2 mEq/L (ref 3.5–5.1)

## 2012-04-13 LAB — LACTATE DEHYDROGENASE (CC13): LDH: 181 U/L (ref 125–245)

## 2012-04-13 NOTE — Patient Instructions (Signed)
Continue taking Coumadin 7.5 mg Mon, Tues, Thurs & 5 mg all other days. Repeat INR on 05/02/12 at 11:30 am for lab; 12:00 infusion and 12:15 the coumadin clinic pharmacist will see you in infusion area.

## 2012-04-13 NOTE — Progress Notes (Signed)
No changes to report.  Wife takes care of all his medicine on a daily basis.  He will continue taking Coumadin 7.5 mg Mon, Tues, Thurs & 5 mg all other days. Repeat INR on 05/02/12 at 11:30 am for lab; 12:00 infusion (q 2mos zometa) and 12:15 the coumadin clinic pharmacist will see you in infusion area.

## 2012-04-14 ENCOUNTER — Other Ambulatory Visit: Payer: Self-pay | Admitting: Medical Oncology

## 2012-04-14 ENCOUNTER — Encounter: Payer: Self-pay | Admitting: Medical Oncology

## 2012-04-14 NOTE — Telephone Encounter (Signed)
Increased pain in left leg around ankle that left him writhing in pain . Oxycodone helped relieve pain . Pt also has dilaudid from orthopedic MD -he is not taking that. I instructed pt ( per Adrena ) to take his oxycodone and dilaudid in between oxycontin. I told wife to call back in am with update.

## 2012-04-19 ENCOUNTER — Telehealth: Payer: Self-pay | Admitting: Medical Oncology

## 2012-04-19 NOTE — Telephone Encounter (Signed)
Given to Dr Arbutus Ped for refill

## 2012-04-20 ENCOUNTER — Ambulatory Visit (HOSPITAL_BASED_OUTPATIENT_CLINIC_OR_DEPARTMENT_OTHER): Payer: 59 | Admitting: Physician Assistant

## 2012-04-20 ENCOUNTER — Telehealth: Payer: Self-pay | Admitting: Internal Medicine

## 2012-04-20 ENCOUNTER — Other Ambulatory Visit (HOSPITAL_BASED_OUTPATIENT_CLINIC_OR_DEPARTMENT_OTHER): Payer: 59 | Admitting: Lab

## 2012-04-20 ENCOUNTER — Telehealth: Payer: Self-pay | Admitting: *Deleted

## 2012-04-20 VITALS — BP 142/87 | HR 61 | Temp 97.7°F | Resp 20 | Ht 67.0 in | Wt 199.7 lb

## 2012-04-20 DIAGNOSIS — T451X5A Adverse effect of antineoplastic and immunosuppressive drugs, initial encounter: Secondary | ICD-10-CM

## 2012-04-20 DIAGNOSIS — C9000 Multiple myeloma not having achieved remission: Secondary | ICD-10-CM

## 2012-04-20 DIAGNOSIS — I82409 Acute embolism and thrombosis of unspecified deep veins of unspecified lower extremity: Secondary | ICD-10-CM

## 2012-04-20 DIAGNOSIS — C9002 Multiple myeloma in relapse: Secondary | ICD-10-CM

## 2012-04-20 DIAGNOSIS — D702 Other drug-induced agranulocytosis: Secondary | ICD-10-CM

## 2012-04-20 LAB — LACTATE DEHYDROGENASE (CC13): LDH: 182 U/L (ref 125–245)

## 2012-04-20 LAB — CBC WITH DIFFERENTIAL/PLATELET
Basophils Absolute: 0 10*3/uL (ref 0.0–0.1)
EOS%: 4.3 % (ref 0.0–7.0)
HGB: 11.3 g/dL — ABNORMAL LOW (ref 13.0–17.1)
LYMPH%: 30.6 % (ref 14.0–49.0)
MCH: 29 pg (ref 27.2–33.4)
MCV: 87.1 fL (ref 79.3–98.0)
MONO%: 18 % — ABNORMAL HIGH (ref 0.0–14.0)
RBC: 3.91 10*6/uL — ABNORMAL LOW (ref 4.20–5.82)
RDW: 17.6 % — ABNORMAL HIGH (ref 11.0–14.6)

## 2012-04-20 LAB — COMPREHENSIVE METABOLIC PANEL (CC13)
AST: 12 U/L (ref 5–34)
BUN: 31.1 mg/dL — ABNORMAL HIGH (ref 7.0–26.0)
Calcium: 9.2 mg/dL (ref 8.4–10.4)
Chloride: 107 mEq/L (ref 98–107)
Creatinine: 1.5 mg/dL — ABNORMAL HIGH (ref 0.7–1.3)
Glucose: 100 mg/dl — ABNORMAL HIGH (ref 70–99)

## 2012-04-20 LAB — PROTIME-INR: INR: 2.5 (ref 2.00–3.50)

## 2012-04-20 NOTE — Patient Instructions (Addendum)
Take Neupogen subcutaneously for the next 4 days as instructed Return next week for repeat lab studies Resume Pomalyst therapy when instructed Followup in 4 weeks

## 2012-04-20 NOTE — Telephone Encounter (Signed)
[facility] faxed Pomalyst prescription refill request.  Request to MD for review.

## 2012-04-21 ENCOUNTER — Other Ambulatory Visit: Payer: Self-pay | Admitting: *Deleted

## 2012-04-21 MED ORDER — POMALIDOMIDE 4 MG PO CAPS: 4.0000 mg | ORAL_CAPSULE | Freq: Every day | ORAL | Status: DC

## 2012-04-21 MED ORDER — FILGRASTIM 480 MCG/0.8ML IJ SOLN: 480.0000 ug | INTRAMUSCULAR | Status: DC

## 2012-04-21 NOTE — Progress Notes (Signed)
[facility] [phone]   [phone]  OFFICE PROGRESS NOTE  Katy Apo, MD [address] [employer] [address] [place] [address]  DIAGNOSIS:  Multiple myeloma IgA subtype diagnosed in November, 2009.   PRIOR THERAPY:  1. Status post seven cycles of systemic chemotherapy with Revlimid and low-dose Betatron with partial response. The last dose was given June, 2010. 2. Status post chemotherapy with weekly Velcade in a patient on Decadron 40 mg on a weekly basis, status post 75 weekly doses. The last dose was given May 04, 2008. 3. Status post treatment again with Velcade and Decadron. The patient was awaiting stem cell transplant at [facility]. Last dose was given Jul 16, 2009. 4. The patient was not considered a good candidate for autologous stem cell transplant at that time secondary to poor mobilization of his stem cells.  5. Status post six cycles of systemic chemotherapy with Velcade, Cytoxan and Betatron. Last dose was given March 25, 2010. This continued secondary to his disease progression. 6. Status post ten cycles of systemic chemotherapy with Velcade. Doxil and Decadron. Last dose was given on 10/31/2010. Currently on hold because of concern about cardiac toxicity.  CURRENT THERAPY:  1. Pomalyst 4 mg by mouth daily for 21 days every 28 days, completed cycle #8. 2. Decadron 40 mg by mouth weekly  INTERVAL HISTORY: Rodney Green 67 y.o. male returns to the clinic today for followup visit. He completed his most recent cycle of Pomalyst on 04/17/2012, this would be cycle #8. He continues to tolerate his therapy relatively well with the exception of intermittent neutropenia. This has been treated successfully with intermittent use of Neupogen. The patient is feeling fine today with no specific complaints except for occasional some lower extremity swelling. He has a known right lower extremity deep vein thrombosis and is  currently on Coumadin therapy. The Coumadin therapy is being managed by the [facility] Cou[facility].  He continues to have some shortness of breath with exertion. He denied having any significant chest pain, cough or hemoptysis. He denied having any significant weight loss or night sweats.   MEDICAL HISTORY: Past Medical History  Diagnosis Date  . Allergy   . Diabetes mellitus   . Hypertension   . Hepatitis C   . Multiple myeloma(203.0)   . Multiple myeloma   . Chronic kidney disease     ALLERGIES:  is allergic to ace inhibitors.  MEDICATIONS:  Current Outpatient Prescriptions  Medication Sig Dispense Refill  . amLODipine (NORVASC) 5 MG tablet Take 5 mg by mouth daily.        Marland Kitchen aspirin 81 MG tablet Take 81 mg by mouth daily.      Marland Kitchen atenolol (TENORMIN) 100 MG tablet Take 100 mg by mouth daily.        Marland Kitchen dexamethasone (DECADRON) 4 MG tablet Take 1 tablet (4 mg total) by mouth as directed.  130 tablet  0  . docusate sodium (COLACE) 100 MG capsule Take 200-300 mg by mouth 2 (two) times daily. Takes 2 capsules(200mg ) every morning and 3 capsules(300mg ) every night      . filgrastim (NEUPOGEN) 480 MCG/0.8ML SOLN injection Inject 0.8 mLs (480 mcg total) into the skin as directed.  4 Syringe  1  . HYDROmorphone (DILAUDID) 2 MG tablet       . insulin glargine (LANTUS) 100 UNIT/ML injection Inject 64 Units into the skin at bedtime.       Marland Kitchen  mineral oil external liquid by Does not apply route every other day.      Marland Kitchen NOVOLOG MIX 70/30 (70-30) 100 UNIT/ML injection Inject 2-28 Units into the skin once a week. Per sliding scale.Marland KitchenMarland KitchenOnly takes morning after taking dexamethasone      . oxycodone (OXY-IR) 5 MG capsule Take 5 mg by mouth every 4 (four) hours as needed. Prescribed by [facility] MD      . oxyCODONE (OXYCONTIN) 40 MG 12 hr tablet Take 1 tablet (40 mg total) by mouth every 12 (twelve) hours.  60 tablet  0  . pomalidomide (POMALYST) 4 MG capsule Take 1 capsule (4 mg total) by mouth  daily.  21 capsule  0  . senna (SENOKOT) 8.6 MG tablet Take 1 tablet by mouth daily.      . simvastatin (ZOCOR) 10 MG tablet Take 10 mg by mouth at bedtime.        . Tamsulosin HCl (FLOMAX) 0.4 MG CAPS Take 1 capsule (0.4 mg total) by mouth daily after supper.  30 capsule  2  . valsartan-hydrochlorothiazide (DIOVAN-HCT) 160-12.5 MG per tablet Take 1 tablet by mouth daily.        Marland Kitchen warfarin (COUMADIN) 5 MG tablet Take 1 tablet (5 mg total) by mouth daily. Take 5mg  daily or as directed.  50 tablet  0   No current facility-administered medications for this visit.    SURGICAL HISTORY:  Past Surgical History  Procedure Laterality Date  . Circumcision    . Hand surgery      REVIEW OF SYSTEMS:  A comprehensive review of systems was negative except for: Constitutional: positive for fatigue Respiratory: positive for dyspnea on exertion Lower extremity swelling   PHYSICAL EXAMINATION: General appearance: alert, cooperative and no distress Head: Normocephalic, without obvious abnormality, atraumatic Neck: no adenopathy Lymph nodes: Cervical, supraclavicular, and axillary nodes normal. Resp: clear to auscultation bilaterally Cardio: regular rate and rhythm, S1, S2 normal, no murmur, click, rub or gallop GI: soft, non-tender; bowel sounds normal; no masses,  no organomegaly Extremities: edema 2+ pitting edema right lower extremity, trace to 1+ pitting edema left lower external Neurologic: Alert and oriented X 3, normal strength and tone. Normal symmetric reflexes. Normal coordination and gait  ECOG PERFORMANCE STATUS: 1 - Symptomatic but completely ambulatory  Blood pressure 142/87, pulse 61, temperature 97.7 F (36.5 C), temperature source Oral, resp. rate 20, height 5\' 7"  (1.702 m), weight 199 lb 11.2 oz (90.583 kg).  LABORATORY DATA: Lab Results  Component Value Date   WBC 1.3* 04/20/2012   HGB 11.3* 04/20/2012   HCT 34.0* 04/20/2012   MCV 87.1 04/20/2012   PLT 157 04/20/2012       Chemistry      Component Value Date/Time   NA 142 04/20/2012 0930   NA 138 02/29/2012 0436   K 4.3 04/20/2012 0930   K 3.9 02/29/2012 0436   CL 107 04/20/2012 0930   CL 104 02/29/2012 0436   CO2 25 04/20/2012 0930   CO2 25 02/29/2012 0436   BUN 31.1* 04/20/2012 0930   BUN 17 02/29/2012 0436   CREATININE 1.5* 04/20/2012 0930   CREATININE 1.13 02/29/2012 0436   GLU 293* 08/12/2009 1322      Component Value Date/Time   CALCIUM 9.2 04/20/2012 0930   CALCIUM 9.3 02/29/2012 0436   ALKPHOS 126 04/20/2012 0930   ALKPHOS 118* 02/26/2012 0520   AST 12 04/20/2012 0930   AST 17 02/26/2012 0520   ALT 16 04/20/2012 0930   ALT  9 02/26/2012 0520   BILITOT 0.46 04/20/2012 0930   BILITOT 0.5 02/26/2012 0520       RADIOGRAPHIC STUDIES: Dg Chest 2 View  02/25/2012  *RADIOLOGY REPORT*  Clinical Data: Short of breath  CHEST - 2 VIEW  Comparison: 01/27/2012  Findings: Low lung volumes and bibasilar scarring is again demonstrated.  Normal heart size.  No effusion or edema.  No airspace consolidation.  IMPRESSION:  1.  Chronic low lung volumes with bibasilar atelectasis.   Original Report Authenticated By: Signa Kell, M.D.    Ct Chest Wo Contrast  02/25/2012  *RADIOLOGY REPORT*  Clinical Data: Hypoxia; shortness of breath.  Acute respiratory failure.  CT CHEST WITHOUT CONTRAST  Technique:  Multidetector CT imaging of the chest was performed following the standard protocol without IV contrast.  Comparison: CT of the chest performed 10/24/2011  Findings: Patchy bilateral airspace opacification is noted, favoring the upper lung lobes, though also seen within both lower lobes.  Focal density at the right lung base is thought to reflect scarring.  Previously noted nodularity at the left upper lobe has resolved, and was likely infectious in nature.  No pleural effusion or pneumothorax is seen.  Diffuse coronary artery calcifications are seen.  The mediastinum is otherwise unremarkable in appearance.  Scattered mediastinal nodes remain normal  in size.  No pericardial effusion is identified.  Mild calcific atherosclerotic disease is noted along the proximal great vessels; the great vessels are otherwise grossly unremarkable.  The thyroid gland is unremarkable in appearance.  No axillary lymphadenopathy is seen.  The visualized portions of the liver and spleen are unremarkable in appearance.  Mildly increased attenuation layering within the gallbladder may reflect sludge or tiny stones.  Nonspecific perinephric stranding is again noted bilaterally.  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  No acute osseous abnormalities are identified.  As noted on the prior study, there are compression deformities involving T5-L1, and changes of vertebroplasty are noted at L1.  These appear grossly stable from the prior study.  IMPRESSION:  1.  Patchy bilateral airspace opacification, favoring the upper lung lobes, compatible with multifocal pneumonia. 2.  Focal density at the right lung base is thought to reflect scarring; the previously noted nodule at the left upper lobe has resolved and was likely infectious in nature. 3.  Diffuse coronary artery calcifications seen. 4.  Mildly increased attenuation layering within the gallbladder may reflect sludge or tiny stones. 5.  Extensive chronic compression deformities along the thoracic and upper lumbar spine, unchanged from the prior CT.   Original Report Authenticated By: Tonia Ghent, M.D.    Nm Pulmonary Perf And Vent  02/25/2012  *RADIOLOGY REPORT*  Clinical Data: Short of breath  NM PULMONARY VENTILATION AND PERFUSION SCAN  Radiopharmaceutical: 5.7MILLI CURIE MAA TECHNETIUM TO 27M ALBUMIN AGGREGATED  Comparison: 02/25/2012  Findings: Exam detail diminish due to low lung volumes.  The chest radiograph from today the lungs appear hypoinflated.  Within the right lung base there is a moderate sized perfusion defect with corresponding ventilation  abnormality.  No unmatched segmental perfusion defects identified.  IMPRESSION:  1. Diminished exam detail due to low lung volumes.  Low probability for acute pulmonary embolus.   Original Report Authenticated By: Signa Kell, M.D.    Dg Chest Port 1 View  02/29/2012  *RADIOLOGY REPORT*  Clinical Data: Pneumonia.  PORTABLE CHEST - 1 VIEW  Comparison: Plain film of the chest 02/25/2012 and 02/27/2012. CT chest  02/25/2012.  Findings: Patchy bilateral airspace disease persists but has improved since the most recent plain film.  No pneumothorax is identified.  Heart size is normal.  There is likely a trace right pleural effusion.  IMPRESSION: Some improvement in bilateral airspace disease.   Original Report Authenticated By: Holley Dexter, M.D.    Dg Chest Port 1 View  02/27/2012  *RADIOLOGY REPORT*  Clinical Data: Pneumonia.  Acute respiratory failure with hypoxia. Neutropenia.  PORTABLE CHEST - 1 VIEW  Comparison: 02/25/2012  Findings: Low lung volumes are again demonstrated.  There is increased patchy airspace disease seen in the peripheral lung zones bilaterally.  No evidence of pleural effusion.  Heart size is stable.  IMPRESSION: Increased patchy bilateral airspace disease.   Original Report Authenticated By: Myles Rosenthal, M.D.     ASSESSMENT/PLAN: This is a very pleasant 67 years old African American male with multiple myeloma currently on treatment with Pomalyst to that has been on hold for the last few weeks secondary to her recent hospitalization with respiratory failure with pneumonia. His myeloma panel showed mild improvement in his disease. The patient is relatively well today. Patient was discussed with Dr. Arbutus Ped. He is recommended to elevate his lower extremities periodically throughout the day and also decrease his sodium intake. He he is neutropenic with an ANC of 0.6. He was advised to take Neupogen at 480 mcg subcutaneously daily for the next 4 days. He will require a refill of [place]ipple  take care of this with his specialty pharmacy. He will return in one week for repeat CBC differential to reevaluate his counts.. The patient knows not to restart his, list until we instructed to do so. He'll followup in 4 weeks for another symptom management visit with a repeat CBC differential, C. met and LDH.  Rodney Hamid E, PA-C  . He was advised to call  immediately if he has any concerning symptoms in the interval.  All questions were answered. The patient knows to call the clinic with any problems, questions or concerns. We can certainly see the patient much sooner if necessary.  I spent 20 minutes counseling the patient face to face. The total time spent in the appointment was 30 minutes.

## 2012-04-22 ENCOUNTER — Other Ambulatory Visit: Payer: Self-pay | Admitting: Physician Assistant

## 2012-04-22 ENCOUNTER — Ambulatory Visit: Payer: Self-pay | Admitting: Physician Assistant

## 2012-04-25 ENCOUNTER — Other Ambulatory Visit: Payer: 59

## 2012-04-25 NOTE — Telephone Encounter (Signed)
RECEIVED A FAX FROM [facility] CONCERNING A CONFIRMATION OF PRESCRIPTION SHIPMENT FOR POMALYST ON 04/22/12.

## 2012-04-27 ENCOUNTER — Other Ambulatory Visit (HOSPITAL_BASED_OUTPATIENT_CLINIC_OR_DEPARTMENT_OTHER): Payer: 59 | Admitting: Lab

## 2012-04-27 DIAGNOSIS — C9000 Multiple myeloma not having achieved remission: Secondary | ICD-10-CM

## 2012-04-27 DIAGNOSIS — D701 Agranulocytosis secondary to cancer chemotherapy: Secondary | ICD-10-CM

## 2012-04-27 DIAGNOSIS — C9002 Multiple myeloma in relapse: Secondary | ICD-10-CM

## 2012-04-27 DIAGNOSIS — T451X5A Adverse effect of antineoplastic and immunosuppressive drugs, initial encounter: Secondary | ICD-10-CM

## 2012-04-27 DIAGNOSIS — I82409 Acute embolism and thrombosis of unspecified deep veins of unspecified lower extremity: Secondary | ICD-10-CM

## 2012-04-27 LAB — COMPREHENSIVE METABOLIC PANEL (CC13)
ALT: 13 U/L (ref 0–55)
CO2: 28 mEq/L (ref 22–29)
Calcium: 9.5 mg/dL (ref 8.4–10.4)
Chloride: 102 mEq/L (ref 98–107)
Potassium: 3.9 mEq/L (ref 3.5–5.1)
Sodium: 140 mEq/L (ref 136–145)
Total Protein: 6.7 g/dL (ref 6.4–8.3)

## 2012-04-27 LAB — CBC WITH DIFFERENTIAL/PLATELET
Basophils Absolute: 0.1 10*3/uL (ref 0.0–0.1)
HCT: 37.1 % — ABNORMAL LOW (ref 38.4–49.9)
HGB: 12.3 g/dL — ABNORMAL LOW (ref 13.0–17.1)
MONO#: 0.5 10*3/uL (ref 0.1–0.9)
NEUT%: 63.7 % (ref 39.0–75.0)
WBC: 3.2 10*3/uL — ABNORMAL LOW (ref 4.0–10.3)
lymph#: 0.5 10*3/uL — ABNORMAL LOW (ref 0.9–3.3)

## 2012-04-27 LAB — LACTATE DEHYDROGENASE (CC13): LDH: 194 U/L (ref 125–245)

## 2012-04-27 LAB — PROTIME-INR

## 2012-04-28 ENCOUNTER — Telehealth: Payer: Self-pay | Admitting: *Deleted

## 2012-04-28 NOTE — Telephone Encounter (Signed)
[facility] called stating that they just received his neupogen injections today and wants to know if he should still take 2 more injections equaling 4 total (he took 2 last week and [facility] had wanted him to take 4).  Per Dr Donnald Garre, okay for pt to go ahead and take 2 more which will equal the 4 total he was supposed to take.  Left msg on [facility]'s cell phone with instructions.  SLJ

## 2012-05-02 ENCOUNTER — Other Ambulatory Visit (HOSPITAL_BASED_OUTPATIENT_CLINIC_OR_DEPARTMENT_OTHER): Payer: 59 | Admitting: Lab

## 2012-05-02 ENCOUNTER — Ambulatory Visit: Payer: Self-pay | Admitting: Pharmacist

## 2012-05-02 ENCOUNTER — Telehealth: Payer: Self-pay | Admitting: Medical Oncology

## 2012-05-02 ENCOUNTER — Ambulatory Visit (HOSPITAL_BASED_OUTPATIENT_CLINIC_OR_DEPARTMENT_OTHER): Payer: 59

## 2012-05-02 VITALS — BP 131/79 | HR 75 | Temp 97.2°F

## 2012-05-02 DIAGNOSIS — C9002 Multiple myeloma in relapse: Secondary | ICD-10-CM

## 2012-05-02 DIAGNOSIS — C9000 Multiple myeloma not having achieved remission: Secondary | ICD-10-CM

## 2012-05-02 LAB — COMPREHENSIVE METABOLIC PANEL (CC13)
ALT: 19 U/L (ref 0–55)
AST: 11 U/L (ref 5–34)
CO2: 25 mEq/L (ref 22–29)
Calcium: 8.2 mg/dL — ABNORMAL LOW (ref 8.4–10.4)
Chloride: 108 mEq/L — ABNORMAL HIGH (ref 98–107)
Sodium: 143 mEq/L (ref 136–145)
Total Protein: 6.8 g/dL (ref 6.4–8.3)

## 2012-05-02 LAB — CBC WITH DIFFERENTIAL/PLATELET
Basophils Absolute: 0 10*3/uL (ref 0.0–0.1)
Eosinophils Absolute: 0.2 10*3/uL (ref 0.0–0.5)
HCT: 35 % — ABNORMAL LOW (ref 38.4–49.9)
HGB: 11.6 g/dL — ABNORMAL LOW (ref 13.0–17.1)
MONO#: 0.4 10*3/uL (ref 0.1–0.9)
NEUT#: 4.3 10*3/uL (ref 1.5–6.5)
NEUT%: 80.1 % — ABNORMAL HIGH (ref 39.0–75.0)
WBC: 5.4 10*3/uL (ref 4.0–10.3)
lymph#: 0.5 10*3/uL — ABNORMAL LOW (ref 0.9–3.3)

## 2012-05-02 LAB — PROTIME-INR

## 2012-05-02 LAB — LACTATE DEHYDROGENASE (CC13): LDH: 214 U/L (ref 125–245)

## 2012-05-02 MED ORDER — AZITHROMYCIN 250 MG PO TABS: ORAL_TABLET | ORAL | Status: DC

## 2012-05-02 MED ORDER — SODIUM CHLORIDE 0.9 % IV SOLN
Freq: Once | INTRAVENOUS | Status: AC
  Administered 2012-05-02: 12:00:00 via INTRAVENOUS

## 2012-05-02 MED ORDER — ZOLEDRONIC ACID 4 MG/100ML IV SOLN
4.0000 mg | Freq: Once | INTRAVENOUS | Status: AC
  Administered 2012-05-02: 4 mg via INTRAVENOUS
  Filled 2012-05-02: qty 100

## 2012-05-02 NOTE — Addendum Note (Signed)
Addended by: Charma Igo on: 05/02/2012 05:05 PM   Modules accepted: Orders

## 2012-05-02 NOTE — Patient Instructions (Addendum)
Zoledronic Acid injection (Hypercalcemia, Oncology) What is this medicine? ZOLEDRONIC ACID (ZOE le dron ik AS id) lowers the amount of calcium loss from bone. It is used to treat too much calcium in your blood from cancer. It is also used to prevent complications of cancer that has spread to the bone. This medicine may be used for other purposes; ask your health care provider or pharmacist if you have questions. What should I tell my health care provider before I take this medicine? They need to know if you have any of these conditions: -aspirin-sensitive asthma -dental disease -kidney disease -an unusual or allergic reaction to zoledronic acid, other medicines, foods, dyes, or preservatives -pregnant or trying to get pregnant -breast-feeding How should I use this medicine? This medicine is for infusion into a vein. It is given by a health care professional in a hospital or clinic setting. Talk to your pediatrician regarding the use of this medicine in children. Special care may be needed. Overdosage: If you think you have taken too much of this medicine contact a poison control center or emergency room at once. NOTE: This medicine is only for you. Do not share this medicine with others. What if I miss a dose? It is important not to miss your dose. Call your doctor or health care professional if you are unable to keep an appointment. What may interact with this medicine? -certain antibiotics given by injection -NSAIDs, medicines for pain and inflammation, like ibuprofen or naproxen -some diuretics like bumetanide, furosemide -teriparatide -thalidomide This list may not describe all possible interactions. Give your health care provider a list of all the medicines, herbs, non-prescription drugs, or dietary supplements you use. Also tell them if you smoke, drink alcohol, or use illegal drugs. Some items may interact with your medicine. What should I watch for while using this medicine? Visit  your doctor or health care professional for regular checkups. It may be some time before you see the benefit from this medicine. Do not stop taking your medicine unless your doctor tells you to. Your doctor may order blood tests or other tests to see how you are doing. Women should inform their doctor if they wish to become pregnant or think they might be pregnant. There is a potential for serious side effects to an unborn child. Talk to your health care professional or pharmacist for more information. You should make sure that you get enough calcium and vitamin D while you are taking this medicine. Discuss the foods you eat and the vitamins you take with your health care professional. Some people who take this medicine have severe bone, joint, and/or muscle pain. This medicine may also increase your risk for a broken thigh bone. Tell your doctor right away if you have pain in your upper leg or groin. Tell your doctor if you have any pain that does not go away or that gets worse. What side effects may I notice from receiving this medicine? Side effects that you should report to your doctor or health care professional as soon as possible: -allergic reactions like skin rash, itching or hives, swelling of the face, lips, or tongue -anxiety, confusion, or depression -breathing problems -changes in vision -feeling faint or lightheaded, falls -jaw burning, cramping, pain -muscle cramps, stiffness, or weakness -trouble passing urine or change in the amount of urine Side effects that usually do not require medical attention (report to your doctor or health care professional if they continue or are bothersome): -bone, joint, or muscle pain -  fever -hair loss -irritation at site where injected -loss of appetite -nausea, vomiting -stomach upset -tired This list may not describe all possible side effects. Call your doctor for medical advice about side effects. You may report side effects to FDA at  1-800-FDA-1088. Where should I keep my medicine? This drug is given in a hospital or clinic and will not be stored at home. NOTE: This sheet is a summary. It may not cover all possible information. If you have questions about this medicine, talk to your doctor, pharmacist, or health care provider.  2013, [employer]. (08/08/2010 9:06:58 AM)  

## 2012-05-02 NOTE — Patient Instructions (Signed)
Decrease coumadin slightly to 5mg  daily except 7.5mg  on Tu & Thu. Recheck INR in 2 weeks with scheduled lab and [facility] apt on 05/18/12; Lab at 10:15am, [facility] apt at 10:45am and coumadin clinic at 11:15am.

## 2012-05-02 NOTE — Telephone Encounter (Signed)
[facility] called to report pt has cold symptoms x 1 week ,with cough , congestion and " yellow /green sputum". Pt came in today for zometa and per infusion nurse his lungs clear with cough. I talked to pt and he denies fever sob and stated his' Phlegm is really clear. " I told him to call for worsening symptoms.

## 2012-05-02 NOTE — Progress Notes (Signed)
INR slightly above goal today. No problems to report. Pt seen in infusion area while receiving Zometa. No changes in diet, medications, etc. No missed doses. Will slightly decrease coumadin to 5mg  daily except 7.5mg  on Tu & Thu. Recheck INR in 2 weeks with scheduled lab and [facility] apt on 05/18/12; Lab at 10:15am, [facility] apt at 10:45am and coumadin clinic at 11:15am.

## 2012-05-13 ENCOUNTER — Other Ambulatory Visit: Payer: Self-pay | Admitting: *Deleted

## 2012-05-13 DIAGNOSIS — R52 Pain, unspecified: Secondary | ICD-10-CM

## 2012-05-13 DIAGNOSIS — C9000 Multiple myeloma not having achieved remission: Secondary | ICD-10-CM

## 2012-05-13 MED ORDER — OXYCODONE HCL 40 MG PO TB12: 40.0000 mg | ORAL_TABLET | Freq: Two times a day (BID) | ORAL | Status: DC

## 2012-05-16 ENCOUNTER — Other Ambulatory Visit: Payer: 59

## 2012-05-16 ENCOUNTER — Other Ambulatory Visit: Payer: Self-pay | Admitting: Medical Oncology

## 2012-05-16 DIAGNOSIS — C9000 Multiple myeloma not having achieved remission: Secondary | ICD-10-CM

## 2012-05-17 MED ORDER — POMALIDOMIDE 4 MG PO CAPS: 4.0000 mg | ORAL_CAPSULE | Freq: Every day | ORAL | Status: DC

## 2012-05-17 NOTE — Telephone Encounter (Signed)
Faxed pomalyst refill to [facility].

## 2012-05-18 ENCOUNTER — Telehealth: Payer: Self-pay | Admitting: Internal Medicine

## 2012-05-18 ENCOUNTER — Other Ambulatory Visit (HOSPITAL_BASED_OUTPATIENT_CLINIC_OR_DEPARTMENT_OTHER): Payer: 59 | Admitting: Lab

## 2012-05-18 ENCOUNTER — Ambulatory Visit: Payer: 59 | Admitting: Pharmacist

## 2012-05-18 ENCOUNTER — Encounter: Payer: Self-pay | Admitting: Physician Assistant

## 2012-05-18 ENCOUNTER — Ambulatory Visit (HOSPITAL_BASED_OUTPATIENT_CLINIC_OR_DEPARTMENT_OTHER): Payer: 59 | Admitting: Physician Assistant

## 2012-05-18 VITALS — BP 112/79 | HR 63 | Temp 97.1°F | Resp 20 | Ht 67.0 in | Wt 189.1 lb

## 2012-05-18 DIAGNOSIS — D709 Neutropenia, unspecified: Secondary | ICD-10-CM

## 2012-05-18 DIAGNOSIS — I82409 Acute embolism and thrombosis of unspecified deep veins of unspecified lower extremity: Secondary | ICD-10-CM

## 2012-05-18 DIAGNOSIS — C9002 Multiple myeloma in relapse: Secondary | ICD-10-CM

## 2012-05-18 DIAGNOSIS — C9000 Multiple myeloma not having achieved remission: Secondary | ICD-10-CM

## 2012-05-18 DIAGNOSIS — I82401 Acute embolism and thrombosis of unspecified deep veins of right lower extremity: Secondary | ICD-10-CM

## 2012-05-18 LAB — COMPREHENSIVE METABOLIC PANEL (CC13)
Albumin: 3 g/dL — ABNORMAL LOW (ref 3.5–5.0)
Alkaline Phosphatase: 150 U/L (ref 40–150)
BUN: 43.9 mg/dL — ABNORMAL HIGH (ref 7.0–26.0)
CO2: 25 mEq/L (ref 22–29)
Calcium: 9.7 mg/dL (ref 8.4–10.4)
Chloride: 103 mEq/L (ref 98–107)
Glucose: 152 mg/dl — ABNORMAL HIGH (ref 70–99)
Potassium: 4.2 mEq/L (ref 3.5–5.1)
Sodium: 139 mEq/L (ref 136–145)
Total Protein: 6.9 g/dL (ref 6.4–8.3)

## 2012-05-18 LAB — CBC WITH DIFFERENTIAL/PLATELET
Eosinophils Absolute: 0.1 10*3/uL (ref 0.0–0.5)
HCT: 36.9 % — ABNORMAL LOW (ref 38.4–49.9)
LYMPH%: 23.9 % (ref 14.0–49.0)
MCV: 86.6 fL (ref 79.3–98.0)
MONO#: 0.5 10*3/uL (ref 0.1–0.9)
MONO%: 21.7 % — ABNORMAL HIGH (ref 0.0–14.0)
NEUT#: 1.1 10*3/uL — ABNORMAL LOW (ref 1.5–6.5)
NEUT%: 46.2 % (ref 39.0–75.0)
Platelets: 239 10*3/uL (ref 140–400)
WBC: 2.5 10*3/uL — ABNORMAL LOW (ref 4.0–10.3)

## 2012-05-18 LAB — LACTATE DEHYDROGENASE (CC13): LDH: 179 U/L (ref 125–245)

## 2012-05-18 NOTE — Patient Instructions (Addendum)
Continue 5 mg daily except 7.5mg  on Tu & Thu. Recheck INR in 3 weeks with scheduled lab on 06/08/12.

## 2012-05-18 NOTE — Patient Instructions (Addendum)
Continue, list as prescribed Take Neupogen 480 mcg subcutaneously for the next 3 days Followup with Dr. Arbutus Ped in one month with repeat protein studies to reevaluate your disease

## 2012-05-18 NOTE — Progress Notes (Signed)
INR within goal today. Pt doing well. No problems to report. Pt will begin Neupogen daily x 3 days starting today for ANC = 1.1 Continue Coumadin 5 mg daily except 7.5mg  on Tu & Thu. Recheck INR in 3 weeks with scheduled lab on 06/08/12.

## 2012-05-20 NOTE — Telephone Encounter (Signed)
RECEIVED A FAX FROM [facility] CONCERNING A CONFIRMATION OF PRESCRIPTION SHIPMENT FOR POMALYST ON 05/19/12.

## 2012-05-22 NOTE — Progress Notes (Signed)
[facility] [phone]   [phone]  OFFICE PROGRESS NOTE  Katy Apo, MD [address] [place] [address]  DIAGNOSIS:  Multiple myeloma IgA subtype diagnosed in November, 2009.   PRIOR THERAPY:  1. Status post seven cycles of systemic chemotherapy with Revlimid and low-dose Betatron with partial response. The last dose was given June, 2010. 2. Status post chemotherapy with weekly Velcade in a patient on Decadron 40 mg on a weekly basis, status post 75 weekly doses. The last dose was given May 04, 2008. 3. Status post treatment again with Velcade and Decadron. The patient was awaiting stem cell transplant at [facility]. Last dose was given Jul 16, 2009. 4. The patient was not considered a good candidate for autologous stem cell transplant at that time secondary to poor mobilization of his stem cells.  5. Status post six cycles of systemic chemotherapy with Velcade, Cytoxan and Betatron. Last dose was given March 25, 2010. This continued secondary to his disease progression. 6. Status post ten cycles of systemic chemotherapy with Velcade. Doxil and Decadron. Last dose was given on 10/31/2010. Currently on hold because of concern about cardiac toxicity.  CURRENT THERAPY:  1. Pomalyst 4 mg by mouth daily for 21 days every 28 days, completed cycle #8. 2. Decadron 40 mg by mouth weekly  INTERVAL HISTORY: Rodney Green 67 y.o. male returns to the clinic today for followup visit. He completed his most recent cycle of Pomalyst on 05/15/2012, this would be cycle #8. He continues to tolerate his therapy relatively well with the exception of intermittent neutropenia. This has been treated successfully with intermittent use of Neupogen. He did have an upper respiratory illness for however these symptoms have all resolved except for a runny nose.He denied any fever or chills.  He feels he may have "cracked a rib"  when he was leaning over her car last week. He was working on his daughter's car and he felt something in his left anterior rib cage.  He reports some soreness but denied any difficulty breathing. He reports he has an appointment with his primary care physician April 1 and will review these symptoms at that visit.  He has a known right lower extremity deep vein thrombosis and is currently on Coumadin therapy. The Coumadin therapy is being managed by the [facility] Coumadin clinic.  He continues to have some shortness of breath with exertion. He denied having any significant chest pain, cough or hemoptysis. He denied having any significant weight loss or night sweats.   MEDICAL HISTORY: Past Medical History  Diagnosis Date  . Allergy   . Diabetes mellitus   . Hypertension   . Hepatitis C   . Multiple myeloma(203.0)   . Multiple myeloma   . Chronic kidney disease     ALLERGIES:  is allergic to ace inhibitors.  MEDICATIONS:  Current Outpatient Prescriptions  Medication Sig Dispense Refill  . amLODipine (NORVASC) 5 MG tablet Take 5 mg by mouth daily.        Marland Kitchen aspirin 81 MG tablet Take 81 mg by mouth daily.      Marland Kitchen atenolol (TENORMIN) 100 MG tablet Take 100 mg by mouth daily.        Marland Kitchen azithromycin (ZITHROMAX) 250 MG tablet Take as directed  6 each  0  . dexamethasone (DECADRON) 4 MG tablet Take 1 tablet (4 mg total) by mouth as directed.  130 tablet  0  . docusate sodium (COLACE) 100 MG capsule Take 200-300 mg by mouth 2 (two) times daily. Takes 2 capsules(200mg ) every morning and 3 capsules(300mg ) every night      . filgrastim (NEUPOGEN) 480 MCG/0.8ML SOLN injection Inject 0.8 mLs (480 mcg total) into the skin as directed.  4 Syringe  1  . HYDROmorphone (DILAUDID) 2 MG tablet       . insulin glargine (LANTUS) 100 UNIT/ML injection Inject 64 Units into the skin at bedtime.       . mineral oil external liquid by Does not apply route every other day.      Marland Kitchen NOVOLOG MIX 70/30  (70-30) 100 UNIT/ML injection Inject 2-28 Units into the skin once a week. Per sliding scale.Marland KitchenMarland KitchenOnly takes morning after taking dexamethasone      . oxycodone (OXY-IR) 5 MG capsule Take 5 mg by mouth every 4 (four) hours as needed. Prescribed by [facility] MD      . oxyCODONE (OXYCONTIN) 40 MG 12 hr tablet Take 1 tablet (40 mg total) by mouth every 12 (twelve) hours.  60 tablet  0  . pomalidomide (POMALYST) 4 MG capsule Take 1 capsule (4 mg total) by mouth daily.  21 capsule  0  . senna (SENOKOT) 8.6 MG tablet Take 1 tablet by mouth daily.      . simvastatin (ZOCOR) 10 MG tablet Take 10 mg by mouth at bedtime.        . Tamsulosin HCl (FLOMAX) 0.4 MG CAPS Take 1 capsule (0.4 mg total) by mouth daily after supper.  30 capsule  2  . valsartan-hydrochlorothiazide (DIOVAN-HCT) 160-12.5 MG per tablet Take 1 tablet by mouth daily.        Marland Kitchen warfarin (COUMADIN) 5 MG tablet TAKE 1 TABLET BY MOUTH DAILY OR AS DIRECTED  50 tablet  0   No current facility-administered medications for this visit.    SURGICAL HISTORY:  Past Surgical History  Procedure Laterality Date  . Circumcision    . Hand surgery      REVIEW OF SYSTEMS:  Pertinent items are noted in HPI.   PHYSICAL EXAMINATION: General appearance: alert, cooperative and no distress Head: Normocephalic, without obvious abnormality, atraumatic Neck: no adenopathy Lymph nodes: Cervical, supraclavicular, and axillary nodes normal. Resp: clear to auscultation bilaterally Cardio: regular rate and rhythm, S1, S2 normal, no murmur, click, rub or gallop GI: soft, non-tender; bowel sounds normal; no masses,  no organomegaly Extremities: edema 2+ pitting edema right lower extremity, trace to 1+ pitting edema left lower external Neurologic: Alert and oriented X 3, normal strength and tone. Normal symmetric reflexes. Normal coordination and gait  ECOG PERFORMANCE STATUS: 1 - Symptomatic but completely ambulatory  Blood pressure 112/79, pulse 63, temperature 97.1  F (36.2 C), temperature source Oral, resp. rate 20, height 5\' 7"  (1.702 m), weight 189 lb 1.6 oz (85.775 kg).  LABORATORY DATA: Lab Results  Component Value Date   WBC 2.5* 05/18/2012   HGB 12.2* 05/18/2012   HCT 36.9* 05/18/2012   MCV 86.6 05/18/2012   PLT 239 05/18/2012      Chemistry      Component Value Date/Time   NA 139 05/18/2012 1101   NA 138 02/29/2012 0436   K 4.2 05/18/2012 1101   K 3.9 02/29/2012 0436   CL 103 05/18/2012 1101   CL 104 02/29/2012 0436   CO2 25 05/18/2012 1101   CO2 25 02/29/2012 0436   BUN 43.9* 05/18/2012 1101   BUN 17 02/29/2012 0436  CREATININE 2.1* 05/18/2012 1101   CREATININE 1.13 02/29/2012 0436   GLU 293* 08/12/2009 1322      Component Value Date/Time   CALCIUM 9.7 05/18/2012 1101   CALCIUM 9.3 02/29/2012 0436   ALKPHOS 150 05/18/2012 1101   ALKPHOS 118* 02/26/2012 0520   AST 12 05/18/2012 1101   AST 17 02/26/2012 0520   ALT 18 05/18/2012 1101   ALT 9 02/26/2012 0520   BILITOT 0.42 05/18/2012 1101   BILITOT 0.5 02/26/2012 0520       RADIOGRAPHIC STUDIES: Dg Chest 2 View  02/25/2012  *RADIOLOGY REPORT*  Clinical Data: Short of breath  CHEST - 2 VIEW  Comparison: 01/27/2012  Findings: Low lung volumes and bibasilar scarring is again demonstrated.  Normal heart size.  No effusion or edema.  No airspace consolidation.  IMPRESSION:  1.  Chronic low lung volumes with bibasilar atelectasis.   Original Report Authenticated By: Signa Kell, M.D.    Ct Chest Wo Contrast  02/25/2012  *RADIOLOGY REPORT*  Clinical Data: Hypoxia; shortness of breath.  Acute respiratory failure.  CT CHEST WITHOUT CONTRAST  Technique:  Multidetector CT imaging of the chest was performed following the standard protocol without IV contrast.  Comparison: CT of the chest performed 10/24/2011  Findings: Patchy bilateral airspace opacification is noted, favoring the upper lung lobes, though also seen within both lower lobes.  Focal density at the right lung base is thought to reflect scarring.  Previously  noted nodularity at the left upper lobe has resolved, and was likely infectious in nature.  No pleural effusion or pneumothorax is seen.  Diffuse coronary artery calcifications are seen.  The mediastinum is otherwise unremarkable in appearance.  Scattered mediastinal nodes remain normal in size.  No pericardial effusion is identified.  Mild calcific atherosclerotic disease is noted along the proximal great vessels; the great vessels are otherwise grossly unremarkable.  The thyroid gland is unremarkable in appearance.  No axillary lymphadenopathy is seen.  The visualized portions of the liver and spleen are unremarkable in appearance.  Mildly increased attenuation layering within the gallbladder may reflect sludge or tiny stones.  Nonspecific perinephric stranding is again noted bilaterally.  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  No acute osseous abnormalities are identified.  As noted on the prior study, there are compression deformities involving T5-L1, and changes of vertebroplasty are noted at L1.  These appear grossly stable from the prior study.  IMPRESSION:  1.  Patchy bilateral airspace opacification, favoring the upper lung lobes, compatible with multifocal pneumonia. 2.  Focal density at the right lung base is thought to reflect scarring; the previously noted nodule at the left upper lobe has resolved and was likely infectious in nature. 3.  Diffuse coronary artery calcifications seen. 4.  Mildly increased attenuation layering within the gallbladder may reflect sludge or tiny stones. 5.  Extensive chronic compression deformities along the thoracic and upper lumbar spine, unchanged from the prior CT.   Original Report Authenticated By: Tonia Ghent, M.D.    Nm Pulmonary Perf And Vent  02/25/2012  *RADIOLOGY REPORT*  Clinical Data: Short of breath  NM PULMONARY VENTILATION AND PERFUSION SCAN  Radiopharmaceutical: 5.7MILLI CURIE MAA  TECHNETIUM TO 81M ALBUMIN AGGREGATED  Comparison: 02/25/2012  Findings: Exam detail diminish due to low lung volumes.  The chest radiograph from today the lungs appear hypoinflated.  Within the right lung base there is a moderate sized perfusion defect with corresponding ventilation abnormality.  No unmatched segmental perfusion defects identified.  IMPRESSION:  1. Diminished exam detail due to low lung volumes.  Low probability for acute pulmonary embolus.   Original Report Authenticated By: Signa Kell, M.D.    Dg Chest Port 1 View  02/29/2012  *RADIOLOGY REPORT*  Clinical Data: Pneumonia.  PORTABLE CHEST - 1 VIEW  Comparison: Plain film of the chest 02/25/2012 and 02/27/2012. CT chest 02/25/2012.  Findings: Patchy bilateral airspace disease persists but has improved since the most recent plain film.  No pneumothorax is identified.  Heart size is normal.  There is likely a trace right pleural effusion.  IMPRESSION: Some improvement in bilateral airspace disease.   Original Report Authenticated By: Holley Dexter, M.D.    Dg Chest Port 1 View  02/27/2012  *RADIOLOGY REPORT*  Clinical Data: Pneumonia.  Acute respiratory failure with hypoxia. Neutropenia.  PORTABLE CHEST - 1 VIEW  Comparison: 02/25/2012  Findings: Low lung volumes are again demonstrated.  There is increased patchy airspace disease seen in the peripheral lung zones bilaterally.  No evidence of pleural effusion.  Heart size is stable.  IMPRESSION: Increased patchy bilateral airspace disease.   Original Report Authenticated By: Myles Rosenthal, M.D.     ASSESSMENT/PLAN: This is a very pleasant 67 years old African American male with multiple myeloma currently on treatment with Pomalyst to that has been on hold for the last few weeks secondary to her recent hospitalization with respiratory failure with pneumonia. His myeloma panel showed mild improvement in his disease. The patient is relatively well today. Patient was discussed with Dr. Arbutus Ped.   He he is neutropenic with an ANC of 1.1. He was advised to take Neupogen at 480 mcg subcutaneously daily for the next 3 days. He'll followup with Dr. Arbutus Ped in 4 weeks for another symptom management visit with a repeat CBC differential, C. met and LDH. As well as repeat quantitative immunoglobulin, beta 2 microglobulin, and serum light chains to reevaluate his disease he is encouraged to keep his point with his primary care physician and discuss his left rib cage soreness.  Quantarius Genrich E, PA-C  . He was advised to call  immediately if he has any concerning symptoms in the interval.  All questions were answered. The patient knows to call the clinic with any problems, questions or concerns. We can certainly see the patient much sooner if necessary.  I spent 20 minutes counseling the patient face to face. The total time spent in the appointment was 30 minutes.

## 2012-06-03 ENCOUNTER — Other Ambulatory Visit: Payer: Self-pay | Admitting: Internal Medicine

## 2012-06-08 ENCOUNTER — Ambulatory Visit (HOSPITAL_BASED_OUTPATIENT_CLINIC_OR_DEPARTMENT_OTHER): Payer: 59 | Admitting: Pharmacist

## 2012-06-08 ENCOUNTER — Other Ambulatory Visit (HOSPITAL_BASED_OUTPATIENT_CLINIC_OR_DEPARTMENT_OTHER): Payer: 59 | Admitting: Lab

## 2012-06-08 DIAGNOSIS — C9002 Multiple myeloma in relapse: Secondary | ICD-10-CM

## 2012-06-08 DIAGNOSIS — I82409 Acute embolism and thrombosis of unspecified deep veins of unspecified lower extremity: Secondary | ICD-10-CM

## 2012-06-08 DIAGNOSIS — I82401 Acute embolism and thrombosis of unspecified deep veins of right lower extremity: Secondary | ICD-10-CM

## 2012-06-08 LAB — CBC WITH DIFFERENTIAL/PLATELET
Basophils Absolute: 0.1 10*3/uL (ref 0.0–0.1)
Eosinophils Absolute: 0.3 10*3/uL (ref 0.0–0.5)
HGB: 10.9 g/dL — ABNORMAL LOW (ref 13.0–17.1)
LYMPH%: 24.7 % (ref 14.0–49.0)
MCV: 86.3 fL (ref 79.3–98.0)
MONO%: 19.5 % — ABNORMAL HIGH (ref 0.0–14.0)
NEUT#: 0.9 10*3/uL — ABNORMAL LOW (ref 1.5–6.5)
NEUT%: 38.9 % — ABNORMAL LOW (ref 39.0–75.0)
Platelets: 231 10*3/uL (ref 140–400)

## 2012-06-08 LAB — TECHNOLOGIST REVIEW

## 2012-06-08 LAB — COMPREHENSIVE METABOLIC PANEL (CC13)
BUN: 29.4 mg/dL — ABNORMAL HIGH (ref 7.0–26.0)
CO2: 24 mEq/L (ref 22–29)
Creatinine: 1.6 mg/dL — ABNORMAL HIGH (ref 0.7–1.3)
Glucose: 378 mg/dl — ABNORMAL HIGH (ref 70–99)
Sodium: 133 mEq/L — ABNORMAL LOW (ref 136–145)
Total Bilirubin: 0.51 mg/dL (ref 0.20–1.20)
Total Protein: 6.7 g/dL (ref 6.4–8.3)

## 2012-06-08 LAB — POCT INR: INR: 1.6

## 2012-06-08 LAB — LACTATE DEHYDROGENASE (CC13): LDH: 158 U/L (ref 125–245)

## 2012-06-08 NOTE — Progress Notes (Signed)
INR below goal today. No missed doses, no medication changes. Pt did have 2 big servings of spinach salad last night. This is not too unusual for him. No problems to report regarding anticoagulation. No s/s of clotting. Will slightly increase coumadin to 5 mg daily except 7.5mg  on TuThuSat. Recheck INR in 1 week with scheduled appts on 06/16/12; lab at 8:30am, Dr. Arbutus Ped at [facility] and coumadin clinic at 9:15am.

## 2012-06-08 NOTE — Patient Instructions (Addendum)
Slightly increase coumadin to 5 mg daily except 7.5mg  on TuThuSat. Recheck INR in 1 week with scheduled appts on 06/16/12; lab at 8:30am, Dr. Arbutus Ped at [facility] and coumadin clinic at 9:15am.

## 2012-06-10 ENCOUNTER — Other Ambulatory Visit: Payer: Self-pay | Admitting: *Deleted

## 2012-06-10 LAB — SPEP & IFE WITH QIG
Albumin ELP: 50.1 % — ABNORMAL LOW (ref 55.8–66.1)
Beta 2: 6.4 % (ref 3.2–6.5)
Beta Globulin: 5.5 % (ref 4.7–7.2)
IgA: 389 mg/dL — ABNORMAL HIGH (ref 68–379)
IgG (Immunoglobin G), Serum: 617 mg/dL — ABNORMAL LOW (ref 650–1600)
IgM, Serum: 18 mg/dL — ABNORMAL LOW (ref 41–251)
M-Spike, %: 0.21 g/dL
Total Protein, Serum Electrophoresis: 6.3 g/dL (ref 6.0–8.3)

## 2012-06-10 NOTE — Telephone Encounter (Signed)
THIS REFILL REQUEST FOR POMALYST WAS PUT IN DR.MOHAMED'S FOLDER AND PLACED ON HIS NURSE'S DESK.

## 2012-06-13 ENCOUNTER — Other Ambulatory Visit: Payer: Self-pay | Admitting: *Deleted

## 2012-06-13 DIAGNOSIS — C9000 Multiple myeloma not having achieved remission: Secondary | ICD-10-CM

## 2012-06-13 DIAGNOSIS — R52 Pain, unspecified: Secondary | ICD-10-CM

## 2012-06-13 MED ORDER — POMALIDOMIDE 4 MG PO CAPS: 4.0000 mg | ORAL_CAPSULE | Freq: Every day | ORAL | Status: DC

## 2012-06-13 MED ORDER — OXYCODONE HCL 40 MG PO TB12: 40.0000 mg | ORAL_TABLET | Freq: Two times a day (BID) | ORAL | Status: DC

## 2012-06-14 ENCOUNTER — Institutional Professional Consult (permissible substitution): Payer: Self-pay | Admitting: Internal Medicine

## 2012-06-14 NOTE — Telephone Encounter (Signed)
Notified from [employer] that patient must use employers plan. Rx faxed to [facility] at [phone]. Phone [phone].

## 2012-06-15 ENCOUNTER — Encounter: Payer: Self-pay | Admitting: Adult Health

## 2012-06-15 ENCOUNTER — Ambulatory Visit (INDEPENDENT_AMBULATORY_CARE_PROVIDER_SITE_OTHER): Payer: 59 | Admitting: Adult Health

## 2012-06-15 VITALS — BP 118/76 | HR 72 | Temp 97.6°F | Ht 65.5 in | Wt 192.2 lb

## 2012-06-15 DIAGNOSIS — I82401 Acute embolism and thrombosis of unspecified deep veins of right lower extremity: Secondary | ICD-10-CM

## 2012-06-15 DIAGNOSIS — J189 Pneumonia, unspecified organism: Secondary | ICD-10-CM

## 2012-06-15 DIAGNOSIS — I82409 Acute embolism and thrombosis of unspecified deep veins of unspecified lower extremity: Secondary | ICD-10-CM

## 2012-06-15 DIAGNOSIS — J9600 Acute respiratory failure, unspecified whether with hypoxia or hypercapnia: Secondary | ICD-10-CM

## 2012-06-15 DIAGNOSIS — J9601 Acute respiratory failure with hypoxia: Secondary | ICD-10-CM

## 2012-06-15 DIAGNOSIS — C9002 Multiple myeloma in relapse: Secondary | ICD-10-CM

## 2012-06-15 NOTE — Progress Notes (Signed)
Subjective:    Patient ID: Rodney Green, male    DOB: 21-Jun-1945, 67 y.o.   MRN: [id number]  HPI 67 year old male former smoker (01/2008) seen for initial PCCM consult during hospitalization on 02/26/2012 for multifocal pneumonia, complicated by severe hypoxia. Patient has a complicated history with multiple myeloma on chemotherapy, complicated by neutropenia. He is followed by Dr. Gwenyth Bouillon at the cancer Center. Past medical history includes diabetes, hypertension, hepatitis C, chronic kidney disease-baseline creatinine 1.8 1.9. And Anemia .  06/15/2012 Post Hospital follow up  Pt returns for pulmonary follow up from hospitalization in 02/2012. He was seen by PCCM due to 02/26/2012 for multifocal pneumonia, complicated by severe hypoxia.  He was admitted to ICU due to severe hypoxia, found to have a multifocal PNA . VQ scan low prob for PE .  He was treated with IV abx, o2 and nebs.  He was discharged on Abx and O2 due to persistent desats.  Unfortunately developed right DVT after discharge and is now on couamdin.  Over last couple of months has been getting slowly improved with decreased dyspnea . Wears O2 only as needed. Sats 96% on RA today .  Had some rib pain last month, cxr showed PNA clearance and no evidence of rib fx.  He does have a hx of smoking, no formal dx of COPD.  In past . No hx of asthma.  No hemoptysis, edema or orthopnea.  Does get winded with walking long distances.  Has intermittent dry cough at times. No discolored mucus.  Spirometry today >pirometry 06/15/12 >FEV1 2.26 L (85%), ratio 78   Review of Systems Constitutional:   No  weight loss, night sweats,  Fevers, chills,  +fatigue, or  lassitude.  HEENT:   No headaches,  Difficulty swallowing,  Tooth/dental problems, or  Sore throat,                No sneezing, itching, ear ache, nasal congestion, post nasal drip,   CV:  No chest pain,  Orthopnea, PND,  +swelling in lower extremities,  No anasarca,  dizziness, palpitations, syncope.   GI  No heartburn, indigestion, abdominal pain, nausea, vomiting, diarrhea, change in bowel habits, loss of appetite, bloody stools.   Resp: + shortness of breath with exertion  +Dry cough  No coughing up of blood.  No change in color of mucus.  No wheezing.  No chest wall deformity  Skin: no rash or lesions.  GU: no dysuria, change in color of urine, no urgency or frequency.  No flank pain, no hematuria   MS: +back pain.  Psych:  No change in mood or affect. No depression or anxiety.  No memory loss.          Objective:   Physical Exam GEN: A/Ox3; pleasant , NAD, elderly   HEENT:  [place]/AT,  EACs-clear, TMs-wnl, NOSE-clear, THROAT-clear, no lesions, no postnasal drip or exudate noted.   NECK:  Supple w/ fair ROM; no JVD; normal carotid impulses w/o bruits; no thyromegaly or nodules palpated; no lymphadenopathy.  RESP  Diminished BS in bases no accessory muscle use, no dullness to percussion  CARD:  RRR, no m/r/g  ,tr  peripheral edema, pulses intact, no cyanosis or clubbing.  GI:   Soft & nt; nml bowel sounds; no organomegaly or masses detected.  Musco: Warm bil, no  joint swelling noted.   Neuro: alert, no focal deficits noted.    Skin: Warm, no lesions or rashes   TESTS:  CT Chest 02/25/12 >multifocal  PNA  CXR 05/19/12 >NAD  Spirometry 06/15/12 >FEV1 2.26 L (85%), ratio 78. VQ 02/2012 low prob PE          Assessment & Plan:

## 2012-06-15 NOTE — Patient Instructions (Addendum)
It appears your Pneumonia has cleared on chest xray .  Your lung function despite smoking for many years looks okay .  You can follow up with our office As needed  For lung issues with Dr. Delton Coombes

## 2012-06-16 ENCOUNTER — Telehealth: Payer: Self-pay | Admitting: Internal Medicine

## 2012-06-16 ENCOUNTER — Encounter: Payer: Self-pay | Admitting: Internal Medicine

## 2012-06-16 ENCOUNTER — Other Ambulatory Visit: Payer: Self-pay | Admitting: Internal Medicine

## 2012-06-16 ENCOUNTER — Ambulatory Visit: Payer: 59 | Admitting: Pharmacist

## 2012-06-16 ENCOUNTER — Other Ambulatory Visit (HOSPITAL_BASED_OUTPATIENT_CLINIC_OR_DEPARTMENT_OTHER): Payer: 59 | Admitting: Lab

## 2012-06-16 ENCOUNTER — Ambulatory Visit (HOSPITAL_BASED_OUTPATIENT_CLINIC_OR_DEPARTMENT_OTHER): Payer: 59 | Admitting: Internal Medicine

## 2012-06-16 VITALS — BP 137/86 | HR 69 | Temp 97.3°F | Ht 65.0 in | Wt 189.9 lb

## 2012-06-16 DIAGNOSIS — C9000 Multiple myeloma not having achieved remission: Secondary | ICD-10-CM

## 2012-06-16 DIAGNOSIS — I82409 Acute embolism and thrombosis of unspecified deep veins of unspecified lower extremity: Secondary | ICD-10-CM

## 2012-06-16 DIAGNOSIS — C9002 Multiple myeloma in relapse: Secondary | ICD-10-CM

## 2012-06-16 DIAGNOSIS — D709 Neutropenia, unspecified: Secondary | ICD-10-CM

## 2012-06-16 LAB — PROTIME-INR: Protime: 20.4 Seconds — ABNORMAL HIGH (ref 10.6–13.4)

## 2012-06-16 LAB — CBC WITH DIFFERENTIAL/PLATELET
BASO%: 2.4 % — ABNORMAL HIGH (ref 0.0–2.0)
LYMPH%: 24.4 % (ref 14.0–49.0)
MCH: 28.2 pg (ref 27.2–33.4)
MCHC: 33.1 g/dL (ref 32.0–36.0)
MCV: 85.2 fL (ref 79.3–98.0)
MONO%: 28.9 % — ABNORMAL HIGH (ref 0.0–14.0)
Platelets: 389 10*3/uL (ref 140–400)
RBC: 3.9 10*6/uL — ABNORMAL LOW (ref 4.20–5.82)

## 2012-06-16 LAB — COMPREHENSIVE METABOLIC PANEL (CC13)
ALT: 10 U/L (ref 0–55)
AST: 10 U/L (ref 5–34)
Albumin: 2.9 g/dL — ABNORMAL LOW (ref 3.5–5.0)
BUN: 23.1 mg/dL (ref 7.0–26.0)
CO2: 25 mEq/L (ref 22–29)
Calcium: 9.7 mg/dL (ref 8.4–10.4)
Chloride: 103 mEq/L (ref 98–107)
Potassium: 3.8 mEq/L (ref 3.5–5.1)

## 2012-06-16 LAB — POCT INR: INR: 1.7

## 2012-06-16 LAB — LACTATE DEHYDROGENASE (CC13): LDH: 170 U/L (ref 125–245)

## 2012-06-16 MED ORDER — OXYCODONE HCL 5 MG PO CAPS: 5.0000 mg | ORAL_CAPSULE | ORAL | Status: DC | PRN

## 2012-06-16 NOTE — Patient Instructions (Addendum)
INR still slightly below goal   Plan to increase to 7.5 mg (1.5 tablets) daily except for 5 mg on Monday and Wednesdays.  Return on 06/27/12 at 11am

## 2012-06-16 NOTE — Progress Notes (Signed)
INR still remains below goal despite a slight increase in dose last week. Pt reports no missed doses or diet changes. No bruising or bleeding noted and pt does not report any swelling or pain in arms or legs. Plan is to increase dose again to 7.5 mg daily except for 5 mg on Mondays and Wednesdays. Patient will return in about a week on 06/27/12 at 11am for lab and 1115 am for coumadin clinic. If INR still remains below goal consider increasing to 7.5mg  daily

## 2012-06-16 NOTE — Assessment & Plan Note (Addendum)
Multifocal pneumonia, on CT during hospitalization. January 2014 in immunosuppressed pt (multiple myeloma-chemo)  Treated with aggressive pulmonary hygiene, IV antibiotics, and oxygen therapy. Patient has totally resolved from this episode. Chest x-ray last month, which showed total clearance of his pneumonia.

## 2012-06-16 NOTE — Assessment & Plan Note (Signed)
Severe hypoxia, secondary to multifocal pneumonia. During hospitalization. In January 2014 now resolved. In office spirometry shows no airflow obstruction. Patient does have a history of smoking. However, does not appear to have any significant COPD. Clinically, patient is improved. Patient is to follow back in, our office on an as-needed basis.

## 2012-06-16 NOTE — Patient Instructions (Addendum)
Your myeloma panel showed continuous improvement in your disease. Continue treatment with Pomalyst. Followup visit in one month

## 2012-06-16 NOTE — Progress Notes (Signed)
[facility] Telephone:(336) [phone]   Fax:([phone]  OFFICE PROGRESS NOTE  Katy Apo, MD [address] [place] [address]  DIAGNOSIS:  Multiple myeloma IgA subtype diagnosed in November, 2009.   PRIOR THERAPY:  1. Status post seven cycles of systemic chemotherapy with Revlimid and low-dose Betatron with partial response. The last dose was given June, 2010. 2. Status post chemotherapy with weekly Velcade in a patient on Decadron 40 mg on a weekly basis, status post 75 weekly doses. The last dose was given May 04, 2008. 3. Status post treatment again with Velcade and Decadron. The patient was awaiting stem cell transplant at [facility]. Last dose was given Jul 16, 2009. 4. The patient was not considered a good candidate for autologous stem cell transplant at that time secondary to poor mobilization of his stem cells.  5. Status post six cycles of systemic chemotherapy with Velcade, Cytoxan and Betatron. Last dose was given March 25, 2010. This continued secondary to his disease progression. 6. Status post ten cycles of systemic chemotherapy with Velcade. Doxil and Decadron. Last dose was given on 10/31/2010. Currently on hold because of concern about cardiac toxicity.  CURRENT THERAPY:  1. Pomalyst 4 mg by mouth daily for 21 days every 28 days, completed cycle #9 and here to start cycle #10 on 06/19/2012. 2. Decadron 40 mg by mouth weekly   INTERVAL HISTORY: Rodney Green 67 y.o. male returns to the clinic today for followup visit accompanied by his wife The patient is tolerating his current treatment with Pomalyst fairly well with no significant adverse effects. He denied having any significant fatigue or weakness. He denied having any chest pain, shortness breath, cough or hemoptysis. He has no significant weight low some night sweats and no bleeding issues. The patient has repeat myeloma panel performed recently and  he is here today for evaluation and discussion of his lab results.  MEDICAL HISTORY: Past Medical History  Diagnosis Date  . Allergy   . Diabetes mellitus   . Hypertension   . Hepatitis C   . Multiple myeloma(203.0)   . Multiple myeloma   . Chronic kidney disease     ALLERGIES:  is allergic to ace inhibitors.  MEDICATIONS:  Current Outpatient Prescriptions  Medication Sig Dispense Refill  . amLODipine (NORVASC) 5 MG tablet Take 5 mg by mouth daily.        Marland Kitchen aspirin 81 MG tablet Take 81 mg by mouth daily.      Marland Kitchen atenolol (TENORMIN) 100 MG tablet Take 100 mg by mouth daily.        . bisacodyl (BISACODYL) 5 MG EC tablet 3 tabs every other day      . dexamethasone (DECADRON) 4 MG tablet 10 tabs by mouth once a week      . diphenhydrAMINE (SOMINEX) 25 MG tablet Take 25 mg by mouth at bedtime.      . docusate sodium (COLACE) 100 MG capsule Take 2 capsules by mouth twice daily      . filgrastim (NEUPOGEN) 480 MCG/0.8ML SOLN injection Inject 0.8 mLs (480 mcg total) into the skin as directed.  4 Syringe  1  . insulin glargine (LANTUS) 100 UNIT/ML injection Inject 64 Units into the skin at bedtime.       . mineral oil external liquid by Does not apply route every other day.      Marland Kitchen NOVOLOG MIX 70/30 (70-30) 100 UNIT/ML injection Inject  2-28 Units into the skin once a week. Per sliding scale.Marland KitchenMarland KitchenOnly takes morning after taking dexamethasone      . oxycodone (OXY-IR) 5 MG capsule Take 5 mg by mouth every 4 (four) hours as needed. Prescribed by [facility] MD      . oxyCODONE (OXYCONTIN) 40 MG 12 hr tablet Take 1 tablet (40 mg total) by mouth every 12 (twelve) hours.  60 tablet  0  . pomalidomide (POMALYST) 4 MG capsule Take 1 capsule (4 mg total) by mouth daily.  21 capsule  0  . senna (SENOKOT) 8.6 MG tablet Take 3 tablets by mouth every other day.       . simvastatin (ZOCOR) 20 MG tablet Take 20 mg by mouth every evening.      . Tamsulosin HCl (FLOMAX) 0.4 MG CAPS Take 1 capsule (0.4 mg total) by  mouth daily after supper.  30 capsule  2  . TRAVATAN Z 0.004 % SOLN ophthalmic solution Place 1 drop into both eyes at bedtime.      . valsartan-hydrochlorothiazide (DIOVAN-HCT) 320-25 MG per tablet Take 1 tablet by mouth daily.      Marland Kitchen warfarin (COUMADIN) 5 MG tablet TAKE 1 TABLET BY MOUTH DAILY OR AS DIRECTED  50 tablet  0   No current facility-administered medications for this visit.    SURGICAL HISTORY:  Past Surgical History  Procedure Laterality Date  . Circumcision    . Hand surgery      REVIEW OF SYSTEMS:  A comprehensive review of systems was negative.   PHYSICAL EXAMINATION: General appearance: alert, cooperative and no distress Head: Normocephalic, without obvious abnormality, atraumatic Neck: no adenopathy Lymph nodes: Cervical, supraclavicular, and axillary nodes normal. Resp: clear to auscultation bilaterally Cardio: regular rate and rhythm, S1, S2 normal, no murmur, click, rub or gallop GI: soft, non-tender; bowel sounds normal; no masses,  no organomegaly Extremities: extremities normal, atraumatic, no cyanosis or edema Neurologic: Alert and oriented X 3, normal strength and tone. Normal symmetric reflexes. Normal coordination and gait  ECOG PERFORMANCE STATUS: 1 - Symptomatic but completely ambulatory  Blood pressure 137/86, pulse 69, temperature 97.3 F (36.3 C), temperature source Oral, height 5\' 5"  (1.651 m), weight 189 lb 14.4 oz (86.138 kg).  LABORATORY DATA: Lab Results  Component Value Date   WBC 2.8* 06/16/2012   HGB 11.0* 06/16/2012   HCT 33.2* 06/16/2012   MCV 85.2 06/16/2012   PLT 389 06/16/2012      Chemistry      Component Value Date/Time   NA 133* 06/08/2012 1012   NA 138 02/29/2012 0436   K 4.5 06/08/2012 1012   K 3.9 02/29/2012 0436   CL 100 06/08/2012 1012   CL 104 02/29/2012 0436   CO2 24 06/08/2012 1012   CO2 25 02/29/2012 0436   BUN 29.4* 06/08/2012 1012   BUN 17 02/29/2012 0436   CREATININE 1.6* 06/08/2012 1012   CREATININE 1.13 02/29/2012 0436    GLU 293* 08/12/2009 1322      Component Value Date/Time   CALCIUM 9.3 06/08/2012 1012   CALCIUM 9.3 02/29/2012 0436   ALKPHOS 129 06/08/2012 1012   ALKPHOS 118* 02/26/2012 0520   AST 9 06/08/2012 1012   AST 17 02/26/2012 0520   ALT 12 06/08/2012 1012   ALT 9 02/26/2012 0520   BILITOT 0.51 06/08/2012 1012   BILITOT 0.5 02/26/2012 0520       RADIOGRAPHIC STUDIES: No results found.  ASSESSMENT: This is a very pleasant 67 years old African American  male with IgA multiple myeloma currently on treatment with Pomalyst milligram by mouth daily for 21 days every 3 weeks status post 9 cycles and the patient will start cycle #10 on 06/19/2012. His myeloma panel showed continuous improvement in his disease.    PLAN: I discussed the lab result with the patient and his wife. I recommended for him to continue on Pomalyst as scheduled. He was advised to take Neupogen 480 mcg subcutaneously for 3 days secondary to the persistent neutropenia. He would come back for followup visit in one month for reevaluation. He was given a refill of oxycodone today. The patient was advised to call immediately if he has any concerning symptoms in the interval.  All questions were answered. The patient knows to call the clinic with any problems, questions or concerns. We can certainly see the patient much sooner if necessary.  I spent 15 minutes counseling the patient face to face. The total time spent in the appointment was 25 minutes.

## 2012-06-16 NOTE — Telephone Encounter (Signed)
gv and printed appt sched and avs for pt for May °

## 2012-06-27 ENCOUNTER — Ambulatory Visit (HOSPITAL_BASED_OUTPATIENT_CLINIC_OR_DEPARTMENT_OTHER): Payer: 59 | Admitting: Pharmacist

## 2012-06-27 ENCOUNTER — Other Ambulatory Visit (HOSPITAL_BASED_OUTPATIENT_CLINIC_OR_DEPARTMENT_OTHER): Payer: 59 | Admitting: Lab

## 2012-06-27 DIAGNOSIS — I82409 Acute embolism and thrombosis of unspecified deep veins of unspecified lower extremity: Secondary | ICD-10-CM

## 2012-06-27 DIAGNOSIS — C9002 Multiple myeloma in relapse: Secondary | ICD-10-CM

## 2012-06-27 LAB — PROTIME-INR: INR: 2.5 (ref 2.00–3.50)

## 2012-06-27 LAB — POCT INR: INR: 2.5

## 2012-06-27 NOTE — Progress Notes (Signed)
INR = 2.5 Pt has completed 3+ mos of anticoag.  Per Dr. Arbutus Ped, ok for pt to discontinue Coumadin. Pt aware that if he has s/sxs of VTE again, go to ED. Ebony Hail, Pharm.D., CPP 06/27/2012@11 :40 AM

## 2012-07-01 ENCOUNTER — Encounter: Payer: Self-pay | Admitting: Internal Medicine

## 2012-07-01 NOTE — Progress Notes (Signed)
Received letter from [facility].  They received the new Rx for Neupogen Singlejct Syr. 1 480MCG/ manufactured by [employer] quantity of 4 with 3 refills. I will forward letter to Medical records to scan in pt's chart.

## 2012-07-07 ENCOUNTER — Telehealth: Payer: Self-pay | Admitting: Internal Medicine

## 2012-07-07 NOTE — Telephone Encounter (Signed)
Patient's wife called at 9:25 pm to report that her husband started to have fever (101.5 orally) and chills about one hour ago.  She states that he had a dental procedure done earlier today.  He had a filling placed.   Later this afternoon, he bit his lip which has caused it become swollen.  He is on pomalyst 4 mg daily with one week left in this cycle.   PLAN: Given his fever along with medications which might cause myelosuppression, I instructed her to report to the nearest emergency room for further evaluation and antibiotics.  She was in agreement with this plan.

## 2012-07-08 ENCOUNTER — Other Ambulatory Visit: Payer: Self-pay | Admitting: *Deleted

## 2012-07-08 ENCOUNTER — Ambulatory Visit (HOSPITAL_BASED_OUTPATIENT_CLINIC_OR_DEPARTMENT_OTHER): Payer: 59

## 2012-07-08 ENCOUNTER — Other Ambulatory Visit: Payer: Self-pay | Admitting: Physician Assistant

## 2012-07-08 ENCOUNTER — Ambulatory Visit (HOSPITAL_BASED_OUTPATIENT_CLINIC_OR_DEPARTMENT_OTHER): Payer: 59 | Admitting: Lab

## 2012-07-08 ENCOUNTER — Telehealth: Payer: Self-pay | Admitting: *Deleted

## 2012-07-08 DIAGNOSIS — D709 Neutropenia, unspecified: Secondary | ICD-10-CM

## 2012-07-08 DIAGNOSIS — C9000 Multiple myeloma not having achieved remission: Secondary | ICD-10-CM

## 2012-07-08 DIAGNOSIS — C9002 Multiple myeloma in relapse: Secondary | ICD-10-CM

## 2012-07-08 LAB — CBC WITH DIFFERENTIAL/PLATELET
EOS%: 4.8 % (ref 0.0–7.0)
Eosinophils Absolute: 0.1 10*3/uL (ref 0.0–0.5)
HGB: 10 g/dL — ABNORMAL LOW (ref 13.0–17.1)
MCV: 88 fL (ref 79.3–98.0)
MONO%: 31.2 % — ABNORMAL HIGH (ref 0.0–14.0)
NEUT#: 0.5 10*3/uL — CL (ref 1.5–6.5)
RBC: 3.46 10*6/uL — ABNORMAL LOW (ref 4.20–5.82)
RDW: 19.4 % — ABNORMAL HIGH (ref 11.0–14.6)
WBC: 1.7 10*3/uL — ABNORMAL LOW (ref 4.0–10.3)
lymph#: 0.6 10*3/uL — ABNORMAL LOW (ref 0.9–3.3)

## 2012-07-08 LAB — LACTATE DEHYDROGENASE (CC13): LDH: 156 U/L (ref 125–245)

## 2012-07-08 LAB — COMPREHENSIVE METABOLIC PANEL (CC13)
ALT: 12 U/L (ref 0–55)
Albumin: 2.5 g/dL — ABNORMAL LOW (ref 3.5–5.0)
Alkaline Phosphatase: 111 U/L (ref 40–150)
CO2: 21 mEq/L — ABNORMAL LOW (ref 22–29)
Potassium: 4.5 mEq/L (ref 3.5–5.1)
Sodium: 136 mEq/L (ref 136–145)
Total Bilirubin: 0.65 mg/dL (ref 0.20–1.20)
Total Protein: 6.4 g/dL (ref 6.4–8.3)

## 2012-07-08 MED ORDER — FILGRASTIM 480 MCG/0.8ML IJ SOLN
480.0000 ug | Freq: Once | INTRAMUSCULAR | Status: AC
  Administered 2012-07-08: 480 ug via SUBCUTANEOUS
  Filled 2012-07-08: qty 0.8

## 2012-07-08 MED ORDER — AMOXICILLIN-POT CLAVULANATE 875-125 MG PO TABS: 1.0000 | ORAL_TABLET | Freq: Two times a day (BID) | ORAL | Status: DC

## 2012-07-08 NOTE — Telephone Encounter (Signed)
Pt's wife called stating that pt had dental procedure done yesterday and he bit his lip while his lips were still numb.  She feels that it is infected.  He developed a fever of 101.9 last night, called the on call MD and he advised she go to the ED, but he did not want to.  She is calling today to see if Rodney Green can see the patient.  Per Dr Donnald Garre and Forde Radon, pt can come in for lab appt but they need to contact the dentist regarding him biting his lip.  Hope stated she feels that they would not do anything for him.  Lab appt made for today, results will be assessed and determined whether he is neutropenic.  SLJ

## 2012-07-08 NOTE — Telephone Encounter (Signed)
Pt in lobby waiting for lab results.  ANC 0.5, WBC 1.7.  Per AJ, pt needs neupogen x 3 days.  Lip assessed in lobby.  Left side of lip is very swollen and is red and inflamed where he bit his lip.  Pt is requesting an antibiotic because he feels it is infected.  Per AJ, okay to call in augmentin 875mg  BID x 7 days.  Pt only has 2 neupogen injections at home, 1 injection given at [facility] today, and pt will take 1 injection Saturday and one on Sunday.  SLJ

## 2012-07-08 NOTE — Progress Notes (Signed)
Quick Note:  Call patient with the result and order Neupogen 480 mcg SQ x 3 days. ______

## 2012-07-08 NOTE — Addendum Note (Signed)
Addended by: Caren Griffins on: 07/08/2012 12:40 PM   Modules accepted: Orders

## 2012-07-09 ENCOUNTER — Telehealth: Payer: Self-pay | Admitting: *Deleted

## 2012-07-09 NOTE — Telephone Encounter (Signed)
Message copied by Caren Griffins on Sat Jul 09, 2012  8:09 AM ------      Message from: Si Gaul      Created: Fri Jul 08, 2012 10:48 PM       Call patient with the result and order Neupogen 480 mcg SQ x 3 days. ------

## 2012-07-09 NOTE — Telephone Encounter (Signed)
Pt is aware of 3 days of neupogen.  SLJ

## 2012-07-11 ENCOUNTER — Ambulatory Visit: Payer: Self-pay

## 2012-07-14 ENCOUNTER — Other Ambulatory Visit (HOSPITAL_BASED_OUTPATIENT_CLINIC_OR_DEPARTMENT_OTHER): Payer: 59 | Admitting: Lab

## 2012-07-14 ENCOUNTER — Telehealth: Payer: Self-pay | Admitting: Internal Medicine

## 2012-07-14 ENCOUNTER — Encounter: Payer: Self-pay | Admitting: Internal Medicine

## 2012-07-14 ENCOUNTER — Ambulatory Visit (HOSPITAL_BASED_OUTPATIENT_CLINIC_OR_DEPARTMENT_OTHER): Payer: 59 | Admitting: Internal Medicine

## 2012-07-14 VITALS — BP 113/73 | HR 67 | Temp 97.0°F | Resp 18 | Ht 65.5 in | Wt 186.3 lb

## 2012-07-14 DIAGNOSIS — R5381 Other malaise: Secondary | ICD-10-CM

## 2012-07-14 DIAGNOSIS — C9000 Multiple myeloma not having achieved remission: Secondary | ICD-10-CM

## 2012-07-14 DIAGNOSIS — C9002 Multiple myeloma in relapse: Secondary | ICD-10-CM

## 2012-07-14 DIAGNOSIS — R52 Pain, unspecified: Secondary | ICD-10-CM

## 2012-07-14 DIAGNOSIS — R5383 Other fatigue: Secondary | ICD-10-CM

## 2012-07-14 LAB — CBC WITH DIFFERENTIAL/PLATELET
BASO%: 1.5 % (ref 0.0–2.0)
EOS%: 3.6 % (ref 0.0–7.0)
HCT: 32.3 % — ABNORMAL LOW (ref 38.4–49.9)
LYMPH%: 22.4 % (ref 14.0–49.0)
MCH: 28.1 pg (ref 27.2–33.4)
MCHC: 31.8 g/dL — ABNORMAL LOW (ref 32.0–36.0)
MCV: 88.4 fL (ref 79.3–98.0)
MONO%: 23.4 % — ABNORMAL HIGH (ref 0.0–14.0)
NEUT%: 49.1 % (ref 39.0–75.0)
Platelets: 249 10*3/uL (ref 140–400)
RBC: 3.65 10*6/uL — ABNORMAL LOW (ref 4.20–5.82)
WBC: 3 10*3/uL — ABNORMAL LOW (ref 4.0–10.3)

## 2012-07-14 LAB — LACTATE DEHYDROGENASE (CC13): LDH: 136 U/L (ref 125–245)

## 2012-07-14 LAB — COMPREHENSIVE METABOLIC PANEL (CC13)
ALT: 12 U/L (ref 0–55)
Alkaline Phosphatase: 127 U/L (ref 40–150)
Creatinine: 1.6 mg/dL — ABNORMAL HIGH (ref 0.7–1.3)
Sodium: 137 mEq/L (ref 136–145)
Total Bilirubin: 0.53 mg/dL (ref 0.20–1.20)
Total Protein: 6.7 g/dL (ref 6.4–8.3)

## 2012-07-14 MED ORDER — OXYCODONE HCL 40 MG PO TB12: 40.0000 mg | ORAL_TABLET | Freq: Two times a day (BID) | ORAL | Status: DC

## 2012-07-14 NOTE — Telephone Encounter (Signed)
Gave pt appt for june 2014 lab and  MD

## 2012-07-14 NOTE — Progress Notes (Signed)
[facility] Telephone:(336) [phone]   Fax:(336) [phone]  OFFICE PROGRESS NOTE  Katy Apo, MD [address] [place] [address]  DIAGNOSIS:  Multiple myeloma IgA subtype diagnosed in November, 2009.   PRIOR THERAPY:  1. Status post seven cycles of systemic chemotherapy with Revlimid and low-dose Betatron with partial response. The last dose was given June, 2010. 2. Status post chemotherapy with weekly Velcade in a patient on Decadron 40 mg on a weekly basis, status post 75 weekly doses. The last dose was given May 04, 2008. 3. Status post treatment again with Velcade and Decadron. The patient was awaiting stem cell transplant at [facility]. Last dose was given Jul 16, 2009. 4. The patient was not considered a good candidate for autologous stem cell transplant at that time secondary to poor mobilization of his stem cells.  5. Status post six cycles of systemic chemotherapy with Velcade, Cytoxan and Betatron. Last dose was given March 25, 2010. This continued secondary to his disease progression. 6. Status post ten cycles of systemic chemotherapy with Velcade. Doxil and Decadron. Last dose was given on 10/31/2010. Currently on hold because of concern about cardiac toxicity.  CURRENT THERAPY:  1. Pomalyst 4 mg by mouth daily for 21 days every 28 days, completed cycle #9 and here to start cycle #11 on 07/17/2012. 2. Decadron 40 mg by mouth weekly   INTERVAL HISTORY: Rodney Green 67 y.o. male returns to the clinic today for followup visit. The patient is feeling fine today with no specific complaints except for occasional fatigue. He denied having any significant nausea or vomiting. He denied having any chest pain, shortness breath, cough or hemoptysis. The patient had neutropenia after the last cycle of his treatment and he required 2 days of Neupogen treatment. He denied having any bleeding issues.  MEDICAL HISTORY: Past  Medical History  Diagnosis Date  . Allergy   . Diabetes mellitus   . Hypertension   . Hepatitis C   . Multiple myeloma(203.0)   . Multiple myeloma   . Chronic kidney disease     ALLERGIES:  is allergic to ace inhibitors.  MEDICATIONS:  Current Outpatient Prescriptions  Medication Sig Dispense Refill  . amLODipine (NORVASC) 5 MG tablet Take 5 mg by mouth daily.        Marland Kitchen amoxicillin-clavulanate (AUGMENTIN) 875-125 MG per tablet Take 1 tablet by mouth 2 (two) times daily.  14 tablet  0  . aspirin 81 MG tablet Take 81 mg by mouth daily.      Marland Kitchen atenolol (TENORMIN) 100 MG tablet Take 100 mg by mouth daily.        . bisacodyl (BISACODYL) 5 MG EC tablet 3 tabs every other day      . dexamethasone (DECADRON) 4 MG tablet TAKE 10 TABLETS (40 MG) ONCE WEEKLY  130 tablet  0  . docusate sodium (COLACE) 100 MG capsule Take 2 capsules by mouth twice daily      . HUMALOG 100 UNIT/ML injection Sliding scale      . insulin glargine (LANTUS) 100 UNIT/ML injection Inject 64 Units into the skin at bedtime.       . mineral oil external liquid by Does not apply route every other day.      Marland Kitchen oxycodone (OXY-IR) 5 MG capsule Take 1 capsule (5 mg total) by mouth every 4 (four) hours as needed.  30 capsule  0  . oxyCODONE (OXYCONTIN) 40 MG  12 hr tablet Take 1 tablet (40 mg total) by mouth every 12 (twelve) hours.  60 tablet  0  . pomalidomide (POMALYST) 4 MG capsule Take 1 capsule (4 mg total) by mouth daily.  21 capsule  0  . senna (SENOKOT) 8.6 MG tablet Take 3 tablets by mouth every other day.       . simvastatin (ZOCOR) 20 MG tablet Take 20 mg by mouth every evening.      . Tamsulosin HCl (FLOMAX) 0.4 MG CAPS Take 1 capsule (0.4 mg total) by mouth daily after supper.  30 capsule  2  . TRAVATAN Z 0.004 % SOLN ophthalmic solution Place 1 drop into both eyes at bedtime.      . valsartan-hydrochlorothiazide (DIOVAN-HCT) 320-25 MG per tablet Take 1 tablet by mouth daily.      . diphenhydrAMINE (SOMINEX) 25 MG  tablet Take 25 mg by mouth at bedtime.      . filgrastim (NEUPOGEN) 480 MCG/0.8ML SOLN injection Inject 0.8 mLs (480 mcg total) into the skin as directed.  4 Syringe  1   No current facility-administered medications for this visit.    SURGICAL HISTORY:  Past Surgical History  Procedure Laterality Date  . Circumcision    . Hand surgery      REVIEW OF SYSTEMS:  A comprehensive review of systems was negative except for: Constitutional: positive for fatigue   PHYSICAL EXAMINATION: General appearance: alert, cooperative, fatigued and no distress Head: Normocephalic, without obvious abnormality, atraumatic Neck: no adenopathy Lymph nodes: Cervical, supraclavicular, and axillary nodes normal. Resp: clear to auscultation bilaterally Cardio: regular rate and rhythm, S1, S2 normal, no murmur, click, rub or gallop GI: soft, non-tender; bowel sounds normal; no masses,  no organomegaly Extremities: extremities normal, atraumatic, no cyanosis or edema  ECOG PERFORMANCE STATUS: 1 - Symptomatic but completely ambulatory  Blood pressure 113/73, pulse 67, temperature 97 F (36.1 C), temperature source Oral, resp. rate 18, height 5' 5.5" (1.664 m), weight 186 lb 4.8 oz (84.505 kg).  LABORATORY DATA: Lab Results  Component Value Date   WBC 3.0* 07/14/2012   HGB 10.3* 07/14/2012   HCT 32.3* 07/14/2012   MCV 88.4 07/14/2012   PLT 249 07/14/2012      Chemistry      Component Value Date/Time   NA 136 07/08/2012 1156   NA 138 02/29/2012 0436   K 4.5 07/08/2012 1156   K 3.9 02/29/2012 0436   CL 102 07/08/2012 1156   CL 104 02/29/2012 0436   CO2 21* 07/08/2012 1156   CO2 25 02/29/2012 0436   BUN 32.2* 07/08/2012 1156   BUN 17 02/29/2012 0436   CREATININE 1.6* 07/08/2012 1156   CREATININE 1.13 02/29/2012 0436   GLU 293* 08/12/2009 1322      Component Value Date/Time   CALCIUM 9.3 07/08/2012 1156   CALCIUM 9.3 02/29/2012 0436   ALKPHOS 111 07/08/2012 1156   ALKPHOS 118* 02/26/2012 0520   AST 9 07/08/2012 1156    AST 17 02/26/2012 0520   ALT 12 07/08/2012 1156   ALT 9 02/26/2012 0520   BILITOT 0.65 07/08/2012 1156   BILITOT 0.5 02/26/2012 0520       RADIOGRAPHIC STUDIES: No results found.  ASSESSMENT: this is a very pleasant 67 years old [place] American male with history of multiple myeloma currently undergoing systemic therapy with Pomalyst and Decadron status post 10 cycles.   PLAN: the patient is tolerating his treatment fairly well with no significant adverse effects except for mild fatigue.  He was started cycle #11. Next week. He would come back for follow up visit in one month's for reevaluation with repeat CBC, comprehensive metabolic panel and LDH. The patient was advised to call immediately if he has any concerning symptoms in the interval.  All questions were answered. The patient knows to call the clinic with any problems, questions or concerns. We can certainly see the patient much sooner if necessary.

## 2012-07-15 NOTE — Patient Instructions (Signed)
Continue treatment with Pomalyst and Decadron. Follow up visit in one month

## 2012-07-19 ENCOUNTER — Telehealth: Payer: Self-pay | Admitting: Medical Oncology

## 2012-07-19 DIAGNOSIS — C9000 Multiple myeloma not having achieved remission: Secondary | ICD-10-CM

## 2012-07-19 MED ORDER — POMALIDOMIDE 4 MG PO CAPS: 4.0000 mg | ORAL_CAPSULE | Freq: Every day | ORAL | Status: DC

## 2012-07-19 NOTE — Telephone Encounter (Signed)
Refill request given to Dr Arbutus Ped for authorization.

## 2012-07-20 ENCOUNTER — Other Ambulatory Visit: Payer: Self-pay | Admitting: *Deleted

## 2012-07-20 ENCOUNTER — Telehealth: Payer: Self-pay | Admitting: Medical Oncology

## 2012-07-20 NOTE — Telephone Encounter (Signed)
Faxed polamyst refill

## 2012-07-20 NOTE — Telephone Encounter (Signed)
Refill request for neupogen 480 mcg to MD for approval.

## 2012-08-08 ENCOUNTER — Ambulatory Visit: Payer: 59 | Admitting: Oncology

## 2012-08-08 ENCOUNTER — Other Ambulatory Visit: Payer: 59 | Admitting: Lab

## 2012-08-12 ENCOUNTER — Telehealth: Payer: Self-pay | Admitting: Adult Health

## 2012-08-12 MED ORDER — AZITHROMYCIN 250 MG PO TABS: ORAL_TABLET | ORAL | Status: DC

## 2012-08-12 NOTE — Telephone Encounter (Signed)
Pt c/o cough, chest congestion, runny nose, PND x 1 day. Pt due for last chemo today and wife is requesting for abx to have on hold as she works 24 hour shifts on the weekend. Pt denies any wheezing, chest tightness, or increased SOB. Pt has c/o having body aches and sweats, but has not had a fever. Pt has been in bed for most of today. Please advise. Carron Curie, CMA  [facility]  Allergies  Allergen Reactions  . Ace Inhibitors     He is not sure about allergy to ace inhibitors

## 2012-08-12 NOTE — Telephone Encounter (Signed)
zpack #1 take as directed , no refills  To have on hold if symptoms worse w/ discolored mucus.  Please contact office for sooner follow up if symptoms do not improve or worsen or seek emergency care  Ov if not improving  Use claritin and saline nasal rinses As needed

## 2012-08-12 NOTE — Telephone Encounter (Signed)
Pt aware and rx sent. Tareek Sabo, CMA  

## 2012-08-14 ENCOUNTER — Encounter (HOSPITAL_COMMUNITY): Payer: Self-pay

## 2012-08-14 ENCOUNTER — Emergency Department (HOSPITAL_COMMUNITY): Payer: 59

## 2012-08-14 ENCOUNTER — Inpatient Hospital Stay (HOSPITAL_COMMUNITY)
Admission: EM | Admit: 2012-08-14 | Discharge: 2012-08-22 | DRG: 871 | Disposition: A | Discharge status: Home / Self Care | Payer: 59 | Attending: Critical Care Medicine | Admitting: Critical Care Medicine

## 2012-08-14 DIAGNOSIS — R6521 Severe sepsis with septic shock: Secondary | ICD-10-CM | POA: Diagnosis present

## 2012-08-14 DIAGNOSIS — J9601 Acute respiratory failure with hypoxia: Secondary | ICD-10-CM | POA: Diagnosis present

## 2012-08-14 DIAGNOSIS — D899 Disorder involving the immune mechanism, unspecified: Secondary | ICD-10-CM | POA: Diagnosis present

## 2012-08-14 DIAGNOSIS — Z79899 Other long term (current) drug therapy: Secondary | ICD-10-CM

## 2012-08-14 DIAGNOSIS — R6520 Severe sepsis without septic shock: Secondary | ICD-10-CM | POA: Diagnosis present

## 2012-08-14 DIAGNOSIS — E119 Type 2 diabetes mellitus without complications: Secondary | ICD-10-CM | POA: Diagnosis present

## 2012-08-14 DIAGNOSIS — E785 Hyperlipidemia, unspecified: Secondary | ICD-10-CM

## 2012-08-14 DIAGNOSIS — N179 Acute kidney failure, unspecified: Secondary | ICD-10-CM | POA: Diagnosis not present

## 2012-08-14 DIAGNOSIS — E1129 Type 2 diabetes mellitus with other diabetic kidney complication: Secondary | ICD-10-CM | POA: Diagnosis present

## 2012-08-14 DIAGNOSIS — R0981 Nasal congestion: Secondary | ICD-10-CM

## 2012-08-14 DIAGNOSIS — R680 Hypothermia, not associated with low environmental temperature: Secondary | ICD-10-CM | POA: Diagnosis present

## 2012-08-14 DIAGNOSIS — J9600 Acute respiratory failure, unspecified whether with hypoxia or hypercapnia: Secondary | ICD-10-CM | POA: Diagnosis present

## 2012-08-14 DIAGNOSIS — A419 Sepsis, unspecified organism: Secondary | ICD-10-CM | POA: Diagnosis present

## 2012-08-14 DIAGNOSIS — J189 Pneumonia, unspecified organism: Secondary | ICD-10-CM | POA: Diagnosis present

## 2012-08-14 DIAGNOSIS — I10 Essential (primary) hypertension: Secondary | ICD-10-CM

## 2012-08-14 DIAGNOSIS — Z9221 Personal history of antineoplastic chemotherapy: Secondary | ICD-10-CM

## 2012-08-14 DIAGNOSIS — Z87891 Personal history of nicotine dependence: Secondary | ICD-10-CM

## 2012-08-14 DIAGNOSIS — E46 Unspecified protein-calorie malnutrition: Secondary | ICD-10-CM | POA: Diagnosis present

## 2012-08-14 DIAGNOSIS — R339 Retention of urine, unspecified: Secondary | ICD-10-CM

## 2012-08-14 DIAGNOSIS — Z794 Long term (current) use of insulin: Secondary | ICD-10-CM

## 2012-08-14 DIAGNOSIS — R0602 Shortness of breath: Secondary | ICD-10-CM

## 2012-08-14 DIAGNOSIS — I455 Other specified heart block: Secondary | ICD-10-CM

## 2012-08-14 DIAGNOSIS — J122 Parainfluenza virus pneumonia: Secondary | ICD-10-CM | POA: Diagnosis present

## 2012-08-14 DIAGNOSIS — C9002 Multiple myeloma in relapse: Secondary | ICD-10-CM | POA: Diagnosis present

## 2012-08-14 DIAGNOSIS — B1920 Unspecified viral hepatitis C without hepatic coma: Secondary | ICD-10-CM | POA: Diagnosis present

## 2012-08-14 DIAGNOSIS — R4182 Altered mental status, unspecified: Secondary | ICD-10-CM

## 2012-08-14 DIAGNOSIS — Z6828 Body mass index (BMI) 28.0-28.9, adult: Secondary | ICD-10-CM

## 2012-08-14 DIAGNOSIS — I129 Hypertensive chronic kidney disease with stage 1 through stage 4 chronic kidney disease, or unspecified chronic kidney disease: Secondary | ICD-10-CM | POA: Diagnosis present

## 2012-08-14 DIAGNOSIS — I498 Other specified cardiac arrhythmias: Secondary | ICD-10-CM | POA: Diagnosis not present

## 2012-08-14 DIAGNOSIS — Z7982 Long term (current) use of aspirin: Secondary | ICD-10-CM

## 2012-08-14 DIAGNOSIS — A4152 Sepsis due to Pseudomonas: Principal | ICD-10-CM | POA: Diagnosis present

## 2012-08-14 DIAGNOSIS — D709 Neutropenia, unspecified: Secondary | ICD-10-CM | POA: Diagnosis present

## 2012-08-14 DIAGNOSIS — Z8701 Personal history of pneumonia (recurrent): Secondary | ICD-10-CM

## 2012-08-14 DIAGNOSIS — N390 Urinary tract infection, site not specified: Secondary | ICD-10-CM | POA: Diagnosis present

## 2012-08-14 DIAGNOSIS — J1100 Influenza due to unidentified influenza virus with unspecified type of pneumonia: Secondary | ICD-10-CM | POA: Diagnosis present

## 2012-08-14 DIAGNOSIS — D849 Immunodeficiency, unspecified: Secondary | ICD-10-CM | POA: Diagnosis present

## 2012-08-14 DIAGNOSIS — D649 Anemia, unspecified: Secondary | ICD-10-CM | POA: Diagnosis present

## 2012-08-14 DIAGNOSIS — N183 Chronic kidney disease, stage 3 unspecified: Secondary | ICD-10-CM | POA: Diagnosis present

## 2012-08-14 DIAGNOSIS — E871 Hypo-osmolality and hyponatremia: Secondary | ICD-10-CM

## 2012-08-14 HISTORY — DX: Shortness of breath: R06.02

## 2012-08-14 LAB — URINE MICROSCOPIC-ADD ON

## 2012-08-14 LAB — CBC WITH DIFFERENTIAL/PLATELET
Basophils Absolute: 0 10*3/uL (ref 0.0–0.1)
Eosinophils Relative: 1 % (ref 0–5)
HCT: 25.3 % — ABNORMAL LOW (ref 39.0–52.0)
Lymphs Abs: 0.2 10*3/uL — ABNORMAL LOW (ref 0.7–4.0)
MCH: 28 pg (ref 26.0–34.0)
MCV: 87.5 fL (ref 78.0–100.0)
Monocytes Absolute: 0.5 10*3/uL (ref 0.1–1.0)
Monocytes Relative: 39 % — ABNORMAL HIGH (ref 3–12)
Neutro Abs: 0.7 10*3/uL — ABNORMAL LOW (ref 1.7–7.7)
RDW: 15.9 % — ABNORMAL HIGH (ref 11.5–15.5)
WBC: 1.4 10*3/uL — CL (ref 4.0–10.5)

## 2012-08-14 LAB — URINALYSIS, ROUTINE W REFLEX MICROSCOPIC
Leukocytes, UA: NEGATIVE
Nitrite: NEGATIVE
Specific Gravity, Urine: 1.025 (ref 1.005–1.030)
pH: 5 (ref 5.0–8.0)

## 2012-08-14 LAB — GLUCOSE, CAPILLARY: Glucose-Capillary: 350 mg/dL — ABNORMAL HIGH (ref 70–99)

## 2012-08-14 LAB — BASIC METABOLIC PANEL
BUN: 78 mg/dL — ABNORMAL HIGH (ref 6–23)
Chloride: 97 mEq/L (ref 96–112)
Creatinine, Ser: 3.35 mg/dL — ABNORMAL HIGH (ref 0.50–1.35)
Glucose, Bld: 281 mg/dL — ABNORMAL HIGH (ref 70–99)
Potassium: 5.2 mEq/L — ABNORMAL HIGH (ref 3.5–5.1)

## 2012-08-14 LAB — PROCALCITONIN: Procalcitonin: 1.05 ng/mL

## 2012-08-14 LAB — PRO B NATRIURETIC PEPTIDE: Pro B Natriuretic peptide (BNP): 3290 pg/mL — ABNORMAL HIGH (ref 0–125)

## 2012-08-14 LAB — CG4 I-STAT (LACTIC ACID): Lactic Acid, Venous: 1.16 mmol/L (ref 0.5–2.2)

## 2012-08-14 LAB — MRSA PCR SCREENING: MRSA by PCR: NEGATIVE

## 2012-08-14 LAB — EXPECTORATED SPUTUM ASSESSMENT W GRAM STAIN, RFLX TO RESP C

## 2012-08-14 MED ORDER — BIOTENE DRY MOUTH MT LIQD
15.0000 mL | Freq: Two times a day (BID) | OROMUCOSAL | Status: DC
  Administered 2012-08-16 – 2012-08-19 (×8): 15 mL via OROMUCOSAL

## 2012-08-14 MED ORDER — PANTOPRAZOLE SODIUM 40 MG PO TBEC
40.0000 mg | DELAYED_RELEASE_TABLET | Freq: Every day | ORAL | Status: DC
  Administered 2012-08-14 – 2012-08-15 (×2): 40 mg via ORAL
  Filled 2012-08-14 (×3): qty 1

## 2012-08-14 MED ORDER — ACETAMINOPHEN 325 MG PO TABS
650.0000 mg | ORAL_TABLET | Freq: Four times a day (QID) | ORAL | Status: DC | PRN
  Administered 2012-08-14 – 2012-08-15 (×2): 650 mg via ORAL
  Filled 2012-08-14: qty 2

## 2012-08-14 MED ORDER — ALBUTEROL SULFATE (5 MG/ML) 0.5% IN NEBU
2.5000 mg | INHALATION_SOLUTION | Freq: Four times a day (QID) | RESPIRATORY_TRACT | Status: DC
  Administered 2012-08-14 – 2012-08-18 (×16): 2.5 mg via RESPIRATORY_TRACT
  Filled 2012-08-14 (×17): qty 0.5

## 2012-08-14 MED ORDER — HEPARIN SODIUM (PORCINE) 5000 UNIT/ML IJ SOLN
5000.0000 [IU] | Freq: Three times a day (TID) | INTRAMUSCULAR | Status: DC
  Administered 2012-08-14 – 2012-08-22 (×24): 5000 [IU] via SUBCUTANEOUS
  Filled 2012-08-14 (×27): qty 1

## 2012-08-14 MED ORDER — VANCOMYCIN HCL IN DEXTROSE 1-5 GM/200ML-% IV SOLN
1000.0000 mg | INTRAVENOUS | Status: DC
  Administered 2012-08-14 – 2012-08-15 (×2): 1000 mg via INTRAVENOUS
  Filled 2012-08-14 (×3): qty 200

## 2012-08-14 MED ORDER — ACETAMINOPHEN 650 MG RE SUPP
650.0000 mg | Freq: Once | RECTAL | Status: AC
  Administered 2012-08-14: 650 mg via RECTAL
  Filled 2012-08-14: qty 1

## 2012-08-14 MED ORDER — PIPERACILLIN-TAZOBACTAM 3.375 G IVPB
3.3750 g | Freq: Three times a day (TID) | INTRAVENOUS | Status: DC
  Administered 2012-08-14 – 2012-08-18 (×12): 3.375 g via INTRAVENOUS
  Filled 2012-08-14 (×14): qty 50

## 2012-08-14 MED ORDER — DEXTROSE 5 % IV SOLN
500.0000 mg | INTRAVENOUS | Status: DC
  Administered 2012-08-14 – 2012-08-16 (×3): 500 mg via INTRAVENOUS
  Filled 2012-08-14 (×3): qty 500

## 2012-08-14 MED ORDER — CHLORHEXIDINE GLUCONATE 0.12 % MT SOLN
15.0000 mL | Freq: Two times a day (BID) | OROMUCOSAL | Status: DC
  Administered 2012-08-14 – 2012-08-17 (×7): 15 mL via OROMUCOSAL
  Filled 2012-08-14 (×7): qty 15

## 2012-08-14 MED ORDER — SODIUM CHLORIDE 0.9 % IV SOLN
1000.0000 mL | Freq: Once | INTRAVENOUS | Status: AC
  Administered 2012-08-14: 1000 mL via INTRAVENOUS

## 2012-08-14 MED ORDER — ATENOLOL 100 MG PO TABS
100.0000 mg | ORAL_TABLET | Freq: Every day | ORAL | Status: DC
  Administered 2012-08-14 – 2012-08-15 (×2): 100 mg via ORAL
  Filled 2012-08-14 (×3): qty 1

## 2012-08-14 MED ORDER — SODIUM CHLORIDE 0.9 % IV SOLN
1000.0000 mL | INTRAVENOUS | Status: DC
  Administered 2012-08-14: 1000 mL via INTRAVENOUS

## 2012-08-14 MED ORDER — SODIUM CHLORIDE 0.9 % IV SOLN
INTRAVENOUS | Status: DC
  Administered 2012-08-14: 12:00:00 via INTRAVENOUS
  Administered 2012-08-15 – 2012-08-16 (×4): 500 mL via INTRAVENOUS

## 2012-08-14 MED ORDER — TRAVOPROST (BAK FREE) 0.004 % OP SOLN
1.0000 [drp] | Freq: Every day | OPHTHALMIC | Status: DC
  Administered 2012-08-14 – 2012-08-21 (×7): 1 [drp] via OPHTHALMIC
  Filled 2012-08-14 (×4): qty 2.5

## 2012-08-14 MED ORDER — SENNA 8.6 MG PO TABS
3.0000 | ORAL_TABLET | ORAL | Status: DC
  Administered 2012-08-14: 25.8 mg via ORAL
  Filled 2012-08-14: qty 2
  Filled 2012-08-14: qty 1

## 2012-08-14 MED ORDER — SODIUM CHLORIDE 0.9 % IV SOLN
INTRAVENOUS | Status: DC
  Administered 2012-08-14: 3.8 [IU]/h via INTRAVENOUS
  Administered 2012-08-15: 11.3 [IU]/h via INTRAVENOUS
  Filled 2012-08-14 (×2): qty 1

## 2012-08-14 MED ORDER — INSULIN ASPART 100 UNIT/ML ~~LOC~~ SOLN
2.0000 [IU] | SUBCUTANEOUS | Status: DC
  Administered 2012-08-14: 4 [IU] via SUBCUTANEOUS

## 2012-08-14 MED ORDER — ASPIRIN 81 MG PO CHEW
81.0000 mg | CHEWABLE_TABLET | Freq: Every day | ORAL | Status: DC
  Administered 2012-08-14 – 2012-08-22 (×8): 81 mg via ORAL
  Filled 2012-08-14 (×9): qty 1

## 2012-08-14 MED ORDER — ACETAMINOPHEN 325 MG PO TABS
ORAL_TABLET | ORAL | Status: AC
  Filled 2012-08-14: qty 2

## 2012-08-14 NOTE — H&P (Addendum)
PULMONARY  / CRITICAL CARE MEDICINE  Name: Rodney Green MRN: [id number] DOB: March 15, 1945    ADMISSION DATE:  08/14/2012 CONSULTATION DATE:  08/14/2012   REFERRING MD :  EDP PRIMARY SERVICE: PCCM  CHIEF COMPLAINT:  Hypotension and fever  BRIEF PATIENT DESCRIPTION: This a 67 year old African American male with refractory multiple myeloma IgA subtype admitted with neutropenia and bilateral atypical pneumonia with hypoxemia and septic shock. Critical care was asked to admit  SIGNIFICANT EVENTS / STUDIES:    LINES / TUBES: PIV  CULTURES: Blood cultures x2 08/14/2012 Urine culture 08/14/2012 Viral culture 08/14/2012 Sputum culture 08/14/2012  ANTIBIOTICS: Zosyn 08/14/2012 Vancomycin 08/14/2012 Azithromycin 08/14/2012  HISTORY OF PRESENT ILLNESS:  This is a 67 year old African American male diagnosed with myeloma IgA subtype 2009. The patient has had multiple rounds of different forms of chemotherapy over the past 5 years to include as follows:  PRIOR THERAPY:  1. Status post seven cycles of systemic chemotherapy with Revlimid and low-dose Betatron with partial response. The last dose was given June, 2010. 2. Status post chemotherapy with weekly Velcade in a patient on Decadron 40 mg on a weekly basis, status post 75 weekly doses. The last dose was given May 04, 2008. 3. Status post treatment again with Velcade and Decadron. The patient was awaiting stem cell transplant at [facility]. Last dose was given Jul 16, 2009. 4. The patient was not considered a good candidate for autologous stem cell transplant at that time secondary to poor mobilization of his stem cells.  5. Status post six cycles of systemic chemotherapy with Velcade, Cytoxan and Betatron. Last dose was given March 25, 2010. This continued secondary to his disease progression. 6. Status post ten cycles of systemic chemotherapy with Velcade. Doxil and Decadron. Last dose was given on 10/31/2010.  Currently on hold because of concern about cardiac toxicity. CURRENT THERAPY:  1. Pomalyst 4 mg by mouth daily for 21 days every 28 days, completed cycle #9 and here to start cycle #11 on 07/17/2012. 2. Decadron 40 mg by mouth weekly   The patient currently is on the oral, Pomalyst  therapy and has been supported with Neupogen because of neutropenia. The patient noted the last 3 days increasing shortness of breath and cough. The cough is productive of thick yellow mucus. The patient developed increasing weakness and on this basis was admitted to the emergency room by EMS. Blood pressure was found to be low. The patient is currently in a phase I sepsis protocol and critical care was asked to admit. Note chest x-ray shows bilateral infiltrates. Note patient has neutropenia and now has been previously admitted with neutropenic pneumonia in the past January 2014.  The patient notes diffuse bone pain, weakness, and malaise. The patient also notes significant fatigue. There is no hemoptysis. The patient is a current nonsmoker.  PAST MEDICAL HISTORY :  Past Medical History  Diagnosis Date  . Allergy   . Diabetes mellitus   . Hypertension   . Hepatitis C   . Multiple myeloma   . Multiple myeloma   . Chronic kidney disease    Past Surgical History  Procedure Laterality Date  . Circumcision    . Hand surgery    . Abdominal abcess     Prior to Admission medications   Medication Sig Start Date End Date Taking? Authorizing Provider  amLODipine (NORVASC) 5 MG tablet Take 5 mg by mouth daily.      Historical Provider, MD  amoxicillin-clavulanate (AUGMENTIN) 875-125 MG  per tablet Take 1 tablet by mouth 2 (two) times daily. 07/08/12   Conni Slipper, PA-C  aspirin 81 MG tablet Take 81 mg by mouth daily.    Historical Provider, MD  atenolol (TENORMIN) 100 MG tablet Take 100 mg by mouth daily.      Historical Provider, MD  azithromycin (ZITHROMAX) 250 MG tablet As directed 08/12/12   Tammy S Parrett,  NP  bisacodyl (BISACODYL) 5 MG EC tablet 3 tabs every other day    Historical Provider, MD  dexamethasone (DECADRON) 4 MG tablet TAKE 10 TABLETS (40 MG) ONCE WEEKLY 06/16/12   Si Gaul, MD  diphenhydrAMINE (SOMINEX) 25 MG tablet Take 25 mg by mouth at bedtime.    Historical Provider, MD  docusate sodium (COLACE) 100 MG capsule Take 2 capsules by mouth twice daily    Historical Provider, MD  filgrastim (NEUPOGEN) 480 MCG/0.8ML SOLN injection Inject 0.8 mLs (480 mcg total) into the skin as directed. 04/21/12   Conni Slipper, PA-C  HUMALOG 100 UNIT/ML injection Sliding scale 07/07/12   Historical Provider, MD  insulin glargine (LANTUS) 100 UNIT/ML injection Inject 64 Units into the skin at bedtime.     Historical Provider, MD  mineral oil external liquid by Does not apply route every other day.    Historical Provider, MD  oxycodone (OXY-IR) 5 MG capsule Take 1 capsule (5 mg total) by mouth every 4 (four) hours as needed. 06/16/12   Si Gaul, MD  oxyCODONE (OXYCONTIN) 40 MG 12 hr tablet Take 1 tablet (40 mg total) by mouth every 12 (twelve) hours. 07/14/12   Si Gaul, MD  pomalidomide (POMALYST) 4 MG capsule Take 1 capsule (4 mg total) by mouth daily. 07/19/12   Si Gaul, MD  senna (SENOKOT) 8.6 MG tablet Take 3 tablets by mouth every other day.     Historical Provider, MD  simvastatin (ZOCOR) 20 MG tablet Take 20 mg by mouth every evening.    Historical Provider, MD  Tamsulosin HCl (FLOMAX) 0.4 MG CAPS Take 1 capsule (0.4 mg total) by mouth daily after supper. 10/27/11   Christina P Rama, MD  TRAVATAN Z 0.004 % SOLN ophthalmic solution Place 1 drop into both eyes at bedtime. 06/14/12   Historical Provider, MD  valsartan-hydrochlorothiazide (DIOVAN-HCT) 320-25 MG per tablet Take 1 tablet by mouth daily.    Historical Provider, MD   Allergies  Allergen Reactions  . Ace Inhibitors     He is not sure about allergy to ace inhibitors    FAMILY HISTORY:  Family History  Problem  Relation Age of Onset  . Cancer Sister   . Diabetes Other   . Hypertension Other    SOCIAL HISTORY:  reports that he quit smoking about 4 years ago. His smoking use included Cigarettes. He has a 22 pack-year smoking history. He has never used smokeless tobacco. He reports that he does not drink alcohol or use illicit drugs.  REVIEW OF SYSTEMS:   Positive in BOLD Constitutional:   No  weight loss, night sweats,  Fevers, chills, fatigue, lassitude. HEENT:   No headaches,  Difficulty swallowing,  Tooth/dental problems,  Sore throat,                No sneezing, itching, ear ache, nasal congestion, post nasal drip,   CV:  No chest pain,  Orthopnea, PND, swelling in lower extremities, anasarca, dizziness, palpitations  GI  No heartburn, indigestion, abdominal pain, nausea, vomiting, diarrhea, change in bowel habits, loss of appetite  Resp:  shortness of breath with exertion or at rest.   excess mucus,  productive cough,  No non-productive cough,  No coughing up of blood.   change in color of mucus.  No wheezing.  No chest wall deformity  Skin: no rash or lesions.  GU: no dysuria, change in color of urine, no urgency or frequency.  No flank pain.  MS: joint pain or swelling.  No decreased range of motion.  Back pain Bone pain  Psych:  No change in mood or affect. No depression or anxiety.  No memory loss.   SUBJECTIVE:  The patient is seen in the emergency room and is in no acute distress VITAL SIGNS: Temp:  [100.2 F (37.9 C)-102.7 F (39.3 C)] 102.7 F (39.3 C) (06/22 0747) Pulse Rate:  [72-86] 72 (06/22 1030) Resp:  [17-22] 17 (06/22 1030) BP: (85-106)/(55-73) 96/66 mmHg (06/22 1030) SpO2:  [92 %-98 %] 98 % (06/22 0748) HEMODYNAMICS: Blood pressure is soft but is responding to volume   Nasal cannula oxygen saturation adequate at 98%   INTAKE / OUTPUT: Intake/Output   None     PHYSICAL EXAMINATION: General:  Ill-appearing African American male in no acute  distress Neuro:  Awake and alert moves all 4 HEENT:  No jugular venous distention oropharynx shows dry mucous membranes Cardiovascular:  Regular rate and rhythm normal S1-S2 no S3 or S4 Lungs:  Bilateral rales and distant breath sounds Abdomen:  Soft nontender no organomegaly Musculoskeletal:  Diffuse bone pain no joint deformity Skin:  Clear  LABS:  Recent Labs Lab 08/14/12 0800 08/14/12 0858  HGB 8.1*  --   WBC 1.4*  --   PLT 243  --   NA 132*  --   K 5.2*  --   CL 97  --   CO2 19  --   GLUCOSE 281*  --   BUN 78*  --   CREATININE 3.35*  --   CALCIUM 9.1  --   LATICACIDVEN  --  1.16    Recent Labs Lab 08/14/12 0840  GLUCAP 283*    CXR: Bilateral interstitial infiltrates mainly at the bases of the lungs  ASSESSMENT / PLAN: Principal Problem:   Septic shock Active Problems:   Multiple myeloma in relapse   Acquired immunocompromised state   DM (diabetes mellitus), type 2 with renal complications   CKD (chronic kidney disease) stage 3, GFR 30-59 ml/min   Acute respiratory failure with hypoxia   Neutropenia   Severe sepsis   Opportunistic pneumonia   PULMONARY A: Acute hypoxic respiratory failure the basis of bilateral atypical opportunistic pneumonia do to neutropenia organism not yet specified P:   Nasal cannula oxygen Albuterol nebs  CARDIOVASCULAR A: Mild septic shock responding to volume Note patient was continuing antihypertensive meds as the patient developed onset of illness P:  Check echocardiogram Phase 1 septic shock protocol No indication for central line as of yet Hold all antihypertensive meds  RENAL A:  Stage III chronic renal disease P:   Monitor  GASTROINTESTINAL A:  No acute GI issues P:   Monitor  HEMATOLOGIC A: Multiple myeloma IgA subtype refractory to treatment and not a candidate for bone marrow transplantation Neutropenia Severe immunosuppression P:  We'll contact oncology of patient's admission Likely continue  Neupogen  INFECTIOUS A:  Immunosuppressed state, neutropenia, opportunistic pneumonia, septic shock P:   Broad-spectrum antibiotics Respiratory viral screen CMV screening  ENDOCRINE A:  Diabetes type 2   P:   ICU hyperglycemia protocol hold  Lantus for now  NEUROLOGIC A:  No acute neurologic issues P:   Monitor  TODAY'S SUMMARY:  67 year old African American male with refractory multiple myeloma IgA subtype now presenting with neutropenia and opportunistic pneumonia with septic shock. Plan today will be to support in the ICU with volume, broad-spectrum antibiotics. Will obtain echocardiogram. Will refer back to oncology and notification of admission.  I have personally obtained a history, examined the patient, evaluated laboratory and imaging results, formulated the assessment and plan and placed orders. CRITICAL CARE: The patient is critically ill with multiple organ systems failure and requires high complexity decision making for assessment and support, frequent evaluation and titration of therapies, application of advanced monitoring technologies and extensive interpretation of multiple databases. Critical Care Time devoted to patient care services described in this note is 60 minutes.   Dorcas Carrow Beeper  [phone]  Cell  [phone]  If no response or cell goes to voicemail, call beeper [phone]  Pulmonary and Critical Care Medicine [facility] Pager: [phone]  08/14/2012, 10:46 AM

## 2012-08-14 NOTE — ED Notes (Signed)
ZOX:[id number]<AV> Expected date:08/14/12<BR> Expected time: 7:06 AM<BR> Means of arrival:Ambulance<BR> Comments:<BR>

## 2012-08-14 NOTE — Plan of Care (Signed)
Problem: Phase I Progression Outcomes Goal: Pain controlled with appropriate interventions Outcome: Progressing Denies pain.     

## 2012-08-14 NOTE — ED Notes (Signed)
Patient dx multiple myeloma currently receiving treatment. EMS temp 100.9 tympanic.Alert and oriented X2. Slow to answer questions for EMS. cbg 254 and has DM. 4LNC from 88% to 97%.

## 2012-08-14 NOTE — ED Provider Notes (Signed)
History     CSN: [id number]  Arrival date & time 08/14/12  7829   First MD Initiated Contact with Patient 08/14/12 0747      Chief Complaint  Patient presents with  . Fever    (Consider location/radiation/quality/duration/timing/severity/associated sxs/prior treatment) HPI  .Rodney Green 67 y/o M with pmh of Hep C multiple myeloma and emergency department chief fever.  Level 5 caveat due to AMS. hisotyr is given by his wife.  Wife states that since Thursday he has had a cough.  It is occasionally productive of normal colored sputum.  She denies any chest pain with breathing.  Wife states that he began having mild confusion last night.  This morning she attempted to take his temperature but was unable.  She states that he was warm to the types and had acute change in mental status with a mild confusion.  She brought him here to the emergency department.  She has noticed no hematuria, foul odor of the urine.  The patient denies any flank pain, dysuria.  He has had no other abdominal symptoms..Denies unilateral weakness, facial asymmetry, difficulty with speech, change in gait, or vertigo.  Patient is seen by Dr. Gwenyth Bouillon in oncology.   Past Medical History  Diagnosis Date  . Allergy   . Diabetes mellitus   . Hypertension   . Hepatitis C   . Multiple myeloma   . Multiple myeloma   . Chronic kidney disease     Past Surgical History  Procedure Laterality Date  . Circumcision    . Hand surgery    . Abdominal abcess      Family History  Problem Relation Age of Onset  . Cancer Sister   . Diabetes Other   . Hypertension Other     History  Substance Use Topics  . Smoking status: Former Smoker -- 0.50 packs/day for 44 years    Types: Cigarettes    Quit date: 01/24/2008  . Smokeless tobacco: Never Used  . Alcohol Use: No     Comment: occ      Review of Systems  Unable to perform ROS   Allergies  Ace inhibitors  Home Medications   Current Outpatient Rx  Name   Route  Sig  Dispense  Refill  . amLODipine (NORVASC) 5 MG tablet   Oral   Take 5 mg by mouth daily.           Marland Kitchen amoxicillin-clavulanate (AUGMENTIN) 875-125 MG per tablet   Oral   Take 1 tablet by mouth 2 (two) times daily.   14 tablet   0   . aspirin 81 MG tablet   Oral   Take 81 mg by mouth daily.         Marland Kitchen atenolol (TENORMIN) 100 MG tablet   Oral   Take 100 mg by mouth daily.           Marland Kitchen azithromycin (ZITHROMAX) 250 MG tablet      As directed   6 each   0   . bisacodyl (BISACODYL) 5 MG EC tablet      3 tabs every other day         . dexamethasone (DECADRON) 4 MG tablet      TAKE 10 TABLETS (40 MG) ONCE WEEKLY   130 tablet   0   . diphenhydrAMINE (SOMINEX) 25 MG tablet   Oral   Take 25 mg by mouth at bedtime.         . docusate  sodium (COLACE) 100 MG capsule      Take 2 capsules by mouth twice daily         . filgrastim (NEUPOGEN) 480 MCG/0.8ML SOLN injection   Subcutaneous   Inject 0.8 mLs (480 mcg total) into the skin as directed.   4 Syringe   1   . HUMALOG 100 UNIT/ML injection      Sliding scale         . insulin glargine (LANTUS) 100 UNIT/ML injection   Subcutaneous   Inject 64 Units into the skin at bedtime.          . mineral oil external liquid   Does not apply   by Does not apply route every other day.         Marland Kitchen oxycodone (OXY-IR) 5 MG capsule   Oral   Take 1 capsule (5 mg total) by mouth every 4 (four) hours as needed.   30 capsule   0   . oxyCODONE (OXYCONTIN) 40 MG 12 hr tablet   Oral   Take 1 tablet (40 mg total) by mouth every 12 (twelve) hours.   60 tablet   0   . pomalidomide (POMALYST) 4 MG capsule   Oral   Take 1 capsule (4 mg total) by mouth daily.   21 capsule   0     ADULT MALE/AUTHORIZATION #[id number] 07/19/12   . senna (SENOKOT) 8.6 MG tablet   Oral   Take 3 tablets by mouth every other day.          . simvastatin (ZOCOR) 20 MG tablet   Oral   Take 20 mg by mouth every evening.          . Tamsulosin HCl (FLOMAX) 0.4 MG CAPS   Oral   Take 1 capsule (0.4 mg total) by mouth daily after supper.   30 capsule   2   . TRAVATAN Z 0.004 % SOLN ophthalmic solution   Both Eyes   Place 1 drop into both eyes at bedtime.         . valsartan-hydrochlorothiazide (DIOVAN-HCT) 320-25 MG per tablet   Oral   Take 1 tablet by mouth daily.           BP 89/59  Pulse 85  Temp(Src) 102.7 F (39.3 C) (Rectal)  Resp 20  SpO2 98%  Physical Exam Physical Exam  Nursing note and vitals reviewed. Constitutional: The patient appears chronically ill.  HENT:  Head: Normocephalic and atraumatic.  Eyes: Conjunctivae normal are normal. No scleral icterus.  Neck: Normal range of motion. Neck supple.  Cardiovascular: Normal rate, regular rhythm and normal heart sounds.   Pulmonary/Chest: Effort normal and breath sounds normal. No respiratory distress.  patient's satting at 100% on 4 L of oxygen. Abdominal: Soft. There is no tenderness.  Musculoskeletal: Bilateral nonpitting edema of the ankles.    Neurological: Patient is alert to person only.  When asked what day it was he responded Saturday.  When asked what year it was he also responded Saturday to Skin: Skin is warm and dry. He is not diaphoretic.  Psychiatric: His behavior is normal.    ED Course  Procedures (including critical care time)  Labs Reviewed  CBC WITH DIFFERENTIAL - Abnormal; Notable for the following:    WBC 1.4 (*)    RBC 2.89 (*)    Hemoglobin 8.1 (*)    HCT 25.3 (*)    RDW 15.9 (*)    Monocytes Relative 39 (*)  Neutro Abs 0.7 (*)    Lymphs Abs 0.2 (*)    All other components within normal limits  BASIC METABOLIC PANEL - Abnormal; Notable for the following:    Sodium 132 (*)    Potassium 5.2 (*)    Glucose, Bld 281 (*)    BUN 78 (*)    Creatinine, Ser 3.35 (*)    GFR calc non Af Amer 18 (*)    GFR calc Af Amer 21 (*)    All other components within normal limits  GLUCOSE, CAPILLARY - Abnormal;  Notable for the following:    Glucose-Capillary 283 (*)    All other components within normal limits  URINE CULTURE  CULTURE, BLOOD (ROUTINE X 2)  CULTURE, BLOOD (ROUTINE X 2)  CULTURE, EXPECTORATED SPUTUM-ASSESSMENT  URINALYSIS, ROUTINE W REFLEX MICROSCOPIC  PROCALCITONIN  PRO B NATRIURETIC PEPTIDE  PROTIME-INR  CG4 I-STAT (LACTIC ACID)   No results found.  CRITICAL CARE Performed by: Arthor Captain   Total critical care time: 50  Critical care time was exclusive of separately billable procedures and treating other patients.  Critical care was necessary to treat or prevent imminent or life-threatening deterioration.  Critical care was time spent personally by me on the following activities: development of treatment plan with patient and/or surrogate as well as nursing, discussions with consultants, evaluation of patient's response to treatment, examination of patient, obtaining history from patient or surrogate, ordering and performing treatments and interventions, ordering and review of laboratory studies, ordering and review of radiographic studies, pulse oximetry and re-evaluation of patient's condition.   No diagnosis found.    MDM  Patient with hypotension and, fever, immune compromise.  He's been admitted previously for clear required ammonia.  Appears septic.  Patient is currently getting 1 L of fluid.  He has been given rectal Tylenol.  Seen and shared visit with Dr. Radford Pax  Patient with critically low  WBC (1.4) review of his chart shows that he fluctutes between about 1.4 and 2.5. Patient aslo  AKI with 1 point increase in his hgb  CXR shows new pulmonary edema. Patient fever appears to be decreasing and he is A&O x 4 at this point.  He has no complaints.   9:31 AM I have spoken to the patient and his wife about DNR/DNI status. Patient states that he would want basic interventions but states the he does not wish to be intubated or be placed on life support. The  patient appears to be of sound mind and capable of making this decision currently.    Patient admitted to pulmonary critical care management.   Arthor Captain, PA-C 08/14/12 1249

## 2012-08-14 NOTE — Progress Notes (Addendum)
  ANTIBIOTIC CONSULT NOTE - INITIAL  Pharmacy Consult for Vancomycin and Zosyn Indication: Atypical pneumonia/sepsis  Allergies  Allergen Reactions  . Ace Inhibitors     He is not sure about allergy to ace inhibitors    Patient Measurements:     Vital Signs: Temp: 102.7 F (39.3 C) (06/22 0747) Temp src: Rectal (06/22 0747) BP: 96/66 mmHg (06/22 1030) Pulse Rate: 72 (06/22 1030) Intake/Output from previous day:   Intake/Output from this shift:    Labs:  Recent Labs  08/14/12 0800  WBC 1.4*  HGB 8.1*  PLT 243  CREATININE 3.35*   The CrCl is unknown because both a height and weight (above a minimum accepted value) are required for this calculation. No results found for this basename: VANCOTROUGH, VANCOPEAK, VANCORANDOM, GENTTROUGH, GENTPEAK, GENTRANDOM, TOBRATROUGH, TOBRAPEAK, TOBRARND, AMIKACINPEAK, AMIKACINTROU, AMIKACIN,  in the last 72 hours   Microbiology: No results found for this or any previous visit (from the past 720 hour(s)).  Medical History: Past Medical History  Diagnosis Date  . Allergy   . Diabetes mellitus   . Hypertension   . Hepatitis C   . Multiple myeloma   . Multiple myeloma   . Chronic kidney disease     Medications:   (Not in a hospital admission) Anti-infectives   Start     Dose/Rate Route Frequency Ordered Stop   08/14/12 1045  azithromycin (ZITHROMAX) 500 mg in dextrose 5 % 250 mL IVPB     500 mg 250 mL/hr over 60 Minutes Intravenous Every 24 hours 08/14/12 1043       Assessment: 67yo M with multiple myeloma admitted with neutropenia and bilateral atypical pneumonia and sepsis. Started on Azithromycin and pharmacy is asked to dose Zosyn and Vanc. Recent weight 85 kg.  Tmax: 102.7 WBCs: 1.4 Renal: 3.4, 26.4CG, 23N. (CKD III)  6/22 blood: 6/22 urine:  6/22 sputum:   Goal of Therapy:  Vancomycin trough level 15-20 mcg/ml  Plan:   Vancomycin 1g IV q24h.  Zosyn 3.375g IV Q8H infused over 4hrs.  Measure Vanc trough  at steady state.  Follow up renal fxn and culture results.  Charolotte Eke, PharmD, pager [phone]. 08/14/2012,10:57 AM.

## 2012-08-14 NOTE — ED Notes (Signed)
Started having cold symptoms this past Friday after wife had a cold. Patient coughing of yellow phlegm. Started on zpak on Friday night. Last azithromycin was last night. Wife states that patient seemed confused and lethargic last night and slow to respond to questions and slow motor activity. Patient more confused this morning. Wife attempted top get patient to urinate. At that point patient stood up and urinated in the floor and wife called 911. Patient also complained of HA and jaw pain last night. Jaw pain is new. Denies having CP

## 2012-08-14 NOTE — ED Provider Notes (Signed)
Medical screening examination/treatment/procedure(s) were conducted as a shared visit with non-physician practitioner(s) and myself.  I personally evaluated the patient during the encounter    .Face to face Exam:  General:  Alert HEENT:  Atraumatic Resp:  Normal effort Abd:  Nondistended Neuro:No focal deficits     Nelia Shi, MD 08/14/12 1404

## 2012-08-15 ENCOUNTER — Other Ambulatory Visit: Payer: Self-pay | Admitting: Lab

## 2012-08-15 ENCOUNTER — Encounter (HOSPITAL_COMMUNITY): Payer: Self-pay | Admitting: Interventional Cardiology

## 2012-08-15 ENCOUNTER — Inpatient Hospital Stay (HOSPITAL_COMMUNITY): Payer: 59

## 2012-08-15 ENCOUNTER — Ambulatory Visit: Payer: Self-pay | Admitting: Internal Medicine

## 2012-08-15 DIAGNOSIS — R0602 Shortness of breath: Secondary | ICD-10-CM | POA: Insufficient documentation

## 2012-08-15 LAB — GLUCOSE, CAPILLARY
Glucose-Capillary: 127 mg/dL — ABNORMAL HIGH (ref 70–99)
Glucose-Capillary: 68 mg/dL — ABNORMAL LOW (ref 70–99)
Glucose-Capillary: 74 mg/dL (ref 70–99)
Glucose-Capillary: 96 mg/dL (ref 70–99)
Glucose-Capillary: 174 mg/dL — ABNORMAL HIGH (ref 70–99)

## 2012-08-15 LAB — BLOOD GAS, ARTERIAL
Acid-base deficit: 4.7 mmol/L — ABNORMAL HIGH (ref 0.0–2.0)
Acid-base deficit: 3.5 mmol/L — ABNORMAL HIGH (ref 0.0–2.0)
Delivery systems: POSITIVE
Drawn by: 307971
Drawn by: 317871
FIO2: 0.4 %
O2 Saturation: 95.8 %
O2 Saturation: 96.9 %
PEEP: 5 cmH2O
PEEP: 5 cmH2O
Patient temperature: 98.6
Patient temperature: 98.6
RATE: 15 resp/min
RATE: 18 resp/min
TCO2: 17.1 mmol/L (ref 0–100)
TCO2: 19 mmol/L (ref 0–100)
pH, Arterial: 7.387 (ref 7.350–7.450)

## 2012-08-15 LAB — CBC WITH DIFFERENTIAL/PLATELET
Basophils Relative: 1 % (ref 0–1)
Eosinophils Absolute: 0.1 10*3/uL (ref 0.0–0.7)
HCT: 23.6 % — ABNORMAL LOW (ref 39.0–52.0)
Hemoglobin: 7.4 g/dL — ABNORMAL LOW (ref 13.0–17.0)
Lymphocytes Relative: 8 % — ABNORMAL LOW (ref 12–46)
MCHC: 31.4 g/dL (ref 30.0–36.0)
Neutro Abs: 1.3 10*3/uL — ABNORMAL LOW (ref 1.7–7.7)

## 2012-08-15 LAB — MAGNESIUM: Magnesium: 1.8 mg/dL (ref 1.5–2.5)

## 2012-08-15 LAB — LIPASE, BLOOD: Lipase: 6 U/L — ABNORMAL LOW (ref 11–59)

## 2012-08-15 LAB — CBC
HCT: 24 % — ABNORMAL LOW (ref 39.0–52.0)
MCH: 27.8 pg (ref 26.0–34.0)
MCV: 87.9 fL (ref 78.0–100.0)
Platelets: 223 10*3/uL (ref 150–400)
RBC: 2.73 MIL/uL — ABNORMAL LOW (ref 4.22–5.81)
RDW: 16.3 % — ABNORMAL HIGH (ref 11.5–15.5)
WBC: 1.5 10*3/uL — ABNORMAL LOW (ref 4.0–10.5)

## 2012-08-15 LAB — BASIC METABOLIC PANEL
BUN: 56 mg/dL — ABNORMAL HIGH (ref 6–23)
BUN: 53 mg/dL — ABNORMAL HIGH (ref 6–23)
CO2: 19 mEq/L (ref 19–32)
CO2: 19 mEq/L (ref 19–32)
CO2: 23 mEq/L (ref 19–32)
Calcium: 8.6 mg/dL (ref 8.4–10.5)
Calcium: 8.6 mg/dL (ref 8.4–10.5)
Chloride: 104 mEq/L (ref 96–112)
Chloride: 107 mEq/L (ref 96–112)
Chloride: 100 mEq/L (ref 96–112)
Creatinine, Ser: 2.44 mg/dL — ABNORMAL HIGH (ref 0.50–1.35)
Creatinine, Ser: 2.41 mg/dL — ABNORMAL HIGH (ref 0.50–1.35)
Glucose, Bld: 140 mg/dL — ABNORMAL HIGH (ref 70–99)
Glucose, Bld: 188 mg/dL — ABNORMAL HIGH (ref 70–99)
Potassium: 4.6 mEq/L (ref 3.5–5.1)
Sodium: 134 mEq/L — ABNORMAL LOW (ref 135–145)

## 2012-08-15 LAB — RESPIRATORY VIRUS PANEL
Adenovirus: NOT DETECTED
Influenza A H1: NOT DETECTED
Influenza A H3: NOT DETECTED
Influenza A: NOT DETECTED
Parainfluenza 2: NOT DETECTED
Respiratory Syncytial Virus B: NOT DETECTED

## 2012-08-15 LAB — HEPATIC FUNCTION PANEL
AST: 12 U/L (ref 0–37)
Albumin: 2.2 g/dL — ABNORMAL LOW (ref 3.5–5.2)
Total Protein: 7 g/dL (ref 6.0–8.3)

## 2012-08-15 LAB — PROTIME-INR: Prothrombin Time: 15.1 seconds (ref 11.6–15.2)

## 2012-08-15 LAB — BODY FLUID CELL COUNT WITH DIFFERENTIAL: Total Nucleated Cell Count, Fluid: 3155 cu mm — ABNORMAL HIGH (ref 0–1000)

## 2012-08-15 LAB — PRO B NATRIURETIC PEPTIDE: Pro B Natriuretic peptide (BNP): 4436 pg/mL — ABNORMAL HIGH (ref 0–125)

## 2012-08-15 LAB — PROCALCITONIN: Procalcitonin: 3.1 ng/mL

## 2012-08-15 LAB — LACTIC ACID, PLASMA: Lactic Acid, Venous: 0.9 mmol/L (ref 0.5–2.2)

## 2012-08-15 LAB — CK: Total CK: 30 U/L (ref 7–232)

## 2012-08-15 MED ORDER — ALBUTEROL SULFATE (5 MG/ML) 0.5% IN NEBU
2.5000 mg | INHALATION_SOLUTION | RESPIRATORY_TRACT | Status: DC | PRN
  Administered 2012-08-15 – 2012-08-18 (×2): 2.5 mg via RESPIRATORY_TRACT
  Filled 2012-08-15 (×2): qty 0.5

## 2012-08-15 MED ORDER — OXYCODONE HCL 5 MG PO TABS
5.0000 mg | ORAL_TABLET | ORAL | Status: DC | PRN
  Administered 2012-08-15: 5 mg via ORAL
  Filled 2012-08-15: qty 1

## 2012-08-15 MED ORDER — INSULIN ASPART 100 UNIT/ML ~~LOC~~ SOLN
1.0000 [IU] | SUBCUTANEOUS | Status: DC
  Administered 2012-08-15 – 2012-08-16 (×3): 2 [IU] via SUBCUTANEOUS
  Administered 2012-08-16 (×2): 3 [IU] via SUBCUTANEOUS

## 2012-08-15 MED ORDER — FENTANYL CITRATE 0.05 MG/ML IJ SOLN
100.0000 ug | Freq: Once | INTRAMUSCULAR | Status: AC
  Administered 2012-08-15: 100 ug via INTRAVENOUS
  Filled 2012-08-15: qty 2

## 2012-08-15 MED ORDER — FENTANYL CITRATE 0.05 MG/ML IJ SOLN
INTRAMUSCULAR | Status: AC
  Filled 2012-08-15: qty 2

## 2012-08-15 MED ORDER — ROCURONIUM BROMIDE 50 MG/5ML IV SOLN
INTRAVENOUS | Status: AC
  Administered 2012-08-15: 80 mg
  Filled 2012-08-15: qty 2

## 2012-08-15 MED ORDER — MORPHINE SULFATE 2 MG/ML IJ SOLN
2.0000 mg | Freq: Once | INTRAMUSCULAR | Status: AC
  Administered 2012-08-15: 2 mg via INTRAVENOUS

## 2012-08-15 MED ORDER — FUROSEMIDE 10 MG/ML IJ SOLN
60.0000 mg | Freq: Once | INTRAMUSCULAR | Status: AC
  Administered 2012-08-15: 60 mg via INTRAVENOUS
  Filled 2012-08-15: qty 6

## 2012-08-15 MED ORDER — LORAZEPAM 2 MG/ML IJ SOLN: 0.5000 mg | Freq: Once | INTRAMUSCULAR | Status: DC

## 2012-08-15 MED ORDER — MIDAZOLAM HCL 2 MG/2ML IJ SOLN
INTRAMUSCULAR | Status: AC
  Filled 2012-08-15: qty 2

## 2012-08-15 MED ORDER — FUROSEMIDE 10 MG/ML IJ SOLN
40.0000 mg | Freq: Once | INTRAMUSCULAR | Status: AC
  Administered 2012-08-15: 40 mg via INTRAVENOUS

## 2012-08-15 MED ORDER — MIDAZOLAM HCL 2 MG/2ML IJ SOLN
2.0000 mg | Freq: Once | INTRAMUSCULAR | Status: AC
  Administered 2012-08-15: 2 mg via INTRAVENOUS

## 2012-08-15 MED ORDER — FENTANYL CITRATE 0.05 MG/ML IJ SOLN
100.0000 ug | Freq: Once | INTRAMUSCULAR | Status: AC
  Administered 2012-08-15: 100 ug via INTRAVENOUS

## 2012-08-15 MED ORDER — ETOMIDATE 2 MG/ML IV SOLN
INTRAVENOUS | Status: AC
  Administered 2012-08-15: 20 mg via INTRAVENOUS
  Filled 2012-08-15: qty 20

## 2012-08-15 MED ORDER — MORPHINE SULFATE 2 MG/ML IJ SOLN
INTRAMUSCULAR | Status: AC
  Administered 2012-08-15: 2 mg via INTRAVENOUS
  Filled 2012-08-15: qty 1

## 2012-08-15 MED ORDER — PHENYLEPHRINE HCL 10 MG/ML IJ SOLN
30.0000 ug/min | INTRAVENOUS | Status: DC
  Filled 2012-08-15: qty 1

## 2012-08-15 MED ORDER — LORAZEPAM 2 MG/ML IJ SOLN
1.0000 mg | INTRAMUSCULAR | Status: DC | PRN
  Administered 2012-08-15: 1 mg via INTRAVENOUS
  Filled 2012-08-15 (×2): qty 1

## 2012-08-15 MED ORDER — ALBUTEROL SULFATE (5 MG/ML) 0.5% IN NEBU
2.5000 mg | INHALATION_SOLUTION | RESPIRATORY_TRACT | Status: DC | PRN
  Administered 2012-08-15: 2.5 mg via RESPIRATORY_TRACT

## 2012-08-15 MED ORDER — METHYLPREDNISOLONE SODIUM SUCC 125 MG IJ SOLR
60.0000 mg | Freq: Three times a day (TID) | INTRAMUSCULAR | Status: DC
  Administered 2012-08-15 – 2012-08-16 (×3): 60 mg via INTRAVENOUS
  Filled 2012-08-15 (×5): qty 0.96
  Filled 2012-08-15: qty 2
  Filled 2012-08-15: qty 0.96

## 2012-08-15 MED ORDER — MORPHINE SULFATE 4 MG/ML IJ SOLN
INTRAMUSCULAR | Status: AC
  Filled 2012-08-15: qty 1

## 2012-08-15 MED ORDER — FENTANYL CITRATE 0.05 MG/ML IJ SOLN
25.0000 ug/h | INTRAMUSCULAR | Status: DC
  Administered 2012-08-15: 50 ug/h via INTRAVENOUS
  Filled 2012-08-15 (×2): qty 50

## 2012-08-15 MED ORDER — FILGRASTIM 480 MCG/1.6ML IJ SOLN
480.0000 ug | Freq: Every day | INTRAMUSCULAR | Status: AC
  Administered 2012-08-15 – 2012-08-18 (×4): 480 ug via SUBCUTANEOUS
  Filled 2012-08-15 (×4): qty 1.6

## 2012-08-15 MED ORDER — INSULIN ASPART 100 UNIT/ML ~~LOC~~ SOLN: 0.0000 [IU] | SUBCUTANEOUS | Status: DC

## 2012-08-15 MED ORDER — SODIUM CHLORIDE 0.9 % IV SOLN: 1000.0000 mL | INTRAVENOUS | Status: DC

## 2012-08-15 MED ORDER — LIDOCAINE HCL (CARDIAC) 20 MG/ML IV SOLN
INTRAVENOUS | Status: AC
  Filled 2012-08-15: qty 5

## 2012-08-15 MED ORDER — IPRATROPIUM BROMIDE 0.02 % IN SOLN: 0.5000 mg | RESPIRATORY_TRACT | Status: DC | PRN

## 2012-08-15 MED ORDER — PROPOFOL 10 MG/ML IV EMUL
5.0000 ug/kg/min | INTRAVENOUS | Status: DC
  Administered 2012-08-15: 50 ug/kg/min via INTRAVENOUS
  Administered 2012-08-16: 25 ug/kg/min via INTRAVENOUS
  Administered 2012-08-16: 25.01 ug/kg/min via INTRAVENOUS
  Administered 2012-08-16: 39.078 ug/kg/min via INTRAVENOUS
  Filled 2012-08-15 (×6): qty 100

## 2012-08-15 MED ORDER — SUCCINYLCHOLINE CHLORIDE 20 MG/ML IJ SOLN
INTRAMUSCULAR | Status: AC
  Filled 2012-08-15: qty 1

## 2012-08-15 MED ORDER — FUROSEMIDE 10 MG/ML IJ SOLN
INTRAMUSCULAR | Status: AC
  Filled 2012-08-15: qty 4

## 2012-08-15 MED ORDER — LORAZEPAM 2 MG/ML IJ SOLN: 1.0000 mg | Freq: Once | INTRAMUSCULAR | Status: DC

## 2012-08-15 MED ORDER — FENTANYL BOLUS VIA INFUSION
25.0000 ug | Freq: Four times a day (QID) | INTRAVENOUS | Status: DC | PRN
  Filled 2012-08-15: qty 100

## 2012-08-15 NOTE — Plan of Care (Signed)
Problem: Consults Goal: Diabetes Guidelines if Diabetic/Glucose > 140 If diabetic or lab glucose is > 140 mg/dl - Initiate Diabetes/Hyperglycemia Guidelines & Document Interventions  Outcome: Progressing Glucose stabilizer DC'ed moderate sliding scale now in use

## 2012-08-15 NOTE — Procedures (Signed)
Central Venous Catheter Insertion Procedure Note Rodney Green [id number] [birth year]  Procedure: Insertion of Central Venous Catheter Indications: Assessment of intravascular volume, Drug and/or fluid administration and Frequent blood sampling  Procedure Details Consent: Risks of procedure as well as the alternatives and risks of each were explained to the (patient/caregiver).  Consent for procedure obtained. Time Out: Verified patient identification, verified procedure, site/side was marked, verified correct patient position, special equipment/implants available, medications/allergies/relevent history reviewed, required imaging and test results available.  Performed  Maximum sterile technique was used including antiseptics, cap, gloves, gown, hand hygiene, mask and sheet. Skin prep: Chlorhexidine; local anesthetic administered A antimicrobial bonded/coated triple lumen catheter was placed in the left internal jugular vein using the Seldinger technique.  Evaluation Blood flow good Complications: No apparent complications Patient did tolerate procedure well. Chest X-ray ordered to verify placement.  CXR: pending  Real time 2D ultrasound used for vein site selection, patency assessment, and needle entry. / A record of image was made but could not be submitted for filing due to malfunction of printing device .  Rodney Green 08/15/2012, 6:02 PM

## 2012-08-15 NOTE — Progress Notes (Signed)
eLink Physician-Brief Progress Note Patient Name: Rodney Green DOB: 08-Apr-1945 MRN: [id number]  Date of Service  08/15/2012   HPI/Events of Note  Increased resp distress.  Is net positive.  Sats on [place] are in the low 90s   eICU Interventions  Plan Prn nebs KVO IVFs PCXR   Intervention Category Intermediate Interventions: Respiratory distress - evaluation and management  DETERDING,ELIZABETH 08/15/2012, 4:46 AM

## 2012-08-15 NOTE — Procedures (Signed)
Intubation Procedure Note Rodney Green Rodney Green [id number] 29-Dec-1945  Procedure: Intubation Indications: Respiratory insufficiency  Procedure Details Consent: Risks of procedure as well as the alternatives and risks of each were explained to the (patient/caregiver).  Consent for procedure obtained. Time Out: Verified patient identification, verified procedure, site/side was marked, verified correct patient position, special equipment/implants available, medications/allergies/relevent history reviewed, required imaging and test results available.  Performed  Maximum sterile technique was used including cap, gloves, gown and mask.  MAC  glidescope used  Evaluation Hemodynamic Status: BP stable throughout; O2 sats: transiently fell during during procedure Patient's Current Condition: stable Complications: No apparent complications Patient did tolerate procedure well. Chest X-ray ordered to verify placement.  CXR: pending.   Rodney Green 08/15/2012

## 2012-08-15 NOTE — Progress Notes (Signed)
Inpatient Diabetes Program Recommendations  AACE/ADA: New Consensus Statement on Inpatient Glycemic Control (2013)  Target Ranges:  Prepandial:   less than 140 mg/dL      Peak postprandial:   less than 180 mg/dL (1-2 hours)      Critically ill patients:  140 - 180 mg/dL   Reason for Visit: Hyperglycemia  GlucoStabilizer discontinued.  CBGs WNL.  Needs basal insulin.  Pt on Lantus 64 units QHS at home.  Recommend Lantus 25 -30 units QHS. Novolog 3 units tidwc for meal coverage insulin if pt eats >50% meal.  Will follow.  Thank you. Ailene Ards, RD, LDN, CDE Inpatient Diabetes Coordinator [phone]

## 2012-08-15 NOTE — Progress Notes (Signed)
Echocardiogram 2D Echocardiogram has been performed.  Adlai Nieblas 08/15/2012, 3:46 PM

## 2012-08-15 NOTE — Progress Notes (Signed)
Pt in xray CT of chest done, started to become restless and SOB, sats OK 95% resp. rate increased to 35 pt asking for more oxygen, placed on NRM and transported back to ICU.  RT notified and pt placed on bipap.  Continued to be restless pulling at bipap, ativan 1 mg given with  no response.  E-link called and Dr. Delford Field ordered additional sedation, lasix and notified Dr. Marchelle Gearing.

## 2012-08-15 NOTE — Procedures (Signed)
Bronchoscopy Procedure Note Rodney Green [id number] [birth year]  Procedure: Bronchoscopy Indications: Diagnostic evaluation of the airways and Obtain specimens for culture and/or other diagnostic studies  Procedure Details Consent: Risks of procedure as well as the alternatives and risks of each were explained to the (patient/caregiver).  Consent for procedure obtained. Time Out: Verified patient identification, verified procedure, site/side was marked, verified correct patient position, special equipment/implants available, medications/allergies/relevent history reviewed, required imaging and test results available.  Performed  In preparation for procedure, patient was given 100% FiO2 and bronchoscope lubricated. Sedation: Benzodiazepines, Muscle relaxants and Etomidate  Airway entered and the following bronchi were examined: RML.  20cc x 1 with copiiouys grey return Procedures performed: Brushings performed Bronchoscope removed.    Evaluation Hemodynamic Status: BP stable throughout; O2 sats: transiently fell during during procedure and currently acceptable Patient's Current Condition: stable Specimens:  Sent purulent fluid and - sent for Cell count, gram stain, culture, fungus, afb, virus opcr Complications: No apparent complications Patient did tolerate procedure well.   Rodney Green 08/15/2012

## 2012-08-15 NOTE — Progress Notes (Signed)
Hypoglycemic Event  CBG: 68  Treatment: 15 GM carbohydrate snack    Symptoms: None  Follow-up CBG: Time:4:52 CBG Result:74  Possible Reasons for Event: Unknown  Comments/MD notified:Dr. Deterding    [name], [name] Ann  Remember to initiate Hypoglycemia Order Set & complete

## 2012-08-15 NOTE — Progress Notes (Signed)
Called [facility] concerning glucostabilizer, requested order for BMET Q4 to verify anion gap, and obtain order for D5 1/2 NS. Orders given to stop glucostabilizer and use moderate sliding scale coverage q4 hr and keep IV Fluids at 75 ml/hr

## 2012-08-15 NOTE — Progress Notes (Signed)
Subjective: The patient is seen and examined today. He is currently on BiPAP and less sedated. He was admitted with septic shock most likely secondary to pneumonia. The patient has a history of multiple myeloma diagnosed several years ago status post several chemotherapy regimen and has been doing fine today now There is no significant evidence for recent disease progression. He is currently on treatment with Pomalyst and Decadron. He required frequent treatment with Neupogen because of the persistent Leukocytopenia and neutropenia.  Objective: Vital signs in last 24 hours: Temp:  [97.6 F (36.4 C)-102.9 F (39.4 C)] 98.6 F (37 C) (06/23 0400) Pulse Rate:  [48-97] 87 (06/23 0700) Resp:  [15-33] 28 (06/23 0700) BP: (86-170)/(53-105) 138/89 mmHg (06/23 0753) SpO2:  [85 %-100 %] 100 % (06/23 0753) FiO2 (%):  [30 %] 30 % (06/23 0753) Weight:  [188 lb 0.8 oz (85.3 kg)-189 lb 9.5 oz (86 kg)] 188 lb 0.8 oz (85.3 kg) (06/23 0500)  Intake/Output from previous day: 06/22 0701 - 06/23 0700 In: 2034.4 [P.O.:75; I.V.:1359.4; IV Piggyback:600] Out: 3425 [Urine:3425] Intake/Output this shift: Total I/O In: -  Out: 1250 [Urine:1250]  General appearance: uncooperative Resp: rales bilaterally Cardio: regular rate and rhythm, S1, S2 normal, no murmur, click, rub or gallop GI: soft, non-tender; bowel sounds normal; no masses,  no organomegaly Extremities: extremities normal, atraumatic, no cyanosis or edema  Lab Results:   Recent Labs  08/14/12 0800 08/15/12 0359  WBC 1.4* 1.5*  HGB 8.1* 7.6*  HCT 25.3* 24.0*  PLT 243 223   BMET  Recent Labs  08/15/12 0155 08/15/12 0359  NA 135 138  K 4.3 4.4  CL 104 107  CO2 19 19  GLUCOSE 140* 93  BUN 56* 53*  CREATININE 2.44* 2.41*  CALCIUM 8.5 8.6    Studies/Results: Dg Chest Port 1 View  08/15/2012   *RADIOLOGY REPORT*  Clinical Data: Pneumonia.  Increasing shortness of breath.  PORTABLE CHEST - 1 VIEW  Comparison: 08/14/2012  Findings:  Low lung volumes.  Mild vascular congestion and continued interstitial prominence could reflect interstitial edema.  No visible effusions.  No real change since prior study.  No acute bony abnormality.  IMPRESSION: Continued low lung volumes, vascular congestion and interstitial prominence.  No change.   Original Report Authenticated By: Charlett Nose, M.D.   Dg Chest Port 1 View  08/14/2012   *RADIOLOGY REPORT*  Clinical Data: Shortness of breath.  PORTABLE CHEST - 1 VIEW  Comparison: 05/19/2012  Findings: Lung volumes are low bilaterally.  Compared to the prior study, there is a definite increase in pulmonary vascularity suggestive of interstitial edema/CHF.  Heart size is normal.  No significant pleural fluid is identified.  IMPRESSION: Increase in pulmonary vascularity suggestive of interstitial edema/CHF.   Original Report Authenticated By: Irish Lack, M.D.    Medications: I have reviewed the patient's current medications.  CODE STATUS: No CODE BLUE  Assessment/Plan: This is a very pleasant 67 years old African American male diagnosed with multiple myeloma in November of 2009 status post several chemotherapy regimen and currently on treatment with Pomalyst and Decadron and tolerating it fairly well with no significant evidence for disease progression on his recent bloodwork.  The patient was admitted with septic shock most likely secondary to pneumonia. I would hold his treatment with Pomalyst and Decadron at this point. Continue current treatment for the pneumonia and septic shock as recommended by the critical care team. I would start the patient on Neupogen 480 mcg subcutaneously on daily basis until  there is significant improvement in his condition and resolution of the neutropenia. The patient and his family requested no CODE BLUE. Continue current supportive care. Thank you so much for taking good care of Mr. Steichen. I will continue to follow up the patient with you and assist in his  management.  LOS: 1 day    Brenee Gajda K. 08/15/2012

## 2012-08-15 NOTE — Progress Notes (Addendum)
   Called stat to bedside by [facility]  Patient just returned from CT and was in florid respiratory distress corresponding to temp spike. Wife is at bedside and she mentioned that patient is a DO NOT INTUBATE as expressed to the emergency department. However, the order he was in full code. I discussed with Dr. Si Gaul his oncologist felt that short-term ventilation would be appropriate and that further chemotherapy options are available. Subsequently discussed again with daughter Boone Master and patient wife. They felt that DO NOT INTUBATE status was do to patient's concerns of being "vegetable" and patient would be okay with a short-term intubation. They're aware that prognosis is bad. We discussed CPR and her this would not be his best interest nor would he want it.  At this time Differential diagnoses 1 acute respiratory failure include fever with pulmonary edema, sepsis syndrome, delirium due to sepsis and also lack of his baseline opioids.  Patient was then intubated, central line was placed and also bronchoscopy with lavage of the right middle lobe performed.   Subsequent plan includes - Full mechanical ventilator support with continuous sedation with propofol and fentanyl -CVP monitoring and diuresis as appropriate  -Check all labs including troponin, LFT, chemistry, CBC, =- Check sepsis biomarkers - Bronchoalveolar lavage 4 respiratory virus PCR, cell count and differential, Gram stain and culture, AFB and fungus - Continue current broad-spectrum antibiotic therapy -  Will hold off on starting anti-fungals for the moment per neutropenic algorithm - Family would like constant updates throughout night     The patient is critically ill with multiple organ systems failure and requires high complexity decision making for assessment and support, frequent evaluation and titration of therapies, application of advanced monitoring technologies and extensive interpretation of multiple  databases.   Critical Care Time devoted to patient care services described in this note is  45  Minutes independent of procedure time   Dr. Kalman Shan, M.D., [facility].C.P Pulmonary and Critical Care Medicine Staff Physician [facility] [place] [facility] Pager: [phone], If no answer or between  15:00h - 7:00h: call [phone]  08/15/2012 6:40 PM

## 2012-08-15 NOTE — Progress Notes (Signed)
eLink Physician-Brief Progress Note Patient Name: Rodney Green DOB: December 20, 1945 MRN: [id number]  Date of Service  08/15/2012   HPI/Events of Note  Hyperglycemia improved on insulin gtt.  Never in DKA.  Has diet order in place   eICU Interventions  Plan: D/C insulin gtt Placed on ICU q4 hour SSI moderate scale with plan to recheck blood sugar in 1 hour and use scale. Transition to Lantus/novolog in AM on rounds.   Intervention Category Intermediate Interventions: Hyperglycemia - evaluation and treatment  Thaddius Manes 08/15/2012, 2:11 AM

## 2012-08-15 NOTE — Progress Notes (Signed)
Called [facility] concerning fever of 102.9, given verbal order for Tylenol 650mg  . Applied ice packs under arms, groin and behind neck.

## 2012-08-15 NOTE — Progress Notes (Signed)
eLink Physician-Brief Progress Note Patient Name: Rodney Green DOB: April 30, 1945 MRN: [id number]  Date of Service  08/15/2012   HPI/Events of Note  Continued issues with resp distress.  PCXR shows some vascular congestion.  Patient with an anxiety component as well.  Repsonded briefly to MSO4.  Current sats on 100% NRB are 100% with RR of 35 and BP of 100/78 (84)   eICU Interventions  Plan: Check ABG Lasix 60 mg IV times one dose Ativan 1 mg IV  BiPAP for resp support   Intervention Category Intermediate Interventions: Respiratory distress - evaluation and management  Noma Quijas 08/15/2012, 5:27 AM

## 2012-08-15 NOTE — H&P (Signed)
PULMONARY  / CRITICAL CARE MEDICINE  Name: Rodney Green MRN: [id number] DOB: Apr 22, 1945    ADMISSION DATE:  08/14/2012 CONSULTATION DATE:  08/15/2012   REFERRING MD :  EDP PRIMARY SERVICE: PCCM  CHIEF COMPLAINT:  Hypotension and fever  BRIEF PATIENT DESCRIPTION: This a 67 year old African American male with refractory multiple myeloma IgA subtype admitted with neutropenia and bilateral atypical pneumonia with hypoxemia and septic shock. Critical care was asked to admit  SIGNIFICANT EVENTS / STUDIES:  6/23 - requiring intermittent bipap for resp support, fever  LINES / TUBES: PIV  CULTURES: 6/22 BCx2>>> 6/22 UC>>> 6/22 Resp Viral>>> 6/22 Sputum>>>   ANTIBIOTICS: Zosyn 6/22>>> Vancomycin 6/22>>> Azithromycin 6/22>>>   Myeloma IgA subtype 2009. The patient has had multiple rounds of different forms of chemotherapy over the past 5 years to include as follows:  PRIOR THERAPY:  1. Status post seven cycles of systemic chemotherapy with Revlimid and low-dose Betatron with partial response. The last dose was given June, 2010. 2. Status post chemotherapy with weekly Velcade in a patient on Decadron 40 mg on a weekly basis, status post 75 weekly doses. The last dose was given May 04, 2008. 3. Status post treatment again with Velcade and Decadron. The patient was awaiting stem cell transplant at [facility]. Last dose was given Jul 16, 2009. 4. The patient was not considered a good candidate for autologous stem cell transplant at that time secondary to poor mobilization of his stem cells.  5. Status post six cycles of systemic chemotherapy with Velcade, Cytoxan and Betatron. Last dose was given March 25, 2010. This continued secondary to his disease progression. 6. Status post ten cycles of systemic chemotherapy with Velcade. Doxil and Decadron. Last dose was given on 10/31/2010. Currently on hold because of concern about cardiac toxicity.  CURRENT THERAPY:   1. Pomalyst 4 mg by mouth daily for 21 days every 28 days, completed cycle #9 and here to start cycle #11 on 07/17/2012. 2. Decadron 40 mg by mouth weekly 3.   Neupogen    SUBJECTIVE:  RN reports concerns of fever up to 103 axillary, tachypnea, mild lethargy.  Received lasix overnight per [facility].    VITAL SIGNS: Temp:  [97.6 F (36.4 C)-102.9 F (39.4 C)] 98.6 F (37 C) (06/23 0400) Pulse Rate:  [48-97] 87 (06/23 0700) Resp:  [15-33] 28 (06/23 0700) BP: (86-170)/(53-105) 138/89 mmHg (06/23 0753) SpO2:  [85 %-100 %] 100 % (06/23 0753) FiO2 (%):  [30 %] 30 % (06/23 0753) Weight:  [188 lb 0.8 oz (85.3 kg)-189 lb 9.5 oz (86 kg)] 188 lb 0.8 oz (85.3 kg) (06/23 0500)   Vent Mode:  [-] BIPAP FiO2 (%):  [30 %] 30 % Set Rate:  [15 bmp] 15 bmp  INTAKE / OUTPUT: Intake/Output     06/22 0701 - 06/23 0700 06/23 0701 - 06/24 0700   P.O. 75    I.V. (mL/kg) 1359.4 (15.9)    IV Piggyback 600    Total Intake(mL/kg) 2034.4 (23.8)    Urine (mL/kg/hr) 3425 1250 (6.1)   Total Output 3425 1250   Net -1390.6 -1250          PHYSICAL EXAMINATION: General:  Ill-appearing male in no acute distress Neuro:  Awake and alert moves all 4, slow to respond but appropriate HEENT:  Dry mucus membranes, no jvd Cardiovascular:  S1S2 rrr, no m/r/g Lungs:  Tachypnea, lungs bilaterally with few scattered wheezes, upper airway noise Abdomen:  Soft nontender no organomegaly Musculoskeletal:  Diffuse  bone pain no joint deformity Skin:  Hot to touch, dry, no edema  LABS:  Recent Labs Lab 08/14/12 0800 08/14/12 0827 08/14/12 0858 08/14/12 0915 08/14/12 1025 08/15/12 0155 08/15/12 0359 08/15/12 0519  HGB 8.1*  --   --   --   --   --  7.6*  --   WBC 1.4*  --   --   --   --   --  1.5*  --   PLT 243  --   --   --   --   --  223  --   NA 132*  --   --   --   --  135 138  --   K 5.2*  --   --   --   --  4.3 4.4  --   CL 97  --   --   --   --  104 107  --   CO2 19  --   --   --   --  19 19  --    GLUCOSE 281*  --   --   --   --  140* 93  --   BUN 78*  --   --   --   --  56* 53*  --   CREATININE 3.35*  --   --   --   --  2.44* 2.41*  --   CALCIUM 9.1  --   --   --   --  8.5 8.6  --   INR  --   --   --  1.37  --   --   --   --   LATICACIDVEN  --   --  1.16  --   --   --   --   --   PROCALCITON  --  1.05  --   --   --   --   --   --   PROBNP  --   --   --   --  3290.0*  --   --   --   PHART  --   --   --   --   --   --   --  7.448  PCO2ART  --   --   --   --   --   --   --  26.6*  PO2ART  --   --   --   --   --   --   --  337.0*    Recent Labs Lab 08/15/12 0150 08/15/12 0412 08/15/12 0434 08/15/12 0452 08/15/12 0756  GLUCAP 139* 68* 68* 74 96    CXR: 6/23 low lung volumes, vascular congestion   ASSESSMENT / PLAN: Principal Problem:   Septic shock Active Problems:   Multiple myeloma in relapse   Acquired immunocompromised state   DM (diabetes mellitus), type 2 with renal complications   CKD (chronic kidney disease) stage 3, GFR 30-59 ml/min   Acute respiratory failure with hypoxia   Neutropenia   Severe sepsis   Opportunistic pneumonia   PULMONARY A:  Acute hypoxic respiratory failure -concern for bilateral atypical opportunistic pneumonia do to neutropenia organism not yet specified P:   Titrate O2 for sats 90-95% Bipap PRN for increased WOB Albuterol nebs F/u CXR in am   CARDIOVASCULAR A:  Septic shock - responded to volume.  Resolved 6/22. Note patient was continuing antihypertensive meds as the patient developed onset of illness HTN P:  F/U ECHO>> Continue  Atenolol, ASA  RENAL A:   Stage III chronic renal disease - baseline sr cr 1.6 (06/2012) P:   Monitor sr cr Consider repeat lasix am 6/23    GASTROINTESTINAL A:   No acute GI issues P:   PPI  HEMATOLOGIC A:  Multiple myeloma - IgA subtype refractory to treatment and not a candidate for bone marrow transplantation Neutropenia Severe immunosuppression P:  Oncology aware of pt's  admit, followed by Dr. Arbutus Ped  Hold Neupogen SQ Heparin   INFECTIOUS A:   Immunosuppressed state, neutropenia, opportunistic pneumonia, septic shock Fever  P:   Broad-spectrum antibiotics Respiratory viral screen CMV screening Consider addition of antifungals in setting of immune suppression Cooling measures  ENDOCRINE A:   Diabetes type 2   P:   ICU hyperglycemia protocol  Hold Lantus  NEUROLOGIC A:  Delirium - in setting of acute critical illness / fever P:   Monitor, supportive care PRN ativan  TODAY'S SUMMARY:  67 year old African American male with refractory multiple myeloma IgA subtype now presenting with neutropenia and concern for opportunistic pneumonia with septic shock. Continue broad-spectrum antibiotics, consider addition of antifungals. ECHO Pending, Resp viral panel pending.  Oncology aware of admit and Dr. Arbutus Ped has seen patient 6/23.    Canary Brim, NP-C [place] [facility] Pgr: [phone] or [phone]  08/15/2012, 9:25 AM   STAFF NOTE: I, Dr Lavinia Sharps have personally reviewed patient's available data, including medical history, events of note, physical examination and test results as part of my evaluation. I have discussed with resident/NP and other care providers such as pharmacist, RN and RRT.  In addition,  I personally evaluated patient and elicited key findings of acute hypoxemic resp faiolure improved but still needing 3-4L [place]. DDx Sepsis with ALI +/- CHF component. Definitely do BIPAP qhs for neurtropneic hypoixemic resp failure. Get CT and echo. Bronch 08/16/12 with BAL depending on course and CT findings. Wife updated.  Rest per NP/medical resident whose note is outlined above and that I agree with  The patient is critically ill with multiple organ systems failure and requires high complexity decision making for assessment and support, frequent evaluation and titration of therapies, application of advanced monitoring technologies  and extensive interpretation of multiple databases.   Critical Care Time devoted to patient care services described in this note is  30  Minutes.  Dr. Kalman Shan, M.D., [facility].C.P Pulmonary and Critical Care Medicine Staff Physician [facility] [place] [facility] Critical [facility] Pager: [phone], If no answer or between  15:00h - 7:00h: call [phone]  08/15/2012 12:25 PM

## 2012-08-15 NOTE — Progress Notes (Signed)
CARE MANAGEMENT NOTE 08/15/2012  Patient:  Rodney Green, Rodney Green   Account Number:  [id number]  Date Initiated:  08/15/2012  Documentation initiated by:  DAVIS,RHONDA  Subjective/Objective Assessment:   fever nuetropenia,pna by cxr, resp distress requiring bipap for support.     Action/Plan:   pt lives at home with spouse does have hx of refractory multiple myeloma IgA subtype   Anticipated DC Date:  08/18/2012   Anticipated DC Plan:  HOME/SELF CARE  In-house referral  NA      DC Planning Services  NA      [facility] Choice  NA   Choice offered to / List presented to:  NA   DME arranged  NA      DME agency  NA     HH arranged  NA      HH agency  NA   Status of service:  In process, will continue to follow Medicare Important Message given?  NA - LOS <3 / Initial given by admissions (If response is "NO", the following Medicare IM given date fields will be blank) Date Medicare IM given:   Date Additional Medicare IM given:    Discharge Disposition:    Per UR Regulation:  Reviewed for med. necessity/level of care/duration of stay  If discussed at Long Length of Stay Meetings, dates discussed:    Comments:  [id number]/Rhonda Earlene Plater, RN, BSN, CCM:  CHART REVIEWED AND UPDATED.  Next chart review due on [id number]. NO DISCHARGE NEEDS PRESENT AT THIS TIME. CASE MANAGEMENT [phone]

## 2012-08-16 ENCOUNTER — Inpatient Hospital Stay (HOSPITAL_COMMUNITY): Payer: 59

## 2012-08-16 ENCOUNTER — Other Ambulatory Visit: Payer: Self-pay | Admitting: Medical Oncology

## 2012-08-16 ENCOUNTER — Telehealth: Payer: Self-pay | Admitting: *Deleted

## 2012-08-16 DIAGNOSIS — C9000 Multiple myeloma not having achieved remission: Secondary | ICD-10-CM

## 2012-08-16 DIAGNOSIS — R4182 Altered mental status, unspecified: Secondary | ICD-10-CM

## 2012-08-16 LAB — GLUCOSE, CAPILLARY
Glucose-Capillary: 213 mg/dL — ABNORMAL HIGH (ref 70–99)
Glucose-Capillary: 276 mg/dL — ABNORMAL HIGH (ref 70–99)

## 2012-08-16 LAB — BASIC METABOLIC PANEL
BUN: 49 mg/dL — ABNORMAL HIGH (ref 6–23)
CO2: 23 mEq/L (ref 19–32)
CO2: 23 mEq/L (ref 19–32)
Calcium: 8.3 mg/dL — ABNORMAL LOW (ref 8.4–10.5)
Calcium: 8.4 mg/dL (ref 8.4–10.5)
GFR calc non Af Amer: 26 mL/min — ABNORMAL LOW (ref >90)
Glucose, Bld: 250 mg/dL — ABNORMAL HIGH (ref 70–99)
Glucose, Bld: 276 mg/dL — ABNORMAL HIGH (ref 70–99)
Sodium: 136 mEq/L (ref 135–145)
Sodium: 139 mEq/L (ref 135–145)

## 2012-08-16 LAB — CBC
Hemoglobin: 7 g/dL — ABNORMAL LOW (ref 13.0–17.0)
Hemoglobin: 7 g/dL — ABNORMAL LOW (ref 13.0–17.0)
MCH: 29.2 pg (ref 26.0–34.0)
MCH: 28.1 pg (ref 26.0–34.0)
MCHC: 32.4 g/dL (ref 30.0–36.0)
MCV: 90 fL (ref 78.0–100.0)
Platelets: 199 10*3/uL (ref 150–400)
RBC: 2.4 MIL/uL — ABNORMAL LOW (ref 4.22–5.81)
RBC: 2.49 MIL/uL — ABNORMAL LOW (ref 4.22–5.81)

## 2012-08-16 LAB — URINE CULTURE

## 2012-08-16 LAB — TROPONIN I
Troponin I: <0.3 ng/mL (ref <0.30)
Troponin I: <0.3 ng/mL (ref <0.30)

## 2012-08-16 LAB — CULTURE, RESPIRATORY W GRAM STAIN
Culture: NORMAL
Gram Stain: NONE SEEN

## 2012-08-16 LAB — PROCALCITONIN: Procalcitonin: 2.8 ng/mL

## 2012-08-16 LAB — PRO B NATRIURETIC PEPTIDE: Pro B Natriuretic peptide (BNP): 3509 pg/mL — ABNORMAL HIGH (ref 0–125)

## 2012-08-16 MED ORDER — PROMOTE PO LIQD
1000.0000 mL | ORAL | Status: DC
  Filled 2012-08-16 (×2): qty 1000

## 2012-08-16 MED ORDER — METHYLPREDNISOLONE SODIUM SUCC 125 MG IJ SOLR
60.0000 mg | Freq: Two times a day (BID) | INTRAMUSCULAR | Status: DC
  Administered 2012-08-16 – 2012-08-17 (×2): 60 mg via INTRAVENOUS
  Filled 2012-08-16 (×4): qty 0.96

## 2012-08-16 MED ORDER — PRO-STAT SUGAR FREE PO LIQD
60.0000 mL | Freq: Four times a day (QID) | ORAL | Status: DC
  Filled 2012-08-16 (×7): qty 60

## 2012-08-16 MED ORDER — POMALIDOMIDE 4 MG PO CAPS: 4.0000 mg | ORAL_CAPSULE | Freq: Every day | ORAL | Status: DC

## 2012-08-16 MED ORDER — SODIUM CHLORIDE 0.9 % IJ SOLN
3.0000 mL | Freq: Three times a day (TID) | INTRAMUSCULAR | Status: DC
  Administered 2012-08-16 – 2012-08-22 (×8): 3 mL via INTRAVENOUS

## 2012-08-16 MED ORDER — PANTOPRAZOLE SODIUM 40 MG IV SOLR
40.0000 mg | INTRAVENOUS | Status: DC
  Administered 2012-08-16 – 2012-08-20 (×5): 40 mg via INTRAVENOUS
  Filled 2012-08-16 (×5): qty 40

## 2012-08-16 MED ORDER — INSULIN ASPART 100 UNIT/ML ~~LOC~~ SOLN
0.0000 [IU] | SUBCUTANEOUS | Status: DC
  Administered 2012-08-16: 7 [IU] via SUBCUTANEOUS
  Administered 2012-08-16: 11 [IU] via SUBCUTANEOUS
  Administered 2012-08-16 – 2012-08-17 (×4): 4 [IU] via SUBCUTANEOUS
  Administered 2012-08-17: 7 [IU] via SUBCUTANEOUS
  Administered 2012-08-17: 20 [IU] via SUBCUTANEOUS
  Administered 2012-08-17: 4 [IU] via SUBCUTANEOUS
  Administered 2012-08-18: 7 [IU] via SUBCUTANEOUS
  Administered 2012-08-18: 4 [IU] via SUBCUTANEOUS
  Administered 2012-08-18: 3 [IU] via SUBCUTANEOUS
  Administered 2012-08-19: 20 [IU] via SUBCUTANEOUS
  Administered 2012-08-19: 3 [IU] via SUBCUTANEOUS
  Administered 2012-08-19: 11 [IU] via SUBCUTANEOUS

## 2012-08-16 NOTE — Progress Notes (Signed)
CRITICAL VALUE ALERT  Critical value received:  WBC = 1.0  Date of notification:  08/16/08  Time of notification:  05:46  Critical value read back:yes  Nurse who received alert:  Guinevere Scarlet  MD notified (1st page):  Dr. Koren Bound  Time of first page:  05:46   MD notified (2nd page):  Time of second page:  Responding MD:  Dr. Koren Bound  Time MD responded:  05:46

## 2012-08-16 NOTE — Progress Notes (Signed)
CRITICAL VALUE ALERT  Critical value received:  WBC 1.3  Date of notification:  08/16/2012  Time of notification:  1445 Critical value read back:yes  Nurse who received alert:  Lorrin Jackson RN MD notified (1st page):  Canary Brim NP  Time of first page:  1446  MD notified (2nd page):Dr Delford Field  Time of second page:1500  Responding MD: Dr. Delford Field  Time MD responded:  1501

## 2012-08-16 NOTE — Telephone Encounter (Signed)
Pomalyst sent to Dr Arbutus Ped for Berkley Harvey.

## 2012-08-16 NOTE — Progress Notes (Signed)
INITIAL NUTRITION ASSESSMENT  DOCUMENTATION CODES Per approved criteria  -Obesity Unspecified   INTERVENTION: - Initiate TF via OGT of Promote at 51ml/hr with Prostat 60ml QID which will provide 1040 calories and 135g protein. TF plus current Propofol rate will provide a total of 1447 calories which meets 75% estimated calorie needs, 109% estimated protein needs. This is 23 kcal/kg of ideal body weight.  - Will initiate adult enteral protocol - Will continue to monitor    NUTRITION DIAGNOSIS: Inadequate oral intake related to inability to eat as evidenced by NPO, ventilated.   Goal: Enteral nutrition to provide 60-70% of estimated calorie needs (22-25 kcals/kg ideal body weight) and 100% of estimated protein needs, based on ASPEN guidelines for permissive underfeeding in critically ill obese individuals.  Monitor:  Weights, labs, TF tolerance/advancement, vent status, Propofol rate  Reason for Assessment: Consult for TF, ventilated   67 y.o. male  Admitting Dx: Septic shock  ASSESSMENT: Pt admitted with a cough that started Thursday PTA. Pt with history of hepatitis C and multiple myeloma s/p multiple rounds of chemotherapy. Pt intubated yesterday for respiratory insufficiency, a central line was placed, and a bronchoscopy was performed. Wife present at bedside and reports pt with stable weight PTA, only 10 pound unintended weight loss in 2 years. Per conversation with RN, pt does not have TF access yet but plans to put in OGT later on this morning. Updated wife on plans for TF and answered questions.   Patient is currently intubated on ventilator support.  MV: 9.5 L/min Temp:Temp (24hrs), Avg:97.1 F (36.2 C), Min:94.1 F (34.5 C), Max:101.5 F (38.6 C)  Propofol: 15.4 ml/hr provides 407 calories    Height: Ht Readings from Last 1 Encounters:  08/14/12 5\' 5"  (1.651 m)    Weight: Wt Readings from Last 1 Encounters:  08/15/12 188 lb 0.8 oz (85.3 kg)    Ideal Body  Weight: 136 lb  % Ideal Body Weight: 138  Wt Readings from Last 10 Encounters:  08/15/12 188 lb 0.8 oz (85.3 kg)  07/14/12 186 lb 4.8 oz (84.505 kg)  06/16/12 189 lb 14.4 oz (86.138 kg)  06/15/12 192 lb 3.2 oz (87.181 kg)  05/18/12 189 lb 1.6 oz (85.775 kg)  04/20/12 199 lb 11.2 oz (90.583 kg)  03/23/12 192 lb 1.6 oz (87.136 kg)  03/08/12 192 lb 12.8 oz (87.454 kg)  02/25/12 194 lb 7.1 oz (88.2 kg)  01/11/12 194 lb 6.4 oz (88.179 kg)    Usual Body Weight: 188 lb  % Usual Body Weight: 100  BMI:  Body mass index is 31.29 kg/(m^2). Class I obesity  Estimated Nutritional Needs: Kcal: 1919 Protein: 124-136g Fluid: > 1.9L/day  Skin: +1 RLE, LLE edema  Diet Order: NPO  EDUCATION NEEDS: -No education needs identified at this time   Intake/Output Summary (Last 24 hours) at 08/16/12 0925 Last data filed at 08/16/12 0800  Gross per 24 hour  Intake 2686.85 ml  Output   3200 ml  Net -513.15 ml    Last BM: 6/23  Labs:   Recent Labs Lab 08/15/12 0359 08/15/12 1915 08/16/12 0517  NA 138 134* 136  K 4.4 4.6 4.4  CL 107 100 102  CO2 19 23 23   BUN 53* 48* 49*  CREATININE 2.41* 2.52* 2.49*  CALCIUM 8.6 8.6 8.3*  MG  --  1.8  --   PHOS  --  4.1  --   GLUCOSE 93 188* 250*    CBG (last 3)   Recent Labs  08/15/12 2315 08/16/12 0327 08/16/12 0803  GLUCAP 195* 212* 198*    Scheduled Meds: . albuterol  2.5 mg Nebulization Q6H  . antiseptic oral rinse  15 mL Mouth Rinse q12n4p  . aspirin  81 mg Oral Daily  . azithromycin  500 mg Intravenous Q24H  . chlorhexidine  15 mL Mouth Rinse BID  . filgrastim  480 mcg Subcutaneous Daily  . heparin  5,000 Units Subcutaneous Q8H  . insulin aspart  1-3 Units Subcutaneous Q4H  . LORazepam  0.5 mg Intravenous Once  . methylPREDNISolone (SOLU-MEDROL) injection  60 mg Intravenous Q12H  . pantoprazole  40 mg Oral Q1200  . piperacillin-tazobactam (ZOSYN)  IV  3.375 g Intravenous Q8H  . sodium chloride  3 mL Intravenous Q8H   . Travoprost (BAK Free)  1 drop Both Eyes QHS  . vancomycin  1,000 mg Intravenous Q24H    Continuous Infusions: . sodium chloride 500 mL (08/16/12 0541)  . fentaNYL infusion INTRAVENOUS 75 mcg/hr (08/15/12 2100)  . propofol 25.01 mcg/kg/min (08/16/12 9147)    Past Medical History  Diagnosis Date  . Allergy   . Diabetes mellitus   . Hypertension   . Hepatitis C   . Multiple myeloma   . Multiple myeloma   . Chronic kidney disease   . Shortness of breath     Past Surgical History  Procedure Laterality Date  . Circumcision    . Hand surgery    . Abdominal abcess       Levon Hedger MS, RD, LDN [phone] Pager [phone] After Hours Pager

## 2012-08-16 NOTE — Clinical Social Work Psychosocial (Signed)
Clinical Social Work Department BRIEF PSYCHOSOCIAL ASSESSMENT 08/16/2012  Patient:  Rodney Green, Rodney Green     Account Number:  [id number]     Admit date:  08/14/2012  Clinical Social Worker:  Jodelle Red  Date/Time:  08/16/2012 11:46 AM  Referred by:  Physician  Date Referred:  08/16/2012 Referred for  Crisis Intervention   Other Referral:   ASSIST WITH GETTING IN CONTACT WITH Pt's [place] [employer]   Interview type:  Family Other interview type:   conversation with wife, daughter and care team    PSYCHOSOCIAL DATA Living Status:  WIFE Admitted from facility:   Level of care:   Primary support name:  [employer] Primary support relationship to patient:  SPOUSE Degree of support available:   good from wife, daughter, Shanda Bumps and brother, Gabriel Rung.  Son Hassel is in [employer].    CURRENT CONCERNS Current Concerns  Adjustment to Illness   Other Concerns:   ASSIST WITH GETTING IN CONTACT WITH Pt's son that [place] [employer]    SOCIAL WORK ASSESSMENT / PLAN CSW provided support to wife and daughter. Both have good support systems to assist them. CSW spoke with wife to get details re. son in order to get [employer] to get word to son. CSW contacted [employer] on their behalf. They are working on contacting Pt on his ship. [employer] Ref # [id number]. Family aware of how to call and check if they would like.   Assessment/plan status:  Psychosocial Support/Ongoing Assessment of Needs Other assessment/ plan:   Information/referral to community resources:   contact [employer]    PATIENT'S/FAMILY'S RESPONSE TO PLAN OF CARE: Family very appreciative and agree to CSW following. CSW will follow up with [employer] as needed and can further assist and coordinate communication.Family aware of how to contact as well.  CSW will follow.   Doreen Salvage, LCSW ICU/Stepdown Clinical Social Worker [facility] Cell [phone] Hours 8am-1200pm M-F

## 2012-08-16 NOTE — Progress Notes (Signed)
PULMONARY  / CRITICAL CARE MEDICINE  Name: Rodney Green MRN: [id number] DOB: February 28, 1945    ADMISSION DATE:  08/14/2012 CONSULTATION DATE: 08/15/2012   REFERRING MD : EDP  PRIMARY SERVICE: PCCM   CHIEF COMPLAINT: Hypotension and fever   BRIEF PATIENT DESCRIPTION: This a 67 year old African American male with refractory multiple myeloma IgA subtype (see 6/23 PCCM note for complete list or prior treatments) admitted with neutropenia and bilateral atypical pneumonia with hypoxemia and septic shock. Critical care was asked to admit   SIGNIFICANT EVENTS / STUDIES:  6/23 - requiring intermittent bipap for resp support, fever  6/23 CT of Chest - no lobar consolidation, pulmonary artery enlargement, osseous findings most consistent with the clinical history of myeloma 6/23 ECHO - Ef 60-65%, PA peak pressures 31 6/23 PM - Developed worsening resp distress during CT scan requiring intubation and CVC placement  LINES / TUBES:  PIV  6/23 Foley>> 6/23 CVC L IJ>>> 6/23 ETT>>>  CULTURES:  6/22 BCx2>>>  6/22 UC>>> Enterobacter Aerogenes 6/22 Resp Vira from sputml>>> Parainfluenza 3 6/22 Sputum>>>normal flora 6/23 Sputum>>> 6/23 Urine Step Pneumo >> Negative 6/23 Urin Legionella >> ............... 6/23 BRONCH RML BAL (DR Raamaswamy) - Cell count, PCR virus, AFB, Fungus   ANTIBIOTICS:  Zosyn 6/22>>>  Vancomycin 6/22>>> 6/24 Azithromycin 6/22>>> 6/24  SUBJECTIVE:   VITAL SIGNS: Temp:  [94.1 F (34.5 C)-101.5 F (38.6 C)] 94.1 F (34.5 C) (06/24 0700) Pulse Rate:  [54-95] 55 (06/24 0700) Resp:  [17-32] 18 (06/24 0700) BP: (65-167)/(46-105) 97/71 mmHg (06/24 0700) SpO2:  [90 %-100 %] 97 % (06/24 0700) FiO2 (%):  [15 %-60 %] 40 % (06/24 0510)  HEMODYNAMICS: CVP:  [4 mmHg-6 mmHg] 6 mmHg  VENTILATOR SETTINGS: Vent Mode:  [-] PRVC FiO2 (%):  [15 %-60 %] 40 % Set Rate:  [18 bmp] 18 bmp Vt Set:  [500 mL] 500 mL PEEP:  [5 cmH20] 5 cmH20 Plateau Pressure:  [15 cmH20-19 cmH20] 15  cmH20  INTAKE / OUTPUT: Intake/Output     06/23 0701 - 06/24 0700 06/24 0701 - 06/25 0700   P.O. 300    I.V. (mL/kg) 1726.6 (20.2)    IV Piggyback 600    Total Intake(mL/kg) 2626.6 (30.8)    Urine (mL/kg/hr) 4300 (2.1)    Total Output 4300     Net -1673.5            PHYSICAL EXAMINATION:  General: Ill-appearing male in moderate distress on MV Neuro: Awake and follows simple commands, nods app HEENT: Dry mucus membranes, no jvd  Cardiovascular: S1S2 rrr, no m/r/g, pulses +1  Lungs: Tachypnea, lungs clear bilaterally. No wheezes audible Abdomen: Soft nontender no organomegaly, BS active  Musculoskeletal: MAE, strength +4 Skin: Cool to touch, intact  LABS:  Recent Labs Lab 08/14/12 0827 08/14/12 0858 08/14/12 0915 08/14/12 1025  08/15/12 0359 08/15/12 0519 08/15/12 1651 08/15/12 1915 08/15/12 1923 08/16/12 0517  HGB  --   --   --   --   --  7.6*  --   --  7.4*  --  7.0*  WBC  --   --   --   --   --  1.5*  --   --  1.7*  --  1.0*  PLT  --   --   --   --   --  223  --   --  PLATELET CLUMPS NOTED ON SMEAR, COUNT APPEARS ADEQUATE  --  199  NA  --   --   --   --   < >  138  --   --  134*  --  136  K  --   --   --   --   < > 4.4  --   --  4.6  --  4.4  CL  --   --   --   --   < > 107  --   --  100  --  102  CO2  --   --   --   --   < > 19  --   --  23  --  23  GLUCOSE  --   --   --   --   < > 93  --   --  188*  --  250*  BUN  --   --   --   --   < > 53*  --   --  48*  --  49*  CREATININE  --   --   --   --   < > 2.41*  --   --  2.52*  --  2.49*  CALCIUM  --   --   --   --   < > 8.6  --   --  8.6  --  8.3*  MG  --   --   --   --   --   --   --   --  1.8  --   --   PHOS  --   --   --   --   --   --   --   --  4.1  --   --   AST  --   --   --   --   --   --   --   --  12  --   --   ALT  --   --   --   --   --   --   --   --  9  --   --   ALKPHOS  --   --   --   --   --   --   --   --  63  --   --   BILITOT  --   --   --   --   --   --   --   --  0.6  --   --   PROT  --   --    --   --   --   --   --   --  7.0  --   --   ALBUMIN  --   --   --   --   --   --   --   --  2.2*  --   --   INR  --   --  1.37  --   --   --   --   --  1.21  --   --   LATICACIDVEN  --  1.16  --   --   --   --   --   --  0.9  --   --   TROPONINI  --   --   --   --   --   --   --   --  <0.30  --  <0.30  PROCALCITON 1.05  --   --   --   --   --   --   --  3.10  --  2.80  PROBNP  --   --   --  3290.0*  --   --   --   --  4436.0*  --  3509.0*  PHART  --   --   --   --   --   --  7.448 7.341*  --  7.387  --   PCO2ART  --   --   --   --   --   --  26.6* 40.6  --  36.3  --   PO2ART  --   --   --   --   --   --  337.0* 99.4  --  97.1  --   < > = values in this interval not displayed.  Recent Labs Lab 08/15/12 0756 08/15/12 1138 08/15/12 1707 08/15/12 1959 08/15/12 2315  GLUCAP 96 88 129* 174* 195*    CXR: 6/24 - improved aeration of RML and RLL, improved bibasilar atelectasis. ETT and CVC in good position  ASSESSMENT / PLAN:  PULMONARY  A:  Acute hypoxic respiratory failure - concern for bilateral atypical opportunistic pneumonia do to neutropenia organism not yet specified. Other differentials include pulmonary edema given ProBNP elevated to 3500. Developed worsening respiratory distress s/p CT on 6/23 requiring MV.  P:  -Full vent support - likely attempt extubation tomorrow -Daily SBT, respiratory mechanics -Titrate FiO2 to maintain sats of 90-95%  -Continue nebs scheduled and PRN -F/u CXR and ABG in am  -Reduce steroids -See infection section  CARDIOVASCULAR  A:  Septic shock - responded to volume, not requiring vasopressor support as of yet. Lactic acid .9, last CVP 6. Troponin negative Pulmonary edema - improved although further diuresis limited by elevated Cr. Hx of HTN P:  -Tele monitoring -Continue CVP monitoring -Conservative fluid resuscitation should hypotension develop  RENAL  A:  Stage III chronic renal disease - Cr 3.35 on admission, baseline sr cr 1.6  (06/2012)  P:  -Monitor I&0  -Monitor BMP -Would consider further diureses 6/24 -Renal US   GASTROINTESTINAL  A:  No acute GI issues  P:  -PPI GI px -Place OG -Consult nutrition for TF  HEMATOLOGIC  A:  Multiple myeloma - IgA subtype refractory to treatment and not a candidate for bone marrow transplantation  Neutropenia - followed by Dr. Arbutus Ped. Hypothermic on admission  Severe immunosuppression   P:  -SB q Heparin -Monitor CBC  INFECTIOUS  A:  Immunosuppressed state  neutropenia - with hypothermia Opportunistic pneumonia - Parainfluenza 3 positive UTI - UC positive for Enterobacter Aerogenes Septic shock - resolved, PCT mildly elevated  P:  -Renal US as above -Taper ABX as above -Continue to follow cultures -Consider addition of antifungals via neutropenia protocol  ENDOCRINE  A:  Diabetes type 2  P:  -CBG monitoring -SSI insulin   NEUROLOGIC  A:  Delirium - in setting of acute critical illness / fever  P:  -Monitor -supportive care  -Propofol and fentanyl infusion while intubated -Consider precedex if agitation precludes extubation  -Daily WUA  TODAY'S SUMMARY:  66 year old African American male with refractory multiple myeloma IgA subtype presenting with neutropenia and concern for opportunistic pneumonia with septic shock. Culture data noted, ABX narrowed as above. Renal US ordered. Weaning ventilator with plan to extubate tomorrow.  I have personally obtained a history, examined the patient, evaluated laboratory and imaging results, formulated the assessment and plan and placed orders.     Note by Kenard Gower NP student and Roslynn Amble NP 08/16/2012, 8:26 AM  STAFF NOTE: I, Dr Lavinia Sharps have personally reviewed patient's available data, including medical history, events of note, physical examination and test results as part of my evaluation. I have discussed with resident/NP and other care providers such as pharmacist, RN and RRT.  In addition,   I personally evaluated patient and elicited key findings of acute resp failure due to sepsis due to Enterobacter UTI and PIV-3 resp infeection. He is hemodynamically stable, and creatinine holding and echo/troponin okay. He did wean but is agitated on WUA. Will hold off extubation. Will get renal US. Wife and daughter updated.  Rest per NP/medical resident whose note is outlined above and that I agree with  The patient is critically ill with multiple organ systems failure and requires high complexity decision making for assessment and support, frequent evaluation and titration of therapies, application of advanced monitoring technologies and extensive interpretation of multiple databases.   Critical Care Time devoted to patient care services described in this note is  35  Minutes indepdendent of NP TIme.  Dr. Kalman Shan, M.D., [facility].C.P Pulmonary and Critical Care Medicine Staff Physician [facility] Pager: [phone], If no answer or between  15:00h - 7:00h: call [phone]  08/16/2012 10:31 AM

## 2012-08-16 NOTE — Progress Notes (Signed)
No show. He is in the hospital.

## 2012-08-16 NOTE — Telephone Encounter (Signed)
[facility] faxed Pomalyst refill request.  Request to provider for review.

## 2012-08-16 NOTE — Progress Notes (Signed)
Attempted to place OG per order to begin tube ffeding and medication.  Attempted to place OG per two different RN's without success.  Meeting  resistance right before entering stomach.  Dr. Bonney Aid notified and stated to stop trying for now.  Patient may be extubated in am.  Lorrin Jackson RN

## 2012-08-17 ENCOUNTER — Other Ambulatory Visit: Payer: Self-pay | Admitting: Medical Oncology

## 2012-08-17 DIAGNOSIS — C9000 Multiple myeloma not having achieved remission: Secondary | ICD-10-CM

## 2012-08-17 LAB — GLUCOSE, CAPILLARY
Glucose-Capillary: 195 mg/dL — ABNORMAL HIGH (ref 70–99)
Glucose-Capillary: 185 mg/dL — ABNORMAL HIGH (ref 70–99)
Glucose-Capillary: 159 mg/dL — ABNORMAL HIGH (ref 70–99)

## 2012-08-17 LAB — BLOOD GAS, ARTERIAL
Acid-base deficit: 2.5 mmol/L — ABNORMAL HIGH (ref 0.0–2.0)
Bicarbonate: 21.8 meq/L (ref 20.0–24.0)
Drawn by: 39805
FIO2: 0.4 %
MECHVT: 500 mL
O2 Saturation: 97.5 %
PEEP: 5 cmH2O
Patient temperature: 97.3
RATE: 18 {breaths}/min
TCO2: 19.8 mmol/L (ref 0–100)
pCO2 arterial: 37 mmHg (ref 35.0–45.0)
pH, Arterial: 7.384 (ref 7.350–7.450)
pO2, Arterial: 112 mmHg — ABNORMAL HIGH (ref 80.0–100.0)

## 2012-08-17 LAB — BASIC METABOLIC PANEL WITH GFR
BUN: 52 mg/dL — ABNORMAL HIGH (ref 6–23)
CO2: 24 meq/L (ref 19–32)
Calcium: 8.7 mg/dL (ref 8.4–10.5)
Chloride: 107 meq/L (ref 96–112)
Creatinine, Ser: 2.1 mg/dL — ABNORMAL HIGH (ref 0.50–1.35)
GFR calc Af Amer: 37 mL/min — ABNORMAL LOW
GFR calc non Af Amer: 32 mL/min — ABNORMAL LOW
Glucose, Bld: 218 mg/dL — ABNORMAL HIGH (ref 70–99)
Potassium: 4.1 meq/L (ref 3.5–5.1)
Sodium: 141 meq/L (ref 135–145)

## 2012-08-17 LAB — PROCALCITONIN: Procalcitonin: 2.11 ng/mL

## 2012-08-17 LAB — RESPIRATORY VIRUS PANEL
Influenza A H3: NOT DETECTED
Influenza A: NOT DETECTED
Influenza B: NOT DETECTED
Parainfluenza 1: NOT DETECTED
Respiratory Syncytial Virus A: NOT DETECTED
Respiratory Syncytial Virus B: NOT DETECTED

## 2012-08-17 LAB — CBC
HCT: 22.9 % — ABNORMAL LOW (ref 39.0–52.0)
Hemoglobin: 7.3 g/dL — ABNORMAL LOW (ref 13.0–17.0)
WBC: 1.8 10*3/uL — ABNORMAL LOW (ref 4.0–10.5)

## 2012-08-17 MED ORDER — DEXMEDETOMIDINE HCL IN NACL 400 MCG/100ML IV SOLN
0.4000 ug/kg/h | INTRAVENOUS | Status: DC
  Filled 2012-08-17: qty 100

## 2012-08-17 MED ORDER — INSULIN GLARGINE 100 UNIT/ML ~~LOC~~ SOLN
20.0000 [IU] | Freq: Every day | SUBCUTANEOUS | Status: DC
  Administered 2012-08-17 – 2012-08-22 (×6): 20 [IU] via SUBCUTANEOUS
  Filled 2012-08-17 (×8): qty 0.2

## 2012-08-17 MED ORDER — METHYLPREDNISOLONE SODIUM SUCC 125 MG IJ SOLR
60.0000 mg | INTRAMUSCULAR | Status: DC
  Administered 2012-08-18: 60 mg via INTRAVENOUS
  Filled 2012-08-17 (×2): qty 0.96

## 2012-08-17 MED ORDER — FENTANYL CITRATE 0.05 MG/ML IJ SOLN: 12.5000 ug | INTRAMUSCULAR | Status: DC | PRN

## 2012-08-17 MED ORDER — OXYCODONE HCL ER 15 MG PO T12A
15.0000 mg | EXTENDED_RELEASE_TABLET | Freq: Two times a day (BID) | ORAL | Status: DC
  Administered 2012-08-17 (×2): 15 mg via ORAL
  Filled 2012-08-17 (×2): qty 1

## 2012-08-17 NOTE — Telephone Encounter (Signed)
Pomalyst on hold 08/17/12

## 2012-08-17 NOTE — Progress Notes (Signed)
Patient extubated to a 2lpm [place]. Cuff leak done prior to extubation. Patient tolerated procedure well. O2 sat 97%, HR 77, RR 23. Patient able to verbalize well.Will continue to monitor

## 2012-08-17 NOTE — Clinical Social Work Note (Signed)
CSW met with Pt, wife and daughter to check in after Pt was extubated. Pt was cracking jokes and cutting up. Family requested update be sent through [employer] to Pt's son about his condition. CSW got word through [employer]. Family coping well currently and are hopeful for continued improvement. They are aware to contact CSW for other needs and CSW to follow.   Doreen Salvage, LCSW ICU/Stepdown Clinical Social Worker [facility] Cell [phone] Hours 8am-1200pm M-F

## 2012-08-17 NOTE — Progress Notes (Addendum)
PULMONARY  / CRITICAL CARE MEDICINE  Name: Rodney Green MRN: [id number] DOB: [birth year]    ADMISSION DATE:  08/14/2012 CONSULTATION DATE: 08/15/2012   REFERRING MD : EDP  PRIMARY SERVICE: PCCM   CHIEF COMPLAINT: Hypotension and fever   BRIEF PATIENT DESCRIPTION: This a 67 year old African American male with refractory multiple myeloma IgA subtype (see 6/23 PCCM note for complete list or prior treatments) admitted with neutropenia and bilateral atypical pneumonia with hypoxemia and septic shock. Critical care was asked to admit   SIGNIFICANT EVENTS / STUDIES:  6/23 - requiring intermittent bipap for resp support, fever  6/23 - CT of Chest - no lobar consolidation, pulmonary artery enlargement, osseous findings most consistent with the clinical history of myeloma 6/23 - ECHO - Ef 60-65%, PA peak pressures 31 6/23 - PM Developed worsening resp distress during CT scan requiring intubation and CVC placement  LINES / TUBES:  PIV  6/23 Foley>> 6/23 CVC L IJ>>> 6/23 ETT>>>6/25  CULTURES:  6/22 BCx2>>>  6/22 UC>>> Enterobacter Aerogenes 6/22 Resp Vira from sputml>>> Parainfluenza 3 6/22 Sputum>>>normal flora 6/23 Sputum>>> Pseudomonas from BAL 6/23 Urine Step Pneumo >> Negative 6/23 Urin Legionella >> negative ............... 6/23 BRONCH RML BAL (DR Raamaswamy) - Cell count, PCR virus, AFB, Fungus   ANTIBIOTICS:  Zosyn 6/22>>>  Vancomycin 6/22>>> 6/24 Azithromycin 6/22>>> 6/24  SUBJECTIVE:   VITAL SIGNS: Temp:  [95.2 F (35.1 C)-97.5 F (36.4 C)] 97.2 F (36.2 C) (06/25 0600) Pulse Rate:  [56-68] 63 (06/25 0802) Resp:  [17-24] 20 (06/25 0802) BP: (85-143)/(16-88) 135/88 mmHg (06/25 0802) SpO2:  [96 %-100 %] 99 % (06/25 0802) FiO2 (%):  [40 %] 40 % (06/25 0802) Weight:  [81.3 kg (179 lb 3.7 oz)] 81.3 kg (179 lb 3.7 oz) (06/25 0500)  HEMODYNAMICS: CVP:  [5 mmHg-10 mmHg] 10 mmHg  VENTILATOR SETTINGS: Vent Mode:  [-] CPAP FiO2 (%):  [40 %] 40 % Set Rate:  [18 bmp]  18 bmp Vt Set:  [500 mL] 500 mL PEEP:  [5 cmH20] 5 cmH20 Pressure Support:  [10 cmH20] 10 cmH20 Plateau Pressure:  [14 cmH20-16 cmH20] 16 cmH20  INTAKE / OUTPUT: Intake/Output     06/24 0701 - 06/25 0700 06/25 0701 - 06/26 0700   P.O.     I.V. (mL/kg) 924.6 (11.4)    IV Piggyback 375    Total Intake(mL/kg) 1299.6 (16)    Urine (mL/kg/hr) 1795 (0.9)    Total Output 1795     Net -495.4            PHYSICAL EXAMINATION:  General: Ill-appearing male in no distress on MV Neuro: Awake and follows simple commands, nods app HEENT: Dry mucus membranes, no jvd  Cardiovascular: S1S2 rrr, no m/r/g, pulses +1  Lungs: lungs clear bilaterally. No wheezes audible Abdomen: Soft nontender no organomegaly, BS active  Musculoskeletal: MAE, strength +4 Skin: pink, warm, intact  LABS:  Recent Labs Lab 08/16/12 0517 08/16/12 1400 08/17/12 0400  NA 136 139 141  K 4.4 4.3 4.1  CL 102 105 107  CO2 23 23 24   BUN 49* 50* 52*  CREATININE 2.49* 2.33* 2.10*  GLUCOSE 250* 276* 218*    Recent Labs Lab 08/16/12 0517 08/16/12 1400 08/17/12 0400  HGB 7.0* 7.0* 7.3*  HCT 21.6* 22.2* 22.9*  WBC 1.0* 1.3* 1.8*  PLT 199 229 235    CXR: 6/25 - improved aeration of RML and RLL, improved bibasilar atelectasis. ETT and CVC in good position  ASSESSMENT / PLAN:  PULMONARY  A:  Acute hypoxic respiratory failure - concern for bilateral atypical opportunistic pneumonia do to neutropenia organism not yet specified. Other differentials include pulmonary edema given ProBNP elevated to 3500. Developed worsening respiratory distress s/p CT on 6/23 requiring MV.  P:  -Wean to extubation 6/25 am -Titrate FiO2 to maintain sats of 90-95%  -Continue nebs scheduled and PRN -See infection section  CARDIOVASCULAR  A:  Septic shock - responded to volume, not requiring vasopressor support as of yet. Lactic acid .9, last CVP 6. Troponin negative Pulmonary edema - improved although further diuresis limited by  elevated Cr. Hx of HTN P:  -Tele monitoring -D/c CVP monitoring   RENAL  A:  Stage III chronic renal disease - Cr 3.35 on admission, baseline sr cr 1.6 (06/2012)  P:  -Monitor I&0  -Monitor BMP   GASTROINTESTINAL  A:  No acute GI issues  P:  -PPI GI px -NPO x4 hours post extubation, then can resume diet if tolerates PO's  HEMATOLOGIC  A:  Multiple myeloma - IgA subtype refractory to treatment and not a candidate for bone marrow transplantation  Neutropenia - followed by Dr. Arbutus Ped. Hypothermic on admission  Severe immunosuppression   P:  -SB q Heparin -Monitor CBC -Continue neupogen  INFECTIOUS  A:  Immunosuppressed state  neutropenia - with hypothermia Opportunistic pneumonia - Parainfluenza 3 positive and Pseudomonas UTI - UC positive for Enterobacter Aerogenes Septic shock - resolved, PCT mildly elevated  P:  -Continue Zosyn for pseudomonas, will narrow once sensitivities returned  ENDOCRINE  A:  Diabetes type 2  - hyperglycemia in setting of high dose steroids P:  -CBG monitoring -SSI insulin -resume reduced dose lantus @ 20   NEUROLOGIC  A:  Delirium - in setting of acute critical illness / fever  P:  -Monitor -supportive care  -Continue IV fentanyl at 10 mcg/hr (total is 257mcg/24h post extubation to replace home dose of oxycontin 40mg  bid.  Transition back to 1/2 home dose once tolerating PO's   TODAY'S SUMMARY:  67 year old African American male with refractory multiple myeloma IgA subtype presenting with neutropenia and concern for opportunistic pneumonia with septic shock. Culture data noted, will continue his course of zosyn. 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 I have discussed with resident/NP and other care providers such as pharmacist, RN and RRT.  In addition,  I personally evaluated patient and elicited key findings of acute resp failure due to sepsis (severe) due to PIV3/pseudomonas lung infection and enterobacter UTI in settting of possible opioid induced neurotoxicity  V withdrawal (was agitated, in renal failure),. Doing real well today on low dose fentanyl gtt an extubated. Will change to po oxycontin when more stable. NArrow abx when allsensitiviest back. STill not out of woods. Wife updated.  Rest per NP/medical resident whose note is outlined above and that I agree with  The patient is critically ill with multiple organ systems failure and requires high complexity decision making for assessment and support, frequent evaluation and titration of therapies, application of advanced monitoring technologies and extensive interpretation of multiple databases.   Critical Care Time devoted to patient care services described in this  note is  35  Minutes independent of NP time  Dr. Kalman Shan, M.D., [facility].C.P Pulmonary and Critical Care Medicine Staff Physician [facility] [place] [facility] Pager: [phone], If no answer or between  15:00h - 7:00h: call [phone]  08/17/2012 11:13 AM

## 2012-08-17 NOTE — Progress Notes (Signed)
Offered support. Family present: patient's sister Darden Amber, wife [place], and daughter Shanda Bumps. They requested prayer; prayed. Will continue to follow and support. Wife said patient also goes by Roe Coombs, his middle name.

## 2012-08-17 NOTE — Telephone Encounter (Signed)
[place] on hold

## 2012-08-18 ENCOUNTER — Inpatient Hospital Stay (HOSPITAL_COMMUNITY): Payer: 59

## 2012-08-18 LAB — CBC
Hemoglobin: 7.6 g/dL — ABNORMAL LOW (ref 13.0–17.0)
RBC: 2.62 MIL/uL — ABNORMAL LOW (ref 4.22–5.81)
WBC: 2.2 10*3/uL — ABNORMAL LOW (ref 4.0–10.5)

## 2012-08-18 LAB — GLUCOSE, CAPILLARY
Glucose-Capillary: 101 mg/dL — ABNORMAL HIGH (ref 70–99)
Glucose-Capillary: 141 mg/dL — ABNORMAL HIGH (ref 70–99)
Glucose-Capillary: 170 mg/dL — ABNORMAL HIGH (ref 70–99)
Glucose-Capillary: 187 mg/dL — ABNORMAL HIGH (ref 70–99)
Glucose-Capillary: 53 mg/dL — ABNORMAL LOW (ref 70–99)
Glucose-Capillary: 81 mg/dL (ref 70–99)

## 2012-08-18 LAB — BASIC METABOLIC PANEL
GFR calc Af Amer: 41 mL/min — ABNORMAL LOW (ref >90)
GFR calc non Af Amer: 35 mL/min — ABNORMAL LOW (ref >90)
Potassium: 3.5 mEq/L (ref 3.5–5.1)
Sodium: 144 mEq/L (ref 135–145)

## 2012-08-18 LAB — CULTURE, BAL-QUANTITATIVE W GRAM STAIN

## 2012-08-18 LAB — TRIGLYCERIDES: Triglycerides: 206 mg/dL — ABNORMAL HIGH (ref <150)

## 2012-08-18 MED ORDER — POTASSIUM CHLORIDE CRYS ER 20 MEQ PO TBCR
20.0000 meq | EXTENDED_RELEASE_TABLET | ORAL | Status: AC
  Administered 2012-08-18 (×2): 20 meq via ORAL
  Filled 2012-08-18 (×2): qty 1

## 2012-08-18 MED ORDER — IPRATROPIUM BROMIDE 0.02 % IN SOLN
0.5000 mg | Freq: Four times a day (QID) | RESPIRATORY_TRACT | Status: DC | PRN
  Administered 2012-08-18: 0.5 mg via RESPIRATORY_TRACT
  Filled 2012-08-18: qty 2.5

## 2012-08-18 MED ORDER — AMLODIPINE BESYLATE 5 MG PO TABS
5.0000 mg | ORAL_TABLET | Freq: Every day | ORAL | Status: DC
  Administered 2012-08-18 – 2012-08-19 (×2): 5 mg via ORAL
  Filled 2012-08-18 (×3): qty 1

## 2012-08-18 MED ORDER — CIPROFLOXACIN HCL 500 MG PO TABS
500.0000 mg | ORAL_TABLET | Freq: Two times a day (BID) | ORAL | Status: DC
  Administered 2012-08-18 – 2012-08-22 (×8): 500 mg via ORAL
  Filled 2012-08-18 (×13): qty 1

## 2012-08-18 MED ORDER — SODIUM CHLORIDE 0.9 % IJ SOLN
10.0000 mL | INTRAMUSCULAR | Status: DC | PRN
  Administered 2012-08-21 (×3): 10 mL

## 2012-08-18 MED ORDER — ATENOLOL 50 MG PO TABS
50.0000 mg | ORAL_TABLET | Freq: Two times a day (BID) | ORAL | Status: DC
  Administered 2012-08-18: 50 mg via ORAL
  Filled 2012-08-18 (×4): qty 1

## 2012-08-18 MED ORDER — OXYCODONE HCL ER 10 MG PO T12A
10.0000 mg | EXTENDED_RELEASE_TABLET | Freq: Two times a day (BID) | ORAL | Status: DC
  Administered 2012-08-18 – 2012-08-22 (×8): 10 mg via ORAL
  Filled 2012-08-18 (×9): qty 1

## 2012-08-18 MED ORDER — OXYCODONE HCL ER 20 MG PO T12A: 20.0000 mg | EXTENDED_RELEASE_TABLET | Freq: Two times a day (BID) | ORAL | Status: DC

## 2012-08-18 MED ORDER — METHYLPREDNISOLONE SODIUM SUCC 125 MG IJ SOLR
60.0000 mg | INTRAMUSCULAR | Status: DC
  Administered 2012-08-19: 60 mg via INTRAVENOUS
  Filled 2012-08-18 (×2): qty 0.96

## 2012-08-18 NOTE — Progress Notes (Addendum)
PULMONARY  / CRITICAL CARE MEDICINE  Name: Rodney Green MRN: [id number] DOB: 1945-07-06    ADMISSION DATE:  08/14/2012 CONSULTATION DATE: 08/15/2012   REFERRING MD : EDP  PRIMARY SERVICE: PCCM   CHIEF COMPLAINT: Hypotension and fever   BRIEF PATIENT DESCRIPTION: This a 67 year old African American male with refractory multiple myeloma IgA subtype (see 6/23 PCCM note for complete list or prior treatments) admitted with neutropenia and bilateral atypical pneumonia with hypoxemia and septic shock. Critical care was asked to admit   SIGNIFICANT EVENTS / STUDIES:  6/23 - requiring intermittent bipap for resp support, fever  6/23 - CT of Chest - no lobar consolidation, pulmonary artery enlargement, osseous findings most consistent with the clinical history of myeloma 6/23 - ECHO - Ef 60-65%, PA peak pressures 31 6/23 - PM Developed worsening resp distress during CT scan requiring intubation and CVC placement  LINES / TUBES:  PIV  6/23 Foley>> 6/26 6/23 CVC L IJ>>> 6/26 6/23 ETT>>>6/25  CULTURES:  6/22 BCx2>>>  6/22 UC>>> Enterobacter Aerogenes 6/22 Resp Vira from sputml>>> Parainfluenza 3 6/22 Sputum>>>normal flora 6/23 Sputum>>> Pseudomonas from BAL 6/23 Urine Step Pneumo >> Negative 6/23 Urin Legionella >> negative ............... 6/23 BRONCH RML BAL (Dr. Marchelle Gearing) - Cell count, PCR virus, AFB, Fungus   ANTIBIOTICS:  Ciprofloxacin 6/26>>> Zosyn 6/22>>> 6/26 Vancomycin 6/22>>> 6/24 Azithromycin 6/22>>> 6/24  SUBJECTIVE:   Feels better, breathing is good.   Episode of hypoglycemia overnight   VITAL SIGNS: Temp:  [97.5 F (36.4 C)-98.1 F (36.7 C)] 97.8 F (36.6 C) (06/26 0400) Pulse Rate:  [63-88] 65 (06/26 0400) Resp:  [16-27] 20 (06/26 0400) BP: (131-164)/(75-98) 148/88 mmHg (06/26 0400) SpO2:  [95 %-100 %] 99 % (06/26 0400) Weight:  [80.5 kg (177 lb 7.5 oz)] 80.5 kg (177 lb 7.5 oz) (06/26 0500)  HEMODYNAMICS: CVP:  [10 mmHg-17 mmHg] 12 mmHg  INTAKE /  OUTPUT: Intake/Output     06/25 0701 - 06/26 0700 06/26 0701 - 06/27 0700   I.V. (mL/kg) 416.2 (5.2)    IV Piggyback 112.5    Total Intake(mL/kg) 528.7 (6.6)    Urine (mL/kg/hr) 1545 (0.8)    Total Output 1545     Net -1016.4          Urine Occurrence 3 x      PHYSICAL EXAMINATION:  General: Ill-appearing male in no distress  Neuro: A&Ox3, follows commands HEENT: Dry mucus membranes, no jvd  Cardiovascular: S1S2 rrr, no m/r/g, pulses +1  Lungs: lungs clear bilaterally. No wheezes audible Abdomen: Soft nontender no organomegaly, BS active  Musculoskeletal: MAE, strength +4 Skin: pink, warm, intact  LABS:  Recent Labs Lab 08/16/12 1400 08/17/12 0400 08/18/12 0340  NA 139 141 144  K 4.3 4.1 3.5  CL 105 107 109  CO2 23 24 24   BUN 50* 52* 53*  CREATININE 2.33* 2.10* 1.93*  GLUCOSE 276* 218* 87    Recent Labs Lab 08/16/12 1400 08/17/12 0400 08/18/12 0340  HGB 7.0* 7.3* 7.6*  HCT 22.2* 22.9* 23.4*  WBC 1.3* 1.8* 2.2*  PLT 229 235 235    CXR:   ASSESSMENT / PLAN:  PULMONARY  A:  Acute hypoxic respiratory failure - concern for bilateral atypical opportunistic pneumonia do to neutropenia organism not yet specified. Other differentials include pulmonary edema given ProBNP elevated to 3500. Developed worsening respiratory distress s/p CT on 6/23 requiring MV.  P:  -Wean [name] -Continue IS -Continue nebs PRN -See infection section -f/u cxr in am 6/27  CARDIOVASCULAR  A:  Septic shock - resolved Pulmonary edema - improved although further diuresis limited by elevated Cr. Hx of HTN P:  -Tele monitoring -Resume home amlodipine -Resume home atenolol at reduced dose -hold diovan   RENAL  A:  Stage III chronic renal disease - Cr 3.35 on admission, baseline sr cr 1.6 (06/2012)  P:  -Monitor I&0  -Monitor BMP   GASTROINTESTINAL  A:  STaff note: Choked while eating chicken 08/18/12 P:  - NPO and speech eval - HEMATOLOGIC  A:  Multiple myeloma - IgA  subtype refractory to treatment and not a candidate for bone marrow transplantation  Neutropenia - followed by Dr. Arbutus Ped. Hypothermic on admission  Severe immunosuppression   P:  -SB q Heparin -Monitor CBC -Continue neupogen - PRBC for hgb </= 6.9gm%       INFECTIOUS  A:  Immunosuppressed state  neutropenia - with hypothermia Opportunistic pneumonia - Parainfluenza 3 positive and Pseudomonas UTI - UC positive for Enterobacter Aerogenes Septic shock - resolved, PCT mildly elevated  P:  -Discontinue Zosyn -Start Cipro PO to finish a 14 day course  ENDOCRINE  A:  Diabetes type 2  - hyperglycemia in setting of high dose steroids.  P:  -CBG monitoring -SSI insulin -Continue Glargine at 20 units   NEUROLOGIC  A:  Delirium - in setting of acute critical illness / fever  P:  -Monitor -supportive care  -Increase Oxycontin to 20 mg BID which replaces half of his home dose (Staff note: RN feels him to be groggy today. Pain well controlled with oxycontine 15mg  bid. So, due to renal failure and risk of OIN will erduced oxycontin to 10mg  bid)   TODAY'S SUMMARY:  67 year old African American male with refractory multiple myeloma IgA subtype presenting with neutropenia and concern for opportunistic pneumonia with septic shock. Culture data noted, will narrow antibiotics to Cipro. Keep in SDU [facility] S-ACNP  Canary Brim, NP-C [place] [facility] Pgr: [phone] or [phone]   STAFF NOTE: I, Dr Lavinia Sharps have personally reviewed patient's available data, including medical history, events of note, physical examination and test results as part of my evaluation. I have discussed with resident/NP and other care providers such as pharmacist, RN and RRT.  In addition,  I personally evaluated patient and elicited key findings of see comments staff note.  Rest per NP/medical resident whose note is outlined above and that I agree with    Dr. Kalman Shan, M.D.,  [facility].C.P Pulmonary and Critical Care Medicine Staff Physician [facility] [place] [facility] Pager: [phone], If no answer or between  15:00h - 7:00h: call [phone]  08/18/2012 11:35 AM

## 2012-08-18 NOTE — Progress Notes (Signed)
[facility] ADULT ICU REPLACEMENT PROTOCOL FOR AM LAB REPLACEMENT ONLY  The patient does apply for the [facility] Adult ICU Electrolyte Replacment Protocol based on the criteria listed below:   1. Is GFR >/= 40 ml/min? yes  Patient's GFR today is 41 2. Is urine output >/= 0.5 ml/kg/hr for the last 6 hours? yes Patient's UOP is 1.2 ml/kg/hr 3. Is BUN < 60 mg/dL? yes  Patient's BUN today is 53 4. Abnormal electrolyte(s): K 3.5 5. Ordered repletion with: per protocol 6. If a panic level lab has been reported, has the CCM MD in charge been notified? yes.   Physician:  Achille Rich, Tashyra Adduci A 08/18/2012 4:55 AM

## 2012-08-18 NOTE — Progress Notes (Signed)
SLP Cancellation Note  Patient Details Name: Rodney Green MRN: [id number] DOB: 1945/05/05   Cancelled treatment:       Reason Eval/Treat Not Completed: Fatigue/lethargy limiting ability to participate;Patient not medically ready;Other (comment) (MD suspects silent asp. Pt not approp for BSE/MBS today) Spoke with RN and pt's wife.  Pt heart rate currently highly variable, and pt is quite lethargic.  MD is concerned about silent aspiration, which cannot be identified at bedside.  Pt unable to participate in BSE or MBS at this time due to lethargy and medical status.  ST to recheck next date and proceed as pt tolerates.  RN aware. Rodney Shepard B. [place], [facility], CCC-SLP [phone]  [place]igh [place] 08/18/2012, 2:58 PM

## 2012-08-18 NOTE — Progress Notes (Signed)
ANTIBIOTIC CONSULT NOTE - FOLLOW UP  Pharmacy Consult for Zosyn Indication: Urosepsis, Atypical PNA  Allergies  Allergen Reactions  . Ace Inhibitors     He is not sure about allergy to ace inhibitors    Patient Measurements: Height: 5\' 5"  (165.1 cm) Weight: 177 lb 7.5 oz (80.5 kg) IBW/kg (Calculated) : 61.5  Vital Signs: Temp: 97.8 F (36.6 C) (06/26 0400) Temp src: Oral (06/26 0400) BP: 166/96 mmHg (06/26 0803) Pulse Rate: 74 (06/26 0803) Intake/Output from previous day: 06/25 0701 - 06/26 0700 In: 528.7 [I.V.:416.2; IV Piggyback:112.5] Out: 1545 [Urine:1545] Intake/Output from this shift:    Labs:  Recent Labs  08/16/12 1400 08/17/12 0400 08/18/12 0340  WBC 1.3* 1.8* 2.2*  HGB 7.0* 7.3* 7.6*  PLT 229 235 235  CREATININE 2.33* 2.10* 1.93*   Estimated Creatinine Clearance: 37.8 ml/min (by C-G formula based on Cr of 1.93).   Microbiology: Recent Results (from the past 720 hour(s))  CULTURE, BLOOD (ROUTINE X 2)     Status: None   Collection Time    08/14/12  8:45 AM      Result Value Range Status   Specimen Description BLOOD LEFT HAND   Final   Special Requests BOTTLES DRAWN AEROBIC AND ANAEROBIC 4CC EACH   Final   Culture  Setup Time 08/14/2012 17:47   Final   Culture     Final   Value:        BLOOD CULTURE RECEIVED NO GROWTH TO DATE CULTURE WILL BE HELD FOR 5 DAYS BEFORE ISSUING A FINAL NEGATIVE REPORT   Report Status PENDING   Incomplete  CULTURE, BLOOD (ROUTINE X 2)     Status: None   Collection Time    08/14/12  9:00 AM      Result Value Range Status   Specimen Description BLOOD RIGHT HAND   Final   Special Requests BOTTLES DRAWN AEROBIC AND ANAEROBIC 5CC EACH   Final   Culture  Setup Time 08/14/2012 17:47   Final   Culture     Final   Value:        BLOOD CULTURE RECEIVED NO GROWTH TO DATE CULTURE WILL BE HELD FOR 5 DAYS BEFORE ISSUING A FINAL NEGATIVE REPORT   Report Status PENDING   Incomplete  CULTURE, EXPECTORATED SPUTUM-ASSESSMENT     Status:  None   Collection Time    08/14/12  9:42 AM      Result Value Range Status   Specimen Description SPUTUM   Final   Special Requests Immunocompromised   Final   Sputum evaluation     Final   Value: THIS SPECIMEN IS ACCEPTABLE. RESPIRATORY CULTURE REPORT TO FOLLOW.   Report Status 08/14/2012 FINAL   Final  CULTURE, RESPIRATORY (NON-EXPECTORATED)     Status: None   Collection Time    08/14/12  9:42 AM      Result Value Range Status   Specimen Description SPUTUM   Final   Special Requests NONE   Final   Gram Stain     Final   Value: NO WBC SEEN     RARE SQUAMOUS EPITHELIAL CELLS PRESENT     FEW GRAM POSITIVE COCCI IN PAIRS     FEW GRAM POSITIVE RODS   Culture NORMAL OROPHARYNGEAL FLORA   Final   Report Status 08/16/2012 FINAL   Final  URINE CULTURE     Status: None   Collection Time    08/14/12 10:17 AM      Result Value Range Status  Specimen Description URINE, CATHETERIZED   Final   Special Requests NONE   Final   Culture  Setup Time 08/14/2012 15:22   Final   Colony Count 3,000 COLONIES/ML   Final   Culture ENTEROBACTER AEROGENES   Final   Report Status 08/16/2012 FINAL   Final   Organism ID, Bacteria ENTEROBACTER AEROGENES   Final  MRSA PCR SCREENING     Status: None   Collection Time    08/14/12 11:55 AM      Result Value Range Status   MRSA by PCR NEGATIVE  NEGATIVE Final   Comment:            The GeneXpert MRSA Assay (FDA     approved for NASAL specimens     only), is one component of a     comprehensive MRSA colonization     surveillance program. It is not     intended to diagnose MRSA     infection nor to guide or     monitor treatment for     MRSA infections.  RESPIRATORY VIRUS PANEL     Status: Abnormal   Collection Time    08/14/12  2:13 PM      Result Value Range Status   Source - RVPAN NASAL SWAB   Corrected   Comment: CORRECTED ON 06/23 AT 1913: PREVIOUSLY REPORTED AS NASAL SWAB   Respiratory Syncytial Virus A NOT DETECTED   Final   Respiratory  Syncytial Virus B NOT DETECTED   Final   Influenza A NOT DETECTED   Final   Influenza B NOT DETECTED   Final   Parainfluenza 1 NOT DETECTED   Final   Parainfluenza 2 NOT DETECTED   Final   Parainfluenza 3 DETECTED (*)  Final   Metapneumovirus NOT DETECTED   Final   Rhinovirus NOT DETECTED   Final   Adenovirus NOT DETECTED   Final   Influenza A H1 NOT DETECTED   Final   Influenza A H3 NOT DETECTED   Final   Comment: (NOTE)           Normal Reference Range for each Analyte: NOT DETECTED     Testing performed using the Luminex xTAG Respiratory Viral Panel test     kit.     This test was developed and its performance characteristics determined     by [employer]. It has not been cleared or approved by the [place]     Food and Drug Administration. This test is used for clinical purposes.     It should not be regarded as investigational or for research. This     laboratory is certified under the Clinical Laboratory Improvement     Amendments of 1988 (CLIA) as qualified to perform high complexity     clinical laboratory testing.  AFB CULTURE WITH SMEAR     Status: None   Collection Time    08/15/12  6:24 PM      Result Value Range Status   Specimen Description BRONCHIAL ALVEOLAR LAVAGE   Final   Special Requests Immunocompromised   Final   ACID FAST SMEAR NO ACID FAST BACILLI SEEN   Final   Culture     Final   Value: CULTURE WILL BE EXAMINED FOR 6 WEEKS BEFORE ISSUING A FINAL REPORT   Report Status PENDING   Incomplete  CULTURE, BAL-QUANTITATIVE     Status: None   Collection Time    08/15/12  6:24 PM  Result Value Range Status   Specimen Description BRONCHIAL ALVEOLAR LAVAGE   Final   Special Requests Immunocompromised   Final   Gram Stain     Final   Value: MODERATE WBC PRESENT,BOTH PMN AND MONONUCLEAR     NO SQUAMOUS EPITHELIAL CELLS SEEN     NO ORGANISMS SEEN   Colony Count 300 COLONIES/ML   Final   Culture PSEUDOMONAS AERUGINOSA   Final   Report Status 08/18/2012  FINAL   Final   Organism ID, Bacteria PSEUDOMONAS AERUGINOSA   Final  FUNGUS CULTURE W SMEAR     Status: None   Collection Time    08/15/12  6:24 PM      Result Value Range Status   Specimen Description BRONCHIAL ALVEOLAR LAVAGE   Final   Special Requests Immunocompromised   Final   Fungal Smear NO YEAST OR FUNGAL ELEMENTS SEEN   Final   Culture CULTURE IN PROGRESS FOR FOUR WEEKS   Final   Report Status PENDING   Incomplete  LEGIONELLA CULTURE     Status: None   Collection Time    08/15/12  6:24 PM      Result Value Range Status   Specimen Description BRONCHIAL ALVEOLAR LAVAGE   Final   Special Requests Immunocompromised   Final   Culture     Final   Value: NO LEGIONELLA ISOLATED, CULTURE IN PROGRESS FOR 5 DAYS   Report Status PENDING   Incomplete  PNEUMOCYSTIS JIROVECI SMEAR BY DFA     Status: None   Collection Time    08/15/12  6:24 PM      Result Value Range Status   Specimen Source-[facility] BRONCHIAL ALVEOLAR LAVAGE   Final   Pneumocystis jiroveci Ag NEGATIVE   Final   Comment: Performed at [facility]  RESPIRATORY VIRUS PANEL     Status: Abnormal   Collection Time    08/15/12  6:24 PM      Result Value Range Status   Source - RVPAN BRONCHIAL ALVEOLAR LAVAGE   Corrected   Comment: CORRECTED ON 06/24 AT 1801: PREVIOUSLY REPORTED AS BRONCHIAL ALVEOLAR LAVAGE   Respiratory Syncytial Virus A NOT DETECTED   Final   Respiratory Syncytial Virus B NOT DETECTED   Final   Influenza A NOT DETECTED   Final   Influenza B NOT DETECTED   Final   Parainfluenza 1 NOT DETECTED   Final   Parainfluenza 2 NOT DETECTED   Final   Parainfluenza 3 DETECTED (*)  Final   Comment: RESULT CALLED TO, READ BACK BY AND VERIFIED WITH:     Hughes Better RN [id number] 08/17/12 A NAVARRO   Metapneumovirus NOT DETECTED   Final   Rhinovirus NOT DETECTED   Final   Adenovirus NOT DETECTED   Final   Influenza A H1 NOT DETECTED   Final   Influenza A H3 NOT DETECTED   Final   Comment: (NOTE)            Normal Reference Range for each Analyte: NOT DETECTED     Testing performed using the Luminex xTAG Respiratory Viral Panel test     kit.     This test was developed and its performance characteristics determined     by [employer]. It has not been cleared or approved by the [place]     Food and Drug Administration. This test is used for clinical purposes.     It should not be regarded as investigational or  for research. This     laboratory is certified under the Clinical Laboratory Improvement     Amendments of 1988 (CLIA) as qualified to perform high complexity     clinical laboratory testing.    Anti-infectives   Start     Dose/Rate Route Frequency Ordered Stop   08/14/12 1200  piperacillin-tazobactam (ZOSYN) IVPB 3.375 g     3.375 g 12.5 mL/hr over 240 Minutes Intravenous 3 times per day 08/14/12 1058     08/14/12 1200  vancomycin (VANCOCIN) IVPB 1000 mg/200 mL premix  Status:  Discontinued     1,000 mg 200 mL/hr over 60 Minutes Intravenous Every 24 hours 08/14/12 1058 08/16/12 1146   08/14/12 1045  azithromycin (ZITHROMAX) 500 mg in dextrose 5 % 250 mL IVPB  Status:  Discontinued     500 mg 250 mL/hr over 60 Minutes Intravenous Every 24 hours 08/14/12 1043 08/16/12 1146      Assessment: 66yo M with multiple myeloma admitted with neutropenia and bilateral atypical pneumonia and sepsis.  Day #5 Zosyn (Vanc and Zithro dc'd 6/24)  SCr slowly improving to 1.9 today, CrCl ~38 ml/min  WBC improving on Neupogen, 2.2 today  Cultures as above  AFebrile   Goal of Therapy:  Eradication of infection Dose per renal function  Plan:   Continue Zosyn 3.375gm IV q8h (4hr extended infusions)  Consider narrowing to Cirpo since both Enterobacter and Pseudomonas are sensitive  Loralee Pacas, PharmD, BCPS Pager: [phone] 08/18/2012,8:54 AM

## 2012-08-18 NOTE — Progress Notes (Signed)
NUTRITION FOLLOW UP  Intervention:   - Diet advancement per MD/SLP - Will continue to monitor   Nutrition Dx:   Inadequate oral intake related to inability to eat as evidenced by NPO, ventilated - no longer appropriate, pt extubated.   New nutrition dx: Inadequate oral intake related to inability to eat as evidenced by NPO, awaiting swallow evaluation.   Goal:  Enteral nutrition to provide 60-70% of estimated calorie needs (22-25 kcals/kg ideal body weight) and 100% of estimated protein needs, based on ASPEN guidelines for permissive underfeeding in critically ill obese individuals - no longer appropriate, pt not on TF  New goal: Advance diet as tolerated to regular diet - texture/consistency per SLP   Monitor:   Weights, labs, diet advancement  Assessment:   Pt discussed during multidisciplinary ronds. Noted events from earlier this week with OGT unable to be placed and TF not initiated and then pt extubated yesterday. Pt started on clear liquid diet yesterday and wife reports he did well with this diet, no nausea. MD and wife reported pt with choking episode this morning. Wife reports pt was eating chicken broth and then had to cough to get it out. MD ordered SLP evaluation. Wife reports PTA pt had a few  episodes of coughing after eating r/t trying to talk and eat at the same time but he was able to cough whatever he was eating up and it was not a problem. Wife states pt had an MBS years ago but it was normal.   Height: Ht Readings from Last 1 Encounters:  08/14/12 5\' 5"  (1.651 m)    Weight Status:   Wt Readings from Last 1 Encounters:  08/18/12 177 lb 7.5 oz (80.5 kg)    Re-estimated needs:  Kcal: 1550-1850 Protein: 60-75g Fluid: 1.5-1.8L/day  Skin: +1 RLE, LLE edema  Diet Order: NPO   Intake/Output Summary (Last 24 hours) at 08/18/12 1231 Last data filed at 08/18/12 0900  Gross per 24 hour  Intake  477.5 ml  Output   1445 ml  Net -967.5 ml    Last BM:  6/23   Labs:   Recent Labs Lab 08/15/12 0359 08/15/12 1915  08/16/12 1400 08/17/12 0400 08/18/12 0340  NA 138 134*  < > 139 141 144  K 4.4 4.6  < > 4.3 4.1 3.5  CL 107 100  < > 105 107 109  CO2 19 23  < > 23 24 24   BUN 53* 48*  < > 50* 52* 53*  CREATININE 2.41* 2.52*  < > 2.33* 2.10* 1.93*  CALCIUM 8.6 8.6  < > 8.4 8.7 8.8  MG  --  1.8  --  2.0 2.2  --   PHOS  --  4.1  --   --   --   --   GLUCOSE 93 188*  < > 276* 218* 87  < > = values in this interval not displayed.  CBG (last 3)   Recent Labs  08/18/12 0748 08/18/12 0833 08/18/12 1127  GLUCAP 53* 79 218*    Scheduled Meds: . amLODipine  5 mg Oral Daily  . antiseptic oral rinse  15 mL Mouth Rinse q12n4p  . aspirin  81 mg Oral Daily  . atenolol  50 mg Oral BID  . ciprofloxacin  500 mg Oral BID  . filgrastim  480 mcg Subcutaneous Daily  . heparin  5,000 Units Subcutaneous Q8H  . insulin aspart  0-20 Units Subcutaneous Q4H  . insulin glargine  20 Units  Subcutaneous Daily  . LORazepam  0.5 mg Intravenous Once  . [START ON 08/19/2012] methylPREDNISolone (SOLU-MEDROL) injection  60 mg Intravenous Q24H  . OxyCODONE  10 mg Oral Q12H  . pantoprazole (PROTONIX) IV  40 mg Intravenous Q24H  . potassium chloride  20 mEq Oral Q4H  . sodium chloride  3 mL Intravenous Q8H  . Travoprost (BAK Free)  1 drop Both Eyes QHS    Continuous Infusions: . sodium chloride 500 mL (08/16/12 0541)     Levon Hedger MS, RD, LDN [phone] Pager [phone] After Hours Pager

## 2012-08-18 NOTE — Progress Notes (Signed)
Notified Elink MD of pt's change from NSR to sinus bradycardia, HR in the high 40s to low 50s, dropping as low as 44bpm. BP is stable. Pt asymptomatic. This change to bradycardia is occuring post administration of restarted doses of pt's atenolol and norvasc. MD aware and instructed to continue to monitor. Will continue to monitor.  Langley Gauss, RN

## 2012-08-19 LAB — CBC
MCH: 28.4 pg (ref 26.0–34.0)
MCHC: 31.3 g/dL (ref 30.0–36.0)
MCV: 90.7 fL (ref 78.0–100.0)
Platelets: 236 10*3/uL (ref 150–400)
RBC: 2.68 MIL/uL — ABNORMAL LOW (ref 4.22–5.81)

## 2012-08-19 LAB — GLUCOSE, CAPILLARY
Glucose-Capillary: 103 mg/dL — ABNORMAL HIGH (ref 70–99)
Glucose-Capillary: 91 mg/dL (ref 70–99)

## 2012-08-19 LAB — BASIC METABOLIC PANEL
CO2: 27 mEq/L (ref 19–32)
Calcium: 9 mg/dL (ref 8.4–10.5)
Creatinine, Ser: 1.65 mg/dL — ABNORMAL HIGH (ref 0.50–1.35)
GFR calc non Af Amer: 42 mL/min — ABNORMAL LOW (ref >90)
Glucose, Bld: 110 mg/dL — ABNORMAL HIGH (ref 70–99)
Sodium: 145 mEq/L (ref 135–145)

## 2012-08-19 LAB — PHOSPHORUS: Phosphorus: 1.8 mg/dL — ABNORMAL LOW (ref 2.3–4.6)

## 2012-08-19 LAB — MAGNESIUM: Magnesium: 2.1 mg/dL (ref 1.5–2.5)

## 2012-08-19 MED ORDER — ATENOLOL 25 MG PO TABS
25.0000 mg | ORAL_TABLET | Freq: Two times a day (BID) | ORAL | Status: DC
  Filled 2012-08-19 (×3): qty 1

## 2012-08-19 MED ORDER — ATENOLOL 50 MG PO TABS
50.0000 mg | ORAL_TABLET | Freq: Two times a day (BID) | ORAL | Status: DC
  Administered 2012-08-19: 50 mg via ORAL
  Filled 2012-08-19 (×2): qty 1

## 2012-08-19 MED ORDER — INSULIN ASPART 100 UNIT/ML ~~LOC~~ SOLN
0.0000 [IU] | Freq: Three times a day (TID) | SUBCUTANEOUS | Status: DC
  Administered 2012-08-20 (×2): 3 [IU] via SUBCUTANEOUS
  Administered 2012-08-20: 7 [IU] via SUBCUTANEOUS
  Administered 2012-08-21: 3 [IU] via SUBCUTANEOUS
  Administered 2012-08-21 (×2): 4 [IU] via SUBCUTANEOUS
  Administered 2012-08-21 – 2012-08-22 (×3): 3 [IU] via SUBCUTANEOUS

## 2012-08-19 MED ORDER — POTASSIUM PHOSPHATE DIBASIC 3 MMOLE/ML IV SOLN
30.0000 mmol | Freq: Once | INTRAVENOUS | Status: AC
  Administered 2012-08-19: 30 mmol via INTRAVENOUS
  Filled 2012-08-19 (×2): qty 10

## 2012-08-19 MED ORDER — SODIUM PHOSPHATE 3 MMOLE/ML IV SOLN
30.0000 mmol | Freq: Once | INTRAVENOUS | Status: DC
  Filled 2012-08-19: qty 10

## 2012-08-19 NOTE — Progress Notes (Addendum)
PULMONARY  / CRITICAL CARE MEDICINE  Name: Rodney Green MRN: [id number] DOB: 01/09/1946    ADMISSION DATE:  08/14/2012 CONSULTATION DATE: 08/15/2012   REFERRING MD : EDP  PRIMARY SERVICE: PCCM   CHIEF COMPLAINT: Hypotension and fever   BRIEF PATIENT DESCRIPTION: This a 67 year old African American male with refractory multiple myeloma IgA subtype (see 6/23 PCCM note for complete list or prior treatments) admitted with neutropenia and bilateral atypical pneumonia with hypoxemia and septic shock. Critical care was asked to admit   SIGNIFICANT EVENTS / STUDIES:  6/23 - requiring intermittent bipap for resp support, fever  6/23 - CT of Chest - no lobar consolidation, pulmonary artery enlargement, osseous findings most consistent with the clinical history of myeloma 6/23 - ECHO - Ef 60-65%, PA peak pressures 31 6/23 - PM Developed worsening resp distress during CT scan requiring intubation and CVC placement 6/25 - Extubated 6/26 - episode of choking on chicken, concern for aspiration / swallowing difficulties   LINES / TUBES:  PIV  6/23 Foley>> 6/26 6/23 CVC L IJ>>>  6/23 ETT>>>6/25  CULTURES:  6/22 BCx2>>>  6/22 UC>>> Enterobacter Aerogenes 6/22 Resp Vira from sputml>>> Parainfluenza 3 6/22 Sputum>>>normal flora ........... 6/23 Bronch BAL>>> Pseudomonas from BAL and PIV 3 6/23 Urine Step Pneumo >> Negative 6/23 Urin Legionella >> negative ............... 6/23 BRONCH RML BAL (Dr. Marchelle Gearing) - Cell count, PCR virus, AFB, Fungus   ANTIBIOTICS:  Ciprofloxacin 6/26>>> Zosyn 6/22>>> 6/26 Vancomycin 6/22>>> 6/24 Azithromycin 6/22>>> 6/24  SUBJECTIVE:   Feels better, breathing is good.     Staff comments: Denies pain . RN reported bradycardia but normal BP pvernight with home bp meds.   Passed swallow eval (choking on chicken yesterday)    VITAL SIGNS: Temp:  [97.5 F (36.4 C)-98.6 F (37 C)] 97.5 F (36.4 C) (06/27 0400) Pulse Rate:  [47-61] 61 (06/27  0400) Resp:  [12-20] 20 (06/27 0400) BP: (144-174)/(75-102) 144/102 mmHg (06/27 0400) SpO2:  [94 %-100 %] 98 % (06/27 0400) Weight:  [79.8 kg (175 lb 14.8 oz)] 79.8 kg (175 lb 14.8 oz) (06/27 0400)  INTAKE / OUTPUT: Intake/Output     06/26 0701 - 06/27 0700 06/27 0701 - 06/28 0700   I.V. (mL/kg) 740 (9.3)    IV Piggyback 37.5    Total Intake(mL/kg) 777.5 (9.7)    Urine (mL/kg/hr) 1400 (0.7)    Total Output 1400     Net -622.5          Stool Occurrence 1 x      PHYSICAL EXAMINATION:  General: Chronically-ill appearing male in no distress . Looking much much beter. Sitting in chair Neuro: A&Ox3, follows commands, Denies pain HEENT: Dry mucus membranes, no jvd  Cardiovascular: S1S2 rrr, no m/r/g, pulses +1  Lungs: lungs clear bilaterally. No wheezes audible Abdomen: Soft nontender no organomegaly, BS active  Musculoskeletal: MAE, strength +4 Skin: pink, warm, intact  LABS:  Recent Labs Lab 08/17/12 0400 08/18/12 0340 08/19/12 0430  NA 141 144 145  K 4.1 3.5 3.9  CL 107 109 112  CO2 24 24 27   BUN 52* 53* 47*  CREATININE 2.10* 1.93* 1.65*  GLUCOSE 218* 87 110*    Recent Labs Lab 08/17/12 0400 08/18/12 0340 08/19/12 0430  HGB 7.3* 7.6* 7.6*  HCT 22.9* 23.4* 24.3*  WBC 1.8* 2.2* 4.0  PLT 235 235 236    CXR:   ASSESSMENT / PLAN:  PULMONARY  A:  Acute hypoxic respiratory failure-now resolved Pseudomonas PNA Parainfluenza P:  -Continue IS -  Continue nebs PRN -Discontinue steroids -see ID  CARDIOVASCULAR  A:  Septic shock - resolved Pulmonary edema - now resolved Hx of HTN P:  -hold diovan due to creatinine -Will hold off on increasing atenolol to home dose due to bradycardia overnight -hold parameters added - HR<50, SBP <90   RENAL  A:  Stage III chronic renal disease - Cr 3.35 on admission, baseline sr cr 1.6 (06/2012) . Currently improving but has low phos P:  -Monitor I&0  -Monitor BMP -Replete phosphorus   GASTROINTESTINAL  A:   Malnutrition due to extended NPO period Concern for dysphagia following chocking episode 6/26 P:  - NPO and speech eval  HEMATOLOGIC  A:  Multiple myeloma - IgA subtype refractory to treatment and not a candidate for bone marrow transplantation  Neutropenia - followed by Dr. Arbutus Ped. Hypothermic on admission  Severe immunosuppression   P:  -SB q Heparin -Monitor CBC -Continue neupogen -PRBC for hgb </= 6.9gm%    INFECTIOUS  A:  Immunosuppressed state  neutropenia - with hypothermia Opportunistic pneumonia - Parainfluenza 3 positive and Pseudomonas UTI - UC positive for Enterobacter Aerogenes Septic shock - resolved, PCT mildly elevated  P:  -Cipro PO to finish a 14 day course  ENDOCRINE  A:  Diabetes type 2  - hyperglycemia in setting of high dose steroids.  P:  -CBG monitoring -SSI insulin -Continue Glargine at 20 units, discussed holding with RN if remains NPO   NEUROLOGIC  A:  Delirium- now resolved. Denies pain P:  -Monitor -supportive care  -Continue oxycontin (staff note: erduce home oxuycontin 40mg  bid to 10mg  bid due to good pain control and renal insuf)    TODAY'S SUMMARY:   67 year old African American male with refractory multiple myeloma IgA subtype presenting with neutropenia and concern for opportunistic pneumonia with septic shock. His creatinine has returned to his baseline. Had some bradycardia overnight, was asymptomatic, will hold off on increasing his atenolol back to his home dose. Plan for swallow evaluation with SLP and if he passes the study can resume diet per their recommendations. Will hold on increasing his Glargine to his home dose until he is able to consume a normal diet.  Keep in SDU 08/19/12 Anticipate home 08/22/12.  Updated wife at [facility] S-ACNP    Canary Brim, NP-C [facility] Pgr: [phone] or [phone]     Dr. Kalman Shan, M.D., F.C.C.P Pulmonary and Critical Care  Medicine Staff Physician [facility] Pager: [phone], If no answer or between  15:00h - 7:00h: call [phone]  08/19/2012 11:45 AM

## 2012-08-19 NOTE — Evaluation (Signed)
Clinical/Bedside Swallow Evaluation Patient Details  Name: Rodney Green MRN: [id number] Date of Birth: [birth year]  Today's Date: 08/19/2012 Time: 0865-7846 SLP Time Calculation (min): 44 min  Past Medical History:  Past Medical History  Diagnosis Date  . Allergy   . Diabetes mellitus   . Hypertension   . Hepatitis C   . Multiple myeloma   . Multiple myeloma   . Chronic kidney disease   . Shortness of breath    Past Surgical History:  Past Surgical History  Procedure Laterality Date  . Circumcision    . Hand surgery    . Abdominal abcess     HPI:  67 yo male adm to [facility] with refractory multiple myeloma, atypical bilateral pneumonia and neurtropenia.  Pt required mechanical ventilation 6/23-6/25/14.  PMH also + for mild esophageal dysmotility, possible distal stricture at GE in 07/2010.  Pt is s/p EGD 09/2010 with findings of food found in stomach - ? gastroparesis with dilatation of distal narrowing completed.  Pt and spouse report improved swallow ability since EGD.  He does frequently belch but overtly denies reflux symptoms or stasis.  Spouse reports weight loss from 212 to 180's since multiple myeloma diagnosis in 2009.  Pt was  "choking on chicken broth" yesterday and swallow evaluation was ordered.    Assessment / Plan / Recommendation Clinical Impression  Pt presents with functional oropharyngeal swallow ability based on clinical swallow assessment.  He does require verbal cues to cease talking with food in his mouth, which spouse states is baseline.    Pt familiar to this SLP from MBS completed in 2009 with mild difficulties diagnosed.  Per spouse, pt was sleepy when eating his broth yesterday and she contributes that to his coughing.  Spouse also states pt will fall asleep during meals at home - suspect behavior/respiratory factors pertaining to aspiration risk.  Also spouse reports pt takes pain medication, ? If that could impact airway protection/secretion management.    Pt denies any symptoms of reflux or esophageal dysmotiltiy - he did have mild esoph dysmotility diagnosed in 2012 per esophagram.  Also pt with food retained in stomach during EGD 09/2010 and GI Md questioned gastroparesis.  Rec pt continue regular diet with general aspiration/esophageal precautions to mitigate aspiration risk.  Spouse present and both pt and spouse educated to recommended precautions, previous testing finding.    Advised pt to monitor his swallow ability closely and contact his GI md if note worsening issues.  Spouse expressed gratitude for information.  SLP to sign off, please reconsult if desire.      Aspiration Risk  Mild    Diet Recommendation Regular;Thin liquid   Liquid Administration via:  (avoid straw if incr belching, (spouse states does not)) Medication Administration:  (as tolerated) Supervision: Patient able to self feed Compensations: Slow rate;Small sips/bites Postural Changes and/or Swallow Maneuvers: Seated upright 90 degrees;Upright 30-60 min after meal    Other  Recommendations Oral Care Recommendations: Oral care QID Other Recommendations: Clarify dietary restrictions   Follow Up Recommendations     n/a  Frequency and Duration   n/a     Pertinent Vitals/Pain Afebrile, decreased    SLP Swallow Goals  n/a   Swallow Study Prior Functional Status   feeds self at home- denies any dysphagia symptoms    General Date of Onset: 08/19/12 HPI: 67 yo male adm to [facility] with refractory multiple myeloma, atypical bilateral pneumonia and neurtropenia.  Pt required mechanical ventilation 6/23-6/25/14.  PMH also + for  mild esophageal dysmotility, possible distal stricture at GE in 07/2010.  Pt is s/p EGD 09/2010 with findings of food found in stomach - ? gastroparesis with dilatation of distal narrowing completed.  Pt and spouse report improved swallow ability since EGD.  He does frequently belch but overtly denies reflux symptoms or stasis.  Spouse reports weight  loss from 212 to 180's since multiple myeloma diagnosis in 2009.  Pt was  "choking on chicken broth" yesterday and swallow evaluation was ordered.  Type of Study: Bedside swallow evaluation Previous Swallow Assessment: MBS 2009, esophagram 2012, EGD 2012 Diet Prior to this Study: NPO Temperature Spikes Noted: No Respiratory Status: Room air History of Recent Intubation: Yes Length of Intubations (days): 3 days Date extubated: 08/17/12 Behavior/Cognition: Alert;Cooperative;Pleasant mood Oral Cavity - Dentition: Poor condition;Adequate natural dentition Self-Feeding Abilities: Able to feed self Patient Positioning: Upright in chair Baseline Vocal Quality: Clear Volitional Cough: Strong Volitional Swallow: Able to elicit    Oral/Motor/Sensory Function Overall Oral Motor/Sensory Function: Appears within functional limits for tasks assessed   Ice Chips Ice chips: Within functional limits Presentation: Self Fed   Thin Liquid Thin Liquid: Impaired Presentation: Cup;Straw;Self Fed Pharyngeal  Phase Impairments: Suspected delayed Swallow    Nectar Thick Nectar Thick Liquid: Not tested   Honey Thick Honey Thick Liquid: Not tested   Puree Puree: Within functional limits Presentation: Self Fed;Spoon   Solid   GO    Solid: Within functional limits Presentation: Self Fed Other Comments: pt tends to talk with food in his mouth, required verbal cues to cease talking and clear       Chales Abrahams 08/19/2012,12:05 PM

## 2012-08-19 NOTE — Progress Notes (Signed)
Inpatient Diabetes Program Recommendations  AACE/ADA: New Consensus Statement on Inpatient Glycemic Control (2013)  Target Ranges:  Prepandial:   less than 140 mg/dL      Peak postprandial:   less than 180 mg/dL (1-2 hours)      Critically ill patients:  140 - 180 mg/dL   Results for GERRICK, RAY (MRN [id number]) as of 08/19/2012 09:52  Ref. Range 08/18/2012 07:48 08/18/2012 08:33 08/18/2012 10:01 08/18/2012 11:27 08/18/2012 15:49 08/18/2012 18:03 08/18/2012 19:41 08/19/2012 00:32 08/19/2012 03:59 08/19/2012 04:30 08/19/2012 07:48  Glucose-Capillary Latest Range: 70-99 mg/dL 53 (L) 79  147 (H) 829 (H) 141 (H) 101 (H) 87 103 (H)  145 (H)  Sodium Latest Range: 136-145 mEq/L          145   Potassium Latest Range: 3.5-5.1 mEq/L          3.9     Hypoglycemia on 6/26 am. May benefit from decreasing Lantus to 15 units QAM while NPO to avoid hypoglycemia.  Will continue to follow.  Thank you. Ailene Ards, RD, LDN, CDE Inpatient Diabetes Coordinator [phone]

## 2012-08-19 NOTE — Progress Notes (Signed)
Patient talked about his desire to have something to eat and drink. He is hopeful that the test he needs will happen soon and after that test, that he will be able to go back home. He misses his grandson a lot. Presence, prayer, listening, encouragement. Spouse came in as I was leaving.

## 2012-08-19 NOTE — Clinical Social Work Note (Signed)
CSW has been following for support. Pt progressing and was at home prior to admission. CSW signing off currently as need for CSW to facilitate communication with son in [place] guard has ended . Please re-consult if Pt needs SNF or has other psychosocial concerns.   Doreen Salvage, LCSW ICU/Stepdown Clinical Social Worker [facility] Cell [phone] Hours 8am-1200pm M-F

## 2012-08-20 DIAGNOSIS — C9002 Multiple myeloma in relapse: Secondary | ICD-10-CM

## 2012-08-20 LAB — PHOSPHORUS: Phosphorus: 2.9 mg/dL (ref 2.3–4.6)

## 2012-08-20 LAB — CULTURE, BLOOD (ROUTINE X 2): Culture: NO GROWTH

## 2012-08-20 LAB — CBC
HCT: 25.4 % — ABNORMAL LOW (ref 39.0–52.0)
Hemoglobin: 8.1 g/dL — ABNORMAL LOW (ref 13.0–17.0)
MCH: 28.1 pg (ref 26.0–34.0)
RBC: 2.88 MIL/uL — ABNORMAL LOW (ref 4.22–5.81)

## 2012-08-20 LAB — BASIC METABOLIC PANEL
CO2: 25 mEq/L (ref 19–32)
Chloride: 107 mEq/L (ref 96–112)
Creatinine, Ser: 1.52 mg/dL — ABNORMAL HIGH (ref 0.50–1.35)
Glucose, Bld: 56 mg/dL — ABNORMAL LOW (ref 70–99)

## 2012-08-20 LAB — GLUCOSE, CAPILLARY
Glucose-Capillary: 336 mg/dL — ABNORMAL HIGH (ref 70–99)
Glucose-Capillary: 70 mg/dL (ref 70–99)
Glucose-Capillary: 125 mg/dL — ABNORMAL HIGH (ref 70–99)

## 2012-08-20 LAB — MAGNESIUM: Magnesium: 1.8 mg/dL (ref 1.5–2.5)

## 2012-08-20 MED ORDER — POTASSIUM CHLORIDE CRYS ER 20 MEQ PO TBCR
40.0000 meq | EXTENDED_RELEASE_TABLET | Freq: Three times a day (TID) | ORAL | Status: AC
  Administered 2012-08-20 (×2): 40 meq via ORAL
  Filled 2012-08-20 (×2): qty 2

## 2012-08-20 MED ORDER — AMLODIPINE BESYLATE 10 MG PO TABS
10.0000 mg | ORAL_TABLET | Freq: Once | ORAL | Status: AC
  Administered 2012-08-20: 10 mg via ORAL
  Filled 2012-08-20: qty 1

## 2012-08-20 MED ORDER — ATENOLOL 12.5 MG HALF TABLET
12.5000 mg | ORAL_TABLET | Freq: Two times a day (BID) | ORAL | Status: DC
  Administered 2012-08-20: 10:00:00 via ORAL
  Administered 2012-08-20 – 2012-08-22 (×4): 12.5 mg via ORAL
  Filled 2012-08-20 (×6): qty 1

## 2012-08-20 MED ORDER — AMLODIPINE BESYLATE 10 MG PO TABS
10.0000 mg | ORAL_TABLET | Freq: Every day | ORAL | Status: DC
  Administered 2012-08-20 – 2012-08-22 (×3): 10 mg via ORAL
  Filled 2012-08-20 (×3): qty 1

## 2012-08-20 MED ORDER — PANTOPRAZOLE SODIUM 40 MG PO TBEC
40.0000 mg | DELAYED_RELEASE_TABLET | Freq: Every day | ORAL | Status: DC
  Administered 2012-08-21 – 2012-08-22 (×2): 40 mg via ORAL
  Filled 2012-08-20 (×2): qty 1

## 2012-08-20 MED ORDER — HYDRALAZINE HCL 20 MG/ML IJ SOLN
5.0000 mg | INTRAMUSCULAR | Status: DC | PRN
  Administered 2012-08-20: 10 mg via INTRAVENOUS
  Filled 2012-08-20: qty 1

## 2012-08-20 NOTE — Progress Notes (Signed)
Pt with L IJ CVC intact.  Assessed for a PIV and there was not any PIV to stick in either arm.  Notified Dr. Molli Knock of inability to get PIV for CVC to be removed.  L CVC remains for the moment.  Joanmarie Tsang Debroah Loop RN

## 2012-08-20 NOTE — Progress Notes (Signed)
.  This patient is receiving Protonix. Based on criteria approved by the Pharmacy and Therapeutics Committee, this medication is being converted to the equivalent oral dose form. These criteria include:   . The patient is eating (either orally or per tube) and/or has been taking other orally administered medications for at least 24 hours.  . This patient has no evidence of active gastrointestinal bleeding or impaired GI absorption (gastrectomy, short bowel, patient on TNA or NPO).   If you have questions about this conversion, please contact the pharmacy department. [phone]  Annia Belt, [facility] 08/20/2012 2:04 PM

## 2012-08-20 NOTE — Evaluation (Signed)
Physical Therapy Evaluation Patient Details Name: Rodney Green MRN: [id number] DOB: 1946/01/15 Today's Date: 08/20/2012 Time: 4098-1191 PT Time Calculation (min): 21 min  PT Assessment / Plan / Recommendation History of Present Illness     Clinical Impression  Pt is a 67 year old African American male with refractory multiple myeloma IgA subtype admitted with neutropenia and bilateral atypical pneumonia with hypoxemia and septic shock.  Pt would benefit from acute PT in order to improve safety with mobility and return to PLOF upon d/c home with spouse.  Pt with decrease in SaO2 during ambulation (see mobility section).       PT Assessment  Patient needs continued PT services    Follow Up Recommendations  No PT follow up    Does the patient have the potential to tolerate intense rehabilitation      Barriers to Discharge        Equipment Recommendations  Rolling walker with 5" wheels (depending on progress)    Recommendations for Other Services     Frequency Min 3X/week    Precautions / Restrictions Precautions Precautions: Fall Restrictions Weight Bearing Restrictions: No   Pertinent Vitals/Pain RN reports better BP this morning and okay with PT eval.      Mobility  Bed Mobility Bed Mobility: Supine to Sit Supine to Sit: 6: Modified independent (Device/Increase time) Transfers Transfers: Sit to Stand;Stand to Sit Sit to Stand: 4: Min guard;From bed;From toilet Stand to Sit: 4: Min guard;To toilet;To chair/3-in-1 Details for Transfer Assistance: verbal cues for safe technique, min/guard due to "sea legs" Ambulation/Gait Ambulation/Gait Assistance: 4: Min guard Ambulation Distance (Feet): 200 Feet Assistive device: Rolling walker Ambulation/Gait Assistance Details: pt reaching for objects around room during ambulation to bathroom so encouraged RW to steady and for safety, pt agreeable as he said he hasn't walked since Tues. Gait velocity: decreased General Gait  Details: SaO2 dropped to 80% however pt gripping RW and quickly raised back to 90% however upon return to room SaO2 86% however quickly increased with rest and breathing back to 100%  (pt remained on room air during entire session).    Exercises     PT Diagnosis: Difficulty walking  PT Problem List: Decreased activity tolerance;Decreased mobility;Decreased knowledge of use of DME;Pain PT Treatment Interventions: DME instruction;Gait training;Functional mobility training;Therapeutic activities;Therapeutic exercise;Patient/family education     PT Goals(Current goals can be found in the care plan section) Acute Rehab PT Goals Patient Stated Goal: return to PLOF PT Goal Formulation: With patient Time For Goal Achievement: 08/27/12 Potential to Achieve Goals: Good  Visit Information  Last PT Received On: 08/20/12 Assistance Needed: +1       Prior Functioning  Home Living Family/patient expects to be discharged to:: Private residence Living Arrangements: Spouse/significant other Type of Home: House Home Access: Level entry Home Layout: Laundry or work area in basement Home Equipment: Gilmer Mor - single point Prior Function Level of Independence: Independent Communication Communication: No difficulties    Cognition  Cognition Arousal/Alertness: Awake/alert Behavior During Therapy: WFL for tasks assessed/performed Overall Cognitive Status: Within Functional Limits for tasks assessed    Extremity/Trunk Assessment Lower Extremity Assessment Lower Extremity Assessment: Overall WFL for tasks assessed Cervical / Trunk Assessment Cervical / Trunk Assessment: Normal   Balance    End of Session PT - End of Session Activity Tolerance: Patient limited by fatigue Patient left: in chair;with call bell/phone within reach Nurse Communication: Mobility status  GP     Everado Pillsbury,KATHrine E 08/20/2012, 9:16 AM Zenovia Jarred, PT, DPT 08/20/2012  Pager: [phone]

## 2012-08-20 NOTE — Progress Notes (Signed)
eLink Physician-Brief Progress Note Patient Name: Rodney Green DOB: 09-07-1945 MRN: [id number]  Date of Service  08/20/2012   HPI/Events of Note   BP still hight. Renal failure has improved  eICU Interventions  Hydralazine prn for sbp < 160   Intervention Category Intermediate Interventions: Respiratory distress - evaluation and management  Seraphina Mitchner 08/20/2012, 6:45 AM

## 2012-08-20 NOTE — Progress Notes (Signed)
Received pt from ICU, no change from am assessment.  A&O, sitting up, no c/o, a little hoarse, dghter at bedside

## 2012-08-20 NOTE — Progress Notes (Signed)
eLink Physician-Brief Progress Note Patient Name: Aureliano Oshields Markuson DOB: [birth year] MRN: [id number]  Date of Service  08/20/2012   HPI/Events of Note   SBP 180s. HR 40  eICU Interventions  Increase home norvasc from 5 yto 10mg  per day   Intervention Category Intermediate Interventions: Respiratory distress - evaluation and management  Mayte Diers 08/20/2012, 5:07 AM

## 2012-08-20 NOTE — Progress Notes (Signed)
PULMONARY  / CRITICAL CARE MEDICINE  Name: Rodney Green MRN: [id number] DOB: 03/18/1945    ADMISSION DATE:  08/14/2012 CONSULTATION DATE: 08/15/2012   REFERRING MD : EDP  PRIMARY SERVICE: PCCM   CHIEF COMPLAINT: Hypotension and fever   BRIEF PATIENT DESCRIPTION: This a 67 year old African American male with refractory multiple myeloma IgA subtype (see 6/23 PCCM note for complete list or prior treatments) admitted with neutropenia and bilateral atypical pneumonia with hypoxemia and septic shock. Critical care was asked to admit   SIGNIFICANT EVENTS / STUDIES:  6/23 - requiring intermittent bipap for resp support, fever  6/23 - CT of Chest - no lobar consolidation, pulmonary artery enlargement, osseous findings most consistent with the clinical history of myeloma 6/23 - ECHO - Ef 60-65%, PA peak pressures 31 6/23 - PM Developed worsening resp distress during CT scan requiring intubation and CVC placement 6/25 - Extubated 6/26 - episode of choking on chicken, concern for aspiration / swallowing difficulties   LINES / TUBES:  PIV  6/23 Foley>> 6/26 6/23 CVC L IJ>>>  6/23 ETT>>>6/25  CULTURES:  6/22 BCx2>>>  6/22 UC>>> Enterobacter Aerogenes 6/22 Resp Vira from sputml>>> Parainfluenza 3 6/22 Sputum>>>normal flora ........... 6/23 Bronch BAL>>> Pseudomonas from BAL and PIV 3 6/23 Urine Step Pneumo >> Negative 6/23 Urin Legionella >> negative ............... 6/23 BRONCH RML BAL (Dr. Marchelle Gearing) - Cell count, PCR virus, AFB, Fungus   ANTIBIOTICS:  Ciprofloxacin 6/26>>> Zosyn 6/22>>> 6/26 Vancomycin 6/22>>> 6/24 Azithromycin 6/22>>> 6/24  SUBJECTIVE:   Feels better, breathing is good.     Passed swallow eval 6/27.  VITAL SIGNS: Temp:  [97.5 F (36.4 C)-97.9 F (36.6 C)] 97.5 F (36.4 C) (06/28 0800) Pulse Rate:  [54-71] 54 (06/28 0800) Resp:  [17-22] 18 (06/28 0800) BP: (138-189)/(80-116) 160/96 mmHg (06/28 0800) SpO2:  [95 %-98 %] 97 % (06/28 0800)  INTAKE /  OUTPUT: Intake/Output     06/27 0701 - 06/28 0700 06/28 0701 - 06/29 0700   P.O.  120   I.V. (mL/kg)     IV Piggyback 510    Total Intake(mL/kg) 510 (6.4) 120 (1.5)   Urine (mL/kg/hr) 1200 (0.6)    Total Output 1200     Net -690 +120        Urine Occurrence 3 x    Stool Occurrence 3 x     PHYSICAL EXAMINATION:  General: Chronically-ill appearing male in no distress . Looking much much beter. Sitting in chair Neuro: A&Ox3, follows commands, Denies pain HEENT: Dry mucus membranes, no jvd  Cardiovascular: S1S2 rrr, no m/r/g, pulses +1  Lungs: lungs clear bilaterally. No wheezes audible Abdomen: Soft nontender no organomegaly, BS active  Musculoskeletal: MAE, strength +4 Skin: pink, warm, intact  LABS:  Recent Labs Lab 08/18/12 0340 08/19/12 0430 08/20/12 0415  NA 144 145 140  K 3.5 3.9 3.6  CL 109 112 107  CO2 24 27 25   BUN 53* 47* 45*  CREATININE 1.93* 1.65* 1.52*  GLUCOSE 87 110* 56*   Recent Labs Lab 08/18/12 0340 08/19/12 0430 08/20/12 0415  HGB 7.6* 7.6* 8.1*  HCT 23.4* 24.3* 25.4*  WBC 2.2* 4.0 6.3  PLT 235 236 257   CXR:   ASSESSMENT / PLAN:  PULMONARY  A:  Acute hypoxic respiratory failure-now resolved Pseudomonas PNA Parainfluenza P:  - Continue IS - Continue nebs PRN - Discontinue steroids - See ID  CARDIOVASCULAR  A:  Septic shock - resolved Pulmonary edema - now resolved Hx of HTN P:  -  Hold diovan due to creatinine - Decrease atenolol to 12.5 mg PO BID. - Norvasc started overnight. - If remains hypertensive will add hydralazine given bradycardia with beta blockers. - Hold parameters added - HR<50, SBP <90.  RENAL  A:  Stage III chronic renal disease - Cr 3.35 on admission, baseline sr cr 1.6 (06/2012) . Currently improving but has low phos P:  - Monitor I&0. - Monitor BMP. - Replete K.  GASTROINTESTINAL  A:  Malnutrition due to extended NPO period Concern for dysphagia following chocking episode 6/26 P:  - Diet per  speech pathology.  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 Arbutus Ped. Hypothermic on admission  Severe immunosuppression   P:  - SBQ Heparin. - Monitor CBC. - Continue neupogen. - PRBC for hgb </= 6.9gm%.  INFECTIOUS  A:  Immunosuppressed state  Neutropenia - with hypothermia Opportunistic pneumonia - Parainfluenza 3 positive and Pseudomonas UTI - UC positive for Enterobacter Aerogenes Septic shock - resolved, PCT mildly elevated  P:  - Cipro PO to finish a 14 day course, off all other antibiotics.  ENDOCRINE  A:  Diabetes type 2  - hyperglycemia in setting of high dose steroids.  P:  - CBG monitoring. - SSI insulin. - Continue Glargine at 20 units now that he is taking PO.  NEUROLOGIC  A:  Delirium- now resolved. Denies pain P:  - Monitor. - Supportive care  - Continue oxycontin - PT/OT and OOB to chair.  TODAY'S SUMMARY:   67 year old African American male with refractory multiple myeloma IgA subtype presenting with neutropenia and concern for opportunistic pneumonia with septic shock. His creatinine has returned to his baseline. Had some bradycardia overnight, was asymptomatic, will hold off on increasing his atenolol back to his home dose. Plan for swallow evaluation with SLP and if he passes the study can resume diet per their recommendations. Will hold on increasing his Glargine to his home dose until he is able to consume a normal diet.  Transfer to tele, will keep on PCCM service.  Alyson Reedy, M.D. [facility] Pulmonary/Critical Care Medicine. Pager: [phone]. After hours pager: [phone].

## 2012-08-21 ENCOUNTER — Inpatient Hospital Stay (HOSPITAL_COMMUNITY): Payer: 59

## 2012-08-21 LAB — CBC
Hemoglobin: 8 g/dL — ABNORMAL LOW (ref 13.0–17.0)
MCH: 27.8 pg (ref 26.0–34.0)
MCHC: 31.3 g/dL (ref 30.0–36.0)
MCV: 88.9 fL (ref 78.0–100.0)
RBC: 2.88 MIL/uL — ABNORMAL LOW (ref 4.22–5.81)

## 2012-08-21 LAB — GLUCOSE, CAPILLARY: Glucose-Capillary: 132 mg/dL — ABNORMAL HIGH (ref 70–99)

## 2012-08-21 LAB — BASIC METABOLIC PANEL
BUN: 37 mg/dL — ABNORMAL HIGH (ref 6–23)
CO2: 24 mEq/L (ref 19–32)
Calcium: 9.1 mg/dL (ref 8.4–10.5)
Creatinine, Ser: 1.52 mg/dL — ABNORMAL HIGH (ref 0.50–1.35)
Glucose, Bld: 89 mg/dL (ref 70–99)

## 2012-08-21 LAB — PHOSPHORUS: Phosphorus: 2.8 mg/dL (ref 2.3–4.6)

## 2012-08-21 LAB — LEGIONELLA CULTURE

## 2012-08-21 NOTE — Progress Notes (Signed)
PULMONARY  / CRITICAL CARE MEDICINE  Name: Rodney Green MRN: [id number] DOB: 01/27/1946    ADMISSION DATE:  08/14/2012 CONSULTATION DATE: 08/15/2012   REFERRING MD : EDP  PRIMARY SERVICE: PCCM   CHIEF COMPLAINT: Hypotension and fever   BRIEF PATIENT DESCRIPTION: This a 67 year old African American male with refractory multiple myeloma IgA subtype (see 6/23 PCCM note for complete list or prior treatments) admitted with neutropenia and bilateral atypical pneumonia with hypoxemia and septic shock. Critical care was asked to admit   SIGNIFICANT EVENTS / STUDIES:  6/23 - requiring intermittent bipap for resp support, fever  6/23 - CT of Chest - no lobar consolidation, pulmonary artery enlargement, osseous findings most consistent with the clinical history of myeloma 6/23 - ECHO - Ef 60-65%, PA peak pressures 31 6/23 - PM Developed worsening resp distress during CT scan requiring intubation and CVC placement 6/25 - Extubated 6/26 - episode of choking on chicken, concern for aspiration / swallowing difficulties 6/27- SLP- mild aspiration  risk  LINES / TUBES:  PIV  6/23 Foley>> 6/26 6/23 CVC L IJ>>>  6/23 ETT>>>6/25  CULTURES:  6/22 BCx2>>>  6/22 UC>>> Enterobacter Aerogenes 6/22 Resp Vira from sputml>>> Parainfluenza 3 6/22 Sputum>>>normal flora ........... 6/23 Bronch BAL>>> Pseudomonas from BAL and PIV 3 6/23 Urine Step Pneumo >> Negative 6/23 Urin Legionella >> negative ............... 6/23 BRONCH RML BAL (Dr. Marchelle Gearing) - Cell count, PCR virus, AFB, Fungus   ANTIBIOTICS:  Ciprofloxacin 6/26>>> Zosyn 6/22>>> 6/26 Vancomycin 6/22>>> 6/24 Azithromycin 6/22>>> 6/24  SUBJECTIVE:   Feels better, breathing is good.  Only c/o can't sleep well here. Looking forward to home.   Passed swallow eval 6/27.  VITAL SIGNS: Temp:  [96.5 F (35.8 C)-97.7 F (36.5 C)] 97.5 F (36.4 C) (06/29 2841) Pulse Rate:  [74-78] 74 (06/29 0611) Resp:  [20-23] 21 (06/29 0611) BP:  (144-162)/(95-96) 162/95 mmHg (06/29 0611) SpO2:  [95 %-99 %] 98 % (06/29 0611) Weight:  [79.6 kg (175 lb 7.8 oz)] 79.6 kg (175 lb 7.8 oz) (06/29 0611)  INTAKE / OUTPUT: Intake/Output     06/28 0701 - 06/29 0700 06/29 0701 - 06/30 0700   P.O. 600    IV Piggyback     Total Intake(mL/kg) 600 (7.5)    Urine (mL/kg/hr) 975 (0.5)    Total Output 975     Net -375          Urine Occurrence 2 x    Stool Occurrence 1 x     PHYSICAL EXAMINATION:  General:  no distress . Alert and conversant, NAD Neuro: A&Ox3, follows commands, Denies pain HEENT: Dry mucus membranes, no jvd  Cardiovascular: S1S2 rrr, no m/r/g, pulses +1  Lungs:Bilateral crackle, no cough, unlabored Abdomen: Soft nontender no organomegaly, BS active  Musculoskeletal: MAE, strength +4. Sits easily w/o asistance Skin:  warm, intact  LABS:  Recent Labs Lab 08/19/12 0430 08/20/12 0415 08/21/12 0325  NA 145 140 143  K 3.9 3.6 3.8  CL 112 107 109  CO2 27 25 24   BUN 47* 45* 37*  CREATININE 1.65* 1.52* 1.52*  GLUCOSE 110* 56* 89    Recent Labs Lab 08/19/12 0430 08/20/12 0415 08/21/12 0325  HGB 7.6* 8.1* 8.0*  HCT 24.3* 25.4* 25.6*  WBC 4.0 6.3 6.0  PLT 236 257 286   CXR:   ASSESSMENT / PLAN:  PULMONARY  A:  Acute hypoxic respiratory failure-now resolved . Residual crackles. CXR 1V 4/26 not baseline Pseudomonas PNA Parainfluenza P:  - Continue IS -  Continue nebs PRN - Discontinue steroids -CXR 2V 6/29  CARDIOVASCULAR  A:  Septic shock - resolved Pulmonary edema - now resolved Hx of HTN P:  - Hold diovan due to creatinine - Decrease atenolol to 12.5 mg PO BID. - Norvasc started overnight. - If remains hypertensive will add hydralazine given bradycardia with beta blockers. - Hold parameters added - HR<50, SBP <90.  RENAL  A:  Stage III chronic renal disease - Cr 3.35 on admission, baseline sr cr 1.6 (06/2012) . Currently improving but has low phos P:  - Monitor I&0. - Monitor  BMP.   GASTROINTESTINAL  A:  Malnutrition due to extended NPO period Concern for dysphagia following choking episode 6/26 P:  - Diet Regular, thin per speech pathology.  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 Arbutus Ped. Hypothermic on admission Anemia - Hgb 8  6/29 Severe immunosuppression   P:  - SBQ Heparin. - Monitor CBC. - Continue neupogen. - PRBC for hgb </= 6.9gm%.  INFECTIOUS  A:  Immunosuppressed state  Neutropenia - with hypothermia Opportunistic pneumonia - Parainfluenza 3 positive and Pseudomonas UTI - UC positive for Enterobacter Aerogenes Septic shock - resolved, PCT mildly elevated  P:  - Cipro PO to finish a 14 day course, off all other antibiotics.  ENDOCRINE  A:  Diabetes type 2  - hyperglycemia in setting of high dose steroids.  P:  - CBG monitoring. AM 6/29 89 - SSI insulin. - Continue Glargine at 20 units now that he is taking PO. - Watch for hypoglycemia overnight NEUROLOGIC  A:  Delirium- now resolved. Denies pain P:  - Monitor. - Supportive care  - Continue oxycontin - PT/OT and OOB to chair.  TODAY'S SUMMARY:   67 year old African American male with refractory multiple myeloma IgA subtype presenting with neutropenia and concern for opportunistic pneumonia with septic shock. His creatinine has returned to his baseline. Had some bradycardia overnight, was asymptomatic, will hold off on increasing his atenolol back to his home dose. Will hold on increasing his Glargine to his home dose until he is able to consume a normal diet. Sugars marginally low last 2 days. Keep on PCCM service.  CD Maple Hudson, MD [facility] Pulmonary/Critical Care Medicine. [phone] After hours pager: [phone].

## 2012-08-22 ENCOUNTER — Telehealth: Payer: Self-pay | Admitting: *Deleted

## 2012-08-22 DIAGNOSIS — E1129 Type 2 diabetes mellitus with other diabetic kidney complication: Secondary | ICD-10-CM

## 2012-08-22 DIAGNOSIS — C9002 Multiple myeloma in relapse: Secondary | ICD-10-CM

## 2012-08-22 DIAGNOSIS — N058 Unspecified nephritic syndrome with other morphologic changes: Secondary | ICD-10-CM

## 2012-08-22 LAB — CBC WITH DIFFERENTIAL/PLATELET
Basophils Absolute: 0 10*3/uL (ref 0.0–0.1)
Eosinophils Absolute: 0 10*3/uL (ref 0.0–0.7)
HCT: 24.9 % — ABNORMAL LOW (ref 39.0–52.0)
Lymphocytes Relative: 12 % (ref 12–46)
Lymphs Abs: 0.6 10*3/uL — ABNORMAL LOW (ref 0.7–4.0)
MCH: 27.6 pg (ref 26.0–34.0)
MCHC: 30.9 g/dL (ref 30.0–36.0)
Monocytes Absolute: 0.9 10*3/uL (ref 0.1–1.0)
Neutro Abs: 3.1 10*3/uL (ref 1.7–7.7)
RDW: 15.7 % — ABNORMAL HIGH (ref 11.5–15.5)

## 2012-08-22 LAB — GLUCOSE, CAPILLARY: Glucose-Capillary: 137 mg/dL — ABNORMAL HIGH (ref 70–99)

## 2012-08-22 LAB — BASIC METABOLIC PANEL
BUN: 27 mg/dL — ABNORMAL HIGH (ref 6–23)
CO2: 23 mEq/L (ref 19–32)
Calcium: 9.4 mg/dL (ref 8.4–10.5)
Chloride: 107 mEq/L (ref 96–112)
Creatinine, Ser: 1.51 mg/dL — ABNORMAL HIGH (ref 0.50–1.35)
Glucose, Bld: 116 mg/dL — ABNORMAL HIGH (ref 70–99)

## 2012-08-22 MED ORDER — CIPROFLOXACIN HCL 500 MG PO TABS: 500.0000 mg | ORAL_TABLET | Freq: Two times a day (BID) | ORAL | Status: DC

## 2012-08-22 MED ORDER — ATENOLOL 25 MG PO TABS: 25.0000 mg | ORAL_TABLET | Freq: Two times a day (BID) | ORAL | Status: DC

## 2012-08-22 MED ORDER — VALACYCLOVIR HCL 500 MG PO TABS: 500.0000 mg | ORAL_TABLET | Freq: Two times a day (BID) | ORAL | Status: DC

## 2012-08-22 MED ORDER — OXYCODONE HCL 10 MG PO TB12: 10.0000 mg | ORAL_TABLET | Freq: Two times a day (BID) | ORAL | Status: AC

## 2012-08-22 NOTE — Progress Notes (Signed)
Physical Therapy Treatment Patient Details Name: Rodney Green MRN: [id number] DOB: [birth year] Today's Date: 08/22/2012 Time: 4098-1191 PT Time Calculation (min): 20 min  PT Assessment / Plan / Recommendation  PT Comments   Pt continues to be unsteady during ambulation without RW, therefore encouraged pt to continue use until more steady.  Also needs continued monitoring for O2 as sats dropped to 84-87% on RA with amb.  RN aware.    Follow Up Recommendations  No PT follow up     Does the patient have the potential to tolerate intense rehabilitation     Barriers to Discharge        Equipment Recommendations  Rolling walker with 5" wheels    Recommendations for Other Services    Frequency Min 3X/week   Progress towards PT Goals Progress towards PT goals: Progressing toward goals  Plan Current plan remains appropriate    Precautions / Restrictions Precautions Precautions: Fall Precaution Comments: monitor O2 during amb.  Restrictions Weight Bearing Restrictions: No   Pertinent Vitals/Pain No pain    Mobility  Bed Mobility Bed Mobility: Not assessed Transfers Transfers: Sit to Stand;Stand to Sit Sit to Stand: 5: Supervision;From bed Stand to Sit: 5: Supervision;To bed Details for Transfer Assistance: Supervision for safety with min cues for hand placement.  Ambulation/Gait Ambulation/Gait Assistance: 4: Min assist Ambulation Distance (Feet): 220 Feet (then another 85') Assistive device: None;Rolling walker Ambulation/Gait Assistance Details: Had pt ambulate initially without RW to reassess, however pt continues to be very unsteady and requires use of handrails in hallway or steadying assist from therapist to prevent fall.  Noted improved balance with second amb with RW.  SaO2 on RA was 84-87% following ambulation per pulseox, however increased to 98% quickly with rest, therefore unsure if completely correct.  Pt states that he has pulsox at home and oxygen for use at  night, but can use during day if needed.  Gait Pattern: Step-through pattern;Wide base of support;Trunk flexed Gait velocity: decreased    Exercises     PT Diagnosis:    PT Problem List:   PT Treatment Interventions:     PT Goals (current goals can now be found in the care plan section) Acute Rehab PT Goals Patient Stated Goal: return to PLOF  Visit Information  Last PT Received On: 08/22/12 Assistance Needed: +1    Subjective Data  Subjective: I really hope I can go home today.  Patient Stated Goal: return to PLOF   Cognition  Cognition Arousal/Alertness: Awake/alert Behavior During Therapy: WFL for tasks assessed/performed Overall Cognitive Status: Within Functional Limits for tasks assessed    Balance     End of Session PT - End of Session Activity Tolerance: Patient limited by fatigue Patient left: in bed;with call bell/phone within reach Nurse Communication: Mobility status   GP     Vista Deck 08/22/2012, 8:56 AM

## 2012-08-22 NOTE — Care Management Note (Addendum)
    Page 1 of 2   08/22/2012     3:26:14 PM   CARE MANAGEMENT NOTE 08/22/2012  Patient:  Rodney Green, Rodney Green   Account Number:  [id number]  Date Initiated:  08/15/2012  Documentation initiated by:  DAVIS,RHONDA  Subjective/Objective Assessment:   fever nuetropenia,pna by cxr, resp distress requiring bipap for support.     Action/Plan:   pt lives at home with spouse does have hx of refractory multiple myeloma IgA subtype   Anticipated DC Date:  08/22/2012   Anticipated DC Plan:  HOME/SELF CARE  In-house referral  NA      DC Planning Services  NA      PAC Choice  NA   Choice offered to / List presented to:  NA   DME arranged  NA      DME agency  NA     HH arranged  NA      HH agency  NA   Status of service:  Completed, signed off Medicare Important Message given?  NA - LOS <3 / Initial given by admissions (If response is "NO", the following Medicare IM given date fields will be blank) Date Medicare IM given:   Date Additional Medicare IM given:    Discharge Disposition:  HOME/SELF CARE  Per UR Regulation:  Reviewed for med. necessity/level of care/duration of stay  If discussed at Long Length of Stay Meetings, dates discussed:    Comments:  08/22/12 Rodney Deman RN,BSN NCM [phone] HAS HOME 02 PTA.D/C HOME NO ORDERS OR NEEDS  [id number] Rodney Plater, RN, BSN, CCM:  CHART REVIEWED AND UPDATED.  Next chart review due on [id number]. NO DISCHARGE NEEDS PRESENT AT THIS TIME. CASE MANAGEMENT [phone]

## 2012-08-22 NOTE — Telephone Encounter (Signed)
Pt's oxycontin dose was decreased to 10mg  BID while in the hospital.  He is needing a refill and appears to have pain control with the new rx of 10mg  BID (versus 40mg  BID).  Per dr Donnald Garre, okay for pt to have a rx change of 10mg  BID.  Pt will not be able to pick up the rx until tomorrow and will take a 40mg  tablet tonight since he is out of 10's.  Rx left in rx book for pick up.  SLJ

## 2012-08-22 NOTE — Discharge Summary (Signed)
Physician Discharge Summary  Patient ID: Rodney Green MRN: [id number] DOB/AGE: 26-May-1945 67 y.o.  Admit date: 08/14/2012 Discharge date: 08/22/2012                                                                          DISCHARGE PLAN BY DIAGNOSIS   PULMONARY  A:  Acute hypoxic respiratory failure-now resolved w/ 6/25 . Weaned off steroids.  >persistent ambulatory desats -has home O2 .  Pseudomonas PNA  Parainfluenza   Plan :  Cipro x total of 14 day  Mucinex DM Twice daily  As needed  Cough/congestion  follow up with pulmonary OP setting in 1 week.  Former smoker, may need OP eval for underlying COPD  O2 at 2l/m with walking to keep sat >90%   CARDIOVASCULAR  A:  Septic shock - resolved  Pulmonary edema - now resolved , CXR 6/29 w/ improved aeration, nad .  HTN  P:  - Hold diovan due to creatinine , f/u with PCP  - Continue atenolol to 12.5 mg PO BID.  -  Resume Home Norvasc  -check b/p at home, f/up with PCP    RENAL  A:  Stage III chronic renal disease - Cr 3.35 on admission, baseline sr cr 1.6 (06/2012) . Currently improving but has low phos  P:   follow op , bmet at follow up ov   GASTROINTESTINAL  A:   Concern for dysphagia following choking episode 6/26  P:  - Diet Regular, thin per speech pathology.   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 Arbutus Ped. Hypothermic on admission  Anemia - Hgb 8 6/29  Severe immunosuppression   P:  - follow up with Dr. Arbutus Ped  - Monitor CBC.  - Continue neupogen per  Hem/onc    INFECTIOUS  A:  Immunosuppressed state  Neutropenia - improved  Opportunistic pneumonia - Parainfluenza 3 positive and Pseudomonas  UTI - UC positive for Enterobacter Aerogenes  Septic shock - resolved, PCT mildly elevated   P:  - Cipro PO to finish a 14 day course, off all other antibiotics.   ENDOCRINE  A:   Diabetes type 2 - hyperglycemia in setting of high dose steroids.  P:   -resume home regimen at discharge   NEUROLOGIC  A:  Delirium- now resolved.  Chronic pain  P:  - cont home regimen                  DISCHARGE SUMMARY   Rodney Green is a 67 y.o. y/o male with a PMH of  refractory multiple myeloma IgA subtype (  for complete list or prior treatments see below ) admitted 6/23  with neutropenia and bilateral atypical pneumonia with hypoxemia and septic shock. Critical care was asked to admit to ICU at [facility] .  Presented with  with neutropenia and concern for opportunistic pneumonia with septic shock. He was treated with aggressive IV abx regimen, pulmonary hygiene . Unfortunately continued to decompensate and required intubation on 6/23 and was liberated from vent on 6/25.  CX data returned Bronch BAL with Psuedomonas and PIV 3 . Urine  was positive for enterobacter . Abx were weaned to Cipro to complete 14 d course at discharge.  He was treated with course of steroids that was weaned to off prior to discharge.   Hospital Stay was complicated by acute renal failure with scr peak at 3.5 , prior to discharge scr returned to o his baseline. ARB was held and to be looked at OP setting with PCP regarding restarting.  Had some bradycardia during admission . Echo showed EF 60-65%, PAP 31. Atenolol was decreased with improvement.  DM was controlled with SSI and glarigine (on lantus at home and SSI )  Episode of choking , Speech swallow eval rec for reg diet and thin liquids.  Had initial delirium with critical illness that resolved . Has chronic pain with adequate control. Continue on home regimen.             SIGNIFICANT EVENTS/ DIAGNOSTIC STUDIES 6/23 - requiring intermittent bipap for resp support, fever  6/23 - CT of Chest - no lobar consolidation, pulmonary artery enlargement, osseous findings most consistent with the clinical history of myeloma  6/23 - ECHO - Ef 60-65%, PA peak  pressures 31  6/23 - PM Developed worsening resp distress during CT scan requiring intubation and CVC placement  6/25 - Extubated  6/26 - episode of choking on chicken, concern for aspiration / swallowing difficulties  6/27- SLP- mild aspiration risk   MICRO DATA  6/22 BCx2>>>  6/22 UC>>> Enterobacter Aerogenes  6/22 Resp Vira from sputml>>> Parainfluenza 3  6/22 Sputum>>>normal flora  ...........  6/23 Bronch BAL>>> Pseudomonas from BAL and PIV 3  6/23 Urine Step Pneumo >> Negative  6/23 Urin Legionella >> negative  ...............  6/23 BRONCH RML BAL (Dr. Marchelle Gearing) - Cell count, PCR virus, AFB, Fungus   ANTIBIOTICS Ciprofloxacin 6/26>>> CONT AT DISCHARGE FOR TOTAL OF 14 D  Zosyn 6/22>>> 6/26  Vancomycin 6/22>>> 6/24  Azithromycin 6/22>>> 6/24   CONSULTS None    TUBES / LINES 6/23 Foley>> 6/26  6/23 CVC L IJ>>> 6/30 6/23 ETT>>>6/25   Discharge Exam: General: no distress . Alert and conversant, NAD  Neuro: A&Ox3, follows commands, Denies pain  HEENT: Dry mucus membranes, no jvd  Cardiovascular: S1S2 rrr, no m/r/g, pulses +1  Lungs:faint bibasilar crackles  unlabored  Abdomen: Soft nontender no organomegaly, BS active  Musculoskeletal: MAE, strength +4. Sits easily w/o asistance  Skin: warm, intact   Filed Vitals:   08/21/12 0611 08/21/12 1403 08/21/12 2140 08/22/12 0530  BP: 162/95 151/91 132/78 159/100  Pulse: 74 74 73 73  Temp: 97.5 F (36.4 C) 97.5 F (36.4 C) 98 F (36.7 C) 97.4 F (36.3 C)  TempSrc: Oral Oral Oral Oral  Resp: 21 18 20 20   Height:      Weight: 79.6 kg (175 lb 7.8 oz)   78.9 kg (173 lb 15.1 oz)  SpO2: 98% 94% 98% 96%     Discharge Labs  BMET  Recent Labs Lab 08/15/12 1915  08/16/12 1400 08/17/12 0400 08/18/12 0340 08/19/12 0430 08/20/12 0415 08/21/12 0325 08/22/12 0420  NA 134*  < > 139 141 144 145 140 143 139  K 4.6  < > 4.3 4.1 3.5 3.9 3.6 3.8 4.1  CL 100  < > 105 107 109 112 107 109 107  CO2 23  < > 23 24 24 27  25 24 23   GLUCOSE 188*  < > 276* 218* 87 110* 56* 89 116*  BUN 48*  < > 50* 52* 53*  47* 45* 37* 27*  CREATININE 2.52*  < > 2.33* 2.10* 1.93* 1.65* 1.52* 1.52* 1.51*  CALCIUM 8.6  < > 8.4 8.7 8.8 9.0 9.1 9.1 9.4  MG 1.8  --  2.0 2.2  --  2.1 1.8 1.8  --   PHOS 4.1  --   --   --   --  1.8* 2.9 2.8  --   < > = values in this interval not displayed.  CBC  Recent Labs Lab 08/20/12 0415 08/21/12 0325 08/22/12 0420  HGB 8.1* 8.0* 7.7*  HCT 25.4* 25.6* 24.9*  WBC 6.3 6.0 4.6  PLT 257 286 278    Anti-Coagulation  Recent Labs Lab 08/15/12 1915  INR 1.21    Discharge Orders   Future Appointments Provider Department Dept Phone   09/22/2012 2:15 PM Kalman Shan, MD [facility] [phone]   Future Orders Complete By Expires     Call MD for:  difficulty breathing, headache or visual disturbances  As directed     Call MD for:  hives  As directed     Call MD for:  redness, tenderness, or signs of infection (pain, swelling, redness, odor or green/yellow discharge around incision site)  As directed     Call MD for:  severe uncontrolled pain  As directed     Call MD for:  temperature >100.4  As directed     Call MD for:  As directed     Scheduling Instructions:      For blood pressure >160    Diet - low sodium heart healthy  As directed     Discharge instructions  As directed     Comments:      Follow up at [facility] in 1 week, call if develop worsening shortness of breath Follow up with family doctor in 1 week to have blood pressure follow up    Discontinue central line  As directed     Increase activity slowly  As directed             Follow-up Information   Follow up with POLITE,RONALD D, MD. Schedule an appointment as soon as possible for a visit in 1 week. (need to follow up for blood pressure management )    Contact information:   [address] [place] [address] [phone]       Follow up with PARRETT,TAMMY, NP On 09/01/2012.  ([facility] 09/01/12 at 11:15 with bmet )    Contact information:   [address] [place] [address] [phone]       Follow up with [facility], MD On 09/22/2012. [facility] 09/22/12 at 2:15 )    Contact information:   [address] [place] [address] [phone]          Medication List    STOP taking these medications       azithromycin 250 MG tablet  Commonly known as:  ZITHROMAX     valsartan-hydrochlorothiazide 320-25 MG per tablet  Commonly known as:  DIOVAN-HCT      TAKE these medications       amLODipine 5 MG tablet  Commonly known as:  NORVASC  Take 5 mg by mouth daily.     aspirin 81 MG tablet  Take 81 mg by mouth daily.     atenolol 25 MG tablet  Commonly known as:  TENORMIN  Take 1 tablet (25 mg total) by mouth 2 (two) times daily.  bisacodyl 5 MG EC tablet  Generic drug:  bisacodyl  3 tabs every other day     ciprofloxacin 500 MG tablet  Commonly known as:  CIPRO  Take 1 tablet (500 mg total) by mouth 2 (two) times daily.     dexamethasone 4 MG tablet  Commonly known as:  DECADRON  TAKE 10 TABLETS (40 MG) ONCE WEEKLY     diphenhydrAMINE 25 MG tablet  Commonly known as:  SOMINEX  Take 25 mg by mouth at bedtime.     docusate sodium 100 MG capsule  Commonly known as:  COLACE  Take 2 capsules by mouth twice daily     filgrastim 480 MCG/0.8ML Soln injection  Commonly known as:  NEUPOGEN  Inject 0.8 mLs (480 mcg total) into the skin as directed.     HUMALOG 100 UNIT/ML injection  Generic drug:  insulin lispro  Sliding scale     HYDROmorphone 4 MG tablet  Commonly known as:  DILAUDID  Take 4 mg by mouth every 4 (four) hours as needed for pain.     insulin glargine 100 UNIT/ML injection  Commonly known as:  LANTUS  Inject 64 Units into the skin at bedtime.     mineral oil external liquid  by Does not apply route every other day.     oxycodone 5 MG capsule  Commonly known as:  OXY-IR  Take 1 capsule  (5 mg total) by mouth every 4 (four) hours as needed.     oxyCODONE 40 MG 12 hr tablet  Commonly known as:  OXYCONTIN  Take 1 tablet (40 mg total) by mouth every 12 (twelve) hours.     pomalidomide 4 MG capsule  Commonly known as:  POMALYST  Take 1 capsule (4 mg total) by mouth daily.     senna 8.6 MG tablet  Commonly known as:  SENOKOT  Take 3 tablets by mouth every other day.     simvastatin 20 MG tablet  Commonly known as:  ZOCOR  Take 20 mg by mouth every evening.     TRAVATAN Z 0.004 % Soln ophthalmic solution  Generic drug:  Travoprost (BAK Free)  Place 1 drop into both eyes at bedtime.     valACYclovir 500 MG tablet  Commonly known as:  VALTREX  Take 1 tablet (500 mg total) by mouth 2 (two) times daily.          Disposition:   Discharged Condition: Rodney Green has met maximum benefit of inpatient care and is medically stable and cleared for discharge.  Patient is pending follow up as above.      Time spent on disposition:  Greater than 35 minutes.   SignedRubye Oaks NP-C  [facility] Office: [phone]   Levy Pupa, MD, PhD 08/22/2012, 12:20 PM  Pulmonary and Critical Care [phone] or if no answer [phone]

## 2012-08-23 ENCOUNTER — Telehealth: Payer: Self-pay | Admitting: Internal Medicine

## 2012-08-23 ENCOUNTER — Telehealth: Payer: Self-pay | Admitting: *Deleted

## 2012-08-23 LAB — CYTOMEGALOVIRUS PCR, QUALITATIVE: Cytomegalovirus DNA: DETECTED — AB

## 2012-08-23 NOTE — Telephone Encounter (Signed)
Per Dr Donnald Garre, pt needs f/u this week, unable to come to appt last week due to being hospitalized.  Onc tx schedule filled out.  SLJ

## 2012-08-23 NOTE — Telephone Encounter (Signed)
lvm for pt regarding to 7.3.14 appt.Marland KitchenMarland KitchenMarland Kitchen

## 2012-08-24 ENCOUNTER — Other Ambulatory Visit: Payer: Self-pay | Admitting: *Deleted

## 2012-08-24 NOTE — Telephone Encounter (Signed)
Pt's wife called stating that they had trouble finding a pharmacy that would be able to fill the oxycontin 10mg  BID.  They were not in stock and the one pharmacy that could supply them was out of their insurance network so they could not get it filled.  Per Dr Donnald Garre, okay to give pt 20mg  BID oxycontin.  Called their local pharmacy [facility] on [address] to ask whether they had 20mg  oxycontins.  Also asked if they had 10mg  oxycontins.  They stated they do have enough in stock.  Informed [facility].  She stated that as of yesterday they did not but she would try to go there again to get the 10mg  rx filled.  Otherwise, she will call us back and we will give him a rx for 20mg  oxycontin BID.  SLJ

## 2012-08-25 ENCOUNTER — Ambulatory Visit: Payer: Self-pay | Admitting: Internal Medicine

## 2012-08-25 ENCOUNTER — Other Ambulatory Visit: Payer: Self-pay | Admitting: Lab

## 2012-08-29 ENCOUNTER — Ambulatory Visit (HOSPITAL_BASED_OUTPATIENT_CLINIC_OR_DEPARTMENT_OTHER): Payer: 59 | Admitting: Internal Medicine

## 2012-08-29 ENCOUNTER — Other Ambulatory Visit: Payer: Self-pay | Admitting: Medical Oncology

## 2012-08-29 ENCOUNTER — Other Ambulatory Visit (HOSPITAL_BASED_OUTPATIENT_CLINIC_OR_DEPARTMENT_OTHER): Payer: 59 | Admitting: Lab

## 2012-08-29 ENCOUNTER — Telehealth: Payer: Self-pay | Admitting: Internal Medicine

## 2012-08-29 ENCOUNTER — Encounter (HOSPITAL_COMMUNITY)
Admission: RE | Admit: 2012-08-29 | Discharge: 2012-08-29 | Disposition: A | Discharge status: Home / Self Care | Payer: 59 | Source: Ambulatory Visit | Attending: Internal Medicine | Admitting: Internal Medicine

## 2012-08-29 ENCOUNTER — Encounter: Payer: Self-pay | Admitting: Internal Medicine

## 2012-08-29 VITALS — BP 154/92 | HR 73 | Temp 97.4°F | Resp 18 | Ht 65.0 in | Wt 178.4 lb

## 2012-08-29 DIAGNOSIS — D649 Anemia, unspecified: Secondary | ICD-10-CM | POA: Insufficient documentation

## 2012-08-29 DIAGNOSIS — R5381 Other malaise: Secondary | ICD-10-CM

## 2012-08-29 DIAGNOSIS — C9002 Multiple myeloma in relapse: Secondary | ICD-10-CM

## 2012-08-29 DIAGNOSIS — R5383 Other fatigue: Secondary | ICD-10-CM

## 2012-08-29 LAB — COMPREHENSIVE METABOLIC PANEL (CC13)
ALT: 13 U/L (ref 0–55)
AST: 21 U/L (ref 5–34)
Albumin: 2.1 g/dL — ABNORMAL LOW (ref 3.5–5.0)
BUN: 38.6 mg/dL — ABNORMAL HIGH (ref 7.0–26.0)
CO2: 21 mEq/L — ABNORMAL LOW (ref 22–29)
Calcium: 9.6 mg/dL (ref 8.4–10.4)
Chloride: 107 mEq/L (ref 98–109)
Creatinine: 1.8 mg/dL — ABNORMAL HIGH (ref 0.7–1.3)
Potassium: 4.3 mEq/L (ref 3.5–5.1)

## 2012-08-29 LAB — CBC WITH DIFFERENTIAL/PLATELET
Eosinophils Absolute: 0 10*3/uL (ref 0.0–0.5)
HCT: 22.3 % — ABNORMAL LOW (ref 38.4–49.9)
HGB: 7.3 g/dL — ABNORMAL LOW (ref 13.0–17.1)
LYMPH%: 8.5 % — ABNORMAL LOW (ref 14.0–49.0)
MONO#: 0.4 10*3/uL (ref 0.1–0.9)
NEUT#: 4.1 10*3/uL (ref 1.5–6.5)
NEUT%: 82.7 % — ABNORMAL HIGH (ref 39.0–75.0)
Platelets: 467 10*3/uL — ABNORMAL HIGH (ref 140–400)
WBC: 5 10*3/uL (ref 4.0–10.3)
lymph#: 0.4 10*3/uL — ABNORMAL LOW (ref 0.9–3.3)

## 2012-08-29 NOTE — Progress Notes (Signed)
Called for HAR. 

## 2012-08-29 NOTE — Patient Instructions (Signed)
Continue to hold Pomalyst for now. I will arrange for you to receive 2 units of PRBCs transfusion in the next few days. Followup visit in one week.

## 2012-08-29 NOTE — Progress Notes (Signed)
[facility] [phone]   [phone]  OFFICE PROGRESS NOTE  Katy Apo, MD [address] [place] [address]  DIAGNOSIS:  Multiple myeloma IgA subtype diagnosed in November, 2009.   PRIOR THERAPY:  1. Status post seven cycles of systemic chemotherapy with Revlimid and low-dose Betatron with partial response. The last dose was given June, 2010. 2. Status post chemotherapy with weekly Velcade in a patient on Decadron 40 mg on a weekly basis, status post 75 weekly doses. The last dose was given May 04, 2008. 3. Status post treatment again with Velcade and Decadron. The patient was awaiting stem cell transplant at [facility]. Last dose was given Jul 16, 2009. 4. The patient was not considered a good candidate for autologous stem cell transplant at that time secondary to poor mobilization of his stem cells.  5. Status post six cycles of systemic chemotherapy with Velcade, Cytoxan and Betatron. Last dose was given March 25, 2010. This continued secondary to his disease progression. 6. Status post ten cycles of systemic chemotherapy with Velcade. Doxil and Decadron. Last dose was given on 10/31/2010. Currently on hold because of concern about cardiac toxicity.  CURRENT THERAPY:  1. Pomalyst 4 mg by mouth daily for 21 days every 28 days, completed cycle #9 and here to start cycle #11 on 07/17/2012. 2. Decadron 40 mg by mouth weekly    INTERVAL HISTORY: Rodney Green 67 y.o. male returns to the clinic today for followup visit accompanied by his wife. The patient is feeling a little bit better today. He was recently admitted to [facility] with septic shock requiring intubation after Pseudomonas pneumonia. He still on treatment with Cipro 500 mg by mouth twice a day. The patient continues to complain of fatigue and weakness. He denied having any significant chest pain but has shortness breath with  exertion and no cough or hemoptysis. He denied having any significant nausea or vomiting. He lost more than 15 pounds during his hospitalization. He has been off treatment with Pomalyst in the last 10 days. He is here today for evaluation before starting his treatment.   MEDICAL HISTORY: Past Medical History  Diagnosis Date  . Allergy   . Diabetes mellitus   . Hypertension   . Hepatitis C   . Multiple myeloma   . Multiple myeloma   . Chronic kidney disease   . Shortness of breath     ALLERGIES:  is allergic to ace inhibitors.  MEDICATIONS:  Current Outpatient Prescriptions  Medication Sig Dispense Refill  . amLODipine (NORVASC) 5 MG tablet Take 5 mg by mouth daily.        Marland Kitchen aspirin 81 MG tablet Take 81 mg by mouth daily.      Marland Kitchen atenolol (TENORMIN) 25 MG tablet Take 1 tablet (25 mg total) by mouth 2 (two) times daily.  60 tablet  1  . bisacodyl (BISACODYL) 5 MG EC tablet 3 tabs every other day      . ciprofloxacin (CIPRO) 500 MG tablet Take 1 tablet (500 mg total) by mouth 2 (two) times daily.  20 tablet  0  . dexamethasone (DECADRON) 4 MG tablet TAKE 10 TABLETS (40 MG) ONCE WEEKLY  130 tablet  0  . diphenhydrAMINE (SOMINEX) 25 MG tablet Take 25 mg by mouth at bedtime.      . docusate sodium (COLACE) 100 MG capsule Take 2 capsules by mouth twice daily      .  filgrastim (NEUPOGEN) 480 MCG/0.8ML SOLN injection Inject 0.8 mLs (480 mcg total) into the skin as directed.  4 Syringe  1  . HUMALOG 100 UNIT/ML injection Sliding scale      . HYDROmorphone (DILAUDID) 4 MG tablet Take 4 mg by mouth every 4 (four) hours as needed for pain.      Marland Kitchen insulin glargine (LANTUS) 100 UNIT/ML injection Inject 64 Units into the skin at bedtime.       . mineral oil external liquid by Does not apply route every other day.      Marland Kitchen oxycodone (OXY-IR) 5 MG capsule Take 1 capsule (5 mg total) by mouth every 4 (four) hours as needed.  30 capsule  0  . oxyCODONE (OXYCONTIN) 10 MG 12 hr tablet Take 1 tablet (10 mg  total) by mouth every 12 (twelve) hours.  60 tablet  0  . pomalidomide (POMALYST) 4 MG capsule Take 1 capsule (4 mg total) by mouth daily.  21 capsule  0  . senna (SENOKOT) 8.6 MG tablet Take 3 tablets by mouth every other day.       . simvastatin (ZOCOR) 20 MG tablet Take 20 mg by mouth every evening.      . TRAVATAN Z 0.004 % SOLN ophthalmic solution Place 1 drop into both eyes at bedtime.      . valACYclovir (VALTREX) 500 MG tablet Take 1 tablet (500 mg total) by mouth 2 (two) times daily.  14 tablet  1   No current facility-administered medications for this visit.    SURGICAL HISTORY:  Past Surgical History  Procedure Laterality Date  . Circumcision    . Hand surgery    . Abdominal abcess      REVIEW OF SYSTEMS:  A comprehensive review of systems was negative except for: Constitutional: positive for fatigue and weight loss Respiratory: positive for dyspnea on exertion   PHYSICAL EXAMINATION: General appearance: alert, cooperative, fatigued and no distress Head: Normocephalic, without obvious abnormality, atraumatic Neck: no adenopathy Lymph nodes: Cervical, supraclavicular, and axillary nodes normal. Resp: clear to auscultation bilaterally Back: symmetric, no curvature. ROM normal. No CVA tenderness. Cardio: regular rate and rhythm, S1, S2 normal, no murmur, click, rub or gallop GI: soft, non-tender; bowel sounds normal; no masses,  no organomegaly Extremities: extremities normal, atraumatic, no cyanosis or edema Neurologic: Alert and oriented X 3, normal strength and tone. Normal symmetric reflexes. Normal coordination and gait  ECOG PERFORMANCE STATUS: 1 - Symptomatic but completely ambulatory  Blood pressure 154/92, pulse 73, temperature 97.4 F (36.3 C), temperature source Oral, resp. rate 18, height 5\' 5"  (1.651 m), weight 178 lb 6.4 oz (80.922 kg).  LABORATORY DATA: Lab Results  Component Value Date   WBC 5.0 08/29/2012   HGB 7.3* 08/29/2012   HCT 22.3* 08/29/2012   MCV  88.6 08/29/2012   PLT 467* 08/29/2012      Chemistry      Component Value Date/Time   NA 139 08/22/2012 0420   NA 137 07/14/2012 1010   K 4.1 08/22/2012 0420   K 4.5 07/14/2012 1010   CL 107 08/22/2012 0420   CL 101 07/14/2012 1010   CO2 23 08/22/2012 0420   CO2 27 07/14/2012 1010   BUN 27* 08/22/2012 0420   BUN 30.0* 07/14/2012 1010   CREATININE 1.51* 08/22/2012 0420   CREATININE 1.6* 07/14/2012 1010   GLU 293* 08/12/2009 1322      Component Value Date/Time   CALCIUM 9.4 08/22/2012 0420   CALCIUM 9.7 07/14/2012  1010   ALKPHOS 63 08/15/2012 1915   ALKPHOS 127 07/14/2012 1010   AST 12 08/15/2012 1915   AST 8 07/14/2012 1010   ALT 9 08/15/2012 1915   ALT 12 07/14/2012 1010   BILITOT 0.6 08/15/2012 1915   BILITOT 0.53 07/14/2012 1010       RADIOGRAPHIC STUDIES: Dg Chest 2 View  08/21/2012   *RADIOLOGY REPORT*  Clinical Data: Multiple myeloma.  Follow up of pseudomonas pneumonia, CHF.  CHEST - 2 VIEW  Comparison: 07/25/2012.  Findings: The interstitium has cleared.  No edema.  No focal airspace abnormalities.  The heart, mediastinum and hilar contours remain normal.  There are no effusions or pneumothoraces.  No change in position of left IJ central catheter.  No bony abnormalities.  IMPRESSION: Interval clearing of the interstitium.  No focal abnormalities.   Original Report Authenticated By: Sander Radon, M.D.   Ct Chest Wo Contrast  08/15/2012   *RADIOLOGY REPORT*  Clinical Data: Neutropenic.  Myeloma.  Sepsis.  Lung injury. Cough.  Shortness of breath.  CT CHEST WITHOUT CONTRAST  Technique:  Multidetector CT imaging of the chest was performed following the standard protocol without IV contrast.  Comparison: Plain films earlier today.  Most recent CT of 02/25/2012  Findings: Lungs/pleura: Moderate motion degradation throughout. No lobar consolidation.  Improved aeration since the prior exam. Residual thickening along the peribronchovascular interstitium, including in the left upper lobe on image  24/series 5.  This is nonspecific.  No pleural fluid.  Heart/Mediastinum: Tortuous thoracic aorta.  Mild cardiomegaly with dense coronary artery atherosclerosis. No pericardial effusion. Pulmonary artery enlargement, with outflow tract measuring 3.8 cm. No mediastinal or definite hilar adenopathy, given limitations of unenhanced CT.  Upper abdomen: Motion degradation continuing into the upper abdomen.  Bones/Musculoskeletal:  Lipoma within the right supraspinatus musculature of 2.7 cm.  Progression of markedly heterogeneous marrow density.  Expansile anterior left seventh rib lesion on image 45/series 2 is new or progressive.  Healing 11th and 12th posterior right rib fractures  Vertebral body height loss at multiple levels with prior vertebral augmentation at L1.  IMPRESSION:  1.  Moderate motion degraded exam.  Overall improved aeration since the prior CT with suspicion of residual nonspecific peribronchovascular interstitial thickening. 2.  Cardiomegaly with advanced coronary artery atherosclerosis. 3. Pulmonary artery enlargement suggests pulmonary arterial hypertension. 4.  Progression of osseous findings most consistent with the clinical history of myeloma.   Original Report Authenticated By: Jeronimo Greaves, M.D.   US Renal  08/16/2012   *RADIOLOGY REPORT*  Clinical Data: Acute superimposed upon chronic renal failure. Current history of multiple myeloma, hypertension, diabetes, and hepatitis C.  RENAL/URINARY TRACT ULTRASOUND COMPLETE  Comparison:  Visualized kidneys on lumbar spine MRI 03/29/2008.  Findings:  Right Kidney:  Echogenic parenchyma consistent with medical renal disease.  Numerous cysts as noted on the prior MR, the largest arising from the mid kidney measuring approximately 3.9 x 4.0 x 2.8 cm, containing a thick septation and internal echoes.  No solid parenchymal masses.  No hydronephrosis.  Approximately 13.2 cm in length.  Left Kidney:  Echogenic parenchyma consistent with medical renal  disease.  Numerous cysts as noted on the prior MR, the largest arising from the lower pole measuring approximate 2.1 x 1.4 x 1.5 cm.  No solid parenchymal masses.  No hydronephrosis. Approximately 12.7 cm in length.  Bladder:  Decompressed by Foley catheter.  IMPRESSION:  1.  No evidence of hydronephrosis involving either kidney. 2.  Echogenic renal parenchyma bilaterally consistent  with medical renal disease. 3.  Numerous bilateral renal cysts, the largest approximating 4 cm in the mid right kidney.  This largest cyst has a thick internal septation and internal echoes.  Ultrasound follow-up of this cyst in 6 months may be helpful to confirm stability.   Original Report Authenticated By: Hulan Saas, M.D.   Dg Chest Port 1 View  08/18/2012   *RADIOLOGY REPORT*  Clinical Data:  Pseudomonas pneumonia, shortness of breath, weakness, hypertension, diabetes, para influenza, evaluate for airspace disease  PORTABLE CHEST - 1 VIEW  Comparison: Portable exam 0958 hours compared to 08/16/2012  Findings: Left jugular central venous catheter tip projects over SVC. Endotracheal tube no longer identified. Enlargement of cardiac silhouette with pulmonary vascular congestion. Low lung volumes with bibasilar atelectasis. No gross consolidation, pleural effusion or pneumothorax.  IMPRESSION: Enlargement of cardiac silhouette with pulmonary vascular congestion. Low lung volumes with bibasilar atelectasis.   Original Report Authenticated By: Ulyses Southward, M.D.   Dg Chest Port 1 View  08/16/2012   *RADIOLOGY REPORT*  Clinical Data: ET tube placement.  PORTABLE CHEST - 1 VIEW  Comparison: 08/15/2012  Findings: Support devices remain in place, unchanged.  Improving bibasilar atelectasis.  Heart is mildly enlarged.  Low lung volumes, slightly improved since prior study.  No significant effusion.  No acute bony abnormality.  IMPRESSION: Improving aeration and bibasilar atelectasis.   Original Report Authenticated By: Charlett Nose,  M.D.   Dg Chest Port 1 View  08/15/2012   *RADIOLOGY REPORT*  Clinical Data: Respiratory failure, endotracheal tube and central line placement  PORTABLE CHEST - 1 VIEW  Comparison: Portable chest x-ray of 08/15/2012 and CT chest of the same date  Findings: The tip of the endotracheal tube is approximately 5.0 cm above the carina.  The lungs are poorly aerated and there is more opacity at the bases right much greater than left.  These opacities most likely are due to atelectasis with some elevation of the right hemidiaphragm.  Cardiomegaly is stable.  A left central venous line is present with the tip overlying the upper SVC.  No pneumothorax is seen.  IMPRESSION:  1.  Poor inspiration with basilar opacities right greater than left most consistent with atelectasis. 2.  Tip of endotracheal tube 5.0 cm above the carina. 3.  Left IJ central venous line tip overlies the region of the upper SVC.   Original Report Authenticated By: Dwyane Dee, M.D.   Dg Chest Port 1 View  08/15/2012   *RADIOLOGY REPORT*  Clinical Data: Pneumonia.  Increasing shortness of breath.  PORTABLE CHEST - 1 VIEW  Comparison: 08/14/2012  Findings: Low lung volumes.  Mild vascular congestion and continued interstitial prominence could reflect interstitial edema.  No visible effusions.  No real change since prior study.  No acute bony abnormality.  IMPRESSION: Continued low lung volumes, vascular congestion and interstitial prominence.  No change.   Original Report Authenticated By: Charlett Nose, M.D.   Dg Chest Port 1 View  08/14/2012   *RADIOLOGY REPORT*  Clinical Data: Shortness of breath.  PORTABLE CHEST - 1 VIEW  Comparison: 05/19/2012  Findings: Lung volumes are low bilaterally.  Compared to the prior study, there is a definite increase in pulmonary vascularity suggestive of interstitial edema/CHF.  Heart size is normal.  No significant pleural fluid is identified.  IMPRESSION: Increase in pulmonary vascularity suggestive of interstitial  edema/CHF.   Original Report Authenticated By: Irish Lack, M.D.    ASSESSMENT AND PLAN: This is a very pleasant 66 years  old [place] American male with recurrent multiple myeloma currently on treatment with Pomalyst that has been on hold for the last 10 days secondary to recent hospitalization for septic shock from Pseudomonas pneumonia requiring intubation. The patient is recovering slowly from his recent hospitalization. He will continue on his current treatment with several 500 mg by mouth twice a day. He has significant fatigue and weakness secondary to anemia. I will arrange for the patient to receive 2 units of PRBCs transfusion in the next few days. I will delay the start of his treatment with Pomalyst until next week to give him more time for recovery. The patient would come back for followup visit in one week for evaluation before starting his treatment. He was advised to call immediately if he has any concerning symptoms in the interval.  All questions were answered. The patient knows to call the clinic with any problems, questions or concerns. We can certainly see the patient much sooner if necessary.

## 2012-08-29 NOTE — Telephone Encounter (Signed)
gv pt appt schedule for July. appt for 1 wk scheduled w/MM due to AJ may be @ [facility]. Also due to blood being Friday 7/11 per desk nurse no need to send pt back to lb today. Per 2nd pof pt will return to lb 7/9 and have inf 7/11. Pt aware that per Diane if a spot becomes available 7/9 we will do transfusion then.

## 2012-08-30 ENCOUNTER — Encounter: Payer: Self-pay | Admitting: Internal Medicine

## 2012-08-30 ENCOUNTER — Other Ambulatory Visit (INDEPENDENT_AMBULATORY_CARE_PROVIDER_SITE_OTHER): Payer: 59

## 2012-08-30 ENCOUNTER — Telehealth: Payer: Self-pay | Admitting: Internal Medicine

## 2012-08-30 ENCOUNTER — Ambulatory Visit (INDEPENDENT_AMBULATORY_CARE_PROVIDER_SITE_OTHER): Payer: 59 | Admitting: Internal Medicine

## 2012-08-30 ENCOUNTER — Ambulatory Visit (INDEPENDENT_AMBULATORY_CARE_PROVIDER_SITE_OTHER)
Admission: RE | Admit: 2012-08-30 | Discharge: 2012-08-30 | Disposition: A | Discharge status: Home / Self Care | Payer: 59 | Source: Ambulatory Visit | Attending: Internal Medicine | Admitting: Internal Medicine

## 2012-08-30 VITALS — BP 128/76 | HR 93 | Temp 99.5°F | Ht 66.0 in | Wt 179.0 lb

## 2012-08-30 DIAGNOSIS — R509 Fever, unspecified: Secondary | ICD-10-CM

## 2012-08-30 DIAGNOSIS — R5381 Other malaise: Secondary | ICD-10-CM

## 2012-08-30 DIAGNOSIS — A419 Sepsis, unspecified organism: Secondary | ICD-10-CM

## 2012-08-30 DIAGNOSIS — J189 Pneumonia, unspecified organism: Secondary | ICD-10-CM

## 2012-08-30 LAB — BASIC METABOLIC PANEL
CO2: 21 mEq/L (ref 19–32)
Calcium: 9.4 mg/dL (ref 8.4–10.5)
Chloride: 104 mEq/L (ref 96–112)
Creatinine, Ser: 1.6 mg/dL — ABNORMAL HIGH (ref 0.4–1.5)
Glucose, Bld: 62 mg/dL — ABNORMAL LOW (ref 70–99)

## 2012-08-30 LAB — URINALYSIS, ROUTINE W REFLEX MICROSCOPIC
Ketones, ur: NEGATIVE
Total Protein, Urine: 100
Urine Glucose: NEGATIVE
Urobilinogen, UA: 0.2 (ref 0.0–1.0)

## 2012-08-30 LAB — CBC WITH DIFFERENTIAL/PLATELET
Basophils Relative: 0 % (ref 0.0–3.0)
Eosinophils Relative: 0.1 % (ref 0.0–5.0)
HCT: 23.9 % — CL (ref 39.0–52.0)
Hemoglobin: 8 g/dL — CL (ref 13.0–17.0)
Lymphs Abs: 0.3 10*3/uL — ABNORMAL LOW (ref 0.7–4.0)
Monocytes Relative: 8.6 % (ref 3.0–12.0)
Neutro Abs: 3.6 10*3/uL (ref 1.4–7.7)
RBC: 2.71 Mil/uL — ABNORMAL LOW (ref 4.22–5.81)
WBC: 4.2 10*3/uL — ABNORMAL LOW (ref 4.5–10.5)

## 2012-08-30 LAB — HEPATIC FUNCTION PANEL
Albumin: 2.3 g/dL — ABNORMAL LOW (ref 3.5–5.2)
Alkaline Phosphatase: 73 U/L (ref 39–117)
Total Protein: 7.9 g/dL (ref 6.0–8.3)

## 2012-08-30 LAB — MAGNESIUM: Magnesium: 1.6 mg/dL (ref 1.5–2.5)

## 2012-08-30 NOTE — Patient Instructions (Addendum)
#  Fever and Fatigue Could be a viral infection passing trhough with some dehydration and low magnesium Continue and finish cipro Good hydration with G2 gatorade please STart magnesium oxide 400mg per day Keep appt with TP for now; if we need to cancel we can or change it to we can Do not hesitate to call us anytime  

## 2012-08-30 NOTE — Telephone Encounter (Signed)
Per MR:  Order STAT labs for CBCD, BMET, Blood cxs, and UA with culture.  Orders placed.  Pt's daughter aware.  MR, pt's wife concerned that pt may also need a cxr.  Pls advise if this is ok to order STAT as well.  Also, would you like to double book pt today?  TP is off.  Pt's wife is working so it is difficult for her to make multiple trips.  Pls advise.  Thank you.

## 2012-08-30 NOTE — Progress Notes (Signed)
Subjective:    Patient ID: Rodney Green, male    DOB: 1946-02-17, 67 y.o.   MRN: [id number]  HPI 67 year old male former smoker (01/2008) seen for initial PCCM consult during hospitalization on 02/26/2012 for multifocal pneumonia, complicated by severe hypoxia. Patient has a complicated history with multiple myeloma on chemotherapy, complicated by neutropenia. He is followed by Dr. Gwenyth Bouillon at the cancer Center. Past medical history includes diabetes, hypertension, hepatitis C, chronic kidney disease-baseline creatinine 1.8 1.9. And Anemia .  06/15/2012 Post Hospital follow up  Pt returns for pulmonary follow up from hospitalization in 02/2012. He was seen by PCCM due to 02/26/2012 for multifocal pneumonia, complicated by severe hypoxia.  He was admitted to ICU due to severe hypoxia, found to have a multifocal PNA . VQ scan low prob for PE .  He was treated with IV abx, o2 and nebs.  He was discharged on Abx and O2 due to persistent desats.  Unfortunately developed right DVT after discharge and is now on couamdin.  Over last couple of months has been getting slowly improved with decreased dyspnea . Wears O2 only as needed. Sats 96% on RA today .  Had some rib pain last month, cxr showed PNA clearance and no evidence of rib fx.  He does have a hx of smoking, no formal dx of COPD.  In past . No hx of asthma.  No hemoptysis, edema or orthopnea.  Does get winded with walking long distances.  Has intermittent dry cough at times. No discolored mucus.  Spirometry today >pirometry 06/15/12 >FEV1 2.26 L (85%), ratio 78    OV 08/30/2012  REcent hospitalization for deliirum related to sepsis/oipids, PIV3 with pseudomonas pna with related VDRD and enterocoiccus UTI. Pst discharge still has 1 day of cipro remaining. WAs doing well but last night had new fever, recurrence, tmax 101F . Associated fatigue + but no issues with appetite, nausea, vomit, diarrhea, rash or dysuria FEver persisted started after  MD visit to oncology yesterday and lasted through this morning but stince then no fever. Able to ADLs at hime.  LAbs show stability below with creatinine improving   Recent Labs Lab 08/29/12 1518 08/30/12 1036  HGB 7.3* 8.0 Repeated and verified X2.*  HCT 22.3* 23.9 Repeated and verified X2.*  WBC 5.0 4.2*  PLT 467* 483.0*      Recent Labs Lab 08/29/12 1518 08/30/12 1036  NA 137 136  K 4.3 4.1  CL  --  104  CO2 21* 21  GLUCOSE 67* 62*  BUN 38.6* 26*  CREATININE 1.8* 1.6*  CALCIUM 9.6 9.4  MG  --  1.6  PHOS  --  2.4   Blood and urine cutlure - sent No results found for this or any previous visit (from the past 240 hour(s)).   Dg Chest 2 View  08/30/2012   *RADIOLOGY REPORT*  Clinical Data: Fever.  Hypertension.  CHEST - 2 VIEW  Comparison: 08/21/2012  Findings: Lung volumes are low.  Allowing for this, the lungs are clear.  The heart, mediastinum and hila are unremarkable.  The bony thorax is demineralized.  There are multiple thoracic compression fractures that are stable.  IMPRESSION: No acute cardiopulmonary disease.   Original Report Authenticated By: Amie Portland, M.D.    Review of Systems  Constitutional: Positive for fever and chills. Negative for unexpected weight change.  HENT: Negative for ear pain, nosebleeds, congestion, sore throat, rhinorrhea, sneezing, trouble swallowing, dental problem, postnasal drip and sinus pressure.   Eyes: Negative  for redness and itching.  Respiratory: Positive for cough. Negative for chest tightness and wheezing.   Cardiovascular: Negative for palpitations and leg swelling.  Gastrointestinal: Negative for nausea and vomiting.  Genitourinary: Negative for dysuria.  Musculoskeletal: Negative for joint swelling.  Skin: Negative for rash.  Neurological: Positive for weakness. Negative for headaches.  Hematological: Does not bruise/bleed easily.  Psychiatric/Behavioral: Negative for dysphoric mood. The patient is not  nervous/anxious.    .meds    Objective:   Physical Exam   Filed Vitals:   08/30/12 1654  BP: 128/76  Pulse: 93  Temp: 99.5 F (37.5 C)  TempSrc: Oral  Height: 5\' 6"  (1.676 m)  Weight: 179 lb (81.194 kg)  SpO2: 94%    GEN: A/Ox3; pleasant , NAD, elderly . Sitting in wheel chair. Looks some fatigued but non-toxic  HEENT:  [place]/AT,  EACs-clear, TMs-wnl, NOSE-clear, THROAT-clear, no lesions, no postnasal drip or exudate noted.   NECK:  Supple w/ fair ROM; no JVD; normal carotid impulses w/o bruits; no thyromegaly or nodules palpated; no lymphadenopathy.  RESP  Diminished BS in bases no accessory muscle use, no dullness to percussion  CARD:  RRR, no m/r/g  ,tr  peripheral edema, pulses intact, no cyanosis or clubbing.  GI:   Soft & nt; nml bowel sounds; no organomegaly or masses detected.  Musco: Warm bil, no  joint swelling noted. SEVERE KYPHOSIS  Neuro: alert, no focal deficits noted.    Skin: Warm, no lesions or rashes      Assessment & Plan:

## 2012-08-30 NOTE — Assessment & Plan Note (Signed)
#  Fever and Fatigue Could be a viral infection passing trhough with some dehydration and low magnesium Continue and finish cipro Good hydration with G2 gatorade please STart magnesium oxide 400mg  per day Keep appt with TP for now; if we need to cancel we can or change it to we can Do not hesitate to call us anytime

## 2012-08-30 NOTE — Telephone Encounter (Signed)
Orders placed. Appt scheduled. Daughter aware.

## 2012-08-30 NOTE — Telephone Encounter (Signed)
Yes order cxr 2 view too. I can see patient in PM but double book towards later part so I do not fall behind esp if we need to admit and we give time for labs to come back. Patient can take tylenol for fever now  ORder mag and phos and LFT as well   Thanks  Dr. Kalman Shan, M.D., [facility].C.P Pulmonary and Critical Care Medicine Staff Physician [facility] Pager: [phone], If no answer or between  15:00h - 7:00h: call [phone]  08/30/2012 10:10 AM

## 2012-08-31 ENCOUNTER — Other Ambulatory Visit: Payer: Self-pay | Admitting: Medical Oncology

## 2012-08-31 ENCOUNTER — Ambulatory Visit: Payer: Self-pay | Admitting: Internal Medicine

## 2012-08-31 ENCOUNTER — Telehealth: Payer: Self-pay | Admitting: Medical Oncology

## 2012-08-31 ENCOUNTER — Other Ambulatory Visit: Payer: 59 | Admitting: Lab

## 2012-08-31 LAB — URINE CULTURE

## 2012-08-31 NOTE — Telephone Encounter (Signed)
Last OV note faxed to [facility] at [facility]

## 2012-08-31 NOTE — Telephone Encounter (Signed)
Returned call to [employer] message to call back.

## 2012-08-31 NOTE — Telephone Encounter (Signed)
Unable to reach pt to come in today for blood transfusion . Scheduled for friday

## 2012-09-01 ENCOUNTER — Encounter: Payer: Self-pay | Admitting: Adult Health

## 2012-09-01 ENCOUNTER — Ambulatory Visit (INDEPENDENT_AMBULATORY_CARE_PROVIDER_SITE_OTHER): Payer: 59 | Admitting: Adult Health

## 2012-09-01 ENCOUNTER — Telehealth: Payer: Self-pay | Admitting: Adult Health

## 2012-09-01 VITALS — BP 114/76 | HR 82 | Temp 98.8°F | Ht 65.5 in | Wt 178.4 lb

## 2012-09-01 DIAGNOSIS — J189 Pneumonia, unspecified organism: Secondary | ICD-10-CM

## 2012-09-01 DIAGNOSIS — E1129 Type 2 diabetes mellitus with other diabetic kidney complication: Secondary | ICD-10-CM

## 2012-09-01 DIAGNOSIS — N058 Unspecified nephritic syndrome with other morphologic changes: Secondary | ICD-10-CM

## 2012-09-01 NOTE — Assessment & Plan Note (Addendum)
Symptomatic Hypoglycemia with BS ranging 50-70 throughout day Will decrease Lantus dose and have him contact PCP for further evaluation  Note sent to PCP Dr. Nehemiah Settle.   Plan  ecrease Lantus 20 units At bedtime  - hold for blood sugar less 120 for now.  Will contact Dr. Nehemiah Settle for further instructions regarding blood sugars.  Avoid extreme/prolong  heat exposure.  Follow up Dr. Marchelle Gearing in 2 weeks as planned and As needed   Please contact office for sooner follow up if symptoms do not improve or worsen or seek emergency care

## 2012-09-01 NOTE — Assessment & Plan Note (Signed)
Resolved on CXR  No sign of reoccurence  Cont to follow  Consider checking PFT in future as hx of smoking .

## 2012-09-01 NOTE — Telephone Encounter (Signed)
Caitlin returned call Discussed pt's BS and TP's recs below [place] to discuss with Dr Nehemiah Settle and notify both this office and pt of recs Will route back to my inbox

## 2012-09-01 NOTE — Progress Notes (Signed)
Subjective:    Patient ID: Rodney Green, male    DOB: 14-Mar-1945, 67 y.o.   MRN: [id number]  HPI 67 year old male former smoker (01/2008) seen for initial PCCM consult during hospitalization on 02/26/2012 for multifocal pneumonia, complicated by severe hypoxia. Patient has a complicated history with multiple myeloma on chemotherapy, complicated by neutropenia. He is followed by Dr. Gwenyth Bouillon [facility] the [facility]. Past medical history includes diabetes, hypertension, hepatitis C, chronic kidney disease-baseline creatinine 1.8 1.9. And Anemia .  06/15/2012 Post Hospital follow up  Pt returns for pulmonary follow up from hospitalization in 02/2012. He was seen by PCCM due to 02/26/2012 for multifocal pneumonia, complicated by severe hypoxia.  He was admitted to ICU due to severe hypoxia, found to have a multifocal PNA . VQ scan low prob for PE .  He was treated with IV abx, o2 and nebs.  He was discharged on Abx and O2 due to persistent desats.  Unfortunately developed right DVT after discharge and is now on couamdin.  Over last couple of months has been getting slowly improved with decreased dyspnea . Wears O2 only as needed. Sats 96% on RA today .  Had some rib pain last month, cxr showed PNA clearance and no evidence of rib fx.  He does have a hx of smoking, no formal dx of COPD.  In past . No hx of asthma.  No hemoptysis, edema or orthopnea.  Does get winded with walking long distances.  Has intermittent dry cough at times. No discolored mucus.  Spirometry today >pirometry 06/15/12 >FEV1 2.26 L (85%), ratio 78    OV 08/30/2012 REcent hospitalization for deliirum related to sepsis/oipids, PIV3 with pseudomonas pna with related VDRD and enterocoiccus UTI. Pst discharge still has 1 day of cipro remaining. WAs doing well but last night had new fever, recurrence, tmax 101F . Associated fatigue + but no issues with appetite, nausea, vomit, diarrhea, rash or dysuria FEver persisted started after  MD visit to oncology yesterday and lasted through this morning but stince then no fever. Able to ADLs at hime. LAbs show stability below with creatinine improving   Recent Labs Lab 08/29/12 1518 08/30/12 1036  HGB 7.3* 8.0 Repeated and verified X2.*  HCT 22.3* 23.9 Repeated and verified X2.*  WBC 5.0 4.2*  PLT 467* 483.0*      Recent Labs Lab 08/29/12 1518 08/30/12 1036  NA 137 136  K 4.3 4.1  CL  --  104  CO2 21* 21  GLUCOSE 67* 62*  BUN 38.6* 26*  CREATININE 1.8* 1.6*  CALCIUM 9.6 9.4  MG  --  1.6  PHOS  --  2.4   Blood and urine cutlure - sent No results found for this or any previous visit (from the past 240 hour(s)).  09/01/2012 follow up  Pt was recently hospitalized for critical illness with sepsis-Psuedomonas PNA . Pt  Was seen 2 days ago for lethargy and weakness and low grade temps, CXR showed cleared infiltrates. WBC ~4.2. HBG holding steady at 8.0.  Renal fxn improved with scr at 1.6.  Urine cx neg. BC w/ ngtd.  Still feeling weak at times. Chemistry panel did show bs 62-67 .recommended he check bs this am at home and bs was 55 fasting. He ate and started feeling better.  He is on SSI in am but did not take last few days due to low sugars. Since discharge BS running lower 50-80.  He is on Lantus at 64units At bedtime .  No  n/v. Appetite is low.  Today in office he is feeling better . Just still weak. We discussed him avoiding prolonged heat exposure as he likes to go outside and sit out in heat -family worried about him.    Dg Chest 2 View  08/30/2012   *RADIOLOGY REPORT*  Clinical Data: Fever.  Hypertension.  CHEST - 2 VIEW  Comparison: 08/21/2012  Findings: Lung volumes are low.  Allowing for this, the lungs are clear.  The heart, mediastinum and hila are unremarkable.  The bony thorax is demineralized.  There are multiple thoracic compression fractures that are stable.  IMPRESSION: No acute cardiopulmonary disease.   Original Report Authenticated By: Amie Portland, M.D.    Review of Systems  Constitutional: Positive for fever   Negative for unexpected weight change.  HENT: Negative for ear pain, nosebleeds, congestion, sore throat, rhinorrhea, sneezing, trouble swallowing, dental problem, postnasal drip and sinus pressure.   Eyes: Negative for redness and itching.  Respiratory: Positive for Negative for chest tightness and wheezing.   Cardiovascular: Negative for palpitations and leg swelling.  Gastrointestinal: Negative for nausea and vomiting.  Genitourinary: Negative for dysuria.  Musculoskeletal: Negative for joint swelling.  Skin: Negative for rash.  Neurological: Positive for weakness. Negative for headaches.  Hematological: Does not bruise/bleed easily.  Psychiatric/Behavioral: Negative for dysphoric mood. The patient is not nervous/anxious.         Objective:   Physical Exam      GEN: A/Ox3; pleasant , NAD, elderly . Sitting in wheel chair. Looks some fatigued  HEENT:  /AT,  EACs-clear, TMs-wnl, NOSE-clear, THROAT-clear, no lesions, no postnasal drip or exudate noted.   NECK:  Supple w/ fair ROM; no JVD; normal carotid impulses w/o bruits; no thyromegaly or nodules palpated; no lymphadenopathy.  RESP  Diminished BS in bases no accessory muscle use, no dullness to percussion  CARD:  RRR, no m/r/g  ,tr  peripheral edema, pulses intact, no cyanosis or clubbing.  GI:   Soft & nt; nml bowel sounds; no organomegaly or masses detected.  Musco: Warm bil, no  joint swelling noted. SEVERE KYPHOSIS  Neuro: alert, no focal deficits noted.    Skin: Warm, no lesions or rashes      Assessment & Plan:

## 2012-09-01 NOTE — Patient Instructions (Addendum)
Decrease Lantus 20 units At bedtime  - hold for blood sugar less 120 for now.  Will contact Dr. Nehemiah Settle for further instructions regarding blood sugars.  Avoid extreme/prolong  heat exposure.  Follow up Dr. Marchelle Gearing in 2 weeks as planned and As needed   Please contact office for sooner follow up if symptoms do not improve or worsen or seek emergency care

## 2012-09-01 NOTE — Telephone Encounter (Signed)
BS trending low on recent labs TP adjusted pt's lantus to 20units qhs per 7.10.14 ov: Patient Instructions    Decrease Lantus 20 units At bedtime - hold for blood sugar less 120 for now.  Will contact Dr. Nehemiah Settle for further instructions regarding blood sugars.  Avoid extreme/prolong heat exposure.  Follow up Dr. Marchelle Gearing in 2 weeks as planned and As needed  Please contact office for sooner follow up if symptoms do not improve or worsen or seek emergency care   LM on Caitlin's VM to discuss and get Dr Idelle Crouch recs on pt's lantus dose going forward

## 2012-09-02 ENCOUNTER — Ambulatory Visit (HOSPITAL_BASED_OUTPATIENT_CLINIC_OR_DEPARTMENT_OTHER): Payer: 59

## 2012-09-02 VITALS — BP 126/85 | HR 74 | Temp 98.0°F | Resp 18

## 2012-09-02 DIAGNOSIS — D649 Anemia, unspecified: Secondary | ICD-10-CM

## 2012-09-02 LAB — PREPARE RBC (CROSSMATCH)

## 2012-09-02 MED ORDER — ACETAMINOPHEN 160 MG/5ML PO SOLN
650.0000 mg | Freq: Once | ORAL | Status: AC
  Administered 2012-09-02: 650 mg via ORAL
  Filled 2012-09-02: qty 20.3

## 2012-09-02 MED ORDER — DIPHENHYDRAMINE HCL 12.5 MG/5ML PO LIQD
12.5000 mg | Freq: Once | ORAL | Status: AC
  Administered 2012-09-02: 12.5 mg via ORAL
  Filled 2012-09-02: qty 5

## 2012-09-02 MED ORDER — SODIUM CHLORIDE 0.9 % IV SOLN
250.0000 mL | Freq: Once | INTRAVENOUS | Status: AC
  Administered 2012-09-02: 250 mL via INTRAVENOUS

## 2012-09-02 MED ORDER — DIPHENHYDRAMINE HCL 50 MG/ML IJ SOLN: 25.0000 mg | Freq: Once | INTRAMUSCULAR | Status: DC

## 2012-09-02 MED ORDER — ACETAMINOPHEN 325 MG PO TABS: 650.0000 mg | ORAL_TABLET | Freq: Once | ORAL | Status: DC

## 2012-09-02 NOTE — Patient Instructions (Signed)
Blood Transfusion Information WHAT IS A BLOOD TRANSFUSION? A transfusion is the replacement of blood or some of its parts. Blood is made up of multiple cells which provide different functions.  Red blood cells carry oxygen and are used for blood loss replacement.  White blood cells fight against infection.  Platelets control bleeding.  Plasma helps clot blood.  Other blood products are available for specialized needs, such as hemophilia or other clotting disorders. BEFORE THE TRANSFUSION  Who gives blood for transfusions?   You may be able to donate blood to be used at a later date on yourself (autologous donation).  Relatives can be asked to donate blood. This is generally not any safer than if you have received blood from a stranger. The same precautions are taken to ensure safety when a relative's blood is donated.  Healthy volunteers who are fully evaluated to make sure their blood is safe. This is blood bank blood. Transfusion therapy is the safest it has ever been in the practice of medicine. Before blood is taken from a donor, a complete history is taken to make sure that person has no history of diseases nor engages in risky social behavior (examples are intravenous drug use or sexual activity with multiple partners). The donor's travel history is screened to minimize risk of transmitting infections, such as malaria. The donated blood is tested for signs of infectious diseases, such as HIV and hepatitis. The blood is then tested to be sure it is compatible with you in order to minimize the chance of a transfusion reaction. If you or a relative donates blood, this is often done in anticipation of surgery and is not appropriate for emergency situations. It takes many days to process the donated blood. RISKS AND COMPLICATIONS Although transfusion therapy is very safe and saves many lives, the main dangers of transfusion include:   Getting an infectious disease.  Developing a  transfusion reaction. This is an allergic reaction to something in the blood you were given. Every precaution is taken to prevent this. The decision to have a blood transfusion has been considered carefully by your caregiver before blood is given. Blood is not given unless the benefits outweigh the risks. AFTER THE TRANSFUSION  Right after receiving a blood transfusion, you will usually feel much better and more energetic. This is especially true if your red blood cells have gotten low (anemic). The transfusion raises the level of the red blood cells which carry oxygen, and this usually causes an energy increase.  The nurse administering the transfusion will monitor you carefully for complications. HOME CARE INSTRUCTIONS  No special instructions are needed after a transfusion. You may find your energy is better. Speak with your caregiver about any limitations on activity for underlying diseases you may have. SEEK MEDICAL CARE IF:   Your condition is not improving after your transfusion.  You develop redness or irritation at the intravenous (IV) site. SEEK IMMEDIATE MEDICAL CARE IF:  Any of the following symptoms occur over the next 12 hours:  Shaking chills.  You have a temperature by mouth above 102 F (38.9 C), not controlled by medicine.  Chest, back, or muscle pain.  People around you feel you are not acting correctly or are confused.  Shortness of breath or difficulty breathing.  Dizziness and fainting.  You get a rash or develop hives.  You have a decrease in urine output.  Your urine turns a dark color or changes to pink, red, or brown. Any of the following   symptoms occur over the next 10 days:  You have a temperature by mouth above 102 F (38.9 C), not controlled by medicine.  Shortness of breath.  Weakness after normal activity.  The white part of the eye turns yellow (jaundice).  You have a decrease in the amount of urine or are urinating less often.  Your  urine turns a dark color or changes to pink, red, or brown. Document Released: 02/07/2000 Document Revised: 05/04/2011 Document Reviewed: 09/26/2007 [employer] Patient Information 2014 [employer].  

## 2012-09-03 LAB — TYPE AND SCREEN

## 2012-09-05 ENCOUNTER — Encounter: Payer: Self-pay | Admitting: Internal Medicine

## 2012-09-05 ENCOUNTER — Telehealth: Payer: Self-pay | Admitting: Internal Medicine

## 2012-09-05 ENCOUNTER — Ambulatory Visit (HOSPITAL_BASED_OUTPATIENT_CLINIC_OR_DEPARTMENT_OTHER): Payer: 59 | Admitting: Internal Medicine

## 2012-09-05 ENCOUNTER — Other Ambulatory Visit (HOSPITAL_BASED_OUTPATIENT_CLINIC_OR_DEPARTMENT_OTHER): Payer: 59 | Admitting: Lab

## 2012-09-05 VITALS — BP 138/80 | HR 74 | Temp 96.9°F | Resp 18 | Ht 65.5 in | Wt 180.0 lb

## 2012-09-05 DIAGNOSIS — C9002 Multiple myeloma in relapse: Secondary | ICD-10-CM

## 2012-09-05 LAB — COMPREHENSIVE METABOLIC PANEL (CC13)
AST: 21 U/L (ref 5–34)
Albumin: 1.6 g/dL — ABNORMAL LOW (ref 3.5–5.0)
Alkaline Phosphatase: 122 U/L (ref 40–150)
BUN: 28.8 mg/dL — ABNORMAL HIGH (ref 7.0–26.0)
Glucose: 329 mg/dl — ABNORMAL HIGH (ref 70–140)
Potassium: 4.5 mEq/L (ref 3.5–5.1)
Sodium: 135 mEq/L — ABNORMAL LOW (ref 136–145)
Total Bilirubin: 0.37 mg/dL (ref 0.20–1.20)
Total Protein: 8.2 g/dL (ref 6.4–8.3)

## 2012-09-05 LAB — CBC WITH DIFFERENTIAL/PLATELET
BASO%: 0.5 % (ref 0.0–2.0)
EOS%: 0.5 % (ref 0.0–7.0)
HCT: 26.1 % — ABNORMAL LOW (ref 38.4–49.9)
LYMPH%: 13.4 % — ABNORMAL LOW (ref 14.0–49.0)
MCH: 29.5 pg (ref 27.2–33.4)
MCHC: 33.1 g/dL (ref 32.0–36.0)
NEUT%: 68.3 % (ref 39.0–75.0)
lymph#: 0.5 10*3/uL — ABNORMAL LOW (ref 0.9–3.3)

## 2012-09-05 NOTE — Telephone Encounter (Signed)
Gave pt appt for lab before  MD at the end of  July 2014

## 2012-09-05 NOTE — Patient Instructions (Signed)
We will continue to hold her chemotherapy for now. Repeat myeloma panel and follow up visit in 2 weeks

## 2012-09-05 NOTE — Progress Notes (Signed)
[facility] [phone]   [phone]   OFFICE PROGRESS NOTE  Katy Apo, MD [address] [place] [address]  DIAGNOSIS:  Multiple myeloma IgA subtype diagnosed in November, 2009.   PRIOR THERAPY:  1. Status post seven cycles of systemic chemotherapy with Revlimid and low-dose Betatron with partial response. The last dose was given June, 2010. 2. Status post chemotherapy with weekly Velcade in a patient on Decadron 40 mg on a weekly basis, status post 75 weekly doses. The last dose was given May 04, 2008. 3. Status post treatment again with Velcade and Decadron. The patient was awaiting stem cell transplant at [facility]. Last dose was given Jul 16, 2009. 4. The patient was not considered a good candidate for autologous stem cell transplant at that time secondary to poor mobilization of his stem cells.  5. Status post six cycles of systemic chemotherapy with Velcade, Cytoxan and Betatron. Last dose was given March 25, 2010. This continued secondary to his disease progression. 6. Status post ten cycles of systemic chemotherapy with Velcade. Doxil and Decadron. Last dose was given on 10/31/2010. Currently on hold because of concern about cardiac toxicity.  CURRENT THERAPY:  1. Pomalyst 4 mg by mouth daily for 21 days every 28 days, last dose was given 3 weeks ago. 2. Decadron 40 mg by mouth weekly   INTERVAL HISTORY: Rodney Green 67 y.o. male returns to the clinic today for follow up visit accompanied by his wife. The patient continues to complain of increasing fatigue and weakness. He received 2 units of PRBCs transfusion recently with some improvement in his condition. He also continues to complain of intermittent low-grade fever, but no chills. He denied having any significant chest pain, shortness of breath but continues to have mild cough with no hemoptysis. He completed a course of antibiotic with  Cipro. He is here today for evaluation before starting his treatment with Pomalyst.  MEDICAL HISTORY: Past Medical History  Diagnosis Date  . Allergy   . Diabetes mellitus   . Hypertension   . Hepatitis C   . Multiple myeloma   . Multiple myeloma   . Chronic kidney disease   . Shortness of breath     ALLERGIES:  is allergic to ace inhibitors.  MEDICATIONS:  Current Outpatient Prescriptions  Medication Sig Dispense Refill  . amLODipine (NORVASC) 5 MG tablet Take 5 mg by mouth daily.        Marland Kitchen aspirin 81 MG tablet Take 81 mg by mouth daily.      Marland Kitchen atenolol (TENORMIN) 25 MG tablet Take 25 mg by mouth daily.      . bisacodyl (BISACODYL) 5 MG EC tablet 3 tabs every other day      . dexamethasone (DECADRON) 4 MG tablet TAKE 10 TABLETS (40 MG) ONCE WEEKLY  130 tablet  0  . diphenhydrAMINE (SOMINEX) 25 MG tablet Take 25 mg by mouth at bedtime.      . docusate sodium (COLACE) 100 MG capsule Take 2 capsules by mouth twice daily      . filgrastim (NEUPOGEN) 480 MCG/0.8ML SOLN injection Inject 0.8 mLs (480 mcg total) into the skin as directed.  4 Syringe  1  . HUMALOG 100 UNIT/ML injection Sliding scale      . HYDROmorphone (DILAUDID) 4 MG tablet Take 4 mg by mouth every 4 (four) hours as needed for pain.      Marland Kitchen insulin glargine (  LANTUS) 100 UNIT/ML injection Inject 64 Units into the skin at bedtime.       . mineral oil external liquid Every other day      . oxycodone (OXY-IR) 5 MG capsule Take 1 capsule (5 mg total) by mouth every 4 (four) hours as needed.  30 capsule  0  . oxyCODONE (OXYCONTIN) 10 MG 12 hr tablet Take 1 tablet (10 mg total) by mouth every 12 (twelve) hours.  60 tablet  0  . senna (SENOKOT) 8.6 MG tablet Take 3 tablets by mouth every other day.       . simvastatin (ZOCOR) 20 MG tablet Take 20 mg by mouth every evening.      . TRAVATAN Z 0.004 % SOLN ophthalmic solution Place 1 drop into both eyes at bedtime.      . valACYclovir (VALTREX) 500 MG tablet Take 1 tablet (500 mg  total) by mouth 2 (two) times daily.  14 tablet  1   No current facility-administered medications for this visit.    SURGICAL HISTORY:  Past Surgical History  Procedure Laterality Date  . Circumcision    . Hand surgery    . Abdominal abcess      REVIEW OF SYSTEMS:  A comprehensive review of systems was negative except for: Constitutional: positive for fatigue, fevers and weight loss Respiratory: positive for dyspnea on exertion Musculoskeletal: positive for muscle weakness   PHYSICAL EXAMINATION: General appearance: alert, cooperative, fatigued and no distress Head: Normocephalic, without obvious abnormality, atraumatic Neck: no adenopathy Lymph nodes: Cervical, supraclavicular, and axillary nodes normal. Resp: clear to auscultation bilaterally Cardio: regular rate and rhythm, S1, S2 normal, no murmur, click, rub or gallop GI: soft, non-tender; bowel sounds normal; no masses,  no organomegaly Extremities: extremities normal, atraumatic, no cyanosis or edema Neurologic: Alert and oriented X 3, normal strength and tone. Normal symmetric reflexes. Normal coordination and gait  ECOG PERFORMANCE STATUS: 1 - Symptomatic but completely ambulatory  Blood pressure 138/80, pulse 74, temperature 96.9 F (36.1 C), temperature source Oral, resp. rate 18, height 5' 5.5" (1.664 m), weight 180 lb (81.647 kg).  LABORATORY DATA: Lab Results  Component Value Date   WBC 3.5* 09/05/2012   HGB 8.6* 09/05/2012   HCT 26.1* 09/05/2012   MCV 89.2 09/05/2012   PLT 477* 09/05/2012      Chemistry      Component Value Date/Time   NA 135* 09/05/2012 1334   NA 136 08/30/2012 1036   K 4.5 09/05/2012 1334   K 4.1 08/30/2012 1036   CL 104 08/30/2012 1036   CL 101 07/14/2012 1010   CO2 23 09/05/2012 1334   CO2 21 08/30/2012 1036   BUN 28.8* 09/05/2012 1334   BUN 26* 08/30/2012 1036   CREATININE 2.5* 09/05/2012 1334   CREATININE 1.6* 08/30/2012 1036   GLU 293* 08/12/2009 1322      Component Value Date/Time   CALCIUM  10.2 09/05/2012 1334   CALCIUM 9.4 08/30/2012 1036   ALKPHOS 122 09/05/2012 1334   ALKPHOS 73 08/30/2012 1036   AST 21 09/05/2012 1334   AST 18 08/30/2012 1036   ALT 14 09/05/2012 1334   ALT 15 08/30/2012 1036   BILITOT 0.37 09/05/2012 1334   BILITOT 0.5 08/30/2012 1036       RADIOGRAPHIC STUDIES: Dg Chest 2 View  08/30/2012   *RADIOLOGY REPORT*  Clinical Data: Fever.  Hypertension.  CHEST - 2 VIEW  Comparison: 08/21/2012  Findings: Lung volumes are low.  Allowing for this, the lungs  are clear.  The heart, mediastinum and hila are unremarkable.  The bony thorax is demineralized.  There are multiple thoracic compression fractures that are stable.  IMPRESSION: No acute cardiopulmonary disease.   Original Report Authenticated By: Amie Portland, M.D.   Dg Chest 2 View  08/21/2012   *RADIOLOGY REPORT*  Clinical Data: Multiple myeloma.  Follow up of pseudomonas pneumonia, CHF.  CHEST - 2 VIEW  Comparison: 07/25/2012.  Findings: The interstitium has cleared.  No edema.  No focal airspace abnormalities.  The heart, mediastinum and hilar contours remain normal.  There are no effusions or pneumothoraces.  No change in position of left IJ central catheter.  No bony abnormalities.  IMPRESSION: Interval clearing of the interstitium.  No focal abnormalities.   Original Report Authenticated By: Sander Radon, M.D.   Ct Chest Wo Contrast  08/15/2012   *RADIOLOGY REPORT*  Clinical Data: Neutropenic.  Myeloma.  Sepsis.  Lung injury. Cough.  Shortness of breath.  CT CHEST WITHOUT CONTRAST  Technique:  Multidetector CT imaging of the chest was performed following the standard protocol without IV contrast.  Comparison: Plain films earlier today.  Most recent CT of 02/25/2012  Findings: Lungs/pleura: Moderate motion degradation throughout. No lobar consolidation.  Improved aeration since the prior exam. Residual thickening along the peribronchovascular interstitium, including in the left upper lobe on image 24/series 5.  This is  nonspecific.  No pleural fluid.  Heart/Mediastinum: Tortuous thoracic aorta.  Mild cardiomegaly with dense coronary artery atherosclerosis. No pericardial effusion. Pulmonary artery enlargement, with outflow tract measuring 3.8 cm. No mediastinal or definite hilar adenopathy, given limitations of unenhanced CT.  Upper abdomen: Motion degradation continuing into the upper abdomen.  Bones/Musculoskeletal:  Lipoma within the right supraspinatus musculature of 2.7 cm.  Progression of markedly heterogeneous marrow density.  Expansile anterior left seventh rib lesion on image 45/series 2 is new or progressive.  Healing 11th and 12th posterior right rib fractures  Vertebral body height loss at multiple levels with prior vertebral augmentation at L1.  IMPRESSION:  1.  Moderate motion degraded exam.  Overall improved aeration since the prior CT with suspicion of residual nonspecific peribronchovascular interstitial thickening. 2.  Cardiomegaly with advanced coronary artery atherosclerosis. 3. Pulmonary artery enlargement suggests pulmonary arterial hypertension. 4.  Progression of osseous findings most consistent with the clinical history of myeloma.   Original Report Authenticated By: Jeronimo Greaves, M.D.   US Renal  08/16/2012   *RADIOLOGY REPORT*  Clinical Data: Acute superimposed upon chronic renal failure. Current history of multiple myeloma, hypertension, diabetes, and hepatitis C.  RENAL/URINARY TRACT ULTRASOUND COMPLETE  Comparison:  Visualized kidneys on lumbar spine MRI 03/29/2008.  Findings:  Right Kidney:  Echogenic parenchyma consistent with medical renal disease.  Numerous cysts as noted on the prior MR, the largest arising from the mid kidney measuring approximately 3.9 x 4.0 x 2.8 cm, containing a thick septation and internal echoes.  No solid parenchymal masses.  No hydronephrosis.  Approximately 13.2 cm in length.  Left Kidney:  Echogenic parenchyma consistent with medical renal disease.  Numerous cysts as  noted on the prior MR, the largest arising from the lower pole measuring approximate 2.1 x 1.4 x 1.5 cm.  No solid parenchymal masses.  No hydronephrosis. Approximately 12.7 cm in length.  Bladder:  Decompressed by Foley catheter.  IMPRESSION:  1.  No evidence of hydronephrosis involving either kidney. 2.  Echogenic renal parenchyma bilaterally consistent with medical renal disease. 3.  Numerous bilateral renal cysts, the largest approximating 4  cm in the mid right kidney.  This largest cyst has a thick internal septation and internal echoes.  Ultrasound follow-up of this cyst in 6 months may be helpful to confirm stability.   Original Report Authenticated By: Hulan Saas, M.D.   Dg Chest Port 1 View  08/18/2012   *RADIOLOGY REPORT*  Clinical Data:  Pseudomonas pneumonia, shortness of breath, weakness, hypertension, diabetes, para influenza, evaluate for airspace disease  PORTABLE CHEST - 1 VIEW  Comparison: Portable exam 0958 hours compared to 08/16/2012  Findings: Left jugular central venous catheter tip projects over SVC. Endotracheal tube no longer identified. Enlargement of cardiac silhouette with pulmonary vascular congestion. Low lung volumes with bibasilar atelectasis. No gross consolidation, pleural effusion or pneumothorax.  IMPRESSION: Enlargement of cardiac silhouette with pulmonary vascular congestion. Low lung volumes with bibasilar atelectasis.   Original Report Authenticated By: Ulyses Southward, M.D.   Dg Chest Port 1 View  08/16/2012   *RADIOLOGY REPORT*  Clinical Data: ET tube placement.  PORTABLE CHEST - 1 VIEW  Comparison: 08/15/2012  Findings: Support devices remain in place, unchanged.  Improving bibasilar atelectasis.  Heart is mildly enlarged.  Low lung volumes, slightly improved since prior study.  No significant effusion.  No acute bony abnormality.  IMPRESSION: Improving aeration and bibasilar atelectasis.   Original Report Authenticated By: Charlett Nose, M.D.   Dg Chest Port 1  View  08/15/2012   *RADIOLOGY REPORT*  Clinical Data: Respiratory failure, endotracheal tube and central line placement  PORTABLE CHEST - 1 VIEW  Comparison: Portable chest x-ray of 08/15/2012 and CT chest of the same date  Findings: The tip of the endotracheal tube is approximately 5.0 cm above the carina.  The lungs are poorly aerated and there is more opacity at the bases right much greater than left.  These opacities most likely are due to atelectasis with some elevation of the right hemidiaphragm.  Cardiomegaly is stable.  A left central venous line is present with the tip overlying the upper SVC.  No pneumothorax is seen.  IMPRESSION:  1.  Poor inspiration with basilar opacities right greater than left most consistent with atelectasis. 2.  Tip of endotracheal tube 5.0 cm above the carina. 3.  Left IJ central venous line tip overlies the region of the upper SVC.   Original Report Authenticated By: Dwyane Dee, M.D.   Dg Chest Port 1 View  08/15/2012   *RADIOLOGY REPORT*  Clinical Data: Pneumonia.  Increasing shortness of breath.  PORTABLE CHEST - 1 VIEW  Comparison: 08/14/2012  Findings: Low lung volumes.  Mild vascular congestion and continued interstitial prominence could reflect interstitial edema.  No visible effusions.  No real change since prior study.  No acute bony abnormality.  IMPRESSION: Continued low lung volumes, vascular congestion and interstitial prominence.  No change.   Original Report Authenticated By: Charlett Nose, M.D.   Dg Chest Port 1 View  08/14/2012   *RADIOLOGY REPORT*  Clinical Data: Shortness of breath.  PORTABLE CHEST - 1 VIEW  Comparison: 05/19/2012  Findings: Lung volumes are low bilaterally.  Compared to the prior study, there is a definite increase in pulmonary vascularity suggestive of interstitial edema/CHF.  Heart size is normal.  No significant pleural fluid is identified.  IMPRESSION: Increase in pulmonary vascularity suggestive of interstitial edema/CHF.   Original  Report Authenticated By: Irish Lack, M.D.    ASSESSMENT AND PLAN: this is a very pleasant 67 years old African American male with recurrent multiple myeloma recently treated with Pomalyst and Decadron  but this treatment has been on hold for the last 3 weeks secondary to her recent hospitalization with septic shock and Pseudomonas pneumonia. He has not completely recovered from his recent hospitalization with persistent fatigue and low-grade fever. I recommended for the patient to have repeat myeloma panel and I would see him back for follow up visit in 2 weeks for evaluation before resuming her systemic therapy with Pomalyst and Decadron He was advised to call immediately if he has any concerning symptoms in the interval.   All questions were answered. The patient knows to call the clinic with any problems, questions or concerns. We can certainly see the patient much sooner if necessary.  I spent 15 minutes counseling the patient face to face. The total time spent in the appointment was 25 minutes.

## 2012-09-06 ENCOUNTER — Inpatient Hospital Stay (HOSPITAL_COMMUNITY)
Admission: EM | Admit: 2012-09-06 | Discharge: 2012-09-20 | DRG: 071 | Disposition: A | Discharge status: Hospice | Payer: 59 | Attending: Internal Medicine | Admitting: Internal Medicine

## 2012-09-06 ENCOUNTER — Emergency Department (HOSPITAL_COMMUNITY): Payer: 59

## 2012-09-06 ENCOUNTER — Encounter (HOSPITAL_COMMUNITY): Payer: Self-pay

## 2012-09-06 DIAGNOSIS — D649 Anemia, unspecified: Secondary | ICD-10-CM

## 2012-09-06 DIAGNOSIS — J189 Pneumonia, unspecified organism: Secondary | ICD-10-CM

## 2012-09-06 DIAGNOSIS — Z7982 Long term (current) use of aspirin: Secondary | ICD-10-CM

## 2012-09-06 DIAGNOSIS — B1920 Unspecified viral hepatitis C without hepatic coma: Secondary | ICD-10-CM | POA: Diagnosis present

## 2012-09-06 DIAGNOSIS — E876 Hypokalemia: Secondary | ICD-10-CM | POA: Diagnosis not present

## 2012-09-06 DIAGNOSIS — R32 Unspecified urinary incontinence: Secondary | ICD-10-CM | POA: Diagnosis not present

## 2012-09-06 DIAGNOSIS — D849 Immunodeficiency, unspecified: Secondary | ICD-10-CM

## 2012-09-06 DIAGNOSIS — I10 Essential (primary) hypertension: Secondary | ICD-10-CM | POA: Diagnosis present

## 2012-09-06 DIAGNOSIS — R5381 Other malaise: Secondary | ICD-10-CM | POA: Diagnosis present

## 2012-09-06 DIAGNOSIS — R0602 Shortness of breath: Secondary | ICD-10-CM

## 2012-09-06 DIAGNOSIS — C9002 Multiple myeloma in relapse: Secondary | ICD-10-CM

## 2012-09-06 DIAGNOSIS — I129 Hypertensive chronic kidney disease with stage 1 through stage 4 chronic kidney disease, or unspecified chronic kidney disease: Secondary | ICD-10-CM | POA: Diagnosis present

## 2012-09-06 DIAGNOSIS — R509 Fever, unspecified: Secondary | ICD-10-CM

## 2012-09-06 DIAGNOSIS — R599 Enlarged lymph nodes, unspecified: Secondary | ICD-10-CM | POA: Diagnosis present

## 2012-09-06 DIAGNOSIS — N183 Chronic kidney disease, stage 3 unspecified: Secondary | ICD-10-CM

## 2012-09-06 DIAGNOSIS — Z66 Do not resuscitate: Secondary | ICD-10-CM | POA: Diagnosis not present

## 2012-09-06 DIAGNOSIS — Z794 Long term (current) use of insulin: Secondary | ICD-10-CM

## 2012-09-06 DIAGNOSIS — Z87891 Personal history of nicotine dependence: Secondary | ICD-10-CM

## 2012-09-06 DIAGNOSIS — D899 Disorder involving the immune mechanism, unspecified: Secondary | ICD-10-CM | POA: Diagnosis present

## 2012-09-06 DIAGNOSIS — G9340 Encephalopathy, unspecified: Principal | ICD-10-CM

## 2012-09-06 DIAGNOSIS — E118 Type 2 diabetes mellitus with unspecified complications: Secondary | ICD-10-CM | POA: Diagnosis present

## 2012-09-06 DIAGNOSIS — Z79899 Other long term (current) drug therapy: Secondary | ICD-10-CM

## 2012-09-06 DIAGNOSIS — R259 Unspecified abnormal involuntary movements: Secondary | ICD-10-CM | POA: Diagnosis present

## 2012-09-06 DIAGNOSIS — D709 Neutropenia, unspecified: Secondary | ICD-10-CM | POA: Diagnosis present

## 2012-09-06 DIAGNOSIS — F411 Generalized anxiety disorder: Secondary | ICD-10-CM | POA: Diagnosis present

## 2012-09-06 DIAGNOSIS — R609 Edema, unspecified: Secondary | ICD-10-CM | POA: Diagnosis present

## 2012-09-06 DIAGNOSIS — E1129 Type 2 diabetes mellitus with other diabetic kidney complication: Secondary | ICD-10-CM

## 2012-09-06 DIAGNOSIS — Z8701 Personal history of pneumonia (recurrent): Secondary | ICD-10-CM

## 2012-09-06 DIAGNOSIS — Z515 Encounter for palliative care: Secondary | ICD-10-CM

## 2012-09-06 DIAGNOSIS — B259 Cytomegaloviral disease, unspecified: Secondary | ICD-10-CM

## 2012-09-06 DIAGNOSIS — R4182 Altered mental status, unspecified: Secondary | ICD-10-CM

## 2012-09-06 LAB — COMPREHENSIVE METABOLIC PANEL
AST: 13 U/L (ref 0–37)
Albumin: 1.8 g/dL — ABNORMAL LOW (ref 3.5–5.2)
Alkaline Phosphatase: 117 U/L (ref 39–117)
Chloride: 95 mEq/L — ABNORMAL LOW (ref 96–112)
Creatinine, Ser: 1.97 mg/dL — ABNORMAL HIGH (ref 0.50–1.35)
Potassium: 4.8 mEq/L (ref 3.5–5.1)
Total Bilirubin: 0.3 mg/dL (ref 0.3–1.2)

## 2012-09-06 LAB — URINALYSIS, ROUTINE W REFLEX MICROSCOPIC
Glucose, UA: 250 mg/dL — AB
Protein, ur: >300 mg/dL — AB
pH: 5 (ref 5.0–8.0)

## 2012-09-06 LAB — CBC WITH DIFFERENTIAL/PLATELET
Basophils Absolute: 0 10*3/uL (ref 0.0–0.1)
Lymphocytes Relative: 12 % (ref 12–46)
Monocytes Relative: 13 % — ABNORMAL HIGH (ref 3–12)
Neutrophils Relative %: 74 % (ref 43–77)
Platelets: 520 10*3/uL — ABNORMAL HIGH (ref 150–400)
RDW: 15.5 % (ref 11.5–15.5)
WBC: 5.4 10*3/uL (ref 4.0–10.5)

## 2012-09-06 LAB — URINE MICROSCOPIC-ADD ON

## 2012-09-06 MED ORDER — SODIUM CHLORIDE 0.9 % IV BOLUS (SEPSIS)
500.0000 mL | Freq: Once | INTRAVENOUS | Status: AC
  Administered 2012-09-06: 500 mL via INTRAVENOUS

## 2012-09-06 NOTE — ED Notes (Signed)
[id number]<AV> Expected date:<BR> Expected time:<BR> Means of arrival:<BR> Comments:<BR> EMS/67 yo with altered mental status

## 2012-09-06 NOTE — Telephone Encounter (Signed)
Never heard back from Dr [facility] office Called spoke with pt's spouse who reported that they were told to continue the Lantus at 20 units at bedtime and to check pt's BS several times throughout the weekend.  Spouse aware to call the office if symptoms do not improve or worsen.  Will sign off.

## 2012-09-06 NOTE — ED Notes (Signed)
Pts wife came home and found him under several blankets and slightly altered

## 2012-09-06 NOTE — ED Notes (Signed)
Pt states that he feels fine and nothing is wrong with him, he had a visit with Dr Gwenyth Bouillon yesterday at the [facility] and had a round of chemo, was sent home.

## 2012-09-06 NOTE — ED Provider Notes (Signed)
History    CSN: [id number] Arrival date & time 09/06/12  2050  First MD Initiated Contact with Patient 09/06/12 2054     Chief Complaint  Patient presents with  . Altered Mental Status  . Fever   (Consider location/radiation/quality/duration/timing/severity/associated sxs/prior Treatment) HPI Comments: 67 yo male with MM, hep C, DM presents with change in behavior and recurrent fevers. His wife has wanted him to be evaluated the past week however he refused to EMS at one point.  His awareness varies at home when she comes from work, at times not knowing details he normally should.  Similar to hx where he was admitted for pneumonia/ sepsis/ UTI.  Tylenol improves temp then returns.  No current abx.  Last chemo prior to hospitalization.  Dr Sharyn Lull pcp.  Dr Gwenyth Bouillon oncology.    Patient is a 67 y.o. male presenting with altered mental status and fever. The history is provided by the patient, the spouse and medical records.  Altered Mental Status Presenting symptoms: confusion   Associated symptoms: fever   Associated symptoms: no abdominal pain, no headaches, no light-headedness, no rash and no vomiting   Fever Associated symptoms: confusion   Associated symptoms: no chest pain, no chills, no dysuria, no headaches, no rash and no vomiting    Past Medical History  Diagnosis Date  . Allergy   . Diabetes mellitus   . Hypertension   . Hepatitis C   . Multiple myeloma   . Multiple myeloma   . Chronic kidney disease   . Shortness of breath    Past Surgical History  Procedure Laterality Date  . Circumcision    . Hand surgery    . Abdominal abcess     Family History  Problem Relation Age of Onset  . Cancer Sister   . Diabetes Other   . Hypertension Other    History  Substance Use Topics  . Smoking status: Former Smoker -- 0.50 packs/day for 44 years    Types: Cigarettes    Quit date: 01/24/2008  . Smokeless tobacco: Never Used  . Alcohol Use: No     Comment: occ     Review of Systems  Constitutional: Positive for fever. Negative for chills and appetite change.  HENT: Negative for neck pain.   Eyes: Negative for visual disturbance.  Respiratory: Negative for shortness of breath.   Cardiovascular: Negative for chest pain.  Gastrointestinal: Negative for vomiting and abdominal pain.  Genitourinary: Negative for dysuria.  Musculoskeletal: Negative for back pain.  Skin: Negative for rash.  Neurological: Negative for light-headedness and headaches.  Psychiatric/Behavioral: Positive for confusion and altered mental status.    Allergies  Ace inhibitors  Home Medications   Current Outpatient Rx  Name  Route  Sig  Dispense  Refill  . amLODipine (NORVASC) 5 MG tablet   Oral   Take 5 mg by mouth daily.           Marland Kitchen aspirin 81 MG tablet   Oral   Take 81 mg by mouth daily.         Marland Kitchen atenolol (TENORMIN) 25 MG tablet   Oral   Take 25 mg by mouth daily.         . bisacodyl (BISACODYL) 5 MG EC tablet   Oral   Take 15 mg by mouth every other day.          Marland Kitchen dexamethasone (DECADRON) 4 MG tablet      TAKE 10 TABLETS (40 MG) ONCE  WEEKLY   130 tablet   0   . diphenhydrAMINE (SOMINEX) 25 MG tablet   Oral   Take 25 mg by mouth at bedtime.         . docusate sodium (COLACE) 100 MG capsule      Take 2 capsules by mouth twice daily         . filgrastim (NEUPOGEN) 480 MCG/0.8ML SOLN injection   Subcutaneous   Inject 0.8 mLs (480 mcg total) into the skin as directed.   4 Syringe   1   . HUMALOG 100 UNIT/ML injection   Subcutaneous   Inject into the skin 3 (three) times daily before meals. Sliding scale         . insulin glargine (LANTUS) 100 UNIT/ML injection   Subcutaneous   Inject 64 Units into the skin at bedtime.          . mineral oil external liquid   Topical   Apply 1 application topically every other day. Every other day apply to stomach         . oxycodone (OXY-IR) 5 MG capsule   Oral   Take 5 mg by mouth  every 4 (four) hours as needed.         Marland Kitchen oxyCODONE (OXYCONTIN) 10 MG 12 hr tablet   Oral   Take 1 tablet (10 mg total) by mouth every 12 (twelve) hours.   60 tablet   0   . POMALYST 4 MG capsule   Oral   Take 4 mg by mouth daily.          . simvastatin (ZOCOR) 20 MG tablet   Oral   Take 20 mg by mouth every evening.         . TRAVATAN Z 0.004 % SOLN ophthalmic solution   Both Eyes   Place 1 drop into both eyes at bedtime.         . valACYclovir (VALTREX) 500 MG tablet   Oral   Take 1 tablet (500 mg total) by mouth 2 (two) times daily.   14 tablet   1    BP 134/83  Pulse 100  Temp(Src) 99.8 F (37.7 C) (Oral)  Resp 18  SpO2 95% Physical Exam  Nursing note and vitals reviewed. Constitutional: He is oriented to person, place, and time. He appears well-developed and well-nourished.  HENT:  Head: Normocephalic and atraumatic.  Mild dry mm  Eyes: Conjunctivae are normal. Right eye exhibits no discharge. Left eye exhibits no discharge.  Neck: Normal range of motion. Neck supple. No tracheal deviation present.  Cardiovascular: Normal rate and regular rhythm.   Pulmonary/Chest: Effort normal. No respiratory distress. He has no wheezes. He has no rales.  Abdominal: Soft. He exhibits no distension. There is no tenderness. There is no guarding.  Musculoskeletal: He exhibits no edema.  Neurological: He is alert and oriented to person, place, and time. He has normal strength. No cranial nerve deficit. Coordination normal. GCS eye subscore is 4. GCS verbal subscore is 5. GCS motor subscore is 6.  AO3 Pt answers most questions appropriate with flat affect, he does not know why he is in ED.    Skin: Skin is warm. No rash noted.  Psychiatric: He has a normal mood and affect.    ED Course  Procedures (including critical care time) Labs Reviewed  CBC WITH DIFFERENTIAL - Abnormal; Notable for the following:    RBC 3.07 (*)    Hemoglobin 8.5 (*)    HCT  27.2 (*)     Platelets 520 (*)    Monocytes Relative 13 (*)    Lymphs Abs 0.6 (*)    All other components within normal limits  COMPREHENSIVE METABOLIC PANEL - Abnormal; Notable for the following:    Sodium 130 (*)    Chloride 95 (*)    Glucose, Bld 153 (*)    BUN 34 (*)    Creatinine, Ser 1.97 (*)    Albumin 1.8 (*)    GFR calc non Af Amer 34 (*)    GFR calc Af Amer 40 (*)    All other components within normal limits  URINALYSIS, ROUTINE W REFLEX MICROSCOPIC - Abnormal; Notable for the following:    APPearance CLOUDY (*)    Specific Gravity, Urine 1.034 (*)    Glucose, UA 250 (*)    Hgb urine dipstick LARGE (*)    Protein, ur >300 (*)    All other components within normal limits  URINE MICROSCOPIC-ADD ON - Abnormal; Notable for the following:    Casts HYALINE CASTS (*)    All other components within normal limits  URINE CULTURE  CULTURE, BLOOD (ROUTINE X 2)  CULTURE, BLOOD (ROUTINE X 2)  LACTIC ACID, PLASMA   Dg Chest 2 View  09/06/2012   *RADIOLOGY REPORT*  Clinical Data: Fever.  Recent pneumonia.  CHEST - 2 VIEW  Comparison: 08/30/2012.  Findings: Compression fractures of multiple lower thoracic and upper lumbar vertebrae, causing exaggerated kyphosis.  Status post bone augmentation in the upper lumbar spine, likely L1 and L2.  No appreciable change from prior, with sensitivity decreased by advanced osteopenia.  Low lung volumes with interstitial crowding.  No definitive lobar consolidation, effusion, pneumothorax.  No change in mediastinal size contours.  IMPRESSION: No definite change from prior to suggest acute cardiopulmonary disease.  Very low lung volumes decreases sensitivity for detecting pneumonia.   Original Report Authenticated By: Tiburcio Pea   No diagnosis found.  MDM  Recent complex medical hx, reviewed recent charts. No obvious infectious source at this time however with patient worsening recently plan for observation. Paged hospitalist.   Dr Toniann Fail accepted for full  admit. Cultures and cefepime  Rechecked, pt comfortable, no acute change, supple neck, no meningismus. Admitted  Dx altered mental state, fevers, anemia  Enid Skeens, MD 09/07/12 [phone]

## 2012-09-07 ENCOUNTER — Inpatient Hospital Stay (HOSPITAL_COMMUNITY): Payer: 59

## 2012-09-07 ENCOUNTER — Encounter (HOSPITAL_COMMUNITY): Payer: Self-pay | Admitting: Internal Medicine

## 2012-09-07 DIAGNOSIS — R0602 Shortness of breath: Secondary | ICD-10-CM

## 2012-09-07 DIAGNOSIS — E1129 Type 2 diabetes mellitus with other diabetic kidney complication: Secondary | ICD-10-CM

## 2012-09-07 DIAGNOSIS — R509 Fever, unspecified: Secondary | ICD-10-CM | POA: Diagnosis present

## 2012-09-07 DIAGNOSIS — C9002 Multiple myeloma in relapse: Secondary | ICD-10-CM

## 2012-09-07 DIAGNOSIS — M7989 Other specified soft tissue disorders: Secondary | ICD-10-CM

## 2012-09-07 DIAGNOSIS — G9340 Encephalopathy, unspecified: Secondary | ICD-10-CM | POA: Diagnosis present

## 2012-09-07 DIAGNOSIS — N058 Unspecified nephritic syndrome with other morphologic changes: Secondary | ICD-10-CM

## 2012-09-07 LAB — CBC WITH DIFFERENTIAL/PLATELET
Basophils Relative: 0 % (ref 0–1)
Eosinophils Relative: 1 % (ref 0–5)
HCT: 25.3 % — ABNORMAL LOW (ref 39.0–52.0)
Hemoglobin: 8.2 g/dL — ABNORMAL LOW (ref 13.0–17.0)
MCH: 28.4 pg (ref 26.0–34.0)
MCHC: 32.4 g/dL (ref 30.0–36.0)
MCV: 87.5 fL (ref 78.0–100.0)
Monocytes Absolute: 0.7 10*3/uL (ref 0.1–1.0)
Monocytes Relative: 18 % — ABNORMAL HIGH (ref 3–12)
Neutro Abs: 2.4 10*3/uL (ref 1.7–7.7)

## 2012-09-07 LAB — BASIC METABOLIC PANEL
BUN: 31 mg/dL — ABNORMAL HIGH (ref 6–23)
CO2: 20 mEq/L (ref 19–32)
Chloride: 101 mEq/L (ref 96–112)
GFR calc Af Amer: 40 mL/min — ABNORMAL LOW (ref >90)
Glucose, Bld: 184 mg/dL — ABNORMAL HIGH (ref 70–99)
Potassium: 4.1 mEq/L (ref 3.5–5.1)

## 2012-09-07 LAB — HEPATIC FUNCTION PANEL
ALT: 8 U/L (ref 0–53)
AST: 11 U/L (ref 0–37)
Albumin: 1.6 g/dL — ABNORMAL LOW (ref 3.5–5.2)
Total Protein: 7.4 g/dL (ref 6.0–8.3)

## 2012-09-07 LAB — GLUCOSE, CAPILLARY: Glucose-Capillary: 238 mg/dL — ABNORMAL HIGH (ref 70–99)

## 2012-09-07 LAB — RAPID URINE DRUG SCREEN, HOSP PERFORMED
Amphetamines: NOT DETECTED
Benzodiazepines: NOT DETECTED
Cocaine: NOT DETECTED
Opiates: NOT DETECTED

## 2012-09-07 LAB — AMMONIA
Ammonia: 34 umol/L (ref 11–60)
Ammonia: 41 umol/L (ref 11–60)

## 2012-09-07 MED ORDER — SODIUM CHLORIDE 0.9 % IJ SOLN
3.0000 mL | Freq: Two times a day (BID) | INTRAMUSCULAR | Status: DC
  Administered 2012-09-07 – 2012-09-19 (×14): 3 mL via INTRAVENOUS

## 2012-09-07 MED ORDER — ENOXAPARIN SODIUM 40 MG/0.4ML ~~LOC~~ SOLN
40.0000 mg | SUBCUTANEOUS | Status: DC
  Administered 2012-09-07 – 2012-09-19 (×13): 40 mg via SUBCUTANEOUS
  Filled 2012-09-07 (×14): qty 0.4

## 2012-09-07 MED ORDER — OXYCODONE HCL ER 10 MG PO T12A
10.0000 mg | EXTENDED_RELEASE_TABLET | Freq: Two times a day (BID) | ORAL | Status: DC
  Administered 2012-09-07 – 2012-09-20 (×24): 10 mg via ORAL
  Filled 2012-09-07 (×25): qty 1

## 2012-09-07 MED ORDER — MINERAL OIL PO OIL
10.0000 mL | TOPICAL_OIL | ORAL | Status: DC
  Filled 2012-09-07 (×6): qty 30

## 2012-09-07 MED ORDER — BISACODYL 5 MG PO TBEC
15.0000 mg | DELAYED_RELEASE_TABLET | ORAL | Status: DC
  Administered 2012-09-09 – 2012-09-17 (×5): 15 mg via ORAL
  Filled 2012-09-07 (×2): qty 3
  Filled 2012-09-07: qty 2
  Filled 2012-09-07 (×2): qty 3
  Filled 2012-09-07: qty 1
  Filled 2012-09-07: qty 3

## 2012-09-07 MED ORDER — ONDANSETRON HCL 4 MG PO TABS: 4.0000 mg | ORAL_TABLET | Freq: Four times a day (QID) | ORAL | Status: DC | PRN

## 2012-09-07 MED ORDER — INSULIN ASPART 100 UNIT/ML ~~LOC~~ SOLN
0.0000 [IU] | Freq: Three times a day (TID) | SUBCUTANEOUS | Status: DC
  Administered 2012-09-07 (×2): 3 [IU] via SUBCUTANEOUS
  Administered 2012-09-07 – 2012-09-09 (×4): 2 [IU] via SUBCUTANEOUS
  Administered 2012-09-10: 1 [IU] via SUBCUTANEOUS
  Administered 2012-09-10: 2 [IU] via SUBCUTANEOUS

## 2012-09-07 MED ORDER — ONDANSETRON HCL 4 MG/2ML IJ SOLN: 4.0000 mg | Freq: Four times a day (QID) | INTRAMUSCULAR | Status: DC | PRN

## 2012-09-07 MED ORDER — ASPIRIN EC 81 MG PO TBEC
81.0000 mg | DELAYED_RELEASE_TABLET | Freq: Every day | ORAL | Status: DC
  Administered 2012-09-07 – 2012-09-20 (×14): 81 mg via ORAL
  Filled 2012-09-07 (×14): qty 1

## 2012-09-07 MED ORDER — DEXTROSE 5 % IV SOLN
2.0000 g | INTRAVENOUS | Status: DC
  Administered 2012-09-07 – 2012-09-13 (×7): 2 g via INTRAVENOUS
  Filled 2012-09-07 (×7): qty 2

## 2012-09-07 MED ORDER — VALACYCLOVIR HCL 500 MG PO TABS
500.0000 mg | ORAL_TABLET | Freq: Two times a day (BID) | ORAL | Status: DC
  Administered 2012-09-08 – 2012-09-10 (×5): 500 mg via ORAL
  Filled 2012-09-07 (×9): qty 1

## 2012-09-07 MED ORDER — SODIUM CHLORIDE 0.9 % IV SOLN
INTRAVENOUS | Status: AC
  Administered 2012-09-07: 02:00:00 via INTRAVENOUS
  Administered 2012-09-07: 250 mL via INTRAVENOUS

## 2012-09-07 MED ORDER — ACETAMINOPHEN 325 MG PO TABS
650.0000 mg | ORAL_TABLET | Freq: Four times a day (QID) | ORAL | Status: DC | PRN
  Administered 2012-09-09 – 2012-09-14 (×8): 650 mg via ORAL
  Filled 2012-09-07 (×8): qty 2

## 2012-09-07 MED ORDER — AMLODIPINE BESYLATE 5 MG PO TABS
5.0000 mg | ORAL_TABLET | Freq: Every day | ORAL | Status: DC
  Administered 2012-09-07 – 2012-09-17 (×11): 5 mg via ORAL
  Filled 2012-09-07 (×12): qty 1

## 2012-09-07 MED ORDER — VANCOMYCIN HCL IN DEXTROSE 1-5 GM/200ML-% IV SOLN
1000.0000 mg | Freq: Once | INTRAVENOUS | Status: AC
  Administered 2012-09-07: 1000 mg via INTRAVENOUS
  Filled 2012-09-07: qty 200

## 2012-09-07 MED ORDER — OXYCODONE HCL 5 MG PO TABS
5.0000 mg | ORAL_TABLET | ORAL | Status: DC | PRN
  Filled 2012-09-07: qty 1

## 2012-09-07 MED ORDER — ATENOLOL 25 MG PO TABS
25.0000 mg | ORAL_TABLET | Freq: Every day | ORAL | Status: DC
  Administered 2012-09-07 – 2012-09-20 (×14): 25 mg via ORAL
  Filled 2012-09-07 (×14): qty 1

## 2012-09-07 MED ORDER — SIMVASTATIN 20 MG PO TABS
20.0000 mg | ORAL_TABLET | Freq: Every evening | ORAL | Status: DC
  Administered 2012-09-07 – 2012-09-18 (×12): 20 mg via ORAL
  Filled 2012-09-07 (×14): qty 1

## 2012-09-07 MED ORDER — INSULIN GLARGINE 100 UNIT/ML ~~LOC~~ SOLN
64.0000 [IU] | Freq: Every day | SUBCUTANEOUS | Status: DC
  Administered 2012-09-07 – 2012-09-08 (×2): 64 [IU] via SUBCUTANEOUS
  Filled 2012-09-07 (×3): qty 0.64

## 2012-09-07 MED ORDER — ACETAMINOPHEN 650 MG RE SUPP
650.0000 mg | Freq: Four times a day (QID) | RECTAL | Status: DC | PRN
  Administered 2012-09-14: 650 mg via RECTAL
  Filled 2012-09-07: qty 1

## 2012-09-07 MED ORDER — DEXTROSE 5 % IV SOLN
1.0000 g | Freq: Once | INTRAVENOUS | Status: AC
  Administered 2012-09-07: 1 g via INTRAVENOUS
  Filled 2012-09-07: qty 1

## 2012-09-07 MED ORDER — DOCUSATE SODIUM 100 MG PO CAPS
100.0000 mg | ORAL_CAPSULE | Freq: Two times a day (BID) | ORAL | Status: DC
  Administered 2012-09-08 – 2012-09-20 (×23): 100 mg via ORAL
  Filled 2012-09-07 (×28): qty 1

## 2012-09-07 MED ORDER — TRAVOPROST (BAK FREE) 0.004 % OP SOLN
1.0000 [drp] | Freq: Every day | OPHTHALMIC | Status: DC
  Administered 2012-09-07 – 2012-09-19 (×12): 1 [drp] via OPHTHALMIC
  Filled 2012-09-07 (×2): qty 2.5

## 2012-09-07 MED ORDER — SODIUM CHLORIDE 0.9 % IV SOLN
1250.0000 mg | Freq: Every day | INTRAVENOUS | Status: DC
  Administered 2012-09-07 – 2012-09-09 (×3): 1250 mg via INTRAVENOUS
  Filled 2012-09-07 (×4): qty 1250

## 2012-09-07 NOTE — Progress Notes (Signed)
After receiving tele alarm, spoke with patient whom was eating lunch in no apparent distress.  Checked electrodes and replaced two, spoke with central telemetry

## 2012-09-07 NOTE — Progress Notes (Signed)
ANTIBIOTIC CONSULT NOTE - INITIAL  Pharmacy Consult for vancomycin, cefepime Indication: Fever in cancer patient  Allergies  Allergen Reactions  . Ace Inhibitors     He is not sure about allergy to ace inhibitors    Patient Measurements: Height: 5' 5.5" (166.4 cm) Weight: 180 lb 1.9 oz (81.7 kg) IBW/kg (Calculated) : 62.65 Adjusted Body Weight:   Vital Signs: Temp: 98.5 F (36.9 C) (07/16 0115) Temp src: Oral (07/16 0115) BP: 154/95 mmHg (07/16 0115) Pulse Rate: 88 (07/16 0115) Intake/Output from previous day:   Intake/Output from this shift:    Labs:  Recent Labs  09/05/12 1334 09/05/12 1334 09/06/12 2136  WBC 3.5*  --  5.4  HGB 8.6*  --  8.5*  PLT 477*  --  520*  CREATININE  --  2.5* 1.97*   Estimated Creatinine Clearance: 37.7 ml/min (by C-G formula based on Cr of 1.97). No results found for this basename: VANCOTROUGH, VANCOPEAK, VANCORANDOM, GENTTROUGH, GENTPEAK, GENTRANDOM, TOBRATROUGH, TOBRAPEAK, TOBRARND, AMIKACINPEAK, AMIKACINTROU, AMIKACIN,  in the last 72 hours   Microbiology: Recent Results (from the past 720 hour(s))  CULTURE, BLOOD (ROUTINE X 2)     Status: None   Collection Time    08/14/12  8:45 AM      Result Value Range Status   Specimen Description BLOOD LEFT HAND   Final   Special Requests BOTTLES DRAWN AEROBIC AND ANAEROBIC 4CC EACH   Final   Culture  Setup Time 08/14/2012 17:47   Final   Culture NO GROWTH 5 DAYS   Final   Report Status 08/20/2012 FINAL   Final  CULTURE, BLOOD (ROUTINE X 2)     Status: None   Collection Time    08/14/12  9:00 AM      Result Value Range Status   Specimen Description BLOOD RIGHT HAND   Final   Special Requests BOTTLES DRAWN AEROBIC AND ANAEROBIC 5CC EACH   Final   Culture  Setup Time 08/14/2012 17:47   Final   Culture NO GROWTH 5 DAYS   Final   Report Status 08/20/2012 FINAL   Final  CULTURE, EXPECTORATED SPUTUM-ASSESSMENT     Status: None   Collection Time    08/14/12  9:42 AM      Result Value  Range Status   Specimen Description SPUTUM   Final   Special Requests Immunocompromised   Final   Sputum evaluation     Final   Value: THIS SPECIMEN IS ACCEPTABLE. RESPIRATORY CULTURE REPORT TO FOLLOW.   Report Status 08/14/2012 FINAL   Final  CULTURE, RESPIRATORY (NON-EXPECTORATED)     Status: None   Collection Time    08/14/12  9:42 AM      Result Value Range Status   Specimen Description SPUTUM   Final   Special Requests NONE   Final   Gram Stain     Final   Value: NO WBC SEEN     RARE SQUAMOUS EPITHELIAL CELLS PRESENT     FEW GRAM POSITIVE COCCI IN PAIRS     FEW GRAM POSITIVE RODS   Culture NORMAL OROPHARYNGEAL FLORA   Final   Report Status 08/16/2012 FINAL   Final  URINE CULTURE     Status: None   Collection Time    08/14/12 10:17 AM      Result Value Range Status   Specimen Description URINE, CATHETERIZED   Final   Special Requests NONE   Final   Culture  Setup Time 08/14/2012 15:22   Final  Colony Count 3,000 COLONIES/ML   Final   Culture ENTEROBACTER AEROGENES   Final   Report Status 08/16/2012 FINAL   Final   Organism ID, Bacteria ENTEROBACTER AEROGENES   Final  MRSA PCR SCREENING     Status: None   Collection Time    08/14/12 11:55 AM      Result Value Range Status   MRSA by PCR NEGATIVE  NEGATIVE Final   Comment:            The GeneXpert MRSA Assay (FDA     approved for NASAL specimens     only), is one component of a     comprehensive MRSA colonization     surveillance program. It is not     intended to diagnose MRSA     infection nor to guide or     monitor treatment for     MRSA infections.  RESPIRATORY VIRUS PANEL     Status: Abnormal   Collection Time    08/14/12  2:13 PM      Result Value Range Status   Source - RVPAN NASAL SWAB   Corrected   Comment: CORRECTED ON 06/23 AT 1913: PREVIOUSLY REPORTED AS NASAL SWAB   Respiratory Syncytial Virus A NOT DETECTED   Final   Respiratory Syncytial Virus B NOT DETECTED   Final   Influenza A NOT DETECTED    Final   Influenza B NOT DETECTED   Final   Parainfluenza 1 NOT DETECTED   Final   Parainfluenza 2 NOT DETECTED   Final   Parainfluenza 3 DETECTED (*)  Final   Metapneumovirus NOT DETECTED   Final   Rhinovirus NOT DETECTED   Final   Adenovirus NOT DETECTED   Final   Influenza A H1 NOT DETECTED   Final   Influenza A H3 NOT DETECTED   Final   Comment: (NOTE)           Normal Reference Range for each Analyte: NOT DETECTED     Testing performed using the Luminex xTAG Respiratory Viral Panel test     kit.     This test was developed and its performance characteristics determined     by [employer]. It has not been cleared or approved by the [place]     Food and Drug Administration. This test is used for clinical purposes.     It should not be regarded as investigational or for research. This     laboratory is certified under the Clinical Laboratory Improvement     Amendments of 1988 (CLIA) as qualified to perform high complexity     clinical laboratory testing.  AFB CULTURE WITH SMEAR     Status: None   Collection Time    08/15/12  6:24 PM      Result Value Range Status   Specimen Description BRONCHIAL ALVEOLAR LAVAGE   Final   Special Requests Immunocompromised   Final   ACID FAST SMEAR NO ACID FAST BACILLI SEEN   Final   Culture     Final   Value: CULTURE WILL BE EXAMINED FOR 6 WEEKS BEFORE ISSUING A FINAL REPORT   Report Status PENDING   Incomplete  CULTURE, BAL-QUANTITATIVE     Status: None   Collection Time    08/15/12  6:24 PM      Result Value Range Status   Specimen Description BRONCHIAL ALVEOLAR LAVAGE   Final   Special Requests Immunocompromised   Final   Gram Stain  Final   Value: MODERATE WBC PRESENT,BOTH PMN AND MONONUCLEAR     NO SQUAMOUS EPITHELIAL CELLS SEEN     NO ORGANISMS SEEN   Colony Count 300 COLONIES/ML   Final   Culture PSEUDOMONAS AERUGINOSA   Final   Report Status 08/18/2012 FINAL   Final   Organism ID, Bacteria PSEUDOMONAS AERUGINOSA   Final   FUNGUS CULTURE W SMEAR     Status: None   Collection Time    08/15/12  6:24 PM      Result Value Range Status   Specimen Description BRONCHIAL ALVEOLAR LAVAGE   Final   Special Requests Immunocompromised   Final   Fungal Smear NO YEAST OR FUNGAL ELEMENTS SEEN   Final   Culture CULTURE IN PROGRESS FOR FOUR WEEKS   Final   Report Status PENDING   Incomplete  LEGIONELLA CULTURE     Status: None   Collection Time    08/15/12  6:24 PM      Result Value Range Status   Specimen Description BRONCHIAL ALVEOLAR LAVAGE   Final   Special Requests Immunocompromised   Final   Culture NO LEGIONELLA ISOLATED   Final   Report Status 08/21/2012 FINAL   Final  PNEUMOCYSTIS JIROVECI SMEAR BY DFA     Status: None   Collection Time    08/15/12  6:24 PM      Result Value Range Status   Specimen Source-[facility] BRONCHIAL ALVEOLAR LAVAGE   Final   Pneumocystis jiroveci Ag NEGATIVE   Final   Comment: Performed at [facility]  RESPIRATORY VIRUS PANEL     Status: Abnormal   Collection Time    08/15/12  6:24 PM      Result Value Range Status   Source - RVPAN BRONCHIAL ALVEOLAR LAVAGE   Corrected   Comment: CORRECTED ON 06/24 AT 1801: PREVIOUSLY REPORTED AS BRONCHIAL ALVEOLAR LAVAGE   Respiratory Syncytial Virus A NOT DETECTED   Final   Respiratory Syncytial Virus B NOT DETECTED   Final   Influenza A NOT DETECTED   Final   Influenza B NOT DETECTED   Final   Parainfluenza 1 NOT DETECTED   Final   Parainfluenza 2 NOT DETECTED   Final   Parainfluenza 3 DETECTED (*)  Final   Comment: RESULT CALLED TO, READ BACK BY AND VERIFIED WITH:     Hughes Better RN [id number] 08/17/12 A NAVARRO   Metapneumovirus NOT DETECTED   Final   Rhinovirus NOT DETECTED   Final   Adenovirus NOT DETECTED   Final   Influenza A H1 NOT DETECTED   Final   Influenza A H3 NOT DETECTED   Final   Comment: (NOTE)           Normal Reference Range for each Analyte: NOT DETECTED     Testing performed using the Luminex xTAG Respiratory  Viral Panel test     kit.     This test was developed and its performance characteristics determined     by [employer]. It has not been cleared or approved by the [place]     Food and Drug Administration. This test is used for clinical purposes.     It should not be regarded as investigational or for research. This     laboratory is certified under the Clinical Laboratory Improvement     Amendments of 1988 (CLIA) as qualified to perform high complexity     clinical laboratory testing.  URINE CULTURE  Status: None   Collection Time    08/30/12 10:36 AM      Result Value Range Status   Colony Count 5,000 COLONIES/ML   Final   Organism ID, Bacteria Insignificant Growth   Final    Medical History: Past Medical History  Diagnosis Date  . Allergy   . Diabetes mellitus   . Hypertension   . Hepatitis C   . Multiple myeloma   . Multiple myeloma   . Chronic kidney disease   . Shortness of breath     Medications:  Anti-infectives   Start     Dose/Rate Route Frequency Ordered Stop   09/07/12 2200  vancomycin (VANCOCIN) 1,250 mg in sodium chloride 0.9 % 250 mL IVPB     1,250 mg 166.7 mL/hr over 90 Minutes Intravenous Daily at bedtime 09/07/12 0531     09/07/12 1200  ceFEPIme (MAXIPIME) 2 g in dextrose 5 % 50 mL IVPB     2 g 100 mL/hr over 30 Minutes Intravenous Every 24 hours 09/07/12 0531     09/07/12 0200  vancomycin (VANCOCIN) IVPB 1000 mg/200 mL premix     1,000 mg 200 mL/hr over 60 Minutes Intravenous  Once 09/07/12 0155 09/07/12 0350   09/07/12 0145  valACYclovir (VALTREX) tablet 500 mg     500 mg Oral 2 times daily 09/07/12 0141     09/07/12 0015  ceFEPIme (MAXIPIME) 1 g in dextrose 5 % 50 mL IVPB     1 g 100 mL/hr over 30 Minutes Intravenous  Once 09/07/12 0010 09/07/12 0111     Assessment: Patient with cancer and fever.  Suspected infection with empiric antibiotics ordered.  First dose of antibiotics already given in ED.  Goal of Therapy:  Vancomycin trough  level 15-20 mcg/ml Cefepime dosed based on patient weight and renal function  Plan:  Measure antibiotic drug levels at steady state Follow up culture results Vancomycin 1250mg  iv q24hr (after 1gm load), cefepime 2gm iv q24hr  Aleene Davidson Crowford 09/07/2012,5:32 AM

## 2012-09-07 NOTE — Progress Notes (Addendum)
VASCULAR LAB PRELIMINARY  PRELIMINARY  PRELIMINARY  PRELIMINARY  Bilateral lower extremity venous duplex completed.    Preliminary report:  Bilateral:  No evidence of acute DVT, superficial thrombosis, or Baker's Cyst. 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 All other areas of DVT appear to have resolved.   Carrington Olazabal, RVS 09/07/2012, 9:25 AM

## 2012-09-07 NOTE — Progress Notes (Signed)
Subjective: Patient seen in MRI, he was alert oriented, no apparent distress. Denies fever chills shortness of breath wheeze cough dysuria, denies any wounds. Recently he was admitted with Pseudomonas  Pneumonia, and completed all antibiotics. He had outpatient followup he was clinically stable. Apparently he was admitted with some low-grade temperature and some confusional state. He denies hypoglycemia in fact he had been having high blood sugars. Currently he appears back to his baseline. In the emergency room he had a low-grade temperature, he was not neutropenic. He has been started on broad-spectrum antibiotics, panculture and other screening labs have been obtained. Chest x-ray was negative for pneumonia. Patient does have myeloma, therefore a new system is compromised  Objective: Vital signs in last 24 hours: Temp:  [98.2 F (36.8 C)-99.8 F (37.7 C)] 98.2 F (36.8 C) (07/16 0600) Pulse Rate:  [72-100] 72 (07/16 0600) Resp:  [18-20] 18 (07/16 0600) BP: (134-155)/(82-96) 155/82 mmHg (07/16 0600) SpO2:  [95 %-98 %] 98 % (07/16 0600) Weight:  [81.7 kg (180 lb 1.9 oz)] 81.7 kg (180 lb 1.9 oz) (07/16 0115) Weight change:  Last BM Date: 09/06/12  Intake/Output from previous day: 07/15 0701 - 07/16 0700 In: 281.3 [I.V.:281.3] Out: 400 [Urine:400] Intake/Output this shift:    General appearance: alert, cooperative and no apparent distress Resp: clear to auscultation bilaterally Cardio: regular rate and rhythm, S1, S2 normal, no murmur, click, rub or gallop Extremities: patient does have pedal edema, no calf pain  Lab Results:  Results for orders placed during the hospital encounter of 09/06/12 (from the past 24 hour(s))  CBC WITH DIFFERENTIAL     Status: Abnormal   Collection Time    09/06/12  9:36 PM      Result Value Range   WBC 5.4  4.0 - 10.5 K/uL   RBC 3.07 (*) 4.22 - 5.81 MIL/uL   Hemoglobin 8.5 (*) 13.0 - 17.0 g/dL   HCT 54.0 (*) 98.1 - 19.1 %   MCV 88.6  78.0 - 100.0 fL    MCH 27.7  26.0 - 34.0 pg   MCHC 31.3  30.0 - 36.0 g/dL   RDW 47.8  29.5 - 62.1 %   Platelets 520 (*) 150 - 400 K/uL   Neutrophils Relative % 74  43 - 77 %   Lymphocytes Relative 12  12 - 46 %   Monocytes Relative 13 (*) 3 - 12 %   Eosinophils Relative 1  0 - 5 %   Basophils Relative 0  0 - 1 %   Neutro Abs 4.0  1.7 - 7.7 K/uL   Lymphs Abs 0.6 (*) 0.7 - 4.0 K/uL   Monocytes Absolute 0.7  0.1 - 1.0 K/uL   Eosinophils Absolute 0.1  0.0 - 0.7 K/uL   Basophils Absolute 0.0  0.0 - 0.1 K/uL   Smear Review MORPHOLOGY UNREMARKABLE    COMPREHENSIVE METABOLIC PANEL     Status: Abnormal   Collection Time    09/06/12  9:36 PM      Result Value Range   Sodium 130 (*) 135 - 145 mEq/L   Potassium 4.8  3.5 - 5.1 mEq/L   Chloride 95 (*) 96 - 112 mEq/L   CO2 19  19 - 32 mEq/L   Glucose, Bld 153 (*) 70 - 99 mg/dL   BUN 34 (*) 6 - 23 mg/dL   Creatinine, Ser 3.08 (*) 0.50 - 1.35 mg/dL   Calcium 65.7  8.4 - 84.6 mg/dL   Total Protein 8.3  6.0 -  8.3 g/dL   Albumin 1.8 (*) 3.5 - 5.2 g/dL   AST 13  0 - 37 U/L   ALT 8  0 - 53 U/L   Alkaline Phosphatase 117  39 - 117 U/L   Total Bilirubin 0.3  0.3 - 1.2 mg/dL   GFR calc non Af Amer 34 (*) >90 mL/min   GFR calc Af Amer 40 (*) >90 mL/min  LACTIC ACID, PLASMA     Status: None   Collection Time    09/06/12  9:36 PM      Result Value Range   Lactic Acid, Venous 1.5  0.5 - 2.2 mmol/L  URINALYSIS, ROUTINE W REFLEX MICROSCOPIC     Status: Abnormal   Collection Time    09/06/12 10:23 PM      Result Value Range   Color, Urine YELLOW  YELLOW   APPearance CLOUDY (*) CLEAR   Specific Gravity, Urine 1.034 (*) 1.005 - 1.030   pH 5.0  5.0 - 8.0   Glucose, UA 250 (*) NEGATIVE mg/dL   Hgb urine dipstick LARGE (*) NEGATIVE   Bilirubin Urine NEGATIVE  NEGATIVE   Ketones, ur NEGATIVE  NEGATIVE mg/dL   Protein, ur >161 (*) NEGATIVE mg/dL   Urobilinogen, UA 0.2  0.0 - 1.0 mg/dL   Nitrite NEGATIVE  NEGATIVE   Leukocytes, UA NEGATIVE  NEGATIVE  URINE  MICROSCOPIC-ADD ON     Status: Abnormal   Collection Time    09/06/12 10:23 PM      Result Value Range   Squamous Epithelial / LPF RARE  RARE   WBC, UA 0-2  <3 WBC/hpf   RBC / HPF 3-6  <3 RBC/hpf   Bacteria, UA RARE  RARE   Casts HYALINE CASTS (*) NEGATIVE  AMMONIA     Status: None   Collection Time    09/07/12 12:40 AM      Result Value Range   Ammonia 34  11 - 60 umol/L  GLUCOSE, CAPILLARY     Status: Abnormal   Collection Time    09/07/12  2:49 AM      Result Value Range   Glucose-Capillary 134 (*) 70 - 99 mg/dL  URINE RAPID DRUG SCREEN (HOSP PERFORMED)     Status: None   Collection Time    09/07/12  5:16 AM      Result Value Range   Opiates NONE DETECTED  NONE DETECTED   Cocaine NONE DETECTED  NONE DETECTED   Benzodiazepines NONE DETECTED  NONE DETECTED   Amphetamines NONE DETECTED  NONE DETECTED   Tetrahydrocannabinol NONE DETECTED  NONE DETECTED   Barbiturates NONE DETECTED  NONE DETECTED  AMMONIA     Status: None   Collection Time    09/07/12  5:55 AM      Result Value Range   Ammonia 41  11 - 60 umol/L  HEPATIC FUNCTION PANEL     Status: Abnormal   Collection Time    09/07/12  5:55 AM      Result Value Range   Total Protein 7.4  6.0 - 8.3 g/dL   Albumin 1.6 (*) 3.5 - 5.2 g/dL   AST 11  0 - 37 U/L   ALT 8  0 - 53 U/L   Alkaline Phosphatase 105  39 - 117 U/L   Total Bilirubin 0.2 (*) 0.3 - 1.2 mg/dL   Bilirubin, Direct <0.9  0.0 - 0.3 mg/dL   Indirect Bilirubin NOT CALCULATED  0.3 - 0.9 mg/dL  BASIC METABOLIC  PANEL     Status: Abnormal   Collection Time    09/07/12  5:55 AM      Result Value Range   Sodium 134 (*) 135 - 145 mEq/L   Potassium 4.1  3.5 - 5.1 mEq/L   Chloride 101  96 - 112 mEq/L   CO2 20  19 - 32 mEq/L   Glucose, Bld 184 (*) 70 - 99 mg/dL   BUN 31 (*) 6 - 23 mg/dL   Creatinine, Ser 5.40 (*) 0.50 - 1.35 mg/dL   Calcium 9.7  8.4 - 98.1 mg/dL   GFR calc non Af Amer 34 (*) >90 mL/min   GFR calc Af Amer 40 (*) >90 mL/min  CBC WITH  DIFFERENTIAL     Status: Abnormal   Collection Time    09/07/12  5:55 AM      Result Value Range   WBC 3.8 (*) 4.0 - 10.5 K/uL   RBC 2.89 (*) 4.22 - 5.81 MIL/uL   Hemoglobin 8.2 (*) 13.0 - 17.0 g/dL   HCT 19.1 (*) 47.8 - 29.5 %   MCV 87.5  78.0 - 100.0 fL   MCH 28.4  26.0 - 34.0 pg   MCHC 32.4  30.0 - 36.0 g/dL   RDW 62.1  30.8 - 65.7 %   Platelets 454 (*) 150 - 400 K/uL   Neutrophils Relative % 64  43 - 77 %   Neutro Abs 2.4  1.7 - 7.7 K/uL   Lymphocytes Relative 17  12 - 46 %   Lymphs Abs 0.7  0.7 - 4.0 K/uL   Monocytes Relative 18 (*) 3 - 12 %   Monocytes Absolute 0.7  0.1 - 1.0 K/uL   Eosinophils Relative 1  0 - 5 %   Eosinophils Absolute 0.0  0.0 - 0.7 K/uL   Basophils Relative 0  0 - 1 %   Basophils Absolute 0.0  0.0 - 0.1 K/uL  GLUCOSE, CAPILLARY     Status: Abnormal   Collection Time    09/07/12  8:17 AM      Result Value Range   Glucose-Capillary 159 (*) 70 - 99 mg/dL   Comment 1 Notify RN        Studies/Results: Dg Chest 2 View  09/06/2012   *RADIOLOGY REPORT*  Clinical Data: Fever.  Recent pneumonia.  CHEST - 2 VIEW  Comparison: 08/30/2012.  Findings: Compression fractures of multiple lower thoracic and upper lumbar vertebrae, causing exaggerated kyphosis.  Status post bone augmentation in the upper lumbar spine, likely L1 and L2.  No appreciable change from prior, with sensitivity decreased by advanced osteopenia.  Low lung volumes with interstitial crowding.  No definitive lobar consolidation, effusion, pneumothorax.  No change in mediastinal size contours.  IMPRESSION: No definite change from prior to suggest acute cardiopulmonary disease.  Very low lung volumes decreases sensitivity for detecting pneumonia.   Original Report Authenticated By: Tiburcio Pea   Mr Brain Wo Contrast  09/07/2012   *RADIOLOGY REPORT*  Clinical Data: Encephalopathy.  Myeloma.  Renal disease.  Diabetes.  MRI HEAD WITHOUT CONTRAST  Technique:  Multiplanar, multiecho pulse sequences of the  brain and surrounding structures were obtained according to standard protocol without intravenous contrast.  Comparison: CT head 01/27/2012.  Findings: Widespread lytic lesions throughout the skull are redemonstrated, and increased compared with prior CT head.  The largest lesion is in the left occipital bone, cross section 10 x 27 mm, which has invaded the intracranial compartment but does not significantly  compress the brain.  No acute stroke or hemorrhage.  No intra-axial mass lesion or hydrocephalus.  Premature cerebral and cerebellar atrophy.  Slight white matter disease.  No chronic hemorrhage.  Flow voids are maintained in the major intracranial vessels.  Normal pituitary and cerebellar tonsils. Within limits of evaluation on noncontrast MR, no visible venous sinus occlusion.  Heterogeneous upper cervical bone marrow reflecting myeloma  with spondylosis at C3-4 mildly compressing the cord.  Chronic sinusitis.  No mastoid fluid.  Negative orbits.  IMPRESSION: Premature cerebral and cerebellar atrophy.  No acute stroke, hemorrhage, for intracranial inflammatory process.  Progression of osseous myeloma changes in the skull, with a moderate sized deposit left occipital bone with early intracranial extension.  Continued surveillance warranted.   Original Report Authenticated By: Davonna Belling, M.D.    Medications:  Prior to Admission:  Prescriptions prior to admission  Medication Sig Dispense Refill  . amLODipine (NORVASC) 5 MG tablet Take 5 mg by mouth daily.        Marland Kitchen aspirin 81 MG tablet Take 81 mg by mouth daily.      Marland Kitchen atenolol (TENORMIN) 25 MG tablet Take 25 mg by mouth daily.      . bisacodyl (BISACODYL) 5 MG EC tablet Take 15 mg by mouth every other day.       Marland Kitchen dexamethasone (DECADRON) 4 MG tablet TAKE 10 TABLETS (40 MG) ONCE WEEKLY  130 tablet  0  . diphenhydrAMINE (SOMINEX) 25 MG tablet Take 25 mg by mouth at bedtime.      . docusate sodium (COLACE) 100 MG capsule Take 2 capsules by mouth  twice daily      . filgrastim (NEUPOGEN) 480 MCG/0.8ML SOLN injection Inject 0.8 mLs (480 mcg total) into the skin as directed.  4 Syringe  1  . HUMALOG 100 UNIT/ML injection Inject into the skin 3 (three) times daily before meals. Sliding scale      . insulin glargine (LANTUS) 100 UNIT/ML injection Inject 64 Units into the skin at bedtime.       . mineral oil external liquid Apply 1 application topically every other day. Every other day apply to stomach      . oxycodone (OXY-IR) 5 MG capsule Take 5 mg by mouth every 4 (four) hours as needed.      Marland Kitchen oxyCODONE (OXYCONTIN) 10 MG 12 hr tablet Take 1 tablet (10 mg total) by mouth every 12 (twelve) hours.  60 tablet  0  . POMALYST 4 MG capsule Take 4 mg by mouth daily.       . simvastatin (ZOCOR) 20 MG tablet Take 20 mg by mouth every evening.      . TRAVATAN Z 0.004 % SOLN ophthalmic solution Place 1 drop into both eyes at bedtime.      . valACYclovir (VALTREX) 500 MG tablet Take 1 tablet (500 mg total) by mouth 2 (two) times daily.  14 tablet  1   Scheduled: . amLODipine  5 mg Oral Daily  . aspirin EC  81 mg Oral Daily  . atenolol  25 mg Oral Daily  . bisacodyl  15 mg Oral QODAY  . ceFEPime (MAXIPIME) IV  2 g Intravenous Q24H  . docusate sodium  100 mg Oral BID  . enoxaparin (LOVENOX) injection  40 mg Subcutaneous Q24H  . insulin aspart  0-9 Units Subcutaneous TID WC  . insulin glargine  64 Units Subcutaneous QHS  . [START ON 09/08/2012] mineral oil  10 mL Other QODAY  . OxyCODONE  10 mg Oral BID  . simvastatin  20 mg Oral QPM  . sodium chloride  3 mL Intravenous Q12H  . Travoprost (BAK Free)  1 drop Both Eyes QHS  . valACYclovir  500 mg Oral BID  . vancomycin  1,250 mg Intravenous QHS   Continuous: . sodium chloride 75 mL/hr at 09/07/12 0215    Assessment/Plan: Mental status change in a patient with low-grade temperature, history of myeloma, recent hospitalization for Pseudomonas pneumonia. Currently patient back to his baseline. We  will continue broad-spectrum antibiotics, followup cultures, MRI has been obtained we will follow this up as well. Principal Problem:   Fever Active Problems:   Multiple myeloma in relapse, patient's hematologist will be made aware of his hospitalization   Hypertension   Stage III chronic kidney disease     LOS: 1 day   Mishon Blubaugh D 09/07/2012, 12:04 PM

## 2012-09-07 NOTE — H&P (Signed)
[facility] History and Physical  Rodney Green ZOX:[id number] DOB: 04/30/1945 DOA: 09/06/2012  Referring physician: ER physician. PCP: Rodney Apo, MD  Specialists: Dr. Shirline Frees. Oncologist.  Chief Complaint: Fever and confusion.  HPI: Rodney Green is a 67 y.o. male with known history of multiple myeloma who was recently admitted for septic shock and respiratory failure secondary to pseudomonas pneumonia discharged 2 weeks ago and has recently finished a course of ciprofloxacin and patient's chemotherapy is on hold at this time was brought to the ER because of increased weakness confusion and subjective feeling of fever and chills. As per patient's wife was providing most of the history patient has been having periods of confusion over the last few days since his discharge. Last evening around 7 PM patient was very confused and was brought to the ER. Patient does not recall the incident. By the time patient reached ER patient was back to normal and nonfocal. In addition patient was having subjective feeling of fever chills and patient's wife states that 2 days ago patient had a temperature of 102F. Patient also in addition has been having of weakness and fatigue and has received packed red blood cell transfusion last week. Patient denies any nausea vomiting chest pain shortness of breath productive cough diarrhea.  Review of Systems: As presented in the history of presenting illness, rest negative.  Past Medical History  Diagnosis Date  . Allergy   . Diabetes mellitus   . Hypertension   . Hepatitis C   . Multiple myeloma   . Multiple myeloma   . Chronic kidney disease   . Shortness of breath    Past Surgical History  Procedure Laterality Date  . Circumcision    . Hand surgery    . Abdominal abcess     Social History:  reports that he quit smoking about 4 years ago. His smoking use included Cigarettes. He has a 22 pack-year smoking history. He has never used smokeless  tobacco. He reports that he does not drink alcohol or use illicit drugs. Home. where does patient live-- Can do ADLs. Can patient participate in ADLs?  Allergies  Allergen Reactions  . Ace Inhibitors     He is not sure about allergy to ace inhibitors    Family History  Problem Relation Age of Onset  . Cancer Sister   . Diabetes Other   . Hypertension Other       Prior to Admission medications   Medication Sig Start Date End Date Taking? Authorizing Provider  amLODipine (NORVASC) 5 MG tablet Take 5 mg by mouth daily.     Yes Historical Provider, MD  aspirin 81 MG tablet Take 81 mg by mouth daily.   Yes Historical Provider, MD  atenolol (TENORMIN) 25 MG tablet Take 25 mg by mouth daily. 08/22/12  Yes Tammy S Parrett, NP  bisacodyl (BISACODYL) 5 MG EC tablet Take 15 mg by mouth every other day.    Yes Historical Provider, MD  dexamethasone (DECADRON) 4 MG tablet TAKE 10 TABLETS (40 MG) ONCE WEEKLY 06/16/12  Yes Si Gaul, MD  diphenhydrAMINE (SOMINEX) 25 MG tablet Take 25 mg by mouth at bedtime.   Yes Historical Provider, MD  docusate sodium (COLACE) 100 MG capsule Take 2 capsules by mouth twice daily   Yes Historical Provider, MD  filgrastim (NEUPOGEN) 480 MCG/0.8ML SOLN injection Inject 0.8 mLs (480 mcg total) into the skin as directed. 04/21/12  Yes Adrena E Johnson, PA-C  HUMALOG 100 UNIT/ML injection Inject into  the skin 3 (three) times daily before meals. Sliding scale 07/07/12  Yes Historical Provider, MD  insulin glargine (LANTUS) 100 UNIT/ML injection Inject 64 Units into the skin at bedtime.    Yes Historical Provider, MD  mineral oil external liquid Apply 1 application topically every other day. Every other day apply to stomach   Yes Historical Provider, MD  oxycodone (OXY-IR) 5 MG capsule Take 5 mg by mouth every 4 (four) hours as needed. 06/16/12  Yes Si Gaul, MD  oxyCODONE (OXYCONTIN) 10 MG 12 hr tablet Take 1 tablet (10 mg total) by mouth every 12 (twelve) hours.  08/22/12  Yes Si Gaul, MD  POMALYST 4 MG capsule Take 4 mg by mouth daily.  07/20/12  Yes Historical Provider, MD  simvastatin (ZOCOR) 20 MG tablet Take 20 mg by mouth every evening.   Yes Historical Provider, MD  TRAVATAN Z 0.004 % SOLN ophthalmic solution Place 1 drop into both eyes at bedtime. 06/14/12  Yes Historical Provider, MD  valACYclovir (VALTREX) 500 MG tablet Take 1 tablet (500 mg total) by mouth 2 (two) times daily. 08/22/12  Yes Julio Sicks, NP   Physical Exam: Filed Vitals:   09/06/12 2053 09/06/12 2345  BP: 134/83 150/96  Pulse: 100 95  Temp: 99.8 F (37.7 C) 99 F (37.2 C)  TempSrc: Oral Oral  Resp: 18 19  SpO2: 95% 96%     General:  Well-developed and nourished.  Eyes: Anicteric no pallor.  ENT: No discharge from ears eyes nose mouth.  Neck: No mass felt.  Cardiovascular: S1-S2 heard.  Respiratory: No rhonchi or crepitations.  Abdomen: Soft nontender bowel sounds present.  Skin: No rash.  Musculoskeletal: Bilateral lower extremity edema.  Psychiatric: Appears normal at this time.  Neurologic: Alert awake oriented to time place and person. Moves all extremities.  Labs on Admission:  Basic Metabolic Panel:  Recent Labs Lab 09/05/12 1334 09/06/12 2136  NA 135* 130*  K 4.5 4.8  CL  --  95*  CO2 23 19  GLUCOSE 329* 153*  BUN 28.8* 34*  CREATININE 2.5* 1.97*  CALCIUM 10.2 10.1   Liver Function Tests:  Recent Labs Lab 09/05/12 1334 09/06/12 2136  AST 21 13  ALT 14 8  ALKPHOS 122 117  BILITOT 0.37 0.3  PROT 8.2 8.3  ALBUMIN 1.6* 1.8*   No results found for this basename: LIPASE, AMYLASE,  in the last 168 hours  Recent Labs Lab 09/07/12 0040  AMMONIA 34   CBC:  Recent Labs Lab 09/05/12 1334 09/06/12 2136  WBC 3.5* 5.4  NEUTROABS 2.4 4.0  HGB 8.6* 8.5*  HCT 26.1* 27.2*  MCV 89.2 88.6  PLT 477* 520*   Cardiac Enzymes: No results found for this basename: CKTOTAL, CKMB, CKMBINDEX, TROPONINI,  in the last 168  hours  BNP (last 3 results)  Recent Labs  08/15/12 1915 08/16/12 0517 08/19/12 0430  PROBNP 4436.0* 3509.0* 3363.0*   CBG: No results found for this basename: GLUCAP,  in the last 168 hours  Radiological Exams on Admission: Dg Chest 2 View  09/06/2012   *RADIOLOGY REPORT*  Clinical Data: Fever.  Recent pneumonia.  CHEST - 2 VIEW  Comparison: 08/30/2012.  Findings: Compression fractures of multiple lower thoracic and upper lumbar vertebrae, causing exaggerated kyphosis.  Status post bone augmentation in the upper lumbar spine, likely L1 and L2.  No appreciable change from prior, with sensitivity decreased by advanced osteopenia.  Low lung volumes with interstitial crowding.  No definitive lobar consolidation,  effusion, pneumothorax.  No change in mediastinal size contours.  IMPRESSION: No definite change from prior to suggest acute cardiopulmonary disease.  Very low lung volumes decreases sensitivity for detecting pneumonia.   Original Report Authenticated By: Tiburcio Pea     Assessment/Plan Principal Problem:   Fever Active Problems:   Multiple myeloma in relapse   Hypertension   Stage III chronic kidney disease   Acute encephalopathy   1. Fever - presently mildly febrile. Blood cultures and urine cultures have been ordered. Patient has been started on empiric antibiotics. Not sure what is causing the fever at this time. Follow cultures also check for lower extremity DVT since patient has edema. Fever may be also related to multiple myeloma. 2. Acute encephalopathy/delirium - patient presently is well-oriented. As per wife patient gets episodic confusion. This may be related to his fever. Possibly secondary to medications also. Check urine drug screen. Check MRI brain and EEG. Patient is on Valtrex and not sure if this has to be continued need to check with his oncologist in a.m. 3. Chronic kidney disease - creatinine appears to be at baseline. Closely follow intake  output. 4. Chronic anemia - closely follow CBC. 5. Diabetes mellitus type 2 - continue home medications with sliding-scale coverage. 6. Hypertension - continue present medications. 7. Recently admitted for Pseudomonas pneumonia with septic shock. 8. Bilateral lower extremity edema - check Dopplers to rule out DVT.    Code Status: Full code.  Family Communication: Patient's wife at the bedside.  Disposition Plan: Admit to inpatient under Dr. Nehemiah Settle.   Chandani Rogowski N. [facility] Pager [phone].  If 7PM-7AM, please contact night-coverage [email or web address] Password [email or web address] 09/07/2012, 1:17 AM

## 2012-09-08 ENCOUNTER — Encounter: Payer: Self-pay | Admitting: Internal Medicine

## 2012-09-08 LAB — URINE CULTURE
Colony Count: NO GROWTH
Culture: NO GROWTH

## 2012-09-08 LAB — CBC
Hemoglobin: 8 g/dL — ABNORMAL LOW (ref 13.0–17.0)
MCH: 28.3 pg (ref 26.0–34.0)
MCV: 88 fL (ref 78.0–100.0)
RBC: 2.83 MIL/uL — ABNORMAL LOW (ref 4.22–5.81)

## 2012-09-08 LAB — BASIC METABOLIC PANEL
CO2: 21 mEq/L (ref 19–32)
Calcium: 9.6 mg/dL (ref 8.4–10.5)
Creatinine, Ser: 1.9 mg/dL — ABNORMAL HIGH (ref 0.50–1.35)
Glucose, Bld: 146 mg/dL — ABNORMAL HIGH (ref 70–99)

## 2012-09-08 LAB — GLUCOSE, CAPILLARY: Glucose-Capillary: 162 mg/dL — ABNORMAL HIGH (ref 70–99)

## 2012-09-08 MED ORDER — MINERAL OIL PO OIL
10.0000 mL | TOPICAL_OIL | ORAL | Status: DC
  Administered 2012-09-08 – 2012-09-18 (×6): 10 mL
  Filled 2012-09-08 (×8): qty 30

## 2012-09-08 NOTE — Progress Notes (Signed)
Received letter from [employer].  [employer] will provide assistance to reduce pt's copay to $25 per Rx for Pomalyst.  Pt will continue to receive assistance for the duration of 2014 to a maximum benefit of $10,000.  I will forward letter to Medical records to be scanned in pt's chart.

## 2012-09-08 NOTE — Progress Notes (Signed)
Subjective: Patient pleasant, no fever no chills, currently afebrile, to date no signs of infection, no confusion. MRI without acute abnormality. Patient's hemoglobin has trended down to 8. Hematology was notified of his admission yesterday.  Objective: Vital signs in last 24 hours: Temp:  [97.9 F (36.6 C)-98.8 F (37.1 C)] 98.8 F (37.1 C) (07/17 0537) Pulse Rate:  [78-99] 99 (07/17 0537) Resp:  [18-20] 20 (07/17 0537) BP: (150-170)/(83-99) 164/83 mmHg (07/17 0537) SpO2:  [95 %-97 %] 97 % (07/17 0537) Weight change:  Last BM Date: 09/06/12  Intake/Output from previous day: 07/16 0701 - 07/17 0700 In: 921.9 [P.O.:240; I.V.:631.9; IV Piggyback:50] Out: -  Intake/Output this shift: Total I/O In: -  Out: 500 [Urine:500]  General appearance: patient is pleasant, no apparent distress Resp: clear to auscultation bilaterally Cardio: regular rate and rhythm, S1, S2 normal, no murmur, click, rub or gallop Extremities: pedal edema  Lab Results:  Results for orders placed during the hospital encounter of 09/06/12 (from the past 24 hour(s))  GLUCOSE, CAPILLARY     Status: Abnormal   Collection Time    09/07/12 11:58 AM      Result Value Range   Glucose-Capillary 248 (*) 70 - 99 mg/dL   Comment 1 Notify RN    GLUCOSE, CAPILLARY     Status: Abnormal   Collection Time    09/07/12  4:29 PM      Result Value Range   Glucose-Capillary 234 (*) 70 - 99 mg/dL   Comment 1 Notify RN    GLUCOSE, CAPILLARY     Status: Abnormal   Collection Time    09/07/12  9:46 PM      Result Value Range   Glucose-Capillary 238 (*) 70 - 99 mg/dL   Comment 1 Notify RN    CBC     Status: Abnormal   Collection Time    09/08/12  5:25 AM      Result Value Range   WBC 4.3  4.0 - 10.5 K/uL   RBC 2.83 (*) 4.22 - 5.81 MIL/uL   Hemoglobin 8.0 (*) 13.0 - 17.0 g/dL   HCT 16.1 (*) 09.6 - 04.5 %   MCV 88.0  78.0 - 100.0 fL   MCH 28.3  26.0 - 34.0 pg   MCHC 32.1  30.0 - 36.0 g/dL   RDW 40.9  81.1 - 91.4 %   Platelets 485 (*) 150 - 400 K/uL  BASIC METABOLIC PANEL     Status: Abnormal   Collection Time    09/08/12  5:25 AM      Result Value Range   Sodium 135  135 - 145 mEq/L   Potassium 4.3  3.5 - 5.1 mEq/L   Chloride 102  96 - 112 mEq/L   CO2 21  19 - 32 mEq/L   Glucose, Bld 146 (*) 70 - 99 mg/dL   BUN 28 (*) 6 - 23 mg/dL   Creatinine, Ser 7.82 (*) 0.50 - 1.35 mg/dL   Calcium 9.6  8.4 - 95.6 mg/dL   GFR calc non Af Amer 36 (*) >90 mL/min   GFR calc Af Amer 41 (*) >90 mL/min  GLUCOSE, CAPILLARY     Status: Abnormal   Collection Time    09/08/12  7:50 AM      Result Value Range   Glucose-Capillary 112 (*) 70 - 99 mg/dL   Comment 1 Notify RN        Studies/Results: Dg Chest 2 View  09/06/2012   *RADIOLOGY REPORT*  Clinical Data: Fever.  Recent pneumonia.  CHEST - 2 VIEW  Comparison: 08/30/2012.  Findings: Compression fractures of multiple lower thoracic and upper lumbar vertebrae, causing exaggerated kyphosis.  Status post bone augmentation in the upper lumbar spine, likely L1 and L2.  No appreciable change from prior, with sensitivity decreased by advanced osteopenia.  Low lung volumes with interstitial crowding.  No definitive lobar consolidation, effusion, pneumothorax.  No change in mediastinal size contours.  IMPRESSION: No definite change from prior to suggest acute cardiopulmonary disease.  Very low lung volumes decreases sensitivity for detecting pneumonia.   Original Report Authenticated By: Tiburcio Pea   Mr Brain Wo Contrast  09/07/2012   *RADIOLOGY REPORT*  Clinical Data: Encephalopathy.  Myeloma.  Renal disease.  Diabetes.  MRI HEAD WITHOUT CONTRAST  Technique:  Multiplanar, multiecho pulse sequences of the brain and surrounding structures were obtained according to standard protocol without intravenous contrast.  Comparison: CT head 01/27/2012.  Findings: Widespread lytic lesions throughout the skull are redemonstrated, and increased compared with prior CT head.  The largest  lesion is in the left occipital bone, cross section 10 x 27 mm, which has invaded the intracranial compartment but does not significantly compress the brain.  No acute stroke or hemorrhage.  No intra-axial mass lesion or hydrocephalus.  Premature cerebral and cerebellar atrophy.  Slight white matter disease.  No chronic hemorrhage.  Flow voids are maintained in the major intracranial vessels.  Normal pituitary and cerebellar tonsils. Within limits of evaluation on noncontrast MR, no visible venous sinus occlusion.  Heterogeneous upper cervical bone marrow reflecting myeloma  with spondylosis at C3-4 mildly compressing the cord.  Chronic sinusitis.  No mastoid fluid.  Negative orbits.  IMPRESSION: Premature cerebral and cerebellar atrophy.  No acute stroke, hemorrhage, for intracranial inflammatory process.  Progression of osseous myeloma changes in the skull, with a moderate sized deposit left occipital bone with early intracranial extension.  Continued surveillance warranted.   Original Report Authenticated By: Davonna Belling, M.D.    Medications:  Prior to Admission:  Prescriptions prior to admission  Medication Sig Dispense Refill  . amLODipine (NORVASC) 5 MG tablet Take 5 mg by mouth daily.        Marland Kitchen aspirin 81 MG tablet Take 81 mg by mouth daily.      Marland Kitchen atenolol (TENORMIN) 25 MG tablet Take 25 mg by mouth daily.      . bisacodyl (BISACODYL) 5 MG EC tablet Take 15 mg by mouth every other day.       Marland Kitchen dexamethasone (DECADRON) 4 MG tablet TAKE 10 TABLETS (40 MG) ONCE WEEKLY  130 tablet  0  . diphenhydrAMINE (SOMINEX) 25 MG tablet Take 25 mg by mouth at bedtime.      . docusate sodium (COLACE) 100 MG capsule Take 2 capsules by mouth twice daily      . filgrastim (NEUPOGEN) 480 MCG/0.8ML SOLN injection Inject 0.8 mLs (480 mcg total) into the skin as directed.  4 Syringe  1  . HUMALOG 100 UNIT/ML injection Inject into the skin 3 (three) times daily before meals. Sliding scale      . insulin glargine  (LANTUS) 100 UNIT/ML injection Inject 64 Units into the skin at bedtime.       . mineral oil external liquid Apply 1 application topically every other day. Every other day apply to stomach      . oxycodone (OXY-IR) 5 MG capsule Take 5 mg by mouth every 4 (four) hours as needed.      Marland Kitchen  oxyCODONE (OXYCONTIN) 10 MG 12 hr tablet Take 1 tablet (10 mg total) by mouth every 12 (twelve) hours.  60 tablet  0  . POMALYST 4 MG capsule Take 4 mg by mouth daily.       . simvastatin (ZOCOR) 20 MG tablet Take 20 mg by mouth every evening.      . TRAVATAN Z 0.004 % SOLN ophthalmic solution Place 1 drop into both eyes at bedtime.      . valACYclovir (VALTREX) 500 MG tablet Take 1 tablet (500 mg total) by mouth 2 (two) times daily.  14 tablet  1   Scheduled: . amLODipine  5 mg Oral Daily  . aspirin EC  81 mg Oral Daily  . atenolol  25 mg Oral Daily  . bisacodyl  15 mg Oral QODAY  . ceFEPime (MAXIPIME) IV  2 g Intravenous Q24H  . docusate sodium  100 mg Oral BID  . enoxaparin (LOVENOX) injection  40 mg Subcutaneous Q24H  . insulin aspart  0-9 Units Subcutaneous TID WC  . insulin glargine  64 Units Subcutaneous QHS  . mineral oil  10 mL Other QODAY  . OxyCODONE  10 mg Oral BID  . simvastatin  20 mg Oral QPM  . sodium chloride  3 mL Intravenous Q12H  . Travoprost (BAK Free)  1 drop Both Eyes QHS  . valACYclovir  500 mg Oral BID  . vancomycin  1,250 mg Intravenous QHS   Continuous:   Assessment/Plan: Mental status change and low grade temperature in a patient with  myeloma, recent hospitalization for Pseudomonas pneumonia. Patient continues to be at baseline, blood cultures negative, no leukocytosis, is not neutropenic. Marland Kitchen Patient's fever could be the result of previous Pseudomonas pneumonia. He is stable  Now, continue current antibiotics, we will monitor for another 24 hours and followup cultures. As for  the patient's mental status change it   could have have been the result  of fever, unrecognized  hypoglycemia or result of his pain medication. Multiple myeloma, hematology will be notified, his hemoglobin is down to 8, we will allow them to determine threshold for transfusion Hypertension Chronic kidney disease stage III stable   LOS: 2 days   Nita Whitmire D 09/08/2012, 9:17 AM

## 2012-09-09 ENCOUNTER — Other Ambulatory Visit: Payer: Self-pay | Admitting: *Deleted

## 2012-09-09 ENCOUNTER — Inpatient Hospital Stay (HOSPITAL_COMMUNITY): Payer: 59

## 2012-09-09 ENCOUNTER — Telehealth: Payer: Self-pay | Admitting: *Deleted

## 2012-09-09 LAB — CBC
HCT: 25.9 % — ABNORMAL LOW (ref 39.0–52.0)
Hemoglobin: 8.1 g/dL — ABNORMAL LOW (ref 13.0–17.0)
MCHC: 31.3 g/dL (ref 30.0–36.0)
MCV: 87.2 fL (ref 78.0–100.0)

## 2012-09-09 LAB — GLUCOSE, CAPILLARY
Glucose-Capillary: 141 mg/dL — ABNORMAL HIGH (ref 70–99)
Glucose-Capillary: 57 mg/dL — ABNORMAL LOW (ref 70–99)
Glucose-Capillary: 94 mg/dL (ref 70–99)
Glucose-Capillary: 115 mg/dL — ABNORMAL HIGH (ref 70–99)
Glucose-Capillary: 270 mg/dL — ABNORMAL HIGH (ref 70–99)

## 2012-09-09 MED ORDER — INSULIN GLARGINE 100 UNIT/ML ~~LOC~~ SOLN
30.0000 [IU] | Freq: Every day | SUBCUTANEOUS | Status: DC
  Administered 2012-09-09 – 2012-09-10 (×2): 30 [IU] via SUBCUTANEOUS
  Filled 2012-09-09 (×2): qty 0.3

## 2012-09-09 NOTE — Progress Notes (Signed)
Dr Nehemiah Settle called and informed of CBG results.

## 2012-09-09 NOTE — Progress Notes (Signed)
Hypoglycemic Event  CBG: 43 Treatment: juice  Symptoms: Shaky  Follow-up CBG: Time:0805 CBG Result:57  Possible Reasons for Event: unknown  Comments/MD notified:Dr Polite    Dossie Ocanas Ward  Remember to initiate Hypoglycemia Order Set & complete

## 2012-09-09 NOTE — Progress Notes (Signed)
Subjective: Patient was shaky and her most this morning, had  urinary incontinence. Per nursing blood sugar was low, no issues overnight. Patient did have a low-grade temperature.  Objective: Vital signs in last 24 hours: Temp:  [98.9 F (37.2 C)-99.9 F (37.7 C)] 99.9 F (37.7 C) (07/17 2200) Pulse Rate:  [85-96] 96 (07/17 2200) Resp:  [18-20] 20 (07/17 2200) BP: (152-155)/(81) 152/81 mmHg (07/17 2200) SpO2:  [94 %-95 %] 94 % (07/17 2200) Weight change:  Last BM Date: 09/06/12  Intake/Output from previous day: 07/17 0701 - 07/18 0700 In: -  Out: 1100 [Urine:1100] Intake/Output this shift:    General appearance: slightly confused, probably secondary to hypoglycemia Resp: scattered rhonchi in the base Cardio: regular rate and rhythm, S1, S2 normal, no murmur, click, rub or gallop Extremities: pedal edema  Lab Results:  Results for orders placed during the hospital encounter of 09/06/12 (from the past 24 hour(s))  GLUCOSE, CAPILLARY     Status: Abnormal   Collection Time    09/08/12 12:03 PM      Result Value Range   Glucose-Capillary 162 (*) 70 - 99 mg/dL   Comment 1 Notify RN    GLUCOSE, CAPILLARY     Status: Abnormal   Collection Time    09/08/12  4:50 PM      Result Value Range   Glucose-Capillary 157 (*) 70 - 99 mg/dL   Comment 1 Notify RN    GLUCOSE, CAPILLARY     Status: Abnormal   Collection Time    09/08/12  8:51 PM      Result Value Range   Glucose-Capillary 141 (*) 70 - 99 mg/dL  GLUCOSE, CAPILLARY     Status: Abnormal   Collection Time    09/09/12  7:52 AM      Result Value Range   Glucose-Capillary 43 (*) 70 - 99 mg/dL  GLUCOSE, CAPILLARY     Status: Abnormal   Collection Time    09/09/12  8:04 AM      Result Value Range   Glucose-Capillary 57 (*) 70 - 99 mg/dL  CBC     Status: Abnormal   Collection Time    09/09/12  8:15 AM      Result Value Range   WBC 3.9 (*) 4.0 - 10.5 K/uL   RBC 2.97 (*) 4.22 - 5.81 MIL/uL   Hemoglobin 8.1 (*) 13.0 -  17.0 g/dL   HCT 81.1 (*) 91.4 - 78.2 %   MCV 87.2  78.0 - 100.0 fL   MCH 27.3  26.0 - 34.0 pg   MCHC 31.3  30.0 - 36.0 g/dL   RDW 95.6  21.3 - 08.6 %   Platelets 450 (*) 150 - 400 K/uL  GLUCOSE, CAPILLARY     Status: None   Collection Time    09/09/12  8:21 AM      Result Value Range   Glucose-Capillary 94  70 - 99 mg/dL      Studies/Results: Dg Chest 2 View  09/09/2012   *RADIOLOGY REPORT*  Clinical Data: Fever.  Pneumonia.  CHEST - 2 VIEW  Comparison: 09/06/2012.  Findings: Markedly low lung volumes.  Additionally, there is apical lordotic projection.  Low volumes accentuate the cardiopericardial silhouette and pulmonary vasculature.  No gross lobar consolidation.  IMPRESSION: Markedly low volume chest.  Consider repeat PA and lateral when patient can perform study with full inspiration.   Original Report Authenticated By: Andreas Newport, M.D.    Medications:  Prior to Admission:  Prescriptions prior to admission  Medication Sig Dispense Refill  . amLODipine (NORVASC) 5 MG tablet Take 5 mg by mouth daily.        Marland Kitchen aspirin 81 MG tablet Take 81 mg by mouth daily.      Marland Kitchen atenolol (TENORMIN) 25 MG tablet Take 25 mg by mouth daily.      . bisacodyl (BISACODYL) 5 MG EC tablet Take 15 mg by mouth every other day.       Marland Kitchen dexamethasone (DECADRON) 4 MG tablet TAKE 10 TABLETS (40 MG) ONCE WEEKLY  130 tablet  0  . diphenhydrAMINE (SOMINEX) 25 MG tablet Take 25 mg by mouth at bedtime.      . docusate sodium (COLACE) 100 MG capsule Take 2 capsules by mouth twice daily      . filgrastim (NEUPOGEN) 480 MCG/0.8ML SOLN injection Inject 0.8 mLs (480 mcg total) into the skin as directed.  4 Syringe  1  . HUMALOG 100 UNIT/ML injection Inject into the skin 3 (three) times daily before meals. Sliding scale      . insulin glargine (LANTUS) 100 UNIT/ML injection Inject 64 Units into the skin at bedtime.       . mineral oil external liquid Apply 1 application topically every other day. Every other day  apply to stomach      . oxycodone (OXY-IR) 5 MG capsule Take 5 mg by mouth every 4 (four) hours as needed.      Marland Kitchen oxyCODONE (OXYCONTIN) 10 MG 12 hr tablet Take 1 tablet (10 mg total) by mouth every 12 (twelve) hours.  60 tablet  0  . POMALYST 4 MG capsule Take 4 mg by mouth daily.       . simvastatin (ZOCOR) 20 MG tablet Take 20 mg by mouth every evening.      . TRAVATAN Z 0.004 % SOLN ophthalmic solution Place 1 drop into both eyes at bedtime.      . valACYclovir (VALTREX) 500 MG tablet Take 1 tablet (500 mg total) by mouth 2 (two) times daily.  14 tablet  1   Scheduled: . amLODipine  5 mg Oral Daily  . aspirin EC  81 mg Oral Daily  . atenolol  25 mg Oral Daily  . bisacodyl  15 mg Oral QODAY  . ceFEPime (MAXIPIME) IV  2 g Intravenous Q24H  . docusate sodium  100 mg Oral BID  . enoxaparin (LOVENOX) injection  40 mg Subcutaneous Q24H  . insulin aspart  0-9 Units Subcutaneous TID WC  . insulin glargine  64 Units Subcutaneous QHS  . mineral oil  10 mL Other QODAY  . OxyCODONE  10 mg Oral BID  . simvastatin  20 mg Oral QPM  . sodium chloride  3 mL Intravenous Q12H  . Travoprost (BAK Free)  1 drop Both Eyes QHS  . valACYclovir  500 mg Oral BID  . vancomycin  1,250 mg Intravenous QHS   Continuous:   Assessment/Plan: Mental status change and low-grade temperature in a patient with myeloma, with recent hospitalization for Pseudomonas pneumonia. Patient was slightly confused this morning, found to be hypoglycemic. This could have been the cause for his confusion on presentation to the hospital. He continues on broad spectrum antibiotics, he has a low-grade temperature, blood cultures are negative. Followup x-ray will be obtained, as noted above. Working diagnosis for the low-grade temperature at  this time is partially treatment of Pseudomonas pneumonia in a patient with immunocompromise state. Review of results from patient's recent bronchoscopy  in June showed that his Pseudomonas was sensitive  to cefepime and Cipro, fungal cultures were negative, AFB negative. We will continue antibiotics for now, if continued low-grade temperature ID input may be indicated. Multiple myeloma, hemoglobin stable, hematology notified of patient's admission Chronic kidney disease stage III Diabetes, documented hypoglycemia while hospitalized, meds will be adjusted Hypertension   LOS: 3 days   Quincy Prisco D 09/09/2012, 9:10 AM

## 2012-09-09 NOTE — Progress Notes (Signed)
cbg checked 140.

## 2012-09-09 NOTE — Progress Notes (Signed)
Temp 103.1 Dr Nehemiah Settle called and informed.

## 2012-09-09 NOTE — Progress Notes (Signed)
DIAGNOSIS:  Multiple myeloma IgA subtype diagnosed in November, 2009.   PRIOR THERAPY:  1. Status post seven cycles of systemic chemotherapy with Revlimid and low-dose Betatron with partial response. The last dose was given June, 2010. 2. Status post chemotherapy with weekly Velcade in a patient on Decadron 40 mg on a weekly basis, status post 75 weekly doses. The last dose was given May 04, 2008. 3. Status post treatment again with Velcade and Decadron. The patient was awaiting stem cell transplant at [facility]. Last dose was given Jul 16, 2009. 4. The patient was not considered a good candidate for autologous stem cell transplant at that time secondary to poor mobilization of his stem cells.  5. Status post six cycles of systemic chemotherapy with Velcade, Cytoxan and Betatron. Last dose was given March 25, 2010. This continued secondary to his disease progression. 6. Status post ten cycles of systemic chemotherapy with Velcade. Doxil and Decadron. Last dose was given on 10/31/2010. Currently on hold because of concern about cardiac toxicity.  CURRENT THERAPY:  1. Pomalyst 4 mg by mouth daily for 21 days every 28 days, last dose was given 3 weeks ago. 2. Decadron 40 mg by mouth weekly   Subjective: The patient is seen and examined today. His brother was at the bedside. He was recently admitted to [facility] complaining of fever and confusion. He has a recent admission weeks ago with septic shock and respiratory failure secondary to Pseudomonas pneumonia. The patient has been off treatment for around 6 weeks. During his evaluation MRI of the brain was performed and it showed progression of osseous myeloma changes in the skull with moderate sized deposits in the left occipital bone with early intracranial extension. I was consulted today to evaluate the patient and give recommendation regarding his progressive multiple myeloma. He is feeling a little bit better  today but continues to have intermittent fever and chills as well as sweating. He denied having any significant chest pain or shortness breath. He is currently on treatment with cefepime and vancomycin.  Objective: Vital signs in last 24 hours: Temp:  [98.9 F (37.2 C)-99.9 F (37.7 C)] 99.9 F (37.7 C) (07/17 2200) Pulse Rate:  [85-96] 96 (07/17 2200) Resp:  [18-20] 20 (07/17 2200) BP: (152-155)/(81) 152/81 mmHg (07/17 2200) SpO2:  [94 %-95 %] 94 % (07/17 2200)  Intake/Output from previous day: 07/17 0701 - 07/18 0700 In: -  Out: 1100 [Urine:1100] Intake/Output this shift: Total I/O In: -  Out: 300 [Urine:300]  General appearance: alert, cooperative, no distress and Sweating Resp: clear to auscultation bilaterally Cardio: regular rate and rhythm, S1, S2 normal, no murmur, click, rub or gallop GI: soft, non-tender; bowel sounds normal; no masses,  no organomegaly Extremities: extremities normal, atraumatic, no cyanosis or edema  Lab Results:   Recent Labs  09/08/12 0525 09/09/12 0815  WBC 4.3 3.9*  HGB 8.0* 8.1*  HCT 24.9* 25.9*  PLT 485* 450*   BMET  Recent Labs  09/07/12 0555 09/08/12 0525  NA 134* 135  K 4.1 4.3  CL 101 102  CO2 20 21  GLUCOSE 184* 146*  BUN 31* 28*  CREATININE 1.96* 1.90*  CALCIUM 9.7 9.6    Studies/Results: Dg Chest 2 View  09/09/2012   *RADIOLOGY REPORT*  Clinical Data: Fever.  Pneumonia.  CHEST - 2 VIEW  Comparison: 09/06/2012.  Findings: Markedly low lung volumes.  Additionally, there is apical lordotic projection.  Low volumes accentuate the cardiopericardial silhouette and pulmonary  vasculature.  No gross lobar consolidation.  IMPRESSION: Markedly low volume chest.  Consider repeat PA and lateral when patient can perform study with full inspiration.   Original Report Authenticated By: Andreas Newport, M.D.    Medications: I have reviewed the patient's current medications.  Assessment/Plan: This is a very pleasant 67 years old  [place] male with multiple myeloma, most recently treated with several cycles of oral Pomalyst and Decadron. His treatment has been on hold secondary to her recent hospitalization with Pseudomonas pneumonia and septic shock.  MRI of the brain showed evidence for progressive myeloma and the skull. I will consult radiation oncology for evaluation and treatment of this lesion. I will continue to hold his myeloma treatment for now until improvement in his general condition. He has a followup appointment with me in around 10 days for reevaluation and repeat myeloma panel as well as further discussion of his treatment options. Continue current treatment for pneumonia. Thank you so much for allowing me to participate in the care of Mr. Mcdonell, I will continue to follow up the patient with you and assist in his management.  LOS: 3 days    Geni Skorupski K. 09/09/2012

## 2012-09-09 NOTE — Telephone Encounter (Signed)
Pt's wife called concerned wanting to know what the next steps were and whether Dr Donnald Garre would want to order the myeloma panel while pt was in the hospital.  She also wants to know what they will do when he is d/c from the hospital because she feels he will need care but she has to continue to work.  Informed her that the myeloma panel looks like it will be done on an outpt basis but she can ask the attending MD, also advised she ask for a social work, palliative care consult to discuss her social concerns.  She verbalized understanding.  SLJ

## 2012-09-09 NOTE — Progress Notes (Signed)
Hypoglycemic Event  CBG: 57  Treatment: 15 GM carbohydrate snack  Symptoms: Shaky  Follow-up CBG: [id number] CBG Result:94  Possible Reasons for Event: Unknown  Comments/MD notified:Dr Polite     Rodney Green  Remember to initiate Hypoglycemia Order Set & complete

## 2012-09-10 LAB — URINALYSIS W MICROSCOPIC + REFLEX CULTURE
Glucose, UA: NEGATIVE mg/dL
Ketones, ur: NEGATIVE mg/dL
Leukocytes, UA: NEGATIVE
Protein, ur: >300 mg/dL — AB

## 2012-09-10 LAB — CBC
MCH: 28.3 pg (ref 26.0–34.0)
MCHC: 32 g/dL (ref 30.0–36.0)
MCV: 88.6 fL (ref 78.0–100.0)
Platelets: 400 10*3/uL (ref 150–400)
RDW: 15.7 % — ABNORMAL HIGH (ref 11.5–15.5)

## 2012-09-10 LAB — GLUCOSE, CAPILLARY: Glucose-Capillary: 199 mg/dL — ABNORMAL HIGH (ref 70–99)

## 2012-09-10 MED ORDER — SODIUM CHLORIDE 0.9 % IV SOLN
2.5000 mg/kg | INTRAVENOUS | Status: DC
  Administered 2012-09-10 – 2012-09-14 (×5): 205 mg via INTRAVENOUS
  Filled 2012-09-10 (×6): qty 205

## 2012-09-10 NOTE — Progress Notes (Addendum)
ANTIBIOTIC CONSULT NOTE - follow up  Pharmacy Consult for vancomycin, cefepime Indication: Low grade temp, immunocompromised host  Allergies  Allergen Reactions  . Ace Inhibitors     He is not sure about allergy to ace inhibitors    Patient Measurements: Height: 5' 5.5" (166.4 cm) Weight: 180 lb 1.9 oz (81.7 kg) IBW/kg (Calculated) : 62.65  Vital Signs: Temp: 99.8 F (37.7 C) (07/19 0600) Temp src: Oral (07/19 0600) BP: 146/89 mmHg (07/19 0600) Pulse Rate: 93 (07/19 0600) Intake/Output from previous day: 07/18 0701 - 07/19 0700 In: 100 [IV Piggyback:100] Out: 300 [Urine:300] Intake/Output from this shift:    Labs:  Recent Labs  09/08/12 0525 09/09/12 0815 09/10/12 0728  WBC 4.3 3.9*  --   HGB 8.0* 8.1*  --   PLT 485* 450*  --   CREATININE 1.90*  --  1.71*   Estimated Creatinine Clearance: 43.4 ml/min (by C-G formula based on Cr of 1.71). No results found for this basename: VANCOTROUGH, VANCOPEAK, VANCORANDOM, GENTTROUGH, GENTPEAK, GENTRANDOM, TOBRATROUGH, TOBRAPEAK, TOBRARND, AMIKACINPEAK, AMIKACINTROU, AMIKACIN,  in the last 72 hours   Microbiology: Recent Results (from the past 720 hour(s))  CULTURE, BLOOD (ROUTINE X 2)     Status: None   Collection Time    08/14/12  8:45 AM      Result Value Range Status   Specimen Description BLOOD LEFT HAND   Final   Special Requests BOTTLES DRAWN AEROBIC AND ANAEROBIC 4CC EACH   Final   Culture  Setup Time 08/14/2012 17:47   Final   Culture NO GROWTH 5 DAYS   Final   Report Status 08/20/2012 FINAL   Final  CULTURE, BLOOD (ROUTINE X 2)     Status: None   Collection Time    08/14/12  9:00 AM      Result Value Range Status   Specimen Description BLOOD RIGHT HAND   Final   Special Requests BOTTLES DRAWN AEROBIC AND ANAEROBIC 5CC EACH   Final   Culture  Setup Time 08/14/2012 17:47   Final   Culture NO GROWTH 5 DAYS   Final   Report Status 08/20/2012 FINAL   Final  CULTURE, EXPECTORATED SPUTUM-ASSESSMENT     Status:  None   Collection Time    08/14/12  9:42 AM      Result Value Range Status   Specimen Description SPUTUM   Final   Special Requests Immunocompromised   Final   Sputum evaluation     Final   Value: THIS SPECIMEN IS ACCEPTABLE. RESPIRATORY CULTURE REPORT TO FOLLOW.   Report Status 08/14/2012 FINAL   Final  CULTURE, RESPIRATORY (NON-EXPECTORATED)     Status: None   Collection Time    08/14/12  9:42 AM      Result Value Range Status   Specimen Description SPUTUM   Final   Special Requests NONE   Final   Gram Stain     Final   Value: NO WBC SEEN     RARE SQUAMOUS EPITHELIAL CELLS PRESENT     FEW GRAM POSITIVE COCCI IN PAIRS     FEW GRAM POSITIVE RODS   Culture NORMAL OROPHARYNGEAL FLORA   Final   Report Status 08/16/2012 FINAL   Final  URINE CULTURE     Status: None   Collection Time    08/14/12 10:17 AM      Result Value Range Status   Specimen Description URINE, CATHETERIZED   Final   Special Requests NONE   Final   Culture  Setup Time 08/14/2012 15:22   Final   Colony Count 3,000 COLONIES/ML   Final   Culture ENTEROBACTER AEROGENES   Final   Report Status 08/16/2012 FINAL   Final   Organism ID, Bacteria ENTEROBACTER AEROGENES   Final  MRSA PCR SCREENING     Status: None   Collection Time    08/14/12 11:55 AM      Result Value Range Status   MRSA by PCR NEGATIVE  NEGATIVE Final   Comment:            The GeneXpert MRSA Assay (FDA     approved for NASAL specimens     only), is one component of a     comprehensive MRSA colonization     surveillance program. It is not     intended to diagnose MRSA     infection nor to guide or     monitor treatment for     MRSA infections.  RESPIRATORY VIRUS PANEL     Status: Abnormal   Collection Time    08/14/12  2:13 PM      Result Value Range Status   Source - RVPAN NASAL SWAB   Corrected   Comment: CORRECTED ON 06/23 AT 1913: PREVIOUSLY REPORTED AS NASAL SWAB   Respiratory Syncytial Virus A NOT DETECTED   Final   Respiratory  Syncytial Virus B NOT DETECTED   Final   Influenza A NOT DETECTED   Final   Influenza B NOT DETECTED   Final   Parainfluenza 1 NOT DETECTED   Final   Parainfluenza 2 NOT DETECTED   Final   Parainfluenza 3 DETECTED (*)  Final   Metapneumovirus NOT DETECTED   Final   Rhinovirus NOT DETECTED   Final   Adenovirus NOT DETECTED   Final   Influenza A H1 NOT DETECTED   Final   Influenza A H3 NOT DETECTED   Final   Comment: (NOTE)           Normal Reference Range for each Analyte: NOT DETECTED     Testing performed using the Luminex xTAG Respiratory Viral Panel test     kit.     This test was developed and its performance characteristics determined     by [employer] [facility]. It has not been cleared or approved by the [place]     Food and Drug Administration. This test is used for clinical purposes.     It should not be regarded as investigational or for research. This     laboratory is certified under the Clinical Laboratory Improvement     Amendments of 1988 (CLIA) as qualified to perform high complexity     clinical laboratory testing.  AFB CULTURE WITH SMEAR     Status: None   Collection Time    08/15/12  6:24 PM      Result Value Range Status   Specimen Description BRONCHIAL ALVEOLAR LAVAGE   Final   Special Requests Immunocompromised   Final   ACID FAST SMEAR NO ACID FAST BACILLI SEEN   Final   Culture     Final   Value: CULTURE WILL BE EXAMINED FOR 6 WEEKS BEFORE ISSUING A FINAL REPORT   Report Status PENDING   Incomplete  CULTURE, BAL-QUANTITATIVE     Status: None   Collection Time    08/15/12  6:24 PM      Result Value Range Status   Specimen Description BRONCHIAL ALVEOLAR LAVAGE   Final   Special Requests Immunocompromised  Final   Gram Stain     Final   Value: MODERATE WBC PRESENT,BOTH PMN AND MONONUCLEAR     NO SQUAMOUS EPITHELIAL CELLS SEEN     NO ORGANISMS SEEN   Colony Count 300 COLONIES/ML   Final   Culture PSEUDOMONAS AERUGINOSA   Final   Report Status 08/18/2012  FINAL   Final   Organism ID, Bacteria PSEUDOMONAS AERUGINOSA   Final  FUNGUS CULTURE W SMEAR     Status: None   Collection Time    08/15/12  6:24 PM      Result Value Range Status   Specimen Description BRONCHIAL ALVEOLAR LAVAGE   Final   Special Requests Immunocompromised   Final   Fungal Smear NO YEAST OR FUNGAL ELEMENTS SEEN   Final   Culture CULTURE IN PROGRESS FOR FOUR WEEKS   Final   Report Status PENDING   Incomplete  LEGIONELLA CULTURE     Status: None   Collection Time    08/15/12  6:24 PM      Result Value Range Status   Specimen Description BRONCHIAL ALVEOLAR LAVAGE   Final   Special Requests Immunocompromised   Final   Culture NO LEGIONELLA ISOLATED   Final   Report Status 08/21/2012 FINAL   Final  PNEUMOCYSTIS JIROVECI SMEAR BY DFA     Status: None   Collection Time    08/15/12  6:24 PM      Result Value Range Status   Specimen Source-PJSRC BRONCHIAL ALVEOLAR LAVAGE   Final   Pneumocystis jiroveci Ag NEGATIVE   Final   Comment: Performed at [facility]  RESPIRATORY VIRUS PANEL     Status: Abnormal   Collection Time    08/15/12  6:24 PM      Result Value Range Status   Source - RVPAN BRONCHIAL ALVEOLAR LAVAGE   Corrected   Comment: CORRECTED ON 06/24 AT 1801: PREVIOUSLY REPORTED AS BRONCHIAL ALVEOLAR LAVAGE   Respiratory Syncytial Virus A NOT DETECTED   Final   Respiratory Syncytial Virus B NOT DETECTED   Final   Influenza A NOT DETECTED   Final   Influenza B NOT DETECTED   Final   Parainfluenza 1 NOT DETECTED   Final   Parainfluenza 2 NOT DETECTED   Final   Parainfluenza 3 DETECTED (*)  Final   Comment: RESULT CALLED TO, READ BACK BY AND VERIFIED WITH:     Hughes Better RN [id number] 08/17/12 A NAVARRO   Metapneumovirus NOT DETECTED   Final   Rhinovirus NOT DETECTED   Final   Adenovirus NOT DETECTED   Final   Influenza A H1 NOT DETECTED   Final   Influenza A H3 NOT DETECTED   Final   Comment: (NOTE)           Normal Reference Range for each Analyte:  NOT DETECTED     Testing performed using the Luminex xTAG Respiratory Viral Panel test     kit.     This test was developed and its performance characteristics determined     by [facility]. It has not been cleared or approved by the [place]     Food and Drug Administration. This test is used for clinical purposes.     It should not be regarded as investigational or for research. This     laboratory is certified under the Clinical Laboratory Improvement     Amendments of 1988 (CLIA) as qualified to perform high complexity  clinical laboratory testing.  URINE CULTURE     Status: None   Collection Time    08/30/12 10:36 AM      Result Value Range Status   Colony Count 5,000 COLONIES/ML   Final   Organism ID, Bacteria Insignificant Growth   Final  URINE CULTURE     Status: None   Collection Time    09/06/12 10:23 PM      Result Value Range Status   Specimen Description URINE, CLEAN CATCH   Final   Special Requests NONE   Final   Culture  Setup Time 09/07/2012 06:33   Final   Colony Count NO GROWTH   Final   Culture NO GROWTH   Final   Report Status 09/08/2012 FINAL   Final  CULTURE, BLOOD (ROUTINE X 2)     Status: None   Collection Time    09/07/12 12:40 AM      Result Value Range Status   Specimen Description BLOOD LEFT ARM   Final   Special Requests BOTTLES DRAWN AEROBIC AND ANAEROBIC    Final   Culture  Setup Time 09/07/2012 06:31   Final   Culture     Final   Value:        BLOOD CULTURE RECEIVED NO GROWTH TO DATE CULTURE WILL BE HELD FOR 5 DAYS BEFORE ISSUING A FINAL NEGATIVE REPORT   Report Status PENDING   Incomplete  CULTURE, BLOOD (ROUTINE X 2)     Status: None   Collection Time    09/07/12 12:45 AM      Result Value Range Status   Specimen Description BLOOD LEFT HAND   Final   Special Requests BOTTLES DRAWN AEROBIC ONLY    Final   Culture  Setup Time 09/07/2012 06:32   Final   Culture     Final   Value:        BLOOD CULTURE RECEIVED NO GROWTH TO DATE  CULTURE WILL BE HELD FOR 5 DAYS BEFORE ISSUING A FINAL NEGATIVE REPORT   Report Status PENDING   Incomplete    Medical History: Past Medical History  Diagnosis Date  . Allergy   . Diabetes mellitus   . Hypertension   . Hepatitis C   . Multiple myeloma   . Multiple myeloma   . Chronic kidney disease   . Shortness of breath     Medications:  Anti-infectives   Start     Dose/Rate Route Frequency Ordered Stop   09/07/12 2200  vancomycin (VANCOCIN) 1,250 mg in sodium chloride 0.9 % 250 mL IVPB     1,250 mg 166.7 mL/hr over 90 Minutes Intravenous Daily at bedtime 09/07/12 0531     09/07/12 1200  ceFEPIme (MAXIPIME) 2 g in dextrose 5 % 50 mL IVPB     2 g 100 mL/hr over 30 Minutes Intravenous Every 24 hours 09/07/12 0531     09/07/12 0200  vancomycin (VANCOCIN) IVPB 1000 mg/200 mL premix     1,000 mg 200 mL/hr over 60 Minutes Intravenous  Once 09/07/12 0155 09/07/12 0350   09/07/12 0145  valACYclovir (VALTREX) tablet 500 mg     500 mg Oral 2 times daily 09/07/12 0141     09/07/12 0015  ceFEPIme (MAXIPIME) 1 g in dextrose 5 % 50 mL IVPB     1 g 100 mL/hr over 30 Minutes Intravenous  Once 09/07/12 0010 09/07/12 0111     Assessment: Rodney Green, Rodney Green.  Recent admission for septic shock and respiratory failure secondary to pseudomonas pneumonia  D4 Vancomycin 1250mg  IV q24h/Cefepime 2g IV q24h  WBC down below nl, 3.9  Scr 1.7, Hx CKD, Crcl ~ 36ml/min  Tm 99.8  Ucx 7/15 NG Final  Bcx x 2 7/15 NGTD  Goal of Therapy:  Vancomycin trough level 15-20 mcg/ml Cefepime dosed based on patient weight and renal function  Plan:  Continue current doses of Vanc/Cefepime Duration of therapy and abx narrowing per MD  Gwen Her PharmD  [phone] 09/10/2012 9:41 AM

## 2012-09-10 NOTE — Progress Notes (Signed)
Thank you for consulting the Palliative Medicine Team at [facility] to meet your patient's and family's needs.   The reason that you asked [place] to see your patient is for Goals of Care, Symptom Management, Care Coordination   We have scheduled your patient for a meeting: 7/20 Sunday at 9AM  The Surrogate decision make is:Wife Hope Ramaswamy Contact information:Lab at [facility] other numbers on chart  Other family members that need to be present: none    Your patient is able/unable to participate:yes  Additional Narrative:  67 yo with Multiple Myeloma, recent decline, concerns about safety at home. Disease progression on MRI.   Anderson Malta, DO Palliative Medicine

## 2012-09-10 NOTE — Consult Note (Addendum)
[facility]  Total days of antibiotics 5        Day 5 vancomycin        Day 5 piptazo        Day 3 valtrex               Reason for Consult: intermittent fevers in immunocompromised host    Referring Physician: polite  Principal Problem:   Fever Active Problems:   Multiple myeloma in relapse   Hypertension   Stage III chronic kidney disease   Acute encephalopathy    HPI: Rodney Green is a 67 y.o. male with multiple myeloma IgA subtype, dx in 2009, has had numerous chemoregimen, including revlimid, velcade +decadron + doxil in 2011, most recently on domalyst + decadron which has been on hold until end of July 2014 due to recent hospitalization on 6/22-30 for septic shock thought to be due to pseudomonas-para influenza 3 coinfection causing his acute respiratory failure. He was discharged on 14 days of cipro to finish 21day course of therapy which he finished on 7/14. He still has had ongoing intermittent fevers. He presented to the [facility] on 7/14 for further management of fevers and malaise. It appears that his work up from his hospitalization in June found that he had detectable CMV virus, but unquantitated. He still has intermittent fevers up to 101.2, including today despite broad spectrum antibiotics of vancomycin and cefepime for the last 5 days. He is infectious work up thus far is unremarkable, blood and urine cultures are negative. There appears to be increase protein in his UA suggestive of his underlying recurrent multiple myeloma    Past Medical History  Diagnosis Date  . Allergy   . Diabetes mellitus   . Hypertension   . Hepatitis C   . Multiple myeloma   . Multiple myeloma   . Chronic kidney disease   . Shortness of breath     Allergies:  Allergies  Allergen Reactions  . Ace Inhibitors     He is not sure about allergy to ace inhibitors    MEDICATIONS: . amLODipine  5 mg Oral Daily  . aspirin EC  81 mg Oral Daily  . atenolol  25  mg Oral Daily  . bisacodyl  15 mg Oral QODAY  . ceFEPime (MAXIPIME) IV  2 g Intravenous Q24H  . docusate sodium  100 mg Oral BID  . enoxaparin (LOVENOX) injection  40 mg Subcutaneous Q24H  . insulin aspart  0-9 Units Subcutaneous TID WC  . insulin glargine  30 Units Subcutaneous QHS  . mineral oil  10 mL Other QODAY  . OxyCODONE  10 mg Oral BID  . simvastatin  20 mg Oral QPM  . sodium chloride  3 mL Intravenous Q12H  . Travoprost (BAK Free)  1 drop Both Eyes QHS  . valACYclovir  500 mg Oral BID  . vancomycin  1,250 mg Intravenous QHS    History  Substance Use Topics  . Smoking status: Former Smoker -- 0.50 packs/day for 44 years    Types: Cigarettes    Quit date: 01/24/2008  . Smokeless tobacco: Never Used  . Alcohol Use: No     Comment: occ    Family History  Problem Relation Age of Onset  . Cancer Sister   . Diabetes Other   . Hypertension Other      Review of Systems  Constitutional: Negative for fever, chills, diaphoresis, activity change, appetite change, fatigue and unexpected  weight change.  HENT: Negative for congestion, sore throat, rhinorrhea, sneezing, trouble swallowing and sinus pressure.  Eyes: Negative for photophobia and visual disturbance.  Respiratory: Negative for cough, chest tightness, shortness of breath, wheezing and stridor.  Cardiovascular: Negative for chest pain, palpitations and leg swelling.  Gastrointestinal: Negative for nausea, vomiting, abdominal pain, diarrhea, constipation, blood in stool, abdominal distention and anal bleeding.  Genitourinary: Negative for dysuria, hematuria, flank pain and difficulty urinating.  Musculoskeletal: Negative for myalgias, back pain, joint swelling, arthralgias and gait problem.  Skin: Negative for color change, pallor, rash and wound.  Neurological: Negative for dizziness, tremors, weakness and light-headedness.  Hematological: Negative for adenopathy. Does not bruise/bleed easily.    Psychiatric/Behavioral: Negative for behavioral problems, confusion, sleep disturbance, dysphoric mood, decreased concentration and agitation.     OBJECTIVE: Temp:  [98.5 F (36.9 C)-101.1 F (38.4 C)] 98.5 F (36.9 C) (07/19 1336) Pulse Rate:  [77-98] 88 (07/19 1336) Resp:  [18] 18 (07/19 1336) BP: (146-150)/(82-94) 149/94 mmHg (07/19 1336) SpO2:  [94 %-96 %] 96 % (07/19 1336)  Physical Exam  Constitutional: He is oriented to person, place, and time. He appears well-developed and well-nourished. No distress.  HENT:  Mouth/Throat: Oropharynx is clear and moist. No oropharyngeal exudate.  Cardiovascular: Normal rate, regular rhythm and normal heart sounds. Exam reveals no gallop and no friction rub.  No murmur heard.  Pulmonary/Chest: Effort normal and breath sounds normal. No respiratory distress. He has no wheezes.  Abdominal: Soft. Bowel sounds are normal. He exhibits no distension. There is no tenderness.  Lymphadenopathy:  He has no cervical adenopathy.  Neurological: He is alert and oriented to person, place, and time.  Skin: Skin is warm and dry. No rash noted. No erythema.  Psychiatric: He has a normal mood and affect. His behavior is normal.    LABS: Results for orders placed during the hospital encounter of 09/06/12 (from the past 48 hour(s))  GLUCOSE, CAPILLARY     Status: Abnormal   Collection Time    09/08/12  8:51 PM      Result Value Range   Glucose-Capillary 141 (*) 70 - 99 mg/dL  GLUCOSE, CAPILLARY     Status: Abnormal   Collection Time    09/09/12  7:52 AM      Result Value Range   Glucose-Capillary 43 (*) 70 - 99 mg/dL  GLUCOSE, CAPILLARY     Status: Abnormal   Collection Time    09/09/12  8:04 AM      Result Value Range   Glucose-Capillary 57 (*) 70 - 99 mg/dL  CBC     Status: Abnormal   Collection Time    09/09/12  8:15 AM      Result Value Range   WBC 3.9 (*) 4.0 - 10.5 K/uL   RBC 2.97 (*) 4.22 - 5.81 MIL/uL   Hemoglobin 8.1 (*) 13.0 - 17.0  g/dL   HCT 16.1 (*) 09.6 - 04.5 %   MCV 87.2  78.0 - 100.0 fL   MCH 27.3  26.0 - 34.0 pg   MCHC 31.3  30.0 - 36.0 g/dL   RDW 40.9  81.1 - 91.4 %   Platelets 450 (*) 150 - 400 K/uL  GLUCOSE, CAPILLARY     Status: None   Collection Time    09/09/12  8:21 AM      Result Value Range   Glucose-Capillary 94  70 - 99 mg/dL  GLUCOSE, CAPILLARY     Status: Abnormal   Collection Time  09/09/12  9:32 AM      Result Value Range   Glucose-Capillary 140 (*) 70 - 99 mg/dL   Comment 1 Notify RN    GLUCOSE, CAPILLARY     Status: Abnormal   Collection Time    09/09/12 11:20 AM      Result Value Range   Glucose-Capillary 115 (*) 70 - 99 mg/dL   Comment 1 Notify RN    GLUCOSE, CAPILLARY     Status: Abnormal   Collection Time    09/09/12  4:48 PM      Result Value Range   Glucose-Capillary 192 (*) 70 - 99 mg/dL   Comment 1 Notify RN    GLUCOSE, CAPILLARY     Status: Abnormal   Collection Time    09/09/12  9:13 PM      Result Value Range   Glucose-Capillary 270 (*) 70 - 99 mg/dL   Comment 1 Notify RN    CBC     Status: Abnormal   Collection Time    09/10/12  5:00 AM      Result Value Range   WBC 3.1 (*) 4.0 - 10.5 K/uL   RBC 2.54 (*) 4.22 - 5.81 MIL/uL   Hemoglobin 7.2 (*) 13.0 - 17.0 g/dL   HCT 21.3 (*) 08.6 - 57.8 %   MCV 88.6  78.0 - 100.0 fL   MCH 28.3  26.0 - 34.0 pg   MCHC 32.0  30.0 - 36.0 g/dL   RDW 46.9 (*) 62.9 - 52.8 %   Platelets 400  150 - 400 K/uL  CREATININE, SERUM     Status: Abnormal   Collection Time    09/10/12  7:28 AM      Result Value Range   Creatinine, Ser 1.71 (*) 0.50 - 1.35 mg/dL   GFR calc non Af Amer 41 (*) >90 mL/min   GFR calc Af Amer 47 (*) >90 mL/min   Comment:            The eGFR has been calculated     using the CKD EPI equation.     This calculation has not been     validated in all clinical     situations.     eGFR's persistently     <90 mL/min signify     possible Chronic Kidney Disease.  GLUCOSE, CAPILLARY     Status: Abnormal    Collection Time    09/10/12  7:54 AM      Result Value Range   Glucose-Capillary 105 (*) 70 - 99 mg/dL  GLUCOSE, CAPILLARY     Status: Abnormal   Collection Time    09/10/12 11:33 AM      Result Value Range   Glucose-Capillary 134 (*) 70 - 99 mg/dL  GLUCOSE, CAPILLARY     Status: Abnormal   Collection Time    09/10/12  4:49 PM      Result Value Range   Glucose-Capillary 199 (*) 70 - 99 mg/dL    MICRO: 4/13 blood cx x 2 NGTD 7/15 urine cx NGTD IMAGING: Dg Chest 2 View  09/09/2012   *RADIOLOGY REPORT*  Clinical Data: Fever.  Pneumonia.  CHEST - 2 VIEW  Comparison: 09/06/2012.  Findings: Markedly low lung volumes.  Additionally, there is apical lordotic projection.  Low volumes accentuate the cardiopericardial silhouette and pulmonary vasculature.  No gross lobar consolidation.  IMPRESSION: Markedly low volume chest.  Consider repeat PA and lateral when patient can perform study with full inspiration.  Original Report Authenticated By: Andreas Newport, M.D.    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  - will check CMV viral load - will empirically start gancyclovir, per pharmacy to dose, likely reduced dose due to CrCl. And discontinue valtrex - will need to watch for leukopenia associated with its side effect - please pan culture with blood cx and urine cx and ua today - would complete 8 day course of  cefepime for HCAP coverage, but will discontinue vanco  Zuleyma Scharf B. [name] MD MPH [facility] [phone]

## 2012-09-10 NOTE — Progress Notes (Signed)
Subjective:  without c/o Still low grade fever   Objective: Vital signs in last 24 hours: Temp:  [98.6 F (37 C)-99.8 F (37.7 C)] 99.8 F (37.7 C) (07/19 0600) Pulse Rate:  [74-93] 93 (07/19 0600) Resp:  [18] 18 (07/19 0600) BP: (125-150)/(79-89) 146/89 mmHg (07/19 0600) SpO2:  [94 %-96 %] 96 % (07/19 0600) Weight change:  Last BM Date: 09/06/12  Intake/Output from previous day: 07/18 0701 - 07/19 0700 In: 100 [IV Piggyback:100] Out: 300 [Urine:300] Intake/Output this shift:    General appearance: alert Resp: clear to auscultation bilaterally Cardio: regular rate and rhythm GI: soft, non-tender; bowel sounds normal; no masses,  no organomegaly  Lab Results:  Recent Labs  09/08/12 0525 09/09/12 0815  WBC 4.3 3.9*  HGB 8.0* 8.1*  HCT 24.9* 25.9*  PLT 485* 450*   BMET  Recent Labs  09/08/12 0525  NA 135  K 4.3  CL 102  CO2 21  GLUCOSE 146*  BUN 28*  CREATININE 1.90*  CALCIUM 9.6    Studies/Results: Dg Chest 2 View  09/09/2012   *RADIOLOGY REPORT*  Clinical Data: Fever.  Pneumonia.  CHEST - 2 VIEW  Comparison: 09/06/2012.  Findings: Markedly low lung volumes.  Additionally, there is apical lordotic projection.  Low volumes accentuate the cardiopericardial silhouette and pulmonary vasculature.  No gross lobar consolidation.  IMPRESSION: Markedly low volume chest.  Consider repeat PA and lateral when patient can perform study with full inspiration.   Original Report Authenticated By: Andreas Newport, M.D.    Medications: I have reviewed the patient's current medications.  Assessment/Plan: Mental status change and low-grade temperature in a patient with myeloma, with recent hospitalization for Pseudomonas pneumonia. - confusion is improved.; s/p Bronchoscopy in recent past with no fungal. - Fever could be from MM. Marland Kitchen We will continue antibiotics for now, if continued low-grade temperature ID input may be indicated.  Multiple myeloma, hemoglobin stable,  hematology following - recheck in am- no pain Chronic kidney disease stage III - stable Lytic Lesion on skull- MM- Oncology input noted- for Rad onc consult Diabetes, documented hypoglycemia while hospitalized, sugar better now Hypertension - stable Palliative consult per wife request.   LOS: 4 days   Grayer Sproles 09/10/2012, 8:14 AM

## 2012-09-11 DIAGNOSIS — R509 Fever, unspecified: Secondary | ICD-10-CM

## 2012-09-11 DIAGNOSIS — R05 Cough: Secondary | ICD-10-CM

## 2012-09-11 DIAGNOSIS — J189 Pneumonia, unspecified organism: Secondary | ICD-10-CM

## 2012-09-11 DIAGNOSIS — G9340 Encephalopathy, unspecified: Secondary | ICD-10-CM

## 2012-09-11 DIAGNOSIS — Z515 Encounter for palliative care: Secondary | ICD-10-CM

## 2012-09-11 LAB — CBC
Hemoglobin: 8 g/dL — ABNORMAL LOW (ref 13.0–17.0)
MCH: 28 pg (ref 26.0–34.0)
MCHC: 31.9 g/dL (ref 30.0–36.0)
Platelets: 429 10*3/uL — ABNORMAL HIGH (ref 150–400)
RDW: 15.6 % — ABNORMAL HIGH (ref 11.5–15.5)

## 2012-09-11 LAB — GLUCOSE, CAPILLARY: Glucose-Capillary: 77 mg/dL (ref 70–99)

## 2012-09-11 LAB — BASIC METABOLIC PANEL
Calcium: 9.9 mg/dL (ref 8.4–10.5)
GFR calc Af Amer: 49 mL/min — ABNORMAL LOW (ref >90)
GFR calc non Af Amer: 42 mL/min — ABNORMAL LOW (ref >90)
Glucose, Bld: 94 mg/dL (ref 70–99)
Sodium: 136 mEq/L (ref 135–145)

## 2012-09-11 MED ORDER — INSULIN GLARGINE 100 UNIT/ML ~~LOC~~ SOLN
20.0000 [IU] | Freq: Every day | SUBCUTANEOUS | Status: DC
  Administered 2012-09-11 – 2012-09-15 (×5): 20 [IU] via SUBCUTANEOUS
  Filled 2012-09-11 (×7): qty 0.2

## 2012-09-11 MED ORDER — LEVETIRACETAM 250 MG PO TABS
250.0000 mg | ORAL_TABLET | Freq: Two times a day (BID) | ORAL | Status: DC
  Administered 2012-09-11 – 2012-09-20 (×18): 250 mg via ORAL
  Filled 2012-09-11 (×20): qty 1

## 2012-09-11 NOTE — Progress Notes (Signed)
Subjective: Intermittent confusion/ tremor ID and pallative consult noted  Objective: Vital signs in last 24 hours: Temp:  [98.2 F (36.8 C)-101.3 F (38.5 C)] 99.8 F (37.7 C) (07/20 0600) Pulse Rate:  [86-98] 86 (07/20 0600) Resp:  [18-20] 20 (07/20 0600) BP: (149-163)/(87-97) 157/87 mmHg (07/20 0600) SpO2:  [95 %-96 %] 95 % (07/20 0600) Weight change:  Last BM Date: 09/06/12  Intake/Output from previous day: 07/19 0701 - 07/20 0700 In: 243 [P.O.:240; I.V.:3] Out: -  Intake/Output this shift:    General appearance: alert Resp: clear to auscultation bilaterally Cardio: regular rate and rhythm  Lab Results:  Recent Labs  09/10/12 0500 09/11/12 0519  WBC 3.1* 3.6*  HGB 7.2* 8.0*  HCT 22.5* 25.1*  PLT 400 429*   BMET  Recent Labs  09/10/12 0728 09/11/12 0519  NA  --  136  K  --  4.2  CL  --  101  CO2  --  22  GLUCOSE  --  94  BUN  --  25*  CREATININE 1.71* 1.66*  CALCIUM  --  9.9    Studies/Results: No results found.  Medications: I have reviewed the patient's current medications.  Assessment/Plan: Mental status change and low-grade temperature in a patient with myeloma, with recent hospitalization for Pseudomonas pneumonia. - confusion is improved.; s/p Bronchoscopy in recent past with no fungal. - Fever - Etiology unclear- ID input appreciated- on Ganciclovir CMV viral load pending . We will continue antibiotics for now, per ID WBC stable- neutropenia Multiple myeloma, hemoglobin stable, hematology following - Chronic kidney disease stage III - stable  Lytic Lesion on skull- MM- Oncology input noted- for Rad onc consult  Diabetes, documented hypoglycemia while hospitalized, sugar better now  Hypertension - stable  Palliative consult done- with goal discussed- see pallative consult note.   LOS: 5 days   Margrit Minner 09/11/2012, 9:42 AM

## 2012-09-11 NOTE — Care Management Note (Signed)
    Page 1 of 1   09/11/2012     5:17:32 PM   CARE MANAGEMENT NOTE 09/11/2012  Patient:  Rodney Green, Rodney Green   Account Number:  [id number]  Date Initiated:  09/07/2012  Documentation initiated by:  [facility]  Subjective/Objective Assessment:   67 year old male with hx multiple myeloma admitted with fever and confusion.     Action/Plan:   From home with spouse.   Anticipated DC Date:  09/12/2012   Anticipated DC Plan:  HOME/SELF CARE      DC Planning Services  CM consult      Choice offered to / List presented to:             Status of service:  In process, will continue to follow Medicare Important Message given?  NA - LOS <3 / Initial given by admissions (If response is "NO", the following Medicare IM given date fields will be blank) Date Medicare IM given:   Date Additional Medicare IM given:    Discharge Disposition:    Per UR Regulation:  Reviewed for med. necessity/level of care/duration of stay  If discussed at Long Length of Stay Meetings, dates discussed:    Comments:  09/11/12 Bevelyn Arriola RN,BSN NCM [phone] ASKEDBY DR.GOLDING TO PROVIDE SPOUSE W/VA TEL#'S SINCE PATIENT MAY HAVE VA BENEFITS.SPOKETO SPOUSE HOPE VIA TELEPHONE([facility])PROVIDED HERW/[place] [facility] TEL# [phone]/[facility] [place] [phone] TO CHECK FOR VA SERVICE CONNECTION.CONCERNED ABOUT  ?HOSPICE SERVICES.

## 2012-09-11 NOTE — Consult Note (Addendum)
Palliative Medicine Team at [facility]  Date: 09/11/2012   Patient Name: Rodney Green  DOB: 1945/08/23  MRN: [id number]  Age / Sex: 67 y.o., male   PCP: Katy Apo, MD Referring Physician: Katy Apo, MD  HPI/Reason for Consultation: 67 yo man with Multiple Myeloma with recent disease progression including extracranial extension of osseous lesions and increase in urinary proteins. He has been on multiple regimens of chemotherapy since 2009 with his last treatment being Pomalyst and decadron (last dose was 08/17/2012) He was admitted on 7/15 with AMS, fevers and tremor and has received empiric treatment for HCAP. Temp spike to 103, but these have been occuring since 06/2012 per the records and have been associated with chemo dosing. Additionally tremors have been occuring for the past few months as well.  Family requesting palliative care consultation for goals of care and assistance with resources.   Participants in Discussion: Patient and his wife Rodney Green.  Goals/Summary of Discussion:  1. Code Status:  DNR detailed discussion, wife feels that patient may not fully understand the DNR issue-she specifically requests I document DNR on chart and that DNR is in line with his previously stated wishes and his living will. It is also my strong medical opinion that in the event of cardiac arrest we not subject him to intubation, CPR and defibrillation which would make things much worse for him.   2. Scope of Treatment:  Patient and Wife desire Full scope medical treatment.  Questions Affecting Goal Setting and Options for Care:  Main question that remains unknown is -is his functional status decline, fever and fluctuating mental status changes and confusion related to a reversible condition- ie. Infection related- even if viral encephalopathy what are chances for neurological improvement?  Currently he is on empiric Gancyclovir while labs are pending- possible CMV.  Will  complete empiric HCAP abx regimen  What is the status of his myeloma?  Multiple chemo regimens in past  Recent serious complications from chemo and myeloma have led to hospitalization and ICU interventions.  Wife is specifically requesting I add on a myeloma panel which will be reasonable for me to review in making prognostic projections. I will also discuss with Dr. Donnald Garre on Monday.   If there is a high probability of continued neurological decline and worsening functional status limitations we would be looking at a situation where Hospice is appropriate, either at home or a residential hospice facility. However if treatment is reasonable and will contribute to his QOL then that pathway should be explored.   We discussed continuing currently with very aggressive evaluation and treatment along with comfort measures such as liberalizing his diet, reducing CBG sticks, dc'ing tele and controlling his tremor and other uncomfortable symptoms.  Wife mostly concerned about resources, patient wants to be at home, but she works and he needs close supervision when she is working- 4 days a week during the day most difficult times. His fluctuating mental status makes this very difficult.  Our team will continue to assist this patient and his family.  Started on very low dose Keppra for seizure prophylaxis- he has an intermittent tremor  Time: 70 minutes Greater than 50%  of this time was spent counseling and coordinating care related to the above assessment and plan.  Signed by: Edsel Petrin, DO  09/11/2012, 10:04 AM  Please contact Palliative Medicine Team phone at [phone] for questions and concerns.

## 2012-09-11 NOTE — Progress Notes (Signed)
[facility]    Date of Admission:  09/06/2012   Total days of antibiotics 6        Day 6 cefepime        Day 2 ganciclovir        (finished 5 days of vanco)   ID: Rodney Green is a 67 y.o. 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  Medications:  . amLODipine  5 mg Oral Daily  . aspirin EC  81 mg Oral Daily  . atenolol  25 mg Oral Daily  . bisacodyl  15 mg Oral QODAY  . ceFEPime (MAXIPIME) IV  2 g Intravenous Q24H  . docusate sodium  100 mg Oral BID  . enoxaparin (LOVENOX) injection  40 mg Subcutaneous Q24H  . ganciclovir (CYTOVENE) IV  2.5 mg/kg Intravenous Q24H  . insulin aspart  0-9 Units Subcutaneous TID WC  . insulin glargine  30 Units Subcutaneous QHS  . mineral oil  10 mL Other QODAY  . OxyCODONE  10 mg Oral BID  . simvastatin  20 mg Oral QPM  . sodium chloride  3 mL Intravenous Q12H  . Travoprost (BAK Free)  1 drop Both Eyes QHS    Objective: Vital signs in last 24 hours: Temp:  [98.2 F (36.8 C)-101.3 F (38.5 C)] 99.8 F (37.7 C) (07/20 0600) Pulse Rate:  [86-93] 86 (07/20 0600) Resp:  [18-20] 20 (07/20 0600) BP: (149-163)/(87-97) 157/87 mmHg (07/20 0600) SpO2:  [95 %-96 %] 95 % (07/20 0600)  Constitutional: He is oriented to person, place, and time. He appears well-developed and well-nourished. No distress.  HENT:  Mouth/Throat: Oropharynx is clear and moist. No oropharyngeal exudate.  Cardiovascular: Normal rate, regular rhythm and normal heart sounds. Exam reveals no gallop and no friction rub.  No murmur heard.  Pulmonary/Chest: Effort normal and breath sounds normal. No respiratory distress. He has no wheezes.    Abdominal: Soft. Bowel sounds are normal. He exhibits no distension. There is no tenderness.  Lymphadenopathy:  no cervical adenopathy.  Neurological: He is alert and oriented to person, place, and time.  Skin: Skin is warm and dry. No rash noted. No erythema.  Psychiatric: He has a normal mood and affect. His behavior is normal.    Lab Results  Recent Labs  09/10/12 0500 09/10/12 0728 09/11/12 0519  WBC 3.1*  --  3.6*  HGB 7.2*  --  8.0*  HCT 22.5*  --  25.1*  NA  --   --  136  K  --   --  4.2  CL  --   --  101  CO2  --   --  22  BUN  --   --  25*  CREATININE  --  1.71* 1.66*    Microbiology: 7/19 blood cx x 2 PENDING 7/16 blood cx x 2 NGTD 7/15 urine cx NGTD  Studies/Results: No results found.   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   - CMV viral load pending - on day #2 gancyclovir, presuming that CMV viremia has been the source of his  fevers. We will dose per pharmacy protocol, likely reduced dose due to CrCl. Can likely change to an oral regimen as an outpatient pending what goals of care will be for the patient - will need to watch for leukopenia associated with its side effect  - would complete 8 day course of cefepime for HCAP coverage  Enedina Finner to provider other recs on Monday   [facility], [facility] Cell: [phone] Pager: [phone]  09/11/2012, 10:04 AM

## 2012-09-12 ENCOUNTER — Other Ambulatory Visit: Payer: Self-pay

## 2012-09-12 ENCOUNTER — Encounter: Payer: Self-pay | Admitting: Radiation Oncology

## 2012-09-12 ENCOUNTER — Ambulatory Visit
Admit: 2012-09-12 | Discharge: 2012-09-12 | Disposition: A | Discharge status: Home / Self Care | Payer: 59 | Attending: Radiation Oncology | Admitting: Radiation Oncology

## 2012-09-12 DIAGNOSIS — J151 Pneumonia due to Pseudomonas: Secondary | ICD-10-CM

## 2012-09-12 DIAGNOSIS — C9002 Multiple myeloma in relapse: Secondary | ICD-10-CM

## 2012-09-12 MED ORDER — ALPRAZOLAM 0.5 MG PO TABS
0.5000 mg | ORAL_TABLET | Freq: Two times a day (BID) | ORAL | Status: DC | PRN
Start: 2012-09-12 — End: 2012-09-16
  Administered 2012-09-15 – 2012-09-16 (×2): 0.5 mg via ORAL
  Filled 2012-09-12 (×2): qty 1

## 2012-09-12 NOTE — Progress Notes (Signed)
Subjective: Patient looks much better today. Currently he is afebrile, denies any hypoglycemia. He's alert and oriented. Infectious disease input greatly appreciated.  Objective: Vital signs in last 24 hours: Temp:  [98.4 F (36.9 C)-103 F (39.4 C)] 98.6 F (37 C) (07/21 0649) Pulse Rate:  [82-102] 82 (07/21 0649) Resp:  [18-20] 18 (07/21 0649) BP: (134-148)/(84-90) 134/84 mmHg (07/21 0649) SpO2:  [93 %-97 %] 94 % (07/21 0649) Weight change:  Last BM Date: 09/09/12  Intake/Output from previous day: 07/20 0701 - 07/21 0700 In: 120 [P.O.:120] Out: 300 [Urine:300] Intake/Output this shift:    General appearance: alert and cooperative Resp: few basilar crackles otherwise clear to auscultation Cardio: regular rate and rhythm, S1, S2 normal, no murmur, click, rub or gallop Extremities: pedal edema  Lab Results:  No results found for this or any previous visit (from the past 24 hour(s)).    Studies/Results: No results found.  Medications:  Prior to Admission:  Prescriptions prior to admission  Medication Sig Dispense Refill  . amLODipine (NORVASC) 5 MG tablet Take 5 mg by mouth daily.        Marland Kitchen aspirin 81 MG tablet Take 81 mg by mouth daily.      Marland Kitchen atenolol (TENORMIN) 25 MG tablet Take 25 mg by mouth daily.      . bisacodyl (BISACODYL) 5 MG EC tablet Take 15 mg by mouth every other day.       Marland Kitchen dexamethasone (DECADRON) 4 MG tablet TAKE 10 TABLETS (40 MG) ONCE WEEKLY  130 tablet  0  . diphenhydrAMINE (SOMINEX) 25 MG tablet Take 25 mg by mouth at bedtime.      . docusate sodium (COLACE) 100 MG capsule Take 2 capsules by mouth twice daily      . filgrastim (NEUPOGEN) 480 MCG/0.8ML SOLN injection Inject 0.8 mLs (480 mcg total) into the skin as directed.  4 Syringe  1  . HUMALOG 100 UNIT/ML injection Inject into the skin 3 (three) times daily before meals. Sliding scale      . insulin glargine (LANTUS) 100 UNIT/ML injection Inject 64 Units into the skin at bedtime.       .  mineral oil external liquid Apply 1 application topically every other day. Every other day apply to stomach      . oxycodone (OXY-IR) 5 MG capsule Take 5 mg by mouth every 4 (four) hours as needed.      Marland Kitchen oxyCODONE (OXYCONTIN) 10 MG 12 hr tablet Take 1 tablet (10 mg total) by mouth every 12 (twelve) hours.  60 tablet  0  . POMALYST 4 MG capsule Take 4 mg by mouth daily.       . simvastatin (ZOCOR) 20 MG tablet Take 20 mg by mouth every evening.      . TRAVATAN Z 0.004 % SOLN ophthalmic solution Place 1 drop into both eyes at bedtime.      . valACYclovir (VALTREX) 500 MG tablet Take 1 tablet (500 mg total) by mouth 2 (two) times daily.  14 tablet  1   Scheduled: . amLODipine  5 mg Oral Daily  . aspirin EC  81 mg Oral Daily  . atenolol  25 mg Oral Daily  . bisacodyl  15 mg Oral QODAY  . ceFEPime (MAXIPIME) IV  2 g Intravenous Q24H  . docusate sodium  100 mg Oral BID  . enoxaparin (LOVENOX) injection  40 mg Subcutaneous Q24H  . ganciclovir (CYTOVENE) IV  2.5 mg/kg Intravenous Q24H  . insulin glargine  20 Units Subcutaneous QHS  . levETIRAcetam  250 mg Oral BID  . mineral oil  10 mL Other QODAY  . OxyCODONE  10 mg Oral BID  . simvastatin  20 mg Oral QPM  . sodium chloride  3 mL Intravenous Q12H  . Travoprost (BAK Free)  1 drop Both Eyes QHS   Continuous:  EAV:[id number], acetaminophen, ondansetron (ZOFRAN) IV, ondansetron, oxyCODONE  Assessment/Plan: Fever, mental status change in a patient with multiple myeloma, recent hospitalization for Pseudomonas in June. At that time patient underwent   bronchoscopy that showed Pseudomonas was sensitive to cefepime and Cipro, fungal cultures were negative, AFB negative. Patient had persisted low-grade fever which precipitated readmission to the hospital and resumption of broad-spectrum antibiotics. Despite these antibiotics the patient continued with low-grade fever.  Infectious disease consult obtained over the weekend and  treatment for CMV was  added,  ganciclovir. Patient looks much better today. We will continue antibiotics as outlined by infectious disease. Multiple myeloma, hemoglobin stable at 8, lytic lesion in skull, treatment as outlined by hematology/oncology Chronic kidney disease stage III Diabetes with recent hypoglycemic spells, Hypertension   LOS: 6 days   Sanora Cunanan D 09/12/2012, 8:42 AM

## 2012-09-12 NOTE — Progress Notes (Signed)
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   PRIOR THERAPY:  1. Status post seven cycles of systemic chemotherapy with Revlimid and low-dose Betatron with partial response. The last dose was given June, 2010. 2. Status post chemotherapy with weekly Velcade in a patient on Decadron 40 mg on a weekly basis, status post 75 weekly doses. The last dose was given May 04, 2008. 3. Status post treatment again with Velcade and Decadron. The patient was awaiting stem cell transplant at [facility]. Last dose was given Jul 16, 2009. 4. The patient was not considered a good candidate for autologous stem cell transplant at that time secondary to poor mobilization of his stem cells.  5. Status post six cycles of systemic chemotherapy with Velcade, Cytoxan and Betatron. Last dose was given March 25, 2010. This continued secondary to his disease progression. 6. Status post ten cycles of systemic chemotherapy with Velcade. Doxil and Decadron. Last dose was given on 10/31/2010. Currently on hold because of concern about cardiac toxicity.  CURRENT THERAPY:  1. Pomalyst 4 mg by mouth daily for 21 days every 28 days.  2. Decadron 40 mg by mouth weekly   Subjective: The patient is seen and examined today.He was more alert this morning per wife report. However, at the time of evaluation he is more lethargic. Denies pain. . Low grade fever resolved.  Objective: Vital signs in last 24 hours: Temp:  [98.4 F (36.9 C)-103 F (39.4 C)] 98.6 F (37 C) (07/21 0649) Pulse Rate:  [82-102] 82 (07/21 0649) Resp:  [18-20] 18 (07/21 0649) BP: (134-148)/(84-90) 134/84 mmHg (07/21 0649) SpO2:  [93 %-97 %] 94 % (07/21 0649)  Intake/Output from previous day: 07/20 0701 - 07/21 0700 In: 120 [P.O.:120] Out: 300 [Urine:300] Intake/Output this shift:    . amLODipine  5 mg Oral Daily  . aspirin EC  81 mg Oral Daily   . atenolol  25 mg Oral Daily  . bisacodyl  15 mg Oral QODAY  . ceFEPime (MAXIPIME) IV  2 g Intravenous Q24H  . docusate sodium  100 mg Oral BID  . enoxaparin (LOVENOX) injection  40 mg Subcutaneous Q24H  . ganciclovir (CYTOVENE) IV  2.5 mg/kg Intravenous Q24H  . insulin glargine  20 Units Subcutaneous QHS  . levETIRAcetam  250 mg Oral BID  . mineral oil  10 mL Other QODAY  . OxyCODONE  10 mg Oral BID  . simvastatin  20 mg Oral QPM  . sodium chloride  3 mL Intravenous Q12H  . Travoprost (BAK Free)  1 drop Both Eyes QHS   acetaminophen, acetaminophen, ondansetron (ZOFRAN) IV, ondansetron, oxyCODONE  General appearance:lethargic, but answers to vouice commands, cooperative.no distress Resp: essentially clear to auscultation Cardio: regular rate and rhythm, S1, S2 normal, no murmur, click, rub or gallop GI: soft, non-tender; bowel sounds normal; no masses,  no organomegaly Extremities: extremities normal, atraumatic, no cyanosis or edema  Lab Results:   Recent Labs  09/10/12 0500 09/11/12 0519  WBC 3.1* 3.6*  HGB 7.2* 8.0*  HCT 22.5* 25.1*  PLT 400 429*   BMET  Recent Labs  09/10/12 0728 09/11/12 0519  NA  --  136  K  --  4.2  CL  --  101  CO2  --  22  GLUCOSE  --  94  BUN  --  25*  CREATININE 1.71* 1.66*  CALCIUM  --  9.9  Studies/Results: No results found.    Assessment/Plan:  1.This is a very pleasant 67 years old [place] American male with multiple myeloma, most recently treated with several cycles of oral Pomalyst and Decadron. His treatment has been on hold secondary to her recent hospitalization with PIV3 c/bPsA pneumonia  pneumonia and septic shock. Currently patient on Vanco and Cefepime ( for total 8 day) per Pharmacy. Will  to hold his myeloma treatment for now until improvement in his general condition.He has a followup appointment with Dr. Arbutus Ped  in around 7 days for reevaluation and repeat myeloma panel as well as further discussion of his  treatment options. 2.MRI of the brain showed evidence for progressive myeloma and the skull. Radiation Oncology evaluation pending.  3. Mental status changes,in the setting of sepsis and low grade fever, ? Mets. Improved. CMV viral load pending (pt on gancyclovir). Plan as per adm. Team and ID.  4.Appreciate Palliative Care consultation on 09/11/2012 . Awaiting formal note.  5. Leukopenia, stable.   Thank you so much for allowing me to participate in the care of Rodney Green, Will continue to follow up the patient with you and assist in his management.Dr. Arbutus Ped to write addendum to this note.   LOS: 6 days    [facility] 09/12/2012  Hematology/oncology attending: The patient is seen and examined today. His wife and daughter were at the bedside. He is feeling much better today. He has no fever over the last 24 hours. He is to have mild confusion. Continue current treatment with vancomycin and cefepime. The patient was also started on treatment with ganciclovir for questionable CMV viral infection. I would consider repeating myeloma panel to evaluate his condition before consideration of resuming any systemic therapy. The patient was also seen by the palliative care team for evaluation and discussion of goals of care. Continue current treatment for now. I will arrange for the patient followup appointment with me after discharge for more detailed discussion of his treatment options.

## 2012-09-12 NOTE — Progress Notes (Signed)
Radiation Oncology         [phone] ________________________________  Name: Rodney Green MRN: [id number]  Date: 09/12/2012  DOB: [birth year]  Inpatient re-evaluation note  Referring physician: Dr. Si Gaul  CC: Katy Apo, MD  Polite, [name], MD  Diagnosis:   Relapsed multiple myeloma  Interval Since Last Radiation:  4 years and six-months.  the patient completed radiation therapy to the T11-L3 spine for painful involvement in this area. The patient completed 24 gray in 12 fractions.  Narrative:  The patient is seen today out of the courtesy of Dr. Arbutus Ped for  consideration of radiation therapy as part of patient's overall management. The patient was recently admitted to the hospital  for severe sepsis from pneumonia. The patient was discharged and readmitted for intermittent fevers cough. 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  In light of the patient's confusion an MRI the brain was performed which showed progression of osseous myeloma changes within the skull area in addition there was a moderate sized deposit within the left occipital bone with early intracranial extension. Given the MRI findings radiation therapy is been consulted for consideration for treatments.  Patient denies any pain along the skull or scalp region. He denies any headaches. He does have confusion.  He denies any visual problems such as double vision or blurred vision.                     ALLERGIES:  is allergic to ace inhibitors.  Meds: No current facility-administered medications for this encounter.   No current outpatient prescriptions on file.   Facility-Administered Medications Ordered in Other Encounters  Medication Dose Route Frequency Provider Last Rate Last Dose  . acetaminophen (TYLENOL) tablet 650 mg  650 mg Oral Q6H PRN Eduard Clos, MD   650 mg at 09/12/12 1852   Or  . acetaminophen (TYLENOL) suppository 650 mg  650 mg Rectal Q6H PRN Eduard Clos, MD      .  ALPRAZolam Prudy Feeler) tablet 0.5 mg  0.5 mg Oral BID PRN Edsel Petrin, DO      . amLODipine (NORVASC) tablet 5 mg  5 mg Oral Daily Eduard Clos, MD   5 mg at 09/12/12 1012  . aspirin EC tablet 81 mg  81 mg Oral Daily Eduard Clos, MD   81 mg at 09/12/12 1012  . atenolol (TENORMIN) tablet 25 mg  25 mg Oral Daily Eduard Clos, MD   25 mg at 09/12/12 1012  . bisacodyl (DULCOLAX) EC tablet 15 mg  15 mg Oral QODAY Eduard Clos, MD   15 mg at 09/11/12 1005  . ceFEPIme (MAXIPIME) 2 g in dextrose 5 % 50 mL IVPB  2 g Intravenous Q24H Katy Apo, MD   2 g at 09/12/12 1239  . docusate sodium (COLACE) capsule 100 mg  100 mg Oral BID Eduard Clos, MD   100 mg at 09/12/12 1012  . enoxaparin (LOVENOX) injection 40 mg  40 mg Subcutaneous Q24H Eduard Clos, MD   40 mg at 09/12/12 1013  . ganciclovir (CYTOVENE) 205 mg in sodium chloride 0.9 % 100 mL IVPB  2.5 mg/kg Intravenous Q24H Judyann Munson, MD   205 mg at 09/12/12 1748  . insulin glargine (LANTUS) injection 20 Units  20 Units Subcutaneous QHS Edsel Petrin, DO   20 Units at 09/11/12 2250  . levETIRAcetam (KEPPRA) tablet 250 mg  250 mg Oral BID Jake Seats  Golding, DO   250 mg at 09/12/12 1012  . mineral oil liquid 10 mL  10 mL Other QODAY Katy Apo, MD   10 mL at 09/12/12 1011  . ondansetron (ZOFRAN) tablet 4 mg  4 mg Oral Q6H PRN Eduard Clos, MD       Or  . ondansetron [facility]) injection 4 mg  4 mg Intravenous Q6H PRN Eduard Clos, MD      . oxyCODONE (Oxy IR/ROXICODONE) immediate release tablet 5 mg  5 mg Oral Q4H PRN Eduard Clos, MD      . oxyCODONE (OXYCONTIN) 12 hr tablet 10 mg  10 mg Oral BID Eduard Clos, MD   10 mg at 09/12/12 1012  . simvastatin (ZOCOR) tablet 20 mg  20 mg Oral QPM Eduard Clos, MD   20 mg at 09/12/12 1748  . sodium chloride 0.9 % injection 3 mL  3 mL Intravenous Q12H Eduard Clos, MD   3 mL at 09/12/12 1013  . Travoprost (BAK  Free) (TRAVATAN) 0.004 % ophthalmic solution SOLN 1 drop  1 drop Both Eyes QHS Eduard Clos, MD   1 drop at 09/11/12 2250    Physical Findings: The patient is in no acute distress. Patient is alert and somewhat confused. He appears to have some expressive aphasia.  He is accompanied by his wife on evaluation today. Patient is lying comfortably in his hospital bed. Palpation along the scalp region reveals no obvious mass or tenderness.   Lab Findings: Lab Results  Component Value Date   WBC 3.6* 09/11/2012   HGB 8.0* 09/11/2012   HCT 25.1* 09/11/2012   MCV 87.8 09/11/2012   PLT 429* 09/11/2012      Radiographic Findings: Dg Chest 2 View Ct Chest Wo Contrast  08/15/2012   *RADIOLOGY REPORT*  Clinical Data: Neutropenic.  Myeloma.  Sepsis.  Lung injury. Cough.  Shortness of breath.  CT CHEST WITHOUT CONTRAST  Technique:  Multidetector CT imaging of the chest was performed following the standard protocol without IV contrast.  Comparison: Plain films earlier today.  Most recent CT of 02/25/2012  Findings: Lungs/pleura: Moderate motion degradation throughout. No lobar consolidation.  Improved aeration since the prior exam. Residual thickening along the peribronchovascular interstitium, including in the left upper lobe on image 24/series 5.  This is nonspecific.  No pleural fluid.  Heart/Mediastinum: Tortuous thoracic aorta.  Mild cardiomegaly with dense coronary artery atherosclerosis. No pericardial effusion. Pulmonary artery enlargement, with outflow tract measuring 3.8 cm. No mediastinal or definite hilar adenopathy, given limitations of unenhanced CT.  Upper abdomen: Motion degradation continuing into the upper abdomen.  Bones/Musculoskeletal:  Lipoma within the right supraspinatus musculature of 2.7 cm.  Progression of markedly heterogeneous marrow density.  Expansile anterior left seventh rib lesion on image 45/series 2 is new or progressive.  Healing 11th and 12th posterior right rib  fractures  Vertebral body height loss at multiple levels with prior vertebral augmentation at L1.  IMPRESSION:  1.  Moderate motion degraded exam.  Overall improved aeration since the prior CT with suspicion of residual nonspecific peribronchovascular interstitial thickening. 2.  Cardiomegaly with advanced coronary artery atherosclerosis. 3. Pulmonary artery enlargement suggests pulmonary arterial hypertension. 4.  Progression of osseous findings most consistent with the clinical history of myeloma.   Original Report Authenticated By: Jeronimo Greaves, M.D.   Mri  Brain Wo Contrast  09/07/2012   *RADIOLOGY REPORT*  Clinical Data: Encephalopathy.  Myeloma.  Renal disease.  Diabetes.  MRI HEAD WITHOUT CONTRAST  Technique:  Multiplanar, multiecho pulse sequences of the brain and surrounding structures were obtained according to standard protocol without intravenous contrast.  Comparison: CT head 01/27/2012.  Findings: Widespread lytic lesions throughout the skull are redemonstrated, and increased compared with prior CT head.  The largest lesion is in the left occipital bone, cross section 10 x 27 mm, which has invaded the intracranial compartment but does not significantly compress the brain.  No acute stroke or hemorrhage.  No intra-axial mass lesion or hydrocephalus.  Premature cerebral and cerebellar atrophy.  Slight white matter disease.  No chronic hemorrhage.  Flow voids are maintained in the major intracranial vessels.  Normal pituitary and cerebellar tonsils. Within limits of evaluation on noncontrast MR, no visible venous sinus occlusion.  Heterogeneous upper cervical bone marrow reflecting myeloma  with spondylosis at C3-4 mildly compressing the cord.  Chronic sinusitis.  No mastoid fluid.  Negative orbits.  IMPRESSION: Premature cerebral and cerebellar atrophy.  No acute stroke, hemorrhage, for intracranial inflammatory process.  Progression of osseous myeloma changes in the skull, with a moderate sized  deposit left occipital bone with early intracranial extension.  Continued surveillance warranted.   Original Report Authenticated By: Davonna Belling, M.D.     Impression:  Progressive multiple myeloma with skull lesion as documented above. I discussed palliative radiation therapy for this issue with the patient and his wife. Patient seems to be asymptomatic from this lesion at this time. In light of all of his other medical issues at this time the patient does not wish to consider radiation therapy but would reconsider at a later date if imaging shows significant progression or if he becomes symptomatic from this lesion.    _____________________________________ -----------------------------------  Billie Lade, PhD, MD

## 2012-09-12 NOTE — Progress Notes (Signed)
I agree with assessment. 

## 2012-09-12 NOTE — Progress Notes (Signed)
[facility]    Date of Admission:  09/06/2012   Total days of antibiotics 7        Day 7 cefepime        Day 3 ganciclovir        (finished 5 days of vanco)   ID: Rodney Green is a 67 y.o. 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 amLODipine  5 mg Oral Daily  . aspirin EC  81 mg Oral Daily  . atenolol  25 mg Oral Daily  . bisacodyl  15 mg Oral QODAY  . ceFEPime (MAXIPIME) IV  2 g Intravenous Q24H  . docusate sodium  100 mg Oral BID  . enoxaparin (LOVENOX) injection  40 mg Subcutaneous Q24H  . ganciclovir (CYTOVENE) IV  2.5 mg/kg Intravenous Q24H  . insulin glargine  20 Units Subcutaneous QHS  . levETIRAcetam  250 mg Oral BID  . mineral oil  10 mL Other QODAY  . OxyCODONE  10 mg Oral BID  . simvastatin  20 mg Oral QPM  . sodium chloride  3 mL Intravenous Q12H  . Travoprost (BAK Free)  1 drop Both Eyes QHS    Objective: Vital signs in last 24 hours: Temp:  [98.4 F (36.9 C)-103 F (39.4 C)] 102 F (38.9 C) (07/21 1415) Pulse Rate:  [82-102] 93 (07/21 1415) Resp:  [18-20] 18 (07/21 1415) BP: (132-148)/(62-90) 132/62 mmHg (07/21 1415) SpO2:  [93 %-98 %] 98 % (07/21 1415)  Constitutional: He is oriented to person, place, and time. He appears well-developed and well-nourished. No distress.  HENT:  Mouth/Throat: Oropharynx is clear and moist. No oropharyngeal exudate.  Cardiovascular: Normal rate, regular rhythm and normal heart sounds. Exam reveals no gallop and no friction rub.  No murmur heard.  Pulmonary/Chest: Effort normal and breath sounds normal. No respiratory distress. He has no wheezes.   Abdominal: Soft. Bowel sounds are normal. He exhibits no distension. There is no tenderness.  Lymphadenopathy:  no cervical adenopathy.  Neurological: He is alert and oriented to person, place, and time.  Skin: Skin is warm and dry. No rash noted. No erythema.  Psychiatric: He has a normal mood and affect. His behavior is normal.    Lab Results  Recent Labs  09/10/12 0500 09/10/12 0728 09/11/12 0519  WBC 3.1*  --  3.6*  HGB 7.2*  --  8.0*  HCT 22.5*  --  25.1*  NA  --   --  136  K  --   --  4.2  CL  --   --  101  CO2  --   --  22  BUN  --   --  25*  CREATININE  --  1.71* 1.66*    Microbiology: 7/19 blood cx x 2 PENDING 7/16 blood cx x 2 NGTD 7/15 urine cx NGTD  Studies/Results: No results found.   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   - CMV viral load pending - on day #3 gancyclovir, presuming that CMV viremia has been the source  of his fevers. We will dose per pharmacy protocol, likely reduced dose due to CrCl. Can likely change to an oral regimen as an outpatient pending what goals of care will be for the patient - will need to watch for leukopenia associated with its side effect  - would complete 8 day course of cefepime for HCAP coverage   Maile Linford, [facility] Infect[facility] Diseases Cell: [phone] Pager: [phone]  09/12/2012, 2:32 PM

## 2012-09-13 LAB — CULTURE, BLOOD (ROUTINE X 2): Culture: NO GROWTH

## 2012-09-13 LAB — GLUCOSE, CAPILLARY: Glucose-Capillary: 210 mg/dL — ABNORMAL HIGH (ref 70–99)

## 2012-09-13 LAB — FUNGUS CULTURE W SMEAR: Fungal Smear: NONE SEEN

## 2012-09-13 LAB — CBC WITH DIFFERENTIAL/PLATELET
Basophils Absolute: 0 10*3/uL (ref 0.0–0.1)
Eosinophils Relative: 0 % (ref 0–5)
Lymphocytes Relative: 19 % (ref 12–46)
MCV: 88.5 fL (ref 78.0–100.0)
Platelets: 326 10*3/uL (ref 150–400)
RDW: 15.7 % — ABNORMAL HIGH (ref 11.5–15.5)
WBC: 3.8 10*3/uL — ABNORMAL LOW (ref 4.0–10.5)

## 2012-09-13 LAB — MULTIPLE MYELOMA PANEL, SERUM
IgA: 2010 mg/dL — ABNORMAL HIGH (ref 68–379)
IgG (Immunoglobin G), Serum: 410 mg/dL — ABNORMAL LOW (ref 650–1600)
M-Spike, %: 1.6 g/dL
Total Protein: 6.8 g/dL (ref 6.0–8.3)

## 2012-09-13 LAB — CREATININE, SERUM: GFR calc Af Amer: 46 mL/min — ABNORMAL LOW (ref >90)

## 2012-09-13 NOTE — Progress Notes (Signed)
Dr [name] and Dr Kevan Ny returned call with new orders.

## 2012-09-13 NOTE — Progress Notes (Addendum)
[facility]    Date of Admission:  09/06/2012   Total days of antibiotics 8        Day 8 cefepime        Day 4 ganciclovir        (finished 5 days of vanco)   ID: Rodney Green is a 67 y.o. 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 Fatigue today  Medications:  . amLODipine  5 mg Oral Daily  . aspirin EC  81 mg Oral Daily  . atenolol  25 mg Oral Daily  . bisacodyl  15 mg Oral QODAY  . docusate sodium  100 mg Oral BID  . enoxaparin (LOVENOX) injection  40 mg Subcutaneous Q24H  . ganciclovir (CYTOVENE) IV  2.5 mg/kg Intravenous Q24H  . insulin glargine  20 Units Subcutaneous QHS  . levETIRAcetam  250 mg Oral BID  . mineral oil  10 mL Other QODAY  . OxyCODONE  10 mg Oral BID  . simvastatin  20 mg Oral QPM  . sodium chloride  3 mL Intravenous Q12H  . Travoprost (BAK Free)  1 drop Both Eyes QHS    Objective: Vital signs in last 24 hours: Temp:  [98 F (36.7 C)-102.1 F (38.9 C)] 101 F (38.3 C) (07/22 1405) Pulse Rate:  [88-97] 88 (07/22 1405) Resp:  [18-20] 18 (07/22 1405) BP: (158-162)/(92-100) 162/100 mmHg (07/22 1405) SpO2:  [95 %-99 %] 95 % (07/22 1405)  Constitutional: He is oriented to person, place, and time. He appears well-developed and well-nourished. fatigue HENT:  Mouth/Throat: Oropharynx is clear and moist. No oropharyngeal exudate.  Cardiovascular: Normal rate, regular rhythm and normal heart sounds. Exam reveals no gallop and no friction rub.  No murmur heard.  Pulmonary/Chest: Effort normal and breath sounds normal. No respiratory distress. He has no wheezes.  Abdominal: Soft. Bowel  sounds are normal. He exhibits no distension. There is no tenderness.  Lymphadenopathy:  no cervical adenopathy.  Neurological: He is alert and oriented to person, place, and time.  Skin: Skin is warm and dry. No rash noted. No erythema.  Psychiatric: He has a normal mood and affect. His behavior is normal.    Lab Results  Recent Labs  09/11/12 0519 09/13/12 0500  WBC 3.6*  --   HGB 8.0*  --   HCT 25.1*  --   NA 136  --   K 4.2  --   CL 101  --   CO2 22  --   BUN 25*  --   CREATININE 1.66* 1.74*    Microbiology: 7/19 blood cx x 2 PENDING 7/16 blood cx x 2 NGTD 7/15 urine cx NGTD  Studies/Results: No results found.   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   - CMV viral load pending - on day #4 gancyclovir, presuming that CMV viremia has been the source of his fevers. We will dose per pharmacy protocol, likely reduced dose due to CrCl. Can likely change to an oral regimen as an outpatient pending what goals of care will be  for the patient - will check CBC with diff, need to watch for leukopenia associated with its side effect  - HCAP : completed 8 day course of cefepime for HCAP coverage today  Addendum:  repeat cbc showed worsening anemia, as H/H reported to me by RN. Asked for patient to have type and screen and 1 u RBC tsf, to confirm with Dr. Kevan Green.  Rodney Green [facility] Cell: [phone] Pager: [phone]  09/13/2012, 3:42 PM

## 2012-09-13 NOTE — Progress Notes (Signed)
Palliative Medicine Team Progress Note   S: More alert today and appropriate, still with intermittent fevers, but less frequently. Eating failrly well. Tremor seems to be improved.  Filed Vitals:   09/13/12 0554  BP: 158/96  Pulse: 92  Temp: 98.3 F (36.8 C)  Resp: 18   Low amplitude tremor. Drowsy. Expresses frustration being in the hospital.   Assessment/Recs: 67 yo man who has relapsed multiple myeloma who has had >4 years of chemotherapy and radiation to control his disease process. While he has done very well over the past few years he has recently had a series of set back related to infections that have been quite serious and have contributed to a significant decline. His wife and daughter have noticed worsening mental status -including very poor short term memory, intermittent confusion, progressive drowsiness, withdrawn behavior and the patient has also become tired and frustrated with being sick in general ie. refusing to take his pills at home and on his last admission was threatening to leave AMA. His wife is very concerned about his ability to stay at home alone and his safety. Mr. Grieger is largeky unaware of his confusion and doesn't think it is an issue. I do not think he would do well at all in either a SNF or ALF and family does not have resources to hire caregivers. At teh present time he is pursuing active treatment and would not qualify for hospice services, however I did introduce the concept of QOL and what a more comfort care oriented approach would look like for them to consider-patient endorsed many of the feelings of "wanting to be done with all of this" but he is worried about his wife and daughter- they both supported him and the family as whole discussed the possibility that they may be nearing that point if he continues to decline mentally and keeps requiring repeated hospitalizations for recurrent infection. For now they have agreed to hold course and see if  antivirals improve his condition and then explore additional options for chemotherapy depending on what he can tolerate.   Plan:  Will continue to support this patient and family holistically with goals of care and symptom management as they have requested.  Patient with worsening agitation and frustration, we discussed the use of low dose alprazolam only prn for sleep and to help with his anxiety. Patient and family agree to this for his comfort and control of his anxiety.   Time: 35 minutes. Greater than 50%  of this time was spent counseling and coordinating care related to the above assessment and plan.   Anderson Malta, DO Palliative Medicine

## 2012-09-13 NOTE — Progress Notes (Signed)
Visited with patient for a short time at request of other chaplain. He was not too responsive and said he was just tired of dealing with his illness. I offered him support and encouragement for whatever treatment gives him comfort. Had prayer and asked for peace and calm. Listened to his concerns.

## 2012-09-13 NOTE — Progress Notes (Signed)
CRITICAL VALUE ALERT  Critical value received: hgb 6.5  Date of notification: 09-13-12  Time of notification:  1715  Critical value read back:yes  Nurse who received alert: Gillian Shields, RN   MD notified (1st page): Dr Kevan Ny  Time of first page:  1715  MD notified (2nd page)  Time of second page:1734  Responding MD:    Time MD responded:

## 2012-09-13 NOTE — Progress Notes (Signed)
Call placed to Dr Polite's office. Dr Kevan Ny paged with no return x 3.  Dr [name] paged.

## 2012-09-13 NOTE — Progress Notes (Signed)
Subjective: Patient without any new complaints currently he is afebrile however did have a temperature yesterday. Hematology, radiation oncology and infectious disease input noted.  Objective: Vital signs in last 24 hours: Temp:  [98 F (36.7 C)-102.1 F (38.9 C)] 98.3 F (36.8 C) (07/22 0554) Pulse Rate:  [92-99] 92 (07/22 0554) Resp:  [18-20] 18 (07/22 0554) BP: (132-158)/(62-96) 158/96 mmHg (07/22 0554) SpO2:  [97 %-99 %] 99 % (07/22 0554) Weight change:  Last BM Date: 09/09/12  Intake/Output from previous day: 07/21 0701 - 07/22 0700 In: -  Out: 251 [Urine:251] Intake/Output this shift:    General appearance: alert, cooperative and does not appear as lucid as he did yesterday Resp: clear to auscultation bilaterally Cardio: regular rate and rhythm, S1, S2 normal, no murmur, click, rub or gallop Extremities: pedal edema bilateral  Lab Results:  Results for orders placed during the hospital encounter of 09/06/12 (from the past 24 hour(s))  CREATININE, SERUM     Status: Abnormal   Collection Time    09/13/12  5:00 AM      Result Value Range   Creatinine, Ser 1.74 (*) 0.50 - 1.35 mg/dL   GFR calc non Af Amer 40 (*) >90 mL/min   GFR calc Af Amer 46 (*) >90 mL/min  GLUCOSE, CAPILLARY     Status: Abnormal   Collection Time    09/13/12  7:53 AM      Result Value Range   Glucose-Capillary 172 (*) 70 - 99 mg/dL   Comment 1 Notify RN        Studies/Results: No results found.  Medications:  Prior to Admission:  Prescriptions prior to admission  Medication Sig Dispense Refill  . amLODipine (NORVASC) 5 MG tablet Take 5 mg by mouth daily.        Marland Kitchen aspirin 81 MG tablet Take 81 mg by mouth daily.      Marland Kitchen atenolol (TENORMIN) 25 MG tablet Take 25 mg by mouth daily.      . bisacodyl (BISACODYL) 5 MG EC tablet Take 15 mg by mouth every other day.       Marland Kitchen dexamethasone (DECADRON) 4 MG tablet TAKE 10 TABLETS (40 MG) ONCE WEEKLY  130 tablet  0  . diphenhydrAMINE (SOMINEX) 25 MG  tablet Take 25 mg by mouth at bedtime.      . docusate sodium (COLACE) 100 MG capsule Take 2 capsules by mouth twice daily      . filgrastim (NEUPOGEN) 480 MCG/0.8ML SOLN injection Inject 0.8 mLs (480 mcg total) into the skin as directed.  4 Syringe  1  . HUMALOG 100 UNIT/ML injection Inject into the skin 3 (three) times daily before meals. Sliding scale      . insulin glargine (LANTUS) 100 UNIT/ML injection Inject 64 Units into the skin at bedtime.       . mineral oil external liquid Apply 1 application topically every other day. Every other day apply to stomach      . oxycodone (OXY-IR) 5 MG capsule Take 5 mg by mouth every 4 (four) hours as needed.      Marland Kitchen oxyCODONE (OXYCONTIN) 10 MG 12 hr tablet Take 1 tablet (10 mg total) by mouth every 12 (twelve) hours.  60 tablet  0  . POMALYST 4 MG capsule Take 4 mg by mouth daily.       . simvastatin (ZOCOR) 20 MG tablet Take 20 mg by mouth every evening.      . TRAVATAN Z 0.004 % SOLN ophthalmic  solution Place 1 drop into both eyes at bedtime.      . valACYclovir (VALTREX) 500 MG tablet Take 1 tablet (500 mg total) by mouth 2 (two) times daily.  14 tablet  1   Scheduled: . amLODipine  5 mg Oral Daily  . aspirin EC  81 mg Oral Daily  . atenolol  25 mg Oral Daily  . bisacodyl  15 mg Oral QODAY  . ceFEPime (MAXIPIME) IV  2 g Intravenous Q24H  . docusate sodium  100 mg Oral BID  . enoxaparin (LOVENOX) injection  40 mg Subcutaneous Q24H  . ganciclovir (CYTOVENE) IV  2.5 mg/kg Intravenous Q24H  . insulin glargine  20 Units Subcutaneous QHS  . levETIRAcetam  250 mg Oral BID  . mineral oil  10 mL Other QODAY  . OxyCODONE  10 mg Oral BID  . simvastatin  20 mg Oral QPM  . sodium chloride  3 mL Intravenous Q12H  . Travoprost (BAK Free)  1 drop Both Eyes QHS   Continuous:   Assessment/Plan: Fever, mental status change in a patient with multiple myeloma, recent hospitalization for Pseudomonas in June. At that time patient underwent bronchoscopy that  showed Pseudomonas was sensitive to cefepime and Cipro, fungal cultures were negative, AFB negative. Patient had persisted low-grade fever which precipitated readmission to the hospital and resumption of broad-spectrum antibiotics. Despite these antibiotics the patient continued with low-grade fever. Infectious disease consult obtained over the weekend and treatment for CMV was added, ganciclovir.  We will continue antibiotics as outlined by infectious disease. 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 No plans for radiation to lytic lesion at this time  Chronic kidney disease stage III   Diabetes with recent hypoglycemic spells,Blood sugars stable in the last 24-48 hours   Hypertension   LOS: 7 days   Laylana Gerwig D 09/13/2012, 9:02 AM

## 2012-09-13 NOTE — Progress Notes (Addendum)
ANTIBIOTIC CONSULT NOTE - follow up  Pharmacy Consult for cefepime Indication: Low grade temp, immunocompromised host  Allergies  Allergen Reactions  . Ace Inhibitors     He is not sure about allergy to ace inhibitors    Patient Measurements: Height: 5' 5.5" (166.4 cm) Weight: 180 lb 1.9 oz (81.7 kg) IBW/kg (Calculated) : 62.65  Vital Signs: Temp: 98.3 F (36.8 C) (07/22 0554) Temp src: Oral (07/22 0554) BP: 158/96 mmHg (07/22 0554) Pulse Rate: 92 (07/22 0554) Intake/Output from previous day: 07/21 0701 - 07/22 0700 In: -  Out: 251 [Urine:251] Intake/Output from this shift:    Labs:  Recent Labs  09/11/12 0519 09/13/12 0500  WBC 3.6*  --   HGB 8.0*  --   PLT 429*  --   CREATININE 1.66* 1.74*   Estimated Creatinine Clearance: 42.6 ml/min (by C-G formula based on Cr of 1.74). No results found for this basename: VANCOTROUGH, VANCOPEAK, VANCORANDOM, GENTTROUGH, GENTPEAK, GENTRANDOM, TOBRATROUGH, TOBRAPEAK, TOBRARND, AMIKACINPEAK, AMIKACINTROU, AMIKACIN,  in the last 72 hours   Microbiology: Recent Results (from the past 720 hour(s))  CULTURE, BLOOD (ROUTINE X 2)     Status: None   Collection Time    08/14/12  8:45 AM      Result Value Range Status   Specimen Description BLOOD LEFT HAND   Final   Special Requests BOTTLES DRAWN AEROBIC AND ANAEROBIC 4CC EACH   Final   Culture  Setup Time 08/14/2012 17:47   Final   Culture NO GROWTH 5 DAYS   Final   Report Status 08/20/2012 FINAL   Final  CULTURE, BLOOD (ROUTINE X 2)     Status: None   Collection Time    08/14/12  9:00 AM      Result Value Range Status   Specimen Description BLOOD RIGHT HAND   Final   Special Requests BOTTLES DRAWN AEROBIC AND ANAEROBIC 5CC EACH   Final   Culture  Setup Time 08/14/2012 17:47   Final   Culture NO GROWTH 5 DAYS   Final   Report Status 08/20/2012 FINAL   Final  CULTURE, EXPECTORATED SPUTUM-ASSESSMENT     Status: None   Collection Time    08/14/12  9:42 AM      Result Value  Range Status   Specimen Description SPUTUM   Final   Special Requests Immunocompromised   Final   Sputum evaluation     Final   Value: THIS SPECIMEN IS ACCEPTABLE. RESPIRATORY CULTURE REPORT TO FOLLOW.   Report Status 08/14/2012 FINAL   Final  CULTURE, RESPIRATORY (NON-EXPECTORATED)     Status: None   Collection Time    08/14/12  9:42 AM      Result Value Range Status   Specimen Description SPUTUM   Final   Special Requests NONE   Final   Gram Stain     Final   Value: NO WBC SEEN     RARE SQUAMOUS EPITHELIAL CELLS PRESENT     FEW GRAM POSITIVE COCCI IN PAIRS     FEW GRAM POSITIVE RODS   Culture NORMAL OROPHARYNGEAL FLORA   Final   Report Status 08/16/2012 FINAL   Final  URINE CULTURE     Status: None   Collection Time    08/14/12 10:17 AM      Result Value Range Status   Specimen Description URINE, CATHETERIZED   Final   Special Requests NONE   Final   Culture  Setup Time 08/14/2012 15:22   Final  Colony Count 3,000 COLONIES/ML   Final   Culture ENTEROBACTER AEROGENES   Final   Report Status 08/16/2012 FINAL   Final   Organism ID, Bacteria ENTEROBACTER AEROGENES   Final  MRSA PCR SCREENING     Status: None   Collection Time    08/14/12 11:55 AM      Result Value Range Status   MRSA by PCR NEGATIVE  NEGATIVE Final   Comment:            The GeneXpert MRSA Assay (FDA     approved for NASAL specimens     only), is one component of a     comprehensive MRSA colonization     surveillance program. It is not     intended to diagnose MRSA     infection nor to guide or     monitor treatment for     MRSA infections.  RESPIRATORY VIRUS PANEL     Status: Abnormal   Collection Time    08/14/12  2:13 PM      Result Value Range Status   Source - RVPAN NASAL SWAB   Corrected   Comment: CORRECTED ON 06/23 AT 1913: PREVIOUSLY REPORTED AS NASAL SWAB   Respiratory Syncytial Virus A NOT DETECTED   Final   Respiratory Syncytial Virus B NOT DETECTED   Final   Influenza A NOT DETECTED    Final   Influenza B NOT DETECTED   Final   Parainfluenza 1 NOT DETECTED   Final   Parainfluenza 2 NOT DETECTED   Final   Parainfluenza 3 DETECTED (*)  Final   Metapneumovirus NOT DETECTED   Final   Rhinovirus NOT DETECTED   Final   Adenovirus NOT DETECTED   Final   Influenza A H1 NOT DETECTED   Final   Influenza A H3 NOT DETECTED   Final   Comment: (NOTE)           Normal Reference Range for each Analyte: NOT DETECTED     Testing performed using the Luminex xTAG Respiratory Viral Panel test     kit.     This test was developed and its performance characteristics determined     by [employer]. It has not been cleared or approved by the [place]     Food and Drug Administration. This test is used for clinical purposes.     It should not be regarded as investigational or for research. This     laboratory is certified under the Clinical Laboratory Improvement     Amendments of 1988 (CLIA) as qualified to perform high complexity     clinical laboratory testing.  AFB CULTURE WITH SMEAR     Status: None   Collection Time    08/15/12  6:24 PM      Result Value Range Status   Specimen Description BRONCHIAL ALVEOLAR LAVAGE   Final   Special Requests Immunocompromised   Final   ACID FAST SMEAR NO ACID FAST BACILLI SEEN   Final   Culture     Final   Value: CULTURE WILL BE EXAMINED FOR 6 WEEKS BEFORE ISSUING A FINAL REPORT   Report Status PENDING   Incomplete  CULTURE, BAL-QUANTITATIVE     Status: None   Collection Time    08/15/12  6:24 PM      Result Value Range Status   Specimen Description BRONCHIAL ALVEOLAR LAVAGE   Final   Special Requests Immunocompromised   Final   Gram Stain  Final   Value: MODERATE WBC PRESENT,BOTH PMN AND MONONUCLEAR     NO SQUAMOUS EPITHELIAL CELLS SEEN     NO ORGANISMS SEEN   Colony Count 300 COLONIES/ML   Final   Culture PSEUDOMONAS AERUGINOSA   Final   Report Status 08/18/2012 FINAL   Final   Organism ID, Bacteria PSEUDOMONAS AERUGINOSA   Final   FUNGUS CULTURE W SMEAR     Status: None   Collection Time    08/15/12  6:24 PM      Result Value Range Status   Specimen Description BRONCHIAL ALVEOLAR LAVAGE   Final   Special Requests Immunocompromised   Final   Fungal Smear NO YEAST OR FUNGAL ELEMENTS SEEN   Final   Culture CULTURE IN PROGRESS FOR FOUR WEEKS   Final   Report Status PENDING   Incomplete  LEGIONELLA CULTURE     Status: None   Collection Time    08/15/12  6:24 PM      Result Value Range Status   Specimen Description BRONCHIAL ALVEOLAR LAVAGE   Final   Special Requests Immunocompromised   Final   Culture NO LEGIONELLA ISOLATED   Final   Report Status 08/21/2012 FINAL   Final  PNEUMOCYSTIS JIROVECI SMEAR BY DFA     Status: None   Collection Time    08/15/12  6:24 PM      Result Value Range Status   Specimen Source-[facility] BRONCHIAL ALVEOLAR LAVAGE   Final   Pneumocystis jiroveci Ag NEGATIVE   Final   Comment: Performed at [facility]  RESPIRATORY VIRUS PANEL     Status: Abnormal   Collection Time    08/15/12  6:24 PM      Result Value Range Status   Source - RVPAN BRONCHIAL ALVEOLAR LAVAGE   Corrected   Comment: CORRECTED ON 06/24 AT 1801: PREVIOUSLY REPORTED AS BRONCHIAL ALVEOLAR LAVAGE   Respiratory Syncytial Virus A NOT DETECTED   Final   Respiratory Syncytial Virus B NOT DETECTED   Final   Influenza A NOT DETECTED   Final   Influenza B NOT DETECTED   Final   Parainfluenza 1 NOT DETECTED   Final   Parainfluenza 2 NOT DETECTED   Final   Parainfluenza 3 DETECTED (*)  Final   Comment: RESULT CALLED TO, READ BACK BY AND VERIFIED WITH:     Hughes Better RN [id number] 08/17/12 A NAVARRO   Metapneumovirus NOT DETECTED   Final   Rhinovirus NOT DETECTED   Final   Adenovirus NOT DETECTED   Final   Influenza A H1 NOT DETECTED   Final   Influenza A H3 NOT DETECTED   Final   Comment: (NOTE)           Normal Reference Range for each Analyte: NOT DETECTED     Testing performed using the Luminex xTAG Respiratory  Viral Panel test     kit.     This test was developed and its performance characteristics determined     by [employer]. It has not been cleared or approved by the [place]     Food and Drug Administration. This test is used for clinical purposes.     It should not be regarded as investigational or for research. This     laboratory is certified under the Clinical Laboratory Improvement     Amendments of 1988 (CLIA) as qualified to perform high complexity     clinical laboratory testing.  URINE CULTURE  Status: None   Collection Time    08/30/12 10:36 AM      Result Value Range Status   Colony Count 5,000 COLONIES/ML   Final   Organism ID, Bacteria Insignificant Growth   Final  URINE CULTURE     Status: None   Collection Time    09/06/12 10:23 PM      Result Value Range Status   Specimen Description URINE, CLEAN CATCH   Final   Special Requests NONE   Final   Culture  Setup Time 09/07/2012 06:33   Final   Colony Count NO GROWTH   Final   Culture NO GROWTH   Final   Report Status 09/08/2012 FINAL   Final  CULTURE, BLOOD (ROUTINE X 2)     Status: None   Collection Time    09/07/12 12:40 AM      Result Value Range Status   Specimen Description BLOOD LEFT ARM   Final   Special Requests BOTTLES DRAWN AEROBIC AND ANAEROBIC    Final   Culture  Setup Time 09/07/2012 06:31   Final   Culture     Final   Value:        BLOOD CULTURE RECEIVED NO GROWTH TO DATE CULTURE WILL BE HELD FOR 5 DAYS BEFORE ISSUING A FINAL NEGATIVE REPORT   Report Status PENDING   Incomplete  CULTURE, BLOOD (ROUTINE X 2)     Status: None   Collection Time    09/07/12 12:45 AM      Result Value Range Status   Specimen Description BLOOD LEFT HAND   Final   Special Requests BOTTLES DRAWN AEROBIC ONLY    Final   Culture  Setup Time 09/07/2012 06:32   Final   Culture     Final   Value:        BLOOD CULTURE RECEIVED NO GROWTH TO DATE CULTURE WILL BE HELD FOR 5 DAYS BEFORE ISSUING A FINAL NEGATIVE REPORT    Report Status PENDING   Incomplete  CULTURE, BLOOD (ROUTINE X 2)     Status: None   Collection Time    09/10/12  7:15 PM      Result Value Range Status   Specimen Description BLOOD LEFT ARM   Final   Special Requests BOTTLES DRAWN AEROBIC AND ANAEROBIC    Final   Culture  Setup Time 09/11/2012 15:01   Final   Culture     Final   Value:        BLOOD CULTURE RECEIVED NO GROWTH TO DATE CULTURE WILL BE HELD FOR 5 DAYS BEFORE ISSUING A FINAL NEGATIVE REPORT   Report Status PENDING   Incomplete  CULTURE, BLOOD (ROUTINE X 2)     Status: None   Collection Time    09/10/12  7:25 PM      Result Value Range Status   Specimen Description BLOOD LEFT ARM   Final   Special Requests BOTTLES DRAWN AEROBIC ONLY    Final   Culture  Setup Time 09/11/2012 15:01   Final   Culture     Final   Value:        BLOOD CULTURE RECEIVED NO GROWTH TO DATE CULTURE WILL BE HELD FOR 5 DAYS BEFORE ISSUING A FINAL NEGATIVE REPORT   Report Status PENDING   Incomplete  CULTURE, BLOOD (ROUTINE X 2)     Status: None   Collection Time    09/11/12  8:29 PM  Result Value Range Status   Specimen Description BLOOD LEFT ARM   Final   Special Requests BOTTLES DRAWN AEROBIC AND ANAEROBIC    Final   Culture  Setup Time 09/12/2012 02:33   Final   Culture     Final   Value:        BLOOD CULTURE RECEIVED NO GROWTH TO DATE CULTURE WILL BE HELD FOR 5 DAYS BEFORE ISSUING A FINAL NEGATIVE REPORT   Report Status PENDING   Incomplete  CULTURE, BLOOD (ROUTINE X 2)     Status: None   Collection Time    09/11/12  8:39 PM      Result Value Range Status   Specimen Description BLOOD LEFT HAND   Final   Special Requests BOTTLES DRAWN AEROBIC AND ANAEROBIC    Final   Culture  Setup Time 09/12/2012 02:33   Final   Culture     Final   Value:        BLOOD CULTURE RECEIVED NO GROWTH TO DATE CULTURE WILL BE HELD FOR 5 DAYS BEFORE ISSUING A FINAL NEGATIVE REPORT   Report Status PENDING   Incomplete    Medical  History: Past Medical History  Diagnosis Date  . Allergy   . Diabetes mellitus   . Hypertension   . Hepatitis C   . Multiple myeloma   . Multiple myeloma   . Chronic kidney disease   . Shortness of breath     Medications:  Anti-infectives   Start     Dose/Rate Route Frequency Ordered Stop   09/10/12 1830  ganciclovir (CYTOVENE) 205 mg in sodium chloride 0.9 % 100 mL IVPB     2.5 mg/kg  81.7 kg 100 mL/hr over 60 Minutes Intravenous Every 24 hours 09/10/12 1744     09/07/12 2200  vancomycin (VANCOCIN) 1,250 mg in sodium chloride 0.9 % 250 mL IVPB  Status:  Discontinued     1,250 mg 166.7 mL/hr over 90 Minutes Intravenous Daily at bedtime 09/07/12 0531 09/10/12 1754   09/07/12 1200  ceFEPIme (MAXIPIME) 2 g in dextrose 5 % 50 mL IVPB     2 g 100 mL/hr over 30 Minutes Intravenous Every 24 hours 09/07/12 0531     09/07/12 0200  vancomycin (VANCOCIN) IVPB 1000 mg/200 mL premix     1,000 mg 200 mL/hr over 60 Minutes Intravenous  Once 09/07/12 0155 09/07/12 0350   09/07/12 0145  valACYclovir (VALTREX) tablet 500 mg  Status:  Discontinued     500 mg Oral 2 times daily 09/07/12 0141 09/10/12 1747   09/07/12 0015  ceFEPIme (MAXIPIME) 1 g in dextrose 5 % 50 mL IVPB     1 g 100 mL/hr over 30 Minutes Intravenous  Once 09/07/12 0010 09/07/12 0111     Assessment: Rodney Green, admitted 7/15 with fever and confusion. Recent admission for septic shock and respiratory failure secondary  Pneumonia from pseudomonas parainfluenza.  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  7/16 >> vanc >> 7/19 7/16 >> cefepime>>  7/19 >> Ganciclovir (ID MD) >>  Tmax: 102.1 WBCs: down to 3.6K on 7/20 Renal: SCr 1.74, CrCl 21ml/min (44N)  6/22 Blood: CMV DNA dectected 6/23 BAL: no legionella isolated, No AFB, pseudomonas 300 col/ml (sens to cefepime) 6/23 Resp viral panel (BAL): parainfluenza 3 detected 7/8 Urine: insig growth 7/16 Urine: NG F 7/16 blood x2: NGTD 7/19 blood x 2: NGTD 7/20 Blood x 2: NGTD 7/20  CMV viral load: pending  Goal of  Therapy:  Cefepime/Ganciclovir dosed based on patient weight and renal function  Plan:  Day #4 Ganciclovir, Day #7 cefepime  Cefepime and ganciclovir dosing remains appropriate for renal fx  Juliette Alcide, PharmD, BCPS.   Pager: [phone] 09/13/2012 8:05 AM

## 2012-09-14 DIAGNOSIS — R4182 Altered mental status, unspecified: Secondary | ICD-10-CM

## 2012-09-14 DIAGNOSIS — Z515 Encounter for palliative care: Secondary | ICD-10-CM

## 2012-09-14 DIAGNOSIS — B259 Cytomegaloviral disease, unspecified: Secondary | ICD-10-CM

## 2012-09-14 DIAGNOSIS — D649 Anemia, unspecified: Secondary | ICD-10-CM

## 2012-09-14 DIAGNOSIS — C9002 Multiple myeloma in relapse: Secondary | ICD-10-CM

## 2012-09-14 DIAGNOSIS — E1129 Type 2 diabetes mellitus with other diabetic kidney complication: Secondary | ICD-10-CM

## 2012-09-14 DIAGNOSIS — N183 Chronic kidney disease, stage 3 (moderate): Secondary | ICD-10-CM

## 2012-09-14 LAB — BASIC METABOLIC PANEL
BUN: 23 mg/dL (ref 6–23)
Chloride: 101 mEq/L (ref 96–112)
GFR calc Af Amer: 52 mL/min — ABNORMAL LOW (ref >90)
Potassium: 4.3 mEq/L (ref 3.5–5.1)
Sodium: 133 mEq/L — ABNORMAL LOW (ref 135–145)

## 2012-09-14 LAB — HEMOGLOBIN AND HEMATOCRIT, BLOOD
HCT: 24.9 % — ABNORMAL LOW (ref 39.0–52.0)
HCT: 26.1 % — ABNORMAL LOW (ref 39.0–52.0)
Hemoglobin: 8.4 g/dL — ABNORMAL LOW (ref 13.0–17.0)

## 2012-09-14 MED ORDER — ENSURE COMPLETE PO LIQD: 237.0000 mL | Freq: Two times a day (BID) | ORAL | Status: DC

## 2012-09-14 MED ORDER — ENSURE COMPLETE PO LIQD
237.0000 mL | Freq: Three times a day (TID) | ORAL | Status: DC
  Administered 2012-09-15 – 2012-09-20 (×7): 237 mL via ORAL

## 2012-09-14 NOTE — Progress Notes (Signed)
Palliative Medicine Team SW Psychosocial visit with pt, wife and brother at bedside. Pt reports "doing better" today and improvement in mentation. Wife endorsed fatigue and concern regarding pt's condition, specifically asking about whether any new lab results had come in; deferred to medical team. Pt reports "good spirits" and was pleased to have Chaplain visit yesterday.  Wife reports attempts to continue working throughout this process; states she plans to return to the hospital this evening after 6p. Empathized with difficulty of hospitalization and decision-making process, while also balancing other responsibilities. Left my contact info and encouraged family to call with needs.  Kennieth Francois, LCSW PMT phone [phone] Pager [phone]

## 2012-09-14 NOTE — Progress Notes (Signed)
MD paged due to pt's high blood pressures. MD did not respond or return page.

## 2012-09-14 NOTE — Progress Notes (Signed)
INITIAL NUTRITION ASSESSMENT  DOCUMENTATION CODES Per approved criteria  -Not Applicable   INTERVENTION: - Ensure Complete TID - Recommend MD discuss artifical nutrition versus comfort feeds if pt continues to have inadequate oral intake r/t confusion - Will continue to monitor   NUTRITION DIAGNOSIS: Inadequate oral intake related to confusion as evidenced by 0% meal intake.   Goal: Pt to consume >90% of meals/supplements  Monitor:  Weights, labs, intake  Reason for Assessment: Nutrition risk   67 y.o. male  Admitting Dx: Fever  ASSESSMENT: Pt known to RD from previous admission. Pt with history of hepatitis C and multiple myeloma s/p multiple rounds of chemotherapy. Pt admitted last month and was intubated for respiratory insufficiency. Pt with some confusion today, unable to state how well he was eating PTA. Palliative care team following pt, had goals of care meeting 7/20 with pt being identified as DNR and pt desiring full scope medical treatment. MD noted pt eating fairly well earlier this week. Pt did not eat any lunch today. Noted pt's weight down 9 pounds in the last 4 months, likely more than that however hard to tell with pt's edema.   Height: Ht Readings from Last 1 Encounters:  09/07/12 5' 5.5" (1.664 m)    Weight: Wt Readings from Last 1 Encounters:  09/07/12 180 lb 1.9 oz (81.7 kg)    Ideal Body Weight: 136 lb  % Ideal Body Weight: 132%  Wt Readings from Last 10 Encounters:  09/07/12 180 lb 1.9 oz (81.7 kg)  09/05/12 180 lb (81.647 kg)  09/01/12 178 lb 6.4 oz (80.922 kg)  08/30/12 179 lb (81.194 kg)  08/29/12 178 lb 6.4 oz (80.922 kg)  08/22/12 173 lb 15.1 oz (78.9 kg)  07/14/12 186 lb 4.8 oz (84.505 kg)  06/16/12 189 lb 14.4 oz (86.138 kg)  06/15/12 192 lb 3.2 oz (87.181 kg)  05/18/12 189 lb 1.6 oz (85.775 kg)    Usual Body Weight: 188 lb  % Usual Body Weight: 96%  BMI:  Body mass index is 29.51 kg/(m^2).  Estimated Nutritional  Needs: Kcal: 1550-1850 Protein: 60-75g Fluid: 1.5-1.8L/day  Skin: +3 RLE, +2 LLE edema  Diet Order: General  EDUCATION NEEDS: -No education needs identified at this time   Intake/Output Summary (Last 24 hours) at 09/14/12 1517 Last data filed at 09/14/12 1300  Gross per 24 hour  Intake 1232.5 ml  Output   1800 ml  Net -567.5 ml    Last BM: 7/22  Labs:   Recent Labs Lab 09/08/12 0525  09/11/12 0519 09/13/12 0500 09/14/12 0520  NA 135  --  136  --  133*  K 4.3  --  4.2  --  4.3  CL 102  --  101  --  101  CO2 21  --  22  --  23  BUN 28*  --  25*  --  23  CREATININE 1.90*  < > 1.66* 1.74* 1.56*  CALCIUM 9.6  --  9.9  --  9.8  GLUCOSE 146*  --  94  --  236*  < > = values in this interval not displayed.  CBG (last 3)   Recent Labs  09/13/12 0753 09/13/12 1135 09/14/12 0735  GLUCAP 172* 210* 222*    Scheduled Meds: . amLODipine  5 mg Oral Daily  . aspirin EC  81 mg Oral Daily  . atenolol  25 mg Oral Daily  . bisacodyl  15 mg Oral QODAY  . docusate sodium  100 mg  Oral BID  . enoxaparin (LOVENOX) injection  40 mg Subcutaneous Q24H  . ganciclovir (CYTOVENE) IV  2.5 mg/kg Intravenous Q24H  . insulin glargine  20 Units Subcutaneous QHS  . levETIRAcetam  250 mg Oral BID  . mineral oil  10 mL Other QODAY  . OxyCODONE  10 mg Oral BID  . simvastatin  20 mg Oral QPM  . sodium chloride  3 mL Intravenous Q12H  . Travoprost (BAK Free)  1 drop Both Eyes QHS    Continuous Infusions:   Past Medical History  Diagnosis Date  . Allergy   . Diabetes mellitus   . Hypertension   . Hepatitis C   . Multiple myeloma   . Multiple myeloma   . Chronic kidney disease   . Shortness of breath     Past Surgical History  Procedure Laterality Date  . Circumcision    . Hand surgery    . Abdominal abcess      Levon Hedger MS, RD, LDN [phone] Pager [phone] After Hours Pager

## 2012-09-14 NOTE — Progress Notes (Addendum)
Palliative Care Team at [facility] Progress Note   Mr. Sturgell unfortunately continues to spike fevers and his mental status continues to fluctuate. He appears to be more withdrawn and is having confusion. Today he says "get me home" and seems to be more agitated.  His wife was not present in the room when I evaluated him however she requested that I call her this afternoon to discuss his situation. She expresses extreme concern about his mental status stating that it is getting much worse in her opinion, she fears he is declining and also is very anxious about a safe discharge plan. We discussed that he has focused on going home even when he is having delirium and that it would be extremely difficult for him to go into a skilled nursing or assisted living environment for his care- think this would be a setback for him both physically and mentally but also acknowledged her caregiver burnout and responsibilities to her own work. Unfortunately there are few resources in the community outside private pay caregivers they can provide the level of income care that she needs while she is at work to ensure his safety. Largely his mental status changes have been progressive and gradual- he reports that he was already having short-term memory loss, confusion and episodes of delirium prior to his last 2 hospitalizations- she feels that it is a cumulative effects of chemotherapy on his brain and now that he has viremia he has further decompensated. She is extremely sad and depressed over the home care options- he will probably not qualify for hospice care unless he does not continue treatment for his myeloma- I am not sure that they are at this point yet and I encouraged her to continue to explore options and that prognostically it is very difficult to predict if and when his mental status will recover. She is going to discuss options with her daughter and try to mobilize family and friend resources to make going home a  possibility when the patient is medically stable and ready. He would also be a good candidate for [facility]'s new Outpatient palliative medicine program-our team can help facilitate a referral. We will continue to follow this patient and family through this difficult decision-making process and in obtaining resources that will allow him to turn home and maintain the highest possible quality of life. 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    Corrected Calcium 11.5 (high) consider bisphosphonate, fluids  Renal function worsening, in setting of progressive myeloma Scr 1.7  Ammonia level  Assist with discharge resources and advance planning  25 minutes. Greater than 50%  of this time was spent counseling and coordinating care related to the above assessment and plan.   Edsel Petrin, DO  09/14/2012, 3:23 AM  Please contact Palliative Medicine Team phone at [phone] for questions and concerns.

## 2012-09-14 NOTE — Progress Notes (Signed)
Inpatient Diabetes Program Recommendations  AACE/ADA: New Consensus Statement on Inpatient Glycemic Control (2013)  Target Ranges:  Prepandial:   less than 140 mg/dL      Peak postprandial:   less than 180 mg/dL (1-2 hours)      Critically ill patients:  140 - 180 mg/dL   Reason for Visit: Hyperglycemia   Results for Rodney Green, Rodney Green (MRN [id number]) as of 09/14/2012 10:21  Ref. Range 09/14/2012 05:20  Glucose Latest Range: 70-99 mg/dl 409 (H)  Results for [name], [name] (MRN [id number]) as of 09/14/2012 10:21  Ref. Range 09/12/2012 08:18 09/13/2012 07:53 09/13/2012 11:35 09/14/2012 07:35  Glucose-Capillary Latest Range: 70-99 mg/dL 956 (H) 213 (H) 086 (H) 222 (H)    Recommendations:  Increase Lantus to 24 units QHS  Will continue to follow.  Thank you. Ailene Ards, RD, LDN, CDE Inpatient Diabetes Coordinator [phone]

## 2012-09-14 NOTE — Progress Notes (Signed)
INFECTIOUS DISEASE PROGRESS NOTE  ID: Rodney Green is a 67 y.o. male with  Principal Problem:   Fever Active Problems:   Multiple myeloma in relapse   Hypertension   Stage III chronic kidney disease   Acute encephalopathy  Subjective: Awakens transiently.  Per family, has been asleep last 2.5 h.  Still confused.   Abtx:  Anti-infectives   Start     Dose/Rate Route Frequency Ordered Stop   09/10/12 1830  ganciclovir (CYTOVENE) 205 mg in sodium chloride 0.9 % 100 mL IVPB     2.5 mg/kg  81.7 kg 100 mL/hr over 60 Minutes Intravenous Every 24 hours 09/10/12 1744     09/07/12 2200  vancomycin (VANCOCIN) 1,250 mg in sodium chloride 0.9 % 250 mL IVPB  Status:  Discontinued     1,250 mg 166.7 mL/hr over 90 Minutes Intravenous Daily at bedtime 09/07/12 0531 09/10/12 1754   09/07/12 1200  ceFEPIme (MAXIPIME) 2 g in dextrose 5 % 50 mL IVPB  Status:  Discontinued     2 g 100 mL/hr over 30 Minutes Intravenous Every 24 hours 09/07/12 0531 09/13/12 1541   09/07/12 0200  vancomycin (VANCOCIN) IVPB 1000 mg/200 mL premix     1,000 mg 200 mL/hr over 60 Minutes Intravenous  Once 09/07/12 0155 09/07/12 0350   09/07/12 0145  valACYclovir (VALTREX) tablet 500 mg  Status:  Discontinued     500 mg Oral 2 times daily 09/07/12 0141 09/10/12 1747   09/07/12 0015  ceFEPIme (MAXIPIME) 1 g in dextrose 5 % 50 mL IVPB     1 g 100 mL/hr over 30 Minutes Intravenous  Once 09/07/12 0010 09/07/12 0111      Medications:  Scheduled: . amLODipine  5 mg Oral Daily  . aspirin EC  81 mg Oral Daily  . atenolol  25 mg Oral Daily  . bisacodyl  15 mg Oral QODAY  . docusate sodium  100 mg Oral BID  . enoxaparin (LOVENOX) injection  40 mg Subcutaneous Q24H  . ganciclovir (CYTOVENE) IV  2.5 mg/kg Intravenous Q24H  . insulin glargine  20 Units Subcutaneous QHS  . levETIRAcetam  250 mg Oral BID  . mineral oil  10 mL Other QODAY  . OxyCODONE  10 mg Oral BID  . simvastatin  20 mg Oral QPM  . sodium chloride  3  mL Intravenous Q12H  . Travoprost (BAK Free)  1 drop Both Eyes QHS    Objective: Vital signs in last 24 hours: Temp:  [97.7 F (36.5 C)-100.4 F (38 C)] 98.1 F (36.7 C) (07/23 1324) Pulse Rate:  [72-108] 72 (07/23 1324) Resp:  [16-24] 16 (07/23 1324) BP: (159-178)/(86-108) 161/94 mmHg (07/23 1324) SpO2:  [93 %-95 %] 95 % (07/23 1324)   General appearance: no distress Throat: normal findings: oropharynx pink & moist without lesions or evidence of thrush Resp: clear to auscultation bilaterally Cardio: regular rate and rhythm GI: normal findings: bowel sounds normal and soft, non-tender  Lab Results  Recent Labs  09/13/12 0500  09/13/12 1648 09/14/12 0520 09/14/12 0650  WBC  --   --  3.8*  --   --   HGB  --   < > 6.5* 7.9* 8.4*  HCT  --   < > 21.5* 24.9* 26.1*  NA  --   --   --  133*  --   K  --   --   --  4.3  --   CL  --   --   --  101  --   CO2  --   --   --  23  --   BUN  --   --   --  23  --   CREATININE 1.74*  --   --  1.56*  --   < > = values in this interval not displayed. Liver Panel No results found for this basename: PROT, ALBUMIN, AST, ALT, ALKPHOS, BILITOT, BILIDIR, IBILI,  in the last 72 hours Sedimentation Rate No results found for this basename: ESRSEDRATE,  in the last 72 hours C-Reactive Protein No results found for this basename: CRP,  in the last 72 hours  Microbiology: Recent Results (from the past 240 hour(s))  URINE CULTURE     Status: None   Collection Time    09/06/12 10:23 PM      Result Value Range Status   Specimen Description URINE, CLEAN CATCH   Final   Special Requests NONE   Final   Culture  Setup Time 09/07/2012 06:33   Final   Colony Count NO GROWTH   Final   Culture NO GROWTH   Final   Report Status 09/08/2012 FINAL   Final  CULTURE, BLOOD (ROUTINE X 2)     Status: None   Collection Time    09/07/12 12:40 AM      Result Value Range Status   Specimen Description BLOOD LEFT ARM   Final   Special Requests BOTTLES DRAWN  AEROBIC AND ANAEROBIC    Final   Culture  Setup Time 09/07/2012 06:31   Final   Culture NO GROWTH 5 DAYS   Final   Report Status 09/13/2012 FINAL   Final  CULTURE, BLOOD (ROUTINE X 2)     Status: None   Collection Time    09/07/12 12:45 AM      Result Value Range Status   Specimen Description BLOOD LEFT HAND   Final   Special Requests BOTTLES DRAWN AEROBIC ONLY    Final   Culture  Setup Time 09/07/2012 06:32   Final   Culture NO GROWTH 5 DAYS   Final   Report Status 09/13/2012 FINAL   Final  CULTURE, BLOOD (ROUTINE X 2)     Status: None   Collection Time    09/10/12  7:15 PM      Result Value Range Status   Specimen Description BLOOD LEFT ARM   Final   Special Requests BOTTLES DRAWN AEROBIC AND ANAEROBIC    Final   Culture  Setup Time 09/11/2012 15:01   Final   Culture     Final   Value:        BLOOD CULTURE RECEIVED NO GROWTH TO DATE CULTURE WILL BE HELD FOR 5 DAYS BEFORE ISSUING A FINAL NEGATIVE REPORT   Report Status PENDING   Incomplete  CULTURE, BLOOD (ROUTINE X 2)     Status: None   Collection Time    09/10/12  7:25 PM      Result Value Range Status   Specimen Description BLOOD LEFT ARM   Final   Special Requests BOTTLES DRAWN AEROBIC ONLY    Final   Culture  Setup Time 09/11/2012 15:01   Final   Culture     Final   Value:        BLOOD CULTURE RECEIVED NO GROWTH TO DATE CULTURE WILL BE HELD FOR 5 DAYS BEFORE ISSUING A FINAL NEGATIVE REPORT   Report Status PENDING   Incomplete  CULTURE, BLOOD (ROUTINE  X 2)     Status: None   Collection Time    09/11/12  8:29 PM      Result Value Range Status   Specimen Description BLOOD LEFT ARM   Final   Special Requests BOTTLES DRAWN AEROBIC AND ANAEROBIC    Final   Culture  Setup Time 09/12/2012 02:33   Final   Culture     Final   Value:        BLOOD CULTURE RECEIVED NO GROWTH TO DATE CULTURE WILL BE HELD FOR 5 DAYS BEFORE ISSUING A FINAL NEGATIVE REPORT   Report Status PENDING   Incomplete  CULTURE, BLOOD  (ROUTINE X 2)     Status: None   Collection Time    09/11/12  8:39 PM      Result Value Range Status   Specimen Description BLOOD LEFT HAND   Final   Special Requests BOTTLES DRAWN AEROBIC AND ANAEROBIC    Final   Culture  Setup Time 09/12/2012 02:33   Final   Culture     Final   Value:        BLOOD CULTURE RECEIVED NO GROWTH TO DATE CULTURE WILL BE HELD FOR 5 DAYS BEFORE ISSUING A FINAL NEGATIVE REPORT   Report Status PENDING   Incomplete    Studies/Results: No results found.   Assessment/Plan: Multiple Myeloma Fever, Confusion Sepsis HCAP,   previous pseudomonas June  Previous CMV DNA+ blood CMV+ CKD stage III Diabetes  Temps better, Tmax 100.4 Cr better BCx are NGTD Await CMV PCR on blood No change in ganciclovir (GCV) Last ANC 2300.   Total days of antibiotics:   GCV day 5  Cefepime - 7/16 to 7/21  Vancomycin- 7/16 to 7/19           Johny Sax Infectious Diseases (pager) [phone] [email or web address] 09/14/2012, 3:48 PM  LOS: 8 days

## 2012-09-14 NOTE — Progress Notes (Signed)
Subjective: Events noted past 24 hours, drop in hemoglobin requiring transfusion. Patient still with intermittent low-grade temperature, bouts of confusion episodically. ID, palliative care input appreciated. I have tried to call patient's wife this morning there was no answer. I likes hematology to revisit. Patient without any new complaints  Objective: Vital signs in last 24 hours: Temp:  [97.7 F (36.5 C)-101 F (38.3 C)] 100.4 F (38 C) (07/23 0644) Pulse Rate:  [88-108] 90 (07/23 0644) Resp:  [18-24] 18 (07/23 0438) BP: (152-178)/(86-108) 162/91 mmHg (07/23 0644) SpO2:  [93 %-95 %] 95 % (07/23 0105) Weight change:  Last BM Date: 09/13/12  Intake/Output from previous day: 07/22 0701 - 07/23 0700 In: 872.5 [Blood:362.5] Out: 1500 [Urine:1500] Intake/Output this shift: Total I/O In: 240 [P.O.:240] Out: -   General appearance: pleasant, no apparent distress Resp: few basilar crackles, no rales otherwise clear Cardio: regular rate and rhythm, S1, S2 normal, no murmur, click, rub or gallop Extremities: still some pedal edema  Lab Results:  Results for orders placed during the hospital encounter of 09/06/12 (from the past 24 hour(s))  CBC WITH DIFFERENTIAL     Status: Abnormal   Collection Time    09/13/12  4:48 PM      Result Value Range   WBC 3.8 (*) 4.0 - 10.5 K/uL   RBC 2.43 (*) 4.22 - 5.81 MIL/uL   Hemoglobin 6.5 (*) 13.0 - 17.0 g/dL   HCT 13.0 (*) 86.5 - 78.4 %   MCV 88.5  78.0 - 100.0 fL   MCH 26.7  26.0 - 34.0 pg   MCHC 30.2  30.0 - 36.0 g/dL   RDW 69.6 (*) 29.5 - 28.4 %   Platelets 326  150 - 400 K/uL   Neutrophils Relative % 61  43 - 77 %   Neutro Abs 2.3  1.7 - 7.7 K/uL   Lymphocytes Relative 19  12 - 46 %   Lymphs Abs 0.7  0.7 - 4.0 K/uL   Monocytes Relative 20 (*) 3 - 12 %   Monocytes Absolute 0.8  0.1 - 1.0 K/uL   Eosinophils Relative 0  0 - 5 %   Eosinophils Absolute 0.0  0.0 - 0.7 K/uL   Basophils Relative 1  0 - 1 %   Basophils Absolute 0.0  0.0 -  0.1 K/uL   RBC Morphology ROULEAUX    PREPARE RBC (CROSSMATCH)     Status: None   Collection Time    09/13/12  6:10 PM      Result Value Range   Order Confirmation ORDER PROCESSED BY BLOOD BANK    TYPE AND SCREEN     Status: None   Collection Time    09/13/12  6:42 PM      Result Value Range   ABO/RH(D) B POS     Antibody Screen NEG     Sample Expiration 09/16/2012     Unit Number [id number]     Blood Component Type RED CELLS,LR     Unit division 00     Status of Unit ISSUED     Transfusion Status OK TO TRANSFUSE     Crossmatch Result Compatible    AMMONIA     Status: None   Collection Time    09/14/12  5:20 AM      Result Value Range   Ammonia 47  11 - 60 umol/L  BASIC METABOLIC PANEL     Status: Abnormal   Collection Time    09/14/12  5:20 AM  Result Value Range   Sodium 133 (*) 135 - 145 mEq/L   Potassium 4.3  3.5 - 5.1 mEq/L   Chloride 101  96 - 112 mEq/L   CO2 23  19 - 32 mEq/L   Glucose, Bld 236 (*) 70 - 99 mg/dL   BUN 23  6 - 23 mg/dL   Creatinine, Ser 4.09 (*) 0.50 - 1.35 mg/dL   Calcium 9.8  8.4 - 81.1 mg/dL   GFR calc non Af Amer 45 (*) >90 mL/min   GFR calc Af Amer 52 (*) >90 mL/min  HEMOGLOBIN AND HEMATOCRIT, BLOOD     Status: Abnormal   Collection Time    09/14/12  5:20 AM      Result Value Range   Hemoglobin 7.9 (*) 13.0 - 17.0 g/dL   HCT 91.4 (*) 78.2 - 95.6 %  HEMOGLOBIN AND HEMATOCRIT, BLOOD     Status: Abnormal   Collection Time    09/14/12  6:50 AM      Result Value Range   Hemoglobin 8.4 (*) 13.0 - 17.0 g/dL   HCT 21.3 (*) 08.6 - 57.8 %  GLUCOSE, CAPILLARY     Status: Abnormal   Collection Time    09/14/12  7:35 AM      Result Value Range   Glucose-Capillary 222 (*) 70 - 99 mg/dL      Studies/Results: No results found.  Medications:  Prior to Admission:  Prescriptions prior to admission  Medication Sig Dispense Refill  . amLODipine (NORVASC) 5 MG tablet Take 5 mg by mouth daily.        Marland Kitchen aspirin 81 MG tablet Take 81 mg by  mouth daily.      Marland Kitchen atenolol (TENORMIN) 25 MG tablet Take 25 mg by mouth daily.      . bisacodyl (BISACODYL) 5 MG EC tablet Take 15 mg by mouth every other day.       Marland Kitchen dexamethasone (DECADRON) 4 MG tablet TAKE 10 TABLETS (40 MG) ONCE WEEKLY  130 tablet  0  . diphenhydrAMINE (SOMINEX) 25 MG tablet Take 25 mg by mouth at bedtime.      . docusate sodium (COLACE) 100 MG capsule Take 2 capsules by mouth twice daily      . filgrastim (NEUPOGEN) 480 MCG/0.8ML SOLN injection Inject 0.8 mLs (480 mcg total) into the skin as directed.  4 Syringe  1  . HUMALOG 100 UNIT/ML injection Inject into the skin 3 (three) times daily before meals. Sliding scale      . insulin glargine (LANTUS) 100 UNIT/ML injection Inject 64 Units into the skin at bedtime.       . mineral oil external liquid Apply 1 application topically every other day. Every other day apply to stomach      . oxycodone (OXY-IR) 5 MG capsule Take 5 mg by mouth every 4 (four) hours as needed.      Marland Kitchen oxyCODONE (OXYCONTIN) 10 MG 12 hr tablet Take 1 tablet (10 mg total) by mouth every 12 (twelve) hours.  60 tablet  0  . POMALYST 4 MG capsule Take 4 mg by mouth daily.       . simvastatin (ZOCOR) 20 MG tablet Take 20 mg by mouth every evening.      . TRAVATAN Z 0.004 % SOLN ophthalmic solution Place 1 drop into both eyes at bedtime.      . valACYclovir (VALTREX) 500 MG tablet Take 1 tablet (500 mg total) by mouth 2 (two) times daily.  14 tablet  1   Scheduled: . amLODipine  5 mg Oral Daily  . aspirin EC  81 mg Oral Daily  . atenolol  25 mg Oral Daily  . bisacodyl  15 mg Oral QODAY  . docusate sodium  100 mg Oral BID  . enoxaparin (LOVENOX) injection  40 mg Subcutaneous Q24H  . ganciclovir (CYTOVENE) IV  2.5 mg/kg Intravenous Q24H  . insulin glargine  20 Units Subcutaneous QHS  . levETIRAcetam  250 mg Oral BID  . mineral oil  10 mL Other QODAY  . OxyCODONE  10 mg Oral BID  . simvastatin  20 mg Oral QPM  . sodium chloride  3 mL Intravenous Q12H  .  Travoprost (BAK Free)  1 drop Both Eyes QHS   Continuous:   Assessment/Plan: Fever, mental status change in a patient with multiple myeloma, recent hospitalization for Pseudomonas in June. At that time patient underwent bronchoscopy that showed Pseudomonas was sensitive to cefepime and Cipro, fungal cultures were negative, AFB negative. Patient had persisted low-grade fever which precipitated readmission to the hospital and resumption of broad-spectrum antibiotics. Despite these antibiotics the patient continued with low-grade fever. Infectious disease consult obtained  and treatment for CMV was added, ganciclovir. Current antibiotic course is been complete. Continue antiviral, await further input from ID.  Relapse myeloma, transfusion required yesterday. We'll ask hematology to revisit to give any additional comments. Chronic kidney disease stage III, renal function at baseline Diabetes no recurrent hypoglycemic spells Deconditioning due to recurrent illness. Patient still desires to return home. Issues have been well outlined in  palliative care note.         LOS: 8 days   Ashantae Pangallo D 09/14/2012, 12:40 PM

## 2012-09-14 NOTE — Progress Notes (Signed)
Patient noted to be very sleepy and although he rouses briefly he does not hold a conversation. Temperature is 102.8 axillary. Also developed a right shoulder twitch. Dr. Pete Glatter notified. Gave tylenol to help with temperature. Erskin Burnet RN

## 2012-09-14 NOTE — Progress Notes (Signed)
CARE MANAGEMENT NOTE 09/14/2012  Patient:  Rodney Green, Rodney Green   Account Number:  [id number]  Date Initiated:  09/07/2012  Documentation initiated by:  [facility]  Subjective/Objective Assessment:   67 year old male with hx multiple myeloma admitted with fever and confusion.     Action/Plan:   From home with spouse.   Anticipated DC Date:  09/17/2012   Anticipated DC Plan: Home.   DC Planning Services  CM consult          Status of service:  In process, will continue to follow Medicare Important Message given?  NA - LOS <3 / Initial given by admissions  Per UR Regulation:  Reviewed for med. necessity/level of care/duration of sta    Comments:  09/14/12 Xiamara Hulet RN BSN Was asked by bedside RN to reach out to spouse as she had questions regarding d/c planning and needs. I called the home #, no answer and I was unable to leave a message as the memory was full. I will attempt again tomorrow.

## 2012-09-14 NOTE — Progress Notes (Signed)
1 unit of PRBCs infused. Pts bp's elevated prior to transfusion and stayed elevated throughout the transfusion. MD was paged to inform about pt's blood pressure, but no response.

## 2012-09-15 ENCOUNTER — Inpatient Hospital Stay (HOSPITAL_COMMUNITY): Payer: 59

## 2012-09-15 ENCOUNTER — Other Ambulatory Visit: Payer: Self-pay | Admitting: Lab

## 2012-09-15 LAB — URINALYSIS W MICROSCOPIC + REFLEX CULTURE
Bilirubin Urine: NEGATIVE
Leukocytes, UA: NEGATIVE
Nitrite: NEGATIVE
Specific Gravity, Urine: 1.024 (ref 1.005–1.030)
Urobilinogen, UA: 0.2 mg/dL (ref 0.0–1.0)
pH: 5.5 (ref 5.0–8.0)

## 2012-09-15 LAB — GLUCOSE, CAPILLARY: Glucose-Capillary: 243 mg/dL — ABNORMAL HIGH (ref 70–99)

## 2012-09-15 LAB — TYPE AND SCREEN
ABO/RH(D): B POS
Antibody Screen: NEGATIVE

## 2012-09-15 MED ORDER — VANCOMYCIN HCL 10 G IV SOLR
1500.0000 mg | Freq: Once | INTRAVENOUS | Status: AC
  Administered 2012-09-15: 1500 mg via INTRAVENOUS
  Filled 2012-09-15: qty 1500

## 2012-09-15 MED ORDER — SODIUM CHLORIDE 0.9 % IV SOLN
500.0000 mg | Freq: Three times a day (TID) | INTRAVENOUS | Status: DC
  Administered 2012-09-15 – 2012-09-19 (×13): 500 mg via INTRAVENOUS
  Filled 2012-09-15 (×17): qty 500

## 2012-09-15 MED ORDER — VANCOMYCIN HCL IN DEXTROSE 750-5 MG/150ML-% IV SOLN
750.0000 mg | Freq: Two times a day (BID) | INTRAVENOUS | Status: DC
  Administered 2012-09-15 – 2012-09-18 (×6): 750 mg via INTRAVENOUS
  Filled 2012-09-15 (×8): qty 150

## 2012-09-15 MED ORDER — LABETALOL HCL 5 MG/ML IV SOLN
20.0000 mg | Freq: Once | INTRAVENOUS | Status: AC
  Administered 2012-09-15: 20 mg via INTRAVENOUS
  Filled 2012-09-15 (×2): qty 4

## 2012-09-15 MED ORDER — SODIUM CHLORIDE 0.45 % IV SOLN
INTRAVENOUS | Status: DC
  Administered 2012-09-17 – 2012-09-19 (×3): via INTRAVENOUS

## 2012-09-15 NOTE — Progress Notes (Signed)
Subjective: Patient still with recurrent temperatures. CMP he result noted, ID input noted. Palliative care input noted. I have discussed case again with oncology today. I have asked him to revisit.  Objective: Vital signs in last 24 hours: Temp:  [98.1 F (36.7 C)-102.8 F (39.3 C)] 99.7 F (37.6 C) (07/24 0630) Pulse Rate:  [72-99] 99 (07/24 0630) Resp:  [16-20] 20 (07/24 0630) BP: (147-164)/(76-96) 164/96 mmHg (07/24 0630) SpO2:  [91 %-95 %] 93 % (07/24 0630) Weight change:  Last BM Date: 09/13/12  Intake/Output from previous day: 07/23 0701 - 07/24 0700 In: 360 [P.O.:360] Out: 600 [Urine:600] Intake/Output this shift:    General appearance: pleasant, no apparent distress Resp: clear to auscultation bilaterally Cardio: regular rate and rhythm, S1, S2 normal, no murmur, click, rub or gallop Extremities: extremities normal, atraumatic, no cyanosis or edema  Lab Results:  Results for orders placed during the hospital encounter of 09/06/12 (from the past 24 hour(s))  GLUCOSE, CAPILLARY     Status: Abnormal   Collection Time    09/15/12  7:41 AM      Result Value Range   Glucose-Capillary 185 (*) 70 - 99 mg/dL      Studies/Results: No results found.  Medications:  Prior to Admission:  Prescriptions prior to admission  Medication Sig Dispense Refill  . amLODipine (NORVASC) 5 MG tablet Take 5 mg by mouth daily.        Marland Kitchen aspirin 81 MG tablet Take 81 mg by mouth daily.      Marland Kitchen atenolol (TENORMIN) 25 MG tablet Take 25 mg by mouth daily.      . bisacodyl (BISACODYL) 5 MG EC tablet Take 15 mg by mouth every other day.       Marland Kitchen dexamethasone (DECADRON) 4 MG tablet TAKE 10 TABLETS (40 MG) ONCE WEEKLY  130 tablet  0  . diphenhydrAMINE (SOMINEX) 25 MG tablet Take 25 mg by mouth at bedtime.      . docusate sodium (COLACE) 100 MG capsule Take 2 capsules by mouth twice daily      . filgrastim (NEUPOGEN) 480 MCG/0.8ML SOLN injection Inject 0.8 mLs (480 mcg total) into the skin as  directed.  4 Syringe  1  . HUMALOG 100 UNIT/ML injection Inject into the skin 3 (three) times daily before meals. Sliding scale      . insulin glargine (LANTUS) 100 UNIT/ML injection Inject 64 Units into the skin at bedtime.       . mineral oil external liquid Apply 1 application topically every other day. Every other day apply to stomach      . oxycodone (OXY-IR) 5 MG capsule Take 5 mg by mouth every 4 (four) hours as needed.      Marland Kitchen oxyCODONE (OXYCONTIN) 10 MG 12 hr tablet Take 1 tablet (10 mg total) by mouth every 12 (twelve) hours.  60 tablet  0  . POMALYST 4 MG capsule Take 4 mg by mouth daily.       . simvastatin (ZOCOR) 20 MG tablet Take 20 mg by mouth every evening.      . TRAVATAN Z 0.004 % SOLN ophthalmic solution Place 1 drop into both eyes at bedtime.      . valACYclovir (VALTREX) 500 MG tablet Take 1 tablet (500 mg total) by mouth 2 (two) times daily.  14 tablet  1   Scheduled: . amLODipine  5 mg Oral Daily  . aspirin EC  81 mg Oral Daily  . atenolol  25 mg Oral Daily  .  bisacodyl  15 mg Oral QODAY  . docusate sodium  100 mg Oral BID  . enoxaparin (LOVENOX) injection  40 mg Subcutaneous Q24H  . feeding supplement  237 mL Oral TID WC  . imipenem-cilastatin  500 mg Intravenous Q8H  . insulin glargine  20 Units Subcutaneous QHS  . levETIRAcetam  250 mg Oral BID  . mineral oil  10 mL Other QODAY  . OxyCODONE  10 mg Oral BID  . simvastatin  20 mg Oral QPM  . sodium chloride  3 mL Intravenous Q12H  . Travoprost (BAK Free)  1 drop Both Eyes QHS  . vancomycin  1,500 mg Intravenous Once  . vancomycin  750 mg Intravenous Q12H   Continuous:   Assessment/Plan: Fever, mental status change in a patient with multiple myeloma with recent hospitalization for Pseudomonas in June. Readmitted with the above chief complaint, treated with cefepime and Vanco without improvement. Recently started on ganciclovir without improvement. Now back on vancomycin and imipenem. Continued antibiotics per  ID Relapsed myeloma recently required transfusion, hematology asked to revisit to give input Chronic kidney disease renal function   Hypertension Diabetes  LOS: 9 days   Rodney Green 09/15/2012, 12:33 PM

## 2012-09-15 NOTE — Progress Notes (Signed)
Palliative Medicine Team SW Psychosocial follow up. No family present, pt sleeping soundly, did not awaken to verbal stimulus. Available as needed.   Kennieth Francois, LCSW PMT phone [phone] Pager [phone]

## 2012-09-15 NOTE — Progress Notes (Signed)
Stopped to see patient today. He was awake and lying on his side facing away from the door. I asked if he wanted a visit and he said he did not want a visit and did not want to talk about his condition.

## 2012-09-15 NOTE — Progress Notes (Signed)
Patient sleeping soundly , did not disturb.  Offered emotional support by phone to spouse Hope.  She is beginning to lean toward a more comfort oriented approach and agrees to met with me tomorrow to disuss further.   Zenora Karpel L. Ladona Ridgel, MD MBA The Palliative Medicine Team at [facility] Phone: [phone] Pager: [phone]

## 2012-09-15 NOTE — Progress Notes (Addendum)
INFECTIOUS DISEASE PROGRESS NOTE  ID: Rodney Green is a 67 y.o. male with  Principal Problem:   Fever Active Problems:   Multiple myeloma in relapse   Hypertension   Stage III chronic kidney disease   Acute encephalopathy  Subjective: Without complaints, alert. No loose BM.   Abtx:  Anti-infectives   Start     Dose/Rate Route Frequency Ordered Stop   09/10/12 1830  ganciclovir (CYTOVENE) 205 mg in sodium chloride 0.9 % 100 mL IVPB     2.5 mg/kg  81.7 kg 100 mL/hr over 60 Minutes Intravenous Every 24 hours 09/10/12 1744     09/07/12 2200  vancomycin (VANCOCIN) 1,250 mg in sodium chloride 0.9 % 250 mL IVPB  Status:  Discontinued     1,250 mg 166.7 mL/hr over 90 Minutes Intravenous Daily at bedtime 09/07/12 0531 09/10/12 1754   09/07/12 1200  ceFEPIme (MAXIPIME) 2 g in dextrose 5 % 50 mL IVPB  Status:  Discontinued     2 g 100 mL/hr over 30 Minutes Intravenous Every 24 hours 09/07/12 0531 09/13/12 1541   09/07/12 0200  vancomycin (VANCOCIN) IVPB 1000 mg/200 mL premix     1,000 mg 200 mL/hr over 60 Minutes Intravenous  Once 09/07/12 0155 09/07/12 0350   09/07/12 0145  valACYclovir (VALTREX) tablet 500 mg  Status:  Discontinued     500 mg Oral 2 times daily 09/07/12 0141 09/10/12 1747   09/07/12 0015  ceFEPIme (MAXIPIME) 1 g in dextrose 5 % 50 mL IVPB     1 g 100 mL/hr over 30 Minutes Intravenous  Once 09/07/12 0010 09/07/12 0111      Medications:  Scheduled: . amLODipine  5 mg Oral Daily  . aspirin EC  81 mg Oral Daily  . atenolol  25 mg Oral Daily  . bisacodyl  15 mg Oral QODAY  . docusate sodium  100 mg Oral BID  . enoxaparin (LOVENOX) injection  40 mg Subcutaneous Q24H  . feeding supplement  237 mL Oral TID WC  . ganciclovir (CYTOVENE) IV  2.5 mg/kg Intravenous Q24H  . insulin glargine  20 Units Subcutaneous QHS  . levETIRAcetam  250 mg Oral BID  . mineral oil  10 mL Other QODAY  . OxyCODONE  10 mg Oral BID  . simvastatin  20 mg Oral QPM  . sodium chloride   3 mL Intravenous Q12H  . Travoprost (BAK Free)  1 drop Both Eyes QHS    Objective: Vital signs in last 24 hours: Temp:  [98.1 F (36.7 C)-102.8 F (39.3 C)] 99.7 F (37.6 C) (07/24 0630) Pulse Rate:  [72-99] 99 (07/24 0630) Resp:  [16-20] 20 (07/24 0630) BP: (147-164)/(76-96) 164/96 mmHg (07/24 0630) SpO2:  [91 %-95 %] 93 % (07/24 0630)   General appearance: alert, cooperative and no distress Resp: clear to auscultation bilaterally Cardio: regular rate and rhythm GI: normal findings: bowel sounds normal and soft, non-tender Extremities: edema none and LUE peripheral IV- non-tender, no cordis.   Lab Results  Recent Labs  09/13/12 0500  09/13/12 1648 09/14/12 0520 09/14/12 0650  WBC  --   --  3.8*  --   --   HGB  --   < > 6.5* 7.9* 8.4*  HCT  --   < > 21.5* 24.9* 26.1*  NA  --   --   --  133*  --   K  --   --   --  4.3  --   CL  --   --   --  101  --   CO2  --   --   --  23  --   BUN  --   --   --  23  --   CREATININE 1.74*  --   --  1.56*  --   < > = values in this interval not displayed. Liver Panel No results found for this basename: PROT, ALBUMIN, AST, ALT, ALKPHOS, BILITOT, BILIDIR, IBILI,  in the last 72 hours Sedimentation Rate No results found for this basename: ESRSEDRATE,  in the last 72 hours C-Reactive Protein No results found for this basename: CRP,  in the last 72 hours  Microbiology: Recent Results (from the past 240 hour(s))  URINE CULTURE     Status: None   Collection Time    09/06/12 10:23 PM      Result Value Range Status   Specimen Description URINE, CLEAN CATCH   Final   Special Requests NONE   Final   Culture  Setup Time 09/07/2012 06:33   Final   Colony Count NO GROWTH   Final   Culture NO GROWTH   Final   Report Status 09/08/2012 FINAL   Final  CULTURE, BLOOD (ROUTINE X 2)     Status: None   Collection Time    09/07/12 12:40 AM      Result Value Range Status   Specimen Description BLOOD LEFT ARM   Final   Special Requests BOTTLES  DRAWN AEROBIC AND ANAEROBIC    Final   Culture  Setup Time 09/07/2012 06:31   Final   Culture NO GROWTH 5 DAYS   Final   Report Status 09/13/2012 FINAL   Final  CULTURE, BLOOD (ROUTINE X 2)     Status: None   Collection Time    09/07/12 12:45 AM      Result Value Range Status   Specimen Description BLOOD LEFT HAND   Final   Special Requests BOTTLES DRAWN AEROBIC ONLY    Final   Culture  Setup Time 09/07/2012 06:32   Final   Culture NO GROWTH 5 DAYS   Final   Report Status 09/13/2012 FINAL   Final  CULTURE, BLOOD (ROUTINE X 2)     Status: None   Collection Time    09/10/12  7:15 PM      Result Value Range Status   Specimen Description BLOOD LEFT ARM   Final   Special Requests BOTTLES DRAWN AEROBIC AND ANAEROBIC    Final   Culture  Setup Time 09/11/2012 15:01   Final   Culture     Final   Value:        BLOOD CULTURE RECEIVED NO GROWTH TO DATE CULTURE WILL BE HELD FOR 5 DAYS BEFORE ISSUING A FINAL NEGATIVE REPORT   Report Status PENDING   Incomplete  CULTURE, BLOOD (ROUTINE X 2)     Status: None   Collection Time    09/10/12  7:25 PM      Result Value Range Status   Specimen Description BLOOD LEFT ARM   Final   Special Requests BOTTLES DRAWN AEROBIC ONLY    Final   Culture  Setup Time 09/11/2012 15:01   Final   Culture     Final   Value:        BLOOD CULTURE RECEIVED NO GROWTH TO DATE CULTURE WILL BE HELD FOR 5 DAYS BEFORE ISSUING A FINAL NEGATIVE REPORT   Report Status PENDING   Incomplete  CULTURE, BLOOD (ROUTINE  X 2)     Status: None   Collection Time    09/11/12  8:29 PM      Result Value Range Status   Specimen Description BLOOD LEFT ARM   Final   Special Requests BOTTLES DRAWN AEROBIC AND ANAEROBIC    Final   Culture  Setup Time 09/12/2012 02:33   Final   Culture     Final   Value:        BLOOD CULTURE RECEIVED NO GROWTH TO DATE CULTURE WILL BE HELD FOR 5 DAYS BEFORE ISSUING A FINAL NEGATIVE REPORT   Report Status PENDING   Incomplete  CULTURE, BLOOD  (ROUTINE X 2)     Status: None   Collection Time    09/11/12  8:39 PM      Result Value Range Status   Specimen Description BLOOD LEFT HAND   Final   Special Requests BOTTLES DRAWN AEROBIC AND ANAEROBIC    Final   Culture  Setup Time 09/12/2012 02:33   Final   Culture     Final   Value:        BLOOD CULTURE RECEIVED NO GROWTH TO DATE CULTURE WILL BE HELD FOR 5 DAYS BEFORE ISSUING A FINAL NEGATIVE REPORT   Report Status PENDING   Incomplete    Studies/Results: No results found.   Assessment/Plan: Multiple Myeloma  Fever, Confusion  Sepsis  HCAP,  previous pseudomonas June  Previous CMV DNA+ blood   CKD stage III  Diabetes   Temps elevated overnight Cr worsened  BCx are all negative/NGTD  Will repeat his Cx's and CXR CMV PCR is <200 copies Will stop ganciclovir (GCV)  Will restart vanco, add imipemen Last ANC 2300.   Total days of antibiotics:  GCV day 6 Cefepime - 7/16 to 7/21  Vancomycin- 7/16 to 7/19           Johny Sax Infectious Diseases (pager) [phone] [email or web address] 09/15/2012, 10:28 AM  LOS: 9 days

## 2012-09-15 NOTE — Progress Notes (Addendum)
ANTIBIOTIC CONSULT NOTE - INITIAL  Pharmacy Consult for vancomycin (imipenem per MD) Indication: FUO  Allergies  Allergen Reactions  . Ace Inhibitors     He is not sure about allergy to ace inhibitors    Patient Measurements: Height: 5' 5.5" (166.4 cm) Weight: 180 lb 1.9 oz (81.7 kg) IBW/kg (Calculated) : 62.65 Adjusted Body Weight:   Vital Signs: Temp: 99.7 F (37.6 C) (07/24 0630) Temp src: Oral (07/24 0630) BP: 164/96 mmHg (07/24 0630) Pulse Rate: 99 (07/24 0630) Intake/Output from previous day: 07/23 0701 - 07/24 0700 In: 360 [P.O.:360] Out: 600 [Urine:600] Intake/Output from this shift:    Labs:  Recent Labs  09/13/12 0500 09/13/12 1648 09/14/12 0520 09/14/12 0650  WBC  --  3.8*  --   --   HGB  --  6.5* 7.9* 8.4*  PLT  --  326  --   --   CREATININE 1.74*  --  1.56*  --    Estimated Creatinine Clearance: 47.6 ml/min (by C-G formula based on Cr of 1.56). No results found for this basename: VANCOTROUGH, Leodis Binet, VANCORANDOM, GENTTROUGH, GENTPEAK, GENTRANDOM, TOBRATROUGH, TOBRAPEAK, TOBRARND, AMIKACINPEAK, AMIKACINTROU, AMIKACIN,  in the last 72 hours   Microbiology: Recent Results (from the past 720 hour(s))  URINE CULTURE     Status: None   Collection Time    08/30/12 10:36 AM      Result Value Range Status   Colony Count 5,000 COLONIES/ML   Final   Organism ID, Bacteria Insignificant Growth   Final  URINE CULTURE     Status: None   Collection Time    09/06/12 10:23 PM      Result Value Range Status   Specimen Description URINE, CLEAN CATCH   Final   Special Requests NONE   Final   Culture  Setup Time 09/07/2012 06:33   Final   Colony Count NO GROWTH   Final   Culture NO GROWTH   Final   Report Status 09/08/2012 FINAL   Final  CULTURE, BLOOD (ROUTINE X 2)     Status: None   Collection Time    09/07/12 12:40 AM      Result Value Range Status   Specimen Description BLOOD LEFT ARM   Final   Special Requests BOTTLES DRAWN AEROBIC AND ANAEROBIC     Final   Culture  Setup Time 09/07/2012 06:31   Final   Culture NO GROWTH 5 DAYS   Final   Report Status 09/13/2012 FINAL   Final  CULTURE, BLOOD (ROUTINE X 2)     Status: None   Collection Time    09/07/12 12:45 AM      Result Value Range Status   Specimen Description BLOOD LEFT HAND   Final   Special Requests BOTTLES DRAWN AEROBIC ONLY    Final   Culture  Setup Time 09/07/2012 06:32   Final   Culture NO GROWTH 5 DAYS   Final   Report Status 09/13/2012 FINAL   Final  CULTURE, BLOOD (ROUTINE X 2)     Status: None   Collection Time    09/10/12  7:15 PM      Result Value Range Status   Specimen Description BLOOD LEFT ARM   Final   Special Requests BOTTLES DRAWN AEROBIC AND ANAEROBIC    Final   Culture  Setup Time 09/11/2012 15:01   Final   Culture     Final   Value:        BLOOD CULTURE  RECEIVED NO GROWTH TO DATE CULTURE WILL BE HELD FOR 5 DAYS BEFORE ISSUING A FINAL NEGATIVE REPORT   Report Status PENDING   Incomplete  CULTURE, BLOOD (ROUTINE X 2)     Status: None   Collection Time    09/10/12  7:25 PM      Result Value Range Status   Specimen Description BLOOD LEFT ARM   Final   Special Requests BOTTLES DRAWN AEROBIC ONLY    Final   Culture  Setup Time 09/11/2012 15:01   Final   Culture     Final   Value:        BLOOD CULTURE RECEIVED NO GROWTH TO DATE CULTURE WILL BE HELD FOR 5 DAYS BEFORE ISSUING A FINAL NEGATIVE REPORT   Report Status PENDING   Incomplete  CULTURE, BLOOD (ROUTINE X 2)     Status: None   Collection Time    09/11/12  8:29 PM      Result Value Range Status   Specimen Description BLOOD LEFT ARM   Final   Special Requests BOTTLES DRAWN AEROBIC AND ANAEROBIC    Final   Culture  Setup Time 09/12/2012 02:33   Final   Culture     Final   Value:        BLOOD CULTURE RECEIVED NO GROWTH TO DATE CULTURE WILL BE HELD FOR 5 DAYS BEFORE ISSUING A FINAL NEGATIVE REPORT   Report Status PENDING   Incomplete  CULTURE, BLOOD (ROUTINE X 2)     Status:  None   Collection Time    09/11/12  8:39 PM      Result Value Range Status   Specimen Description BLOOD LEFT HAND   Final   Special Requests BOTTLES DRAWN AEROBIC AND ANAEROBIC    Final   Culture  Setup Time 09/12/2012 02:33   Final   Culture     Final   Value:        BLOOD CULTURE RECEIVED NO GROWTH TO DATE CULTURE WILL BE HELD FOR 5 DAYS BEFORE ISSUING A FINAL NEGATIVE REPORT   Report Status PENDING   Incomplete    Medical History: Past Medical History  Diagnosis Date  . Allergy   . Diabetes mellitus   . Hypertension   . Hepatitis C   . Multiple myeloma   . Multiple myeloma   . Chronic kidney disease   . Shortness of breath    Assessment: 66 yom w/ MM, admitted 7/15 with fever and confusion. Recent admission for septic shock and respiratory failure secondary Pneumonia from pseudomonas parainfluenza. 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  He continues to have fevers and ID has change antimicrobials to vancomycin/imipenem  7/16 >> vanc >> 7/19, resume 7/24 >> 7/16 >> cefepime>> 7/22 7/19 >> Ganciclovir (ID MD) >> 7/24 7/24 >> imipenem (ID MD) >>  Tmax: 102.8 WBCs: down to 3.8 Renal: SCr 1.56, CrCl 17ml/min (C-G, N)  6/22 Blood: CMV DNA dectected 6/23 BAL: no legionella isolated, No AFB, pseudomonas 300 col/ml (sens to cefepime) 6/23 Resp viral panel (BAL): parainfluenza 3 detected 7/8 Urine: insig growth 7/16 Urine: NG F 7/16 blood x2: NG-F 7/19 blood x 2: NGTD 7/20 Blood x 2: NGTD 7/20 CMV viral load pending 7/24: CMV DNA quant: < 200 copies detected, but although this result was below our lower limit of quantitation, CMV was detected.  Goal of Therapy:  Vancomycin trough level 15-20 mcg/ml  Plan:   Vancomycin 1500mg  IV  x 1 then 750mg  IV q12h  Based on previous vanco dose/trough in September, pt on vancomycin 750mg  IV q12h at that time with SCr = 1.28 (with trough = 14)  Check Steady-state trough  Imipenem dosing appropriate as  dosed per MD (no documented h/o seizures noted)  BMP in am  Juliette Alcide, PharmD, BCPS.   Pager: [phone]  09/15/2012,10:53 AM

## 2012-09-15 NOTE — Progress Notes (Signed)
Patient ZO:[id number] D Carp      DOB: 03-Jun-1945      [id number]   Palliative Medicine Team at [facility] Progress Note    Subjective: patient alert oriented to self. States feeling as good as he can.  Spouse not at bedside.  Reviewed notes for the day.      Tmax: 102.8 99.4 72 R: 16 161/94 95 % Physical exam: PERRL, slightly dazed appears with flat affect Chest: decreased but clear CVS: regular rate and rhythm S1, S2 Abd: soft, not tender Ext : warm Neuro: oriented to self, not time, oriented general place        Lab Results  Component Value Date   WBC 3.8* 09/13/2012   HGB 8.4* 09/14/2012   HCT 26.1* 09/14/2012   MCV 88.5 09/13/2012   PLT 326 09/13/2012   Lab Results  Component Value Date   CREATININE 1.56* 09/14/2012   BUN 23 09/14/2012   NA 133* 09/14/2012   K 4.3 09/14/2012   CL 101 09/14/2012   CO2 23 09/14/2012         Assessment and plan: 67 yr old with multiple myeloma admitted with fevers and altered mental status.  Patient being treated and followed by infectious disease.  Family struggling with poor prognosis.  Wife not at bedside will attempt to contact her in the am for further goals of care.  1.  DNR/ DNI  2.  Continue treatment for infection  3. Renal failure: slight improvement, continue to hydrate.  4. Corrected Calcium remains slighty  elevated at 11.7 . Will defer correction to primary MD.  May be influencing his cognition and definitively a poor prognosticator for long term survival  Will continue to work with his family on GOC.  Total time 15 min  Rodney Tallon L. Ladona Ridgel, MD MBA The Palliative Medicine Team at [facility] Phone: [phone] Pager: [phone]

## 2012-09-16 ENCOUNTER — Encounter: Payer: Self-pay | Admitting: Internal Medicine

## 2012-09-16 DIAGNOSIS — A419 Sepsis, unspecified organism: Secondary | ICD-10-CM

## 2012-09-16 LAB — BASIC METABOLIC PANEL
CO2: 23 mEq/L (ref 19–32)
Chloride: 99 mEq/L (ref 96–112)
GFR calc Af Amer: 54 mL/min — ABNORMAL LOW (ref >90)
Sodium: 135 mEq/L (ref 135–145)

## 2012-09-16 LAB — CMV (CYTOMEGALOVIRUS) DNA ULTRAQUANT, PCR: CMV DNA Quant: <200 copies/mL (ref <200)

## 2012-09-16 LAB — GLUCOSE, CAPILLARY
Glucose-Capillary: 309 mg/dL — ABNORMAL HIGH (ref 70–99)
Glucose-Capillary: 233 mg/dL — ABNORMAL HIGH (ref 70–99)

## 2012-09-16 MED ORDER — LORAZEPAM 2 MG/ML IJ SOLN
0.2500 mg | Freq: Four times a day (QID) | INTRAMUSCULAR | Status: DC
  Administered 2012-09-16: 0.25 mg via INTRAVENOUS
  Filled 2012-09-16: qty 1

## 2012-09-16 MED ORDER — LORAZEPAM 2 MG/ML IJ SOLN
0.5000 mg | INTRAMUSCULAR | Status: DC | PRN
  Administered 2012-09-16 – 2012-09-18 (×2): 0.5 mg via INTRAVENOUS
  Filled 2012-09-16 (×3): qty 1

## 2012-09-16 MED ORDER — INSULIN ASPART 100 UNIT/ML ~~LOC~~ SOLN
0.0000 [IU] | Freq: Three times a day (TID) | SUBCUTANEOUS | Status: DC
  Administered 2012-09-16 – 2012-09-18 (×3): 3 [IU] via SUBCUTANEOUS
  Administered 2012-09-18: 1 [IU] via SUBCUTANEOUS
  Administered 2012-09-19: 2 [IU] via SUBCUTANEOUS
  Administered 2012-09-19: 1 [IU] via SUBCUTANEOUS
  Administered 2012-09-19: 5 [IU] via SUBCUTANEOUS
  Administered 2012-09-20: 1 [IU] via SUBCUTANEOUS

## 2012-09-16 MED ORDER — INSULIN GLARGINE 100 UNIT/ML ~~LOC~~ SOLN
10.0000 [IU] | Freq: Every day | SUBCUTANEOUS | Status: DC
  Administered 2012-09-16 – 2012-09-19 (×4): 10 [IU] via SUBCUTANEOUS
  Filled 2012-09-16 (×6): qty 0.1

## 2012-09-16 NOTE — Progress Notes (Signed)
Patient WU:[id number] D Mak      DOB: 02/01/1946      YNW:[id number]   Palliative Medicine Team at [facility] Progress Note    Subjective:  Family at the bedside.  Patient agitated see spots, consciousness with intermittent blank stares with left gaze preference.  No obvious motor deficits but does have some Cheynne stokes respirations.  Talked with Wife and daughter they are feeling that this no way for Alexandr to live that he would not want that .  They are favoring comfort care over furtther diagnostics and treatment. Discussed with Dr. Nehemiah Settle offered CT head.  Family did not desire to put him through this  If it was not going to change overall big picture outcome.  They do want to treat the symptom of agitation with ativan.  They are going to discuss their decision to move in comfort care with the patient's brothers. I will make myself available for joint meeting to help hope communicate information to Desean's family.  Family leaning toward Residential Hospice placement.  For now would like to keep current treatments status quo , for go cT but treat the agitation more aggressively.  Discussed stress dose steroids with family and Dr. Nehemiah Settle.  Dr. Nehemiah Settle will consider use if appropriate.     Filed Vitals:   09/16/12 1100  BP:   Pulse: 87  Temp: 98.1 F (36.7 C)  Resp: 16   Physical exam:  General:  Agitated, furrowing brown, hanging over bed, can't communicate what is wrong Pupil appear equal but he has an occasion left gaze preference Chest decreased with no rhonchi CVS: tachy S1, S2, no murmrur Ext warm, edema 1+ Neuro:  Confused ,  agitated   BS 309  Labs reviewed      Assessment and plan: 67 yr old with multiple myeloma admitted with altered mental status and fevers of unknown origin.  Today more agitated seeing spots, left gaze preference which is intermittent. Family has given permission to start some comfort measures.  No head CT per family.  Keep current treatments for  now .  They want to talk with Akia's brothers about possible residential hospice with full comfort care.  1.  DNR/ DNI  2.  Agitation ? CNS issue    Like seizures:  Discussed with Dr. Nehemiah Settle.  Plan low dose scheduled ativan with prn breakthrough.  DC xanax  3. Keep current treatments status quo till family talks with brothers  Have initiated [employer] request to get son home from the [place].   Total time 60 min  Tamakia Porto L. Ladona Ridgel, MD MBA The Palliative Medicine Team at [facility] Phone: [phone] Pager: [phone]

## 2012-09-16 NOTE — Progress Notes (Signed)
Subjective: Patient pleasant, no apparent distress. Still slightly confused. His confusion tends to be worse when he has a fever. Gentle IV fluids have been started yesterday, I am unsure what his by mouth intake is like.  Objective: Vital signs in last 24 hours: Temp:  [97.9 F (36.6 C)-100.6 F (38.1 C)] 98.1 F (36.7 C) (07/25 0554) Pulse Rate:  [90-100] 90 (07/25 0554) Resp:  [16-18] 16 (07/25 0554) BP: (126-186)/(80-110) 169/90 mmHg (07/25 0554) SpO2:  [91 %-92 %] 91 % (07/25 0554) Weight change:  Last BM Date: 09/13/12  Intake/Output from previous day: 07/24 0701 - 07/25 0700 In: 1750 [I.V.:900; IV Piggyback:850] Out: 400 [Urine:400] Intake/Output this shift:    General appearance: chronically ill apparent Resp: moderate air movement, occasional basilar rhonchi, improves with deep cough Cardio: regular rate and rhythm, S1, S2 normal, no murmur, click, rub or gallop GI: soft, non-tender; bowel sounds normal; no masses,  no organomegaly Extremities: extremities normal, atraumatic, no cyanosis or edema  Lab Results:  Results for orders placed during the hospital encounter of 09/06/12 (from the past 24 hour(s))  CULTURE, BLOOD (ROUTINE X 2)     Status: None   Collection Time    09/15/12 11:45 AM      Result Value Range   Specimen Description BLOOD LEFT WRIST     Special Requests BOTTLES DRAWN AEROBIC ONLY 2CC     Culture  Setup Time 09/15/2012 15:18     Culture       Value:        BLOOD CULTURE RECEIVED NO GROWTH TO DATE CULTURE WILL BE HELD FOR 5 DAYS BEFORE ISSUING A FINAL NEGATIVE REPORT   Report Status PENDING    CULTURE, BLOOD (ROUTINE X 2)     Status: None   Collection Time    09/15/12 11:50 AM      Result Value Range   Specimen Description BLOOD RIGHT ARM     Special Requests BOTTLES DRAWN AEROBIC AND ANAEROBIC 4CC     Culture  Setup Time 09/15/2012 15:18     Culture       Value:        BLOOD CULTURE RECEIVED NO GROWTH TO DATE CULTURE WILL BE HELD FOR 5 DAYS  BEFORE ISSUING A FINAL NEGATIVE REPORT   Report Status PENDING    URINALYSIS W MICROSCOPIC + REFLEX CULTURE     Status: Abnormal   Collection Time    09/15/12  4:34 PM      Result Value Range   Color, Urine YELLOW  YELLOW   APPearance CLOUDY (*) CLEAR   Specific Gravity, Urine 1.024  1.005 - 1.030   pH 5.5  5.0 - 8.0   Glucose, UA 250 (*) NEGATIVE mg/dL   Hgb urine dipstick LARGE (*) NEGATIVE   Bilirubin Urine NEGATIVE  NEGATIVE   Ketones, ur NEGATIVE  NEGATIVE mg/dL   Protein, ur >161 (*) NEGATIVE mg/dL   Urobilinogen, UA 0.2  0.0 - 1.0 mg/dL   Nitrite NEGATIVE  NEGATIVE   Leukocytes, UA NEGATIVE  NEGATIVE   WBC, UA 0-2  <3 WBC/hpf   RBC / HPF 11-20  <3 RBC/hpf   Bacteria, UA FEW (*) RARE   Squamous Epithelial / LPF FEW (*) RARE   Urine-Other MUCOUS PRESENT    GLUCOSE, CAPILLARY     Status: Abnormal   Collection Time    09/15/12 10:31 PM      Result Value Range   Glucose-Capillary 243 (*) 70 - 99 mg/dL  BASIC  METABOLIC PANEL     Status: Abnormal   Collection Time    09/16/12  5:05 AM      Result Value Range   Sodium 135  135 - 145 mEq/L   Potassium 3.7  3.5 - 5.1 mEq/L   Chloride 99  96 - 112 mEq/L   CO2 23  19 - 32 mEq/L   Glucose, Bld 264 (*) 70 - 99 mg/dL   BUN 20  6 - 23 mg/dL   Creatinine, Ser 3.08 (*) 0.50 - 1.35 mg/dL   Calcium 9.7  8.4 - 65.7 mg/dL   GFR calc non Af Amer 46 (*) >90 mL/min   GFR calc Af Amer 54 (*) >90 mL/min      Studies/Results: Dg Chest 2 View  09/15/2012   *RADIOLOGY REPORT*  Clinical Data: Recent pneumonia.  CHEST - 2 VIEW  Comparison: 09/09/2012 and 09/06/2012  Findings: There is better lung expansion on the current study than on the prior exams.  There is central bronchovascular prominence, but no convincing infiltrate or pulmonary edema.  There is no pleural effusion or pneumothorax.  Multiple compression fractures are noted along the lower thoracic and upper lumbar spine, stable.  IMPRESSION: No convincing residual pneumonia.    Original Report Authenticated By: Amie Portland, M.Green.    Medications:  Prior to Admission:  Prescriptions prior to admission  Medication Sig Dispense Refill  . amLODipine (NORVASC) 5 MG tablet Take 5 mg by mouth daily.        Marland Kitchen aspirin 81 MG tablet Take 81 mg by mouth daily.      Marland Kitchen atenolol (TENORMIN) 25 MG tablet Take 25 mg by mouth daily.      . bisacodyl (BISACODYL) 5 MG EC tablet Take 15 mg by mouth every other day.       Marland Kitchen dexamethasone (DECADRON) 4 MG tablet TAKE 10 TABLETS (40 MG) ONCE WEEKLY  130 tablet  0  . diphenhydrAMINE (SOMINEX) 25 MG tablet Take 25 mg by mouth at bedtime.      . docusate sodium (COLACE) 100 MG capsule Take 2 capsules by mouth twice daily      . filgrastim (NEUPOGEN) 480 MCG/0.8ML SOLN injection Inject 0.8 mLs (480 mcg total) into the skin as directed.  4 Syringe  1  . HUMALOG 100 UNIT/ML injection Inject into the skin 3 (three) times daily before meals. Sliding scale      . insulin glargine (LANTUS) 100 UNIT/ML injection Inject 64 Units into the skin at bedtime.       . mineral oil external liquid Apply 1 application topically every other day. Every other day apply to stomach      . oxycodone (OXY-IR) 5 MG capsule Take 5 mg by mouth every 4 (four) hours as needed.      Marland Kitchen oxyCODONE (OXYCONTIN) 10 MG 12 hr tablet Take 1 tablet (10 mg total) by mouth every 12 (twelve) hours.  60 tablet  0  . POMALYST 4 MG capsule Take 4 mg by mouth daily.       . simvastatin (ZOCOR) 20 MG tablet Take 20 mg by mouth every evening.      . TRAVATAN Z 0.004 % SOLN ophthalmic solution Place 1 drop into both eyes at bedtime.      . valACYclovir (VALTREX) 500 MG tablet Take 1 tablet (500 mg total) by mouth 2 (two) times daily.  14 tablet  1   Scheduled: . amLODipine  5 mg Oral Daily  . aspirin EC  81 mg Oral Daily  . atenolol  25 mg Oral Daily  . bisacodyl  15 mg Oral QODAY  . docusate sodium  100 mg Oral BID  . enoxaparin (LOVENOX) injection  40 mg Subcutaneous Q24H  . feeding  supplement  237 mL Oral TID WC  . imipenem-cilastatin  500 mg Intravenous Q8H  . insulin glargine  20 Units Subcutaneous QHS  . levETIRAcetam  250 mg Oral BID  . mineral oil  10 mL Other QODAY  . OxyCODONE  10 mg Oral BID  . simvastatin  20 mg Oral QPM  . sodium chloride  3 mL Intravenous Q12H  . Travoprost (BAK Free)  1 drop Both Eyes QHS  . vancomycin  750 mg Intravenous Q12H   Continuous: . sodium chloride 50 mL/hr (09/15/12 1300)    Assessment/Plan: Fever, mental status change in a patient with multiple myeloma with recent hospitalization for Pseudomonas in June. Readmitted with the above chief complaint, treated with cefepime and Vanco without improvement. Recently started on ganciclovir without improvement. Now back on vancomycin and imipenem. Continued antibiotics per ID , palliative care input appreciated. Relapsed myeloma recently required transfusion, hematology asked to revisit to give input  Chronic kidney disease renal function at baseline Hypertension , BP went up yesterday, requiring IV meds time one. Diabetes   LOS: 10 days   Rodney Green 09/16/2012, 9:51 AM

## 2012-09-16 NOTE — Progress Notes (Signed)
Put daughter's fmla form on nurse's desk. °

## 2012-09-16 NOTE — Progress Notes (Signed)
Subjective: The patient is seen and examined today. He is less confused today. He denied having any significant fever or chills. He denied having any cough, shortness breath or hemoptysis. The patient denied having any significant nausea or vomiting  Objective: Vital signs in last 24 hours: Temp:  [97.9 F (36.6 C)-100.6 F (38.1 C)] 100.6 F (38.1 C) (07/24 2217) Pulse Rate:  [94-100] 100 (07/24 2217) Resp:  [18-20] 18 (07/24 2217) BP: (155-186)/(89-110) 186/110 mmHg (07/24 2217) SpO2:  [92 %-93 %] 92 % (07/24 2217)  Intake/Output from previous day: 07/24 0701 - 07/25 0700 In: 775 [I.V.:275; IV Piggyback:500] Out: -  Intake/Output this shift:    General appearance: alert, cooperative and no distress Resp: clear to auscultation bilaterally Cardio: regular rate and rhythm, S1, S2 normal, no murmur, click, rub or gallop GI: soft, non-tender; bowel sounds normal; no masses,  no organomegaly Extremities: extremities normal, atraumatic, no cyanosis or edema  Lab Results:   Recent Labs  09/13/12 1648 09/14/12 0520 09/14/12 0650  WBC 3.8*  --   --   HGB 6.5* 7.9* 8.4*  HCT 21.5* 24.9* 26.1*  PLT 326  --   --    BMET  Recent Labs  09/13/12 0500 09/14/12 0520  NA  --  133*  K  --  4.3  CL  --  101  CO2  --  23  GLUCOSE  --  236*  BUN  --  23  CREATININE 1.74* 1.56*  CALCIUM  --  9.8    Studies/Results: Dg Chest 2 View  09/15/2012   *RADIOLOGY REPORT*  Clinical Data: Recent pneumonia.  CHEST - 2 VIEW  Comparison: 09/09/2012 and 09/06/2012  Findings: There is better lung expansion on the current study than on the prior exams.  There is central bronchovascular prominence, but no convincing infiltrate or pulmonary edema.  There is no pleural effusion or pneumothorax.  Multiple compression fractures are noted along the lower thoracic and upper lumbar spine, stable.  IMPRESSION: No convincing residual pneumonia.   Original Report Authenticated By: Amie Portland, M.D.     Medications: I have reviewed the patient's current medications.  Assessment/Plan: This is a very pleasant 67 years old [place] American male with progressive multiple myeloma with significant increase in his IgA compared to the previous myeloma panel. The patient was admitted with intermittent fever but blood cultures as well as CMV are negative. The etiology of his fever is unknown at this point but could be also secondary to polypharmacy or tumor fever.  Consider discontinuing any unnecessary medications. The patient feels much better today. I would consider him for treatment with Carfilzomib, Cytoxan and Decadron after discharge from the hospital, but palliative care is also a good option if the patient declines any further therapy. He may be ready for discharge to home if he stays for 24-hour with no fever. I will continue to follow up the patient with you and assist in his management as needed basis.    LOS: 10 days    Torrence Hammack K. 09/16/2012

## 2012-09-16 NOTE — Progress Notes (Signed)
INFECTIOUS DISEASE PROGRESS NOTE  ID: Darrian Grzelak Angelica is a 67 y.o. male with  Principal Problem:   Fever Active Problems:   Multiple myeloma in relapse   Hypertension   Stage III chronic kidney disease   Acute encephalopathy  Subjective: Per family confused, seeing spots, agitated.   Abtx:  Anti-infectives   Start     Dose/Rate Route Frequency Ordered Stop   09/15/12 2200  vancomycin (VANCOCIN) IVPB 750 mg/150 ml premix     750 mg 150 mL/hr over 60 Minutes Intravenous Every 12 hours 09/15/12 1103     09/15/12 1400  imipenem-cilastatin (PRIMAXIN) 500 mg in sodium chloride 0.9 % 100 mL IVPB     500 mg 200 mL/hr over 30 Minutes Intravenous 3 times per day 09/15/12 1038     09/15/12 1200  vancomycin (VANCOCIN) 1,500 mg in sodium chloride 0.9 % 500 mL IVPB     1,500 mg 250 mL/hr over 120 Minutes Intravenous  Once 09/15/12 1103 09/15/12 1511   09/10/12 1830  ganciclovir (CYTOVENE) 205 mg in sodium chloride 0.9 % 100 mL IVPB  Status:  Discontinued     2.5 mg/kg  81.7 kg 100 mL/hr over 60 Minutes Intravenous Every 24 hours 09/10/12 1744 09/15/12 1038   09/07/12 2200  vancomycin (VANCOCIN) 1,250 mg in sodium chloride 0.9 % 250 mL IVPB  Status:  Discontinued     1,250 mg 166.7 mL/hr over 90 Minutes Intravenous Daily at bedtime 09/07/12 0531 09/10/12 1754   09/07/12 1200  ceFEPIme (MAXIPIME) 2 g in dextrose 5 % 50 mL IVPB  Status:  Discontinued     2 g 100 mL/hr over 30 Minutes Intravenous Every 24 hours 09/07/12 0531 09/13/12 1541   09/07/12 0200  vancomycin (VANCOCIN) IVPB 1000 mg/200 mL premix     1,000 mg 200 mL/hr over 60 Minutes Intravenous  Once 09/07/12 0155 09/07/12 0350   09/07/12 0145  valACYclovir (VALTREX) tablet 500 mg  Status:  Discontinued     500 mg Oral 2 times daily 09/07/12 0141 09/10/12 1747   09/07/12 0015  ceFEPIme (MAXIPIME) 1 g in dextrose 5 % 50 mL IVPB     1 g 100 mL/hr over 30 Minutes Intravenous  Once 09/07/12 0010 09/07/12 0111      Medications:    Scheduled: . amLODipine  5 mg Oral Daily  . aspirin EC  81 mg Oral Daily  . atenolol  25 mg Oral Daily  . bisacodyl  15 mg Oral QODAY  . docusate sodium  100 mg Oral BID  . enoxaparin (LOVENOX) injection  40 mg Subcutaneous Q24H  . feeding supplement  237 mL Oral TID WC  . imipenem-cilastatin  500 mg Intravenous Q8H  . insulin glargine  20 Units Subcutaneous QHS  . levETIRAcetam  250 mg Oral BID  . mineral oil  10 mL Other QODAY  . OxyCODONE  10 mg Oral BID  . simvastatin  20 mg Oral QPM  . sodium chloride  3 mL Intravenous Q12H  . Travoprost (BAK Free)  1 drop Both Eyes QHS  . vancomycin  750 mg Intravenous Q12H    Objective: Vital signs in last 24 hours: Temp:  [98.1 F (36.7 C)-100.6 F (38.1 C)] 98.1 F (36.7 C) (07/25 1100) Pulse Rate:  [87-100] 87 (07/25 1100) Resp:  [16-18] 16 (07/25 1100) BP: (126-186)/(80-110) 169/90 mmHg (07/25 0554) SpO2:  [91 %-99 %] 99 % (07/25 1100)   General appearance: mild distress Resp: clear to auscultation bilaterally Cardio:  regular rate and rhythm GI: normal findings: bowel sounds normal and soft, non-tender Pt does not interact during exam.   Lab Results  Recent Labs  09/13/12 1648 09/14/12 0520 09/14/12 0650 09/16/12 0505  WBC 3.8*  --   --   --   HGB 6.5* 7.9* 8.4*  --   HCT 21.5* 24.9* 26.1*  --   NA  --  133*  --  135  K  --  4.3  --  3.7  CL  --  101  --  99  CO2  --  23  --  23  BUN  --  23  --  20  CREATININE  --  1.56*  --  1.53*   Liver Panel No results found for this basename: PROT, ALBUMIN, AST, ALT, ALKPHOS, BILITOT, BILIDIR, IBILI,  in the last 72 hours Sedimentation Rate No results found for this basename: ESRSEDRATE,  in the last 72 hours C-Reactive Protein No results found for this basename: CRP,  in the last 72 hours  Microbiology: Recent Results (from the past 240 hour(s))  URINE CULTURE     Status: None   Collection Time    09/06/12 10:23 PM      Result Value Range Status   Specimen  Description URINE, CLEAN CATCH   Final   Special Requests NONE   Final   Culture  Setup Time 09/07/2012 06:33   Final   Colony Count NO GROWTH   Final   Culture NO GROWTH   Final   Report Status 09/08/2012 FINAL   Final  CULTURE, BLOOD (ROUTINE X 2)     Status: None   Collection Time    09/07/12 12:40 AM      Result Value Range Status   Specimen Description BLOOD LEFT ARM   Final   Special Requests BOTTLES DRAWN AEROBIC AND ANAEROBIC    Final   Culture  Setup Time 09/07/2012 06:31   Final   Culture NO GROWTH 5 DAYS   Final   Report Status 09/13/2012 FINAL   Final  CULTURE, BLOOD (ROUTINE X 2)     Status: None   Collection Time    09/07/12 12:45 AM      Result Value Range Status   Specimen Description BLOOD LEFT HAND   Final   Special Requests BOTTLES DRAWN AEROBIC ONLY    Final   Culture  Setup Time 09/07/2012 06:32   Final   Culture NO GROWTH 5 DAYS   Final   Report Status 09/13/2012 FINAL   Final  CULTURE, BLOOD (ROUTINE X 2)     Status: None   Collection Time    09/10/12  7:15 PM      Result Value Range Status   Specimen Description BLOOD LEFT ARM   Final   Special Requests BOTTLES DRAWN AEROBIC AND ANAEROBIC    Final   Culture  Setup Time 09/11/2012 15:01   Final   Culture     Final   Value:        BLOOD CULTURE RECEIVED NO GROWTH TO DATE CULTURE WILL BE HELD FOR 5 DAYS BEFORE ISSUING A FINAL NEGATIVE REPORT   Report Status PENDING   Incomplete  CULTURE, BLOOD (ROUTINE X 2)     Status: None   Collection Time    09/10/12  7:25 PM      Result Value Range Status   Specimen Description BLOOD LEFT ARM   Final   Special Requests BOTTLES DRAWN  AEROBIC ONLY    Final   Culture  Setup Time 09/11/2012 15:01   Final   Culture     Final   Value:        BLOOD CULTURE RECEIVED NO GROWTH TO DATE CULTURE WILL BE HELD FOR 5 DAYS BEFORE ISSUING A FINAL NEGATIVE REPORT   Report Status PENDING   Incomplete  CULTURE, BLOOD (ROUTINE X 2)     Status: None   Collection Time     09/11/12  8:29 PM      Result Value Range Status   Specimen Description BLOOD LEFT ARM   Final   Special Requests BOTTLES DRAWN AEROBIC AND ANAEROBIC    Final   Culture  Setup Time 09/12/2012 02:33   Final   Culture     Final   Value:        BLOOD CULTURE RECEIVED NO GROWTH TO DATE CULTURE WILL BE HELD FOR 5 DAYS BEFORE ISSUING A FINAL NEGATIVE REPORT   Report Status PENDING   Incomplete  CULTURE, BLOOD (ROUTINE X 2)     Status: None   Collection Time    09/11/12  8:39 PM      Result Value Range Status   Specimen Description BLOOD LEFT HAND   Final   Special Requests BOTTLES DRAWN AEROBIC AND ANAEROBIC    Final   Culture  Setup Time 09/12/2012 02:33   Final   Culture     Final   Value:        BLOOD CULTURE RECEIVED NO GROWTH TO DATE CULTURE WILL BE HELD FOR 5 DAYS BEFORE ISSUING A FINAL NEGATIVE REPORT   Report Status PENDING   Incomplete  CULTURE, BLOOD (ROUTINE X 2)     Status: None   Collection Time    09/15/12 11:45 AM      Result Value Range Status   Specimen Description BLOOD LEFT WRIST   Final   Special Requests BOTTLES DRAWN AEROBIC ONLY 2CC   Final   Culture  Setup Time 09/15/2012 15:18   Final   Culture     Final   Value:        BLOOD CULTURE RECEIVED NO GROWTH TO DATE CULTURE WILL BE HELD FOR 5 DAYS BEFORE ISSUING A FINAL NEGATIVE REPORT   Report Status PENDING   Incomplete  CULTURE, BLOOD (ROUTINE X 2)     Status: None   Collection Time    09/15/12 11:50 AM      Result Value Range Status   Specimen Description BLOOD RIGHT ARM   Final   Special Requests BOTTLES DRAWN AEROBIC AND ANAEROBIC 4CC   Final   Culture  Setup Time 09/15/2012 15:18   Final   Culture     Final   Value:        BLOOD CULTURE RECEIVED NO GROWTH TO DATE CULTURE WILL BE HELD FOR 5 DAYS BEFORE ISSUING A FINAL NEGATIVE REPORT   Report Status PENDING   Incomplete    Studies/Results: Dg Chest 2 View  09/15/2012   *RADIOLOGY REPORT*  Clinical Data: Recent pneumonia.  CHEST - 2 VIEW   Comparison: 09/09/2012 and 09/06/2012  Findings: There is better lung expansion on the current study than on the prior exams.  There is central bronchovascular prominence, but no convincing infiltrate or pulmonary edema.  There is no pleural effusion or pneumothorax.  Multiple compression fractures are noted along the lower thoracic and upper lumbar spine, stable.  IMPRESSION: No convincing residual pneumonia.  Original Report Authenticated By: Amie Portland, M.D.     Assessment/Plan: Multiple Myeloma  Fever, Confusion  Sepsis  previous pseudomonas June  Previous CMV DNA+ blood, now negative CKD stage III  Diabetes   Mild temp last 24 h Cr slightly better  BCx are all negative/NGTD  His repeat CXR does not show infiltrate I suggested adding anti-fungal but family wanted to wait.  Family meeting later today.     Total days of antibiotics:  Vancomycin- 7/16 to 7/19             7/24 --> Imipenem- 7/24 ---> GCV off 24h Cefepime - 7/16 to 7/21            Johny Sax Infectious Diseases (pager) [phone] [email or web address] 09/16/2012, 1:28 PM  LOS: 10 days

## 2012-09-17 LAB — GLUCOSE, CAPILLARY: Glucose-Capillary: 228 mg/dL — ABNORMAL HIGH (ref 70–99)

## 2012-09-17 LAB — CULTURE, BLOOD (ROUTINE X 2)
Culture: NO GROWTH
Culture: NO GROWTH

## 2012-09-17 MED ORDER — LORAZEPAM 2 MG/ML IJ SOLN
0.2500 mg | Freq: Three times a day (TID) | INTRAMUSCULAR | Status: DC
  Administered 2012-09-17 – 2012-09-20 (×9): 0.25 mg via INTRAVENOUS
  Filled 2012-09-17 (×7): qty 1

## 2012-09-17 NOTE — Progress Notes (Signed)
Patient Rodney Green      DOB: 08/26/1945      QIO:[id number]   Palliative Medicine Team at [facility] Progress Note    Subjective: Reviewed notes form Dr. Earl Green.  [name] is more coherent today.  Nursing states he received his scheduled ativan over night but the morning doses were held because they thought it was contributing to his sedation.  Reality is he has turned the corner since starting the low dose scheduled ativan.  I educated staff on the use of the medication and importance.  In light of how well he is doing we could consider spreading the the low dose out wider to q8.  He contiues on Keppra orally which might be able to be up titrated.  Spent one hour with family starting the conversation about where we are heading with Rodney Green/Rodney Green's care.  He was able to weigh in some on the topic.  When asked if he would consider using Hospice to care for him , he expressed that he was not so sure he was ready for that .  Fevers are noted to be started to improve.  Will need to see how he does over the next 24 hours as family and patient process moving toward full comfort.      Filed Vitals:   09/17/12 1439  BP: 144/85  Pulse: 83  Temp: 98.7 F (37.1 C)  Resp: 19   Physical exam:  General: intermittently lucent, at times processes seemingly clearly at other times confabulates PERRL, EOMI, at time seems to have a left gaze preference.  Patient comes in and out of sleep.      Assessment and plan:66 yr old african [place] male with multiple myeloma admitted with fevers of unknown origin.  Patient was acutely delirious yesterday with complaint of seeing spots, and confusion.  Started scheduled ativan in addition to the already present keppra..  1.  DNR  2.  Possible Seizure activity: patient was started on low dose keppra last week for tremor, no with episodes yesterday of left gaze preference and agitation.  Opted to use a scheduled dose of ativan because of mixed symptoms.  Looks  better today. Might consider up titrating keppra and down titrating ativan if he still looks this way tomorrow.  For now will spread the dosing to q 8.  3.  Fevers: have trended downward on current antibiotics.  Will talk with ID on Monday about next steps depending on how he looks.  4.  Cautious eating for comfort.Aspiration precautions.  Started a conversation with extended family about possible full comfort with consideration for residential hospice placement.    Total time:  500 pm 628 pm  Rodney Green L. Ladona Ridgel, MD MBA The Palliative Medicine Team at [facility] Phone: [phone] Pager: [phone]

## 2012-09-17 NOTE — Progress Notes (Signed)
Assessment/Plan: Principal Problem:   Fever - cont abx. Noted discussion of Dr. Ladona Ridgel with family. Continue current therapy.  Active Problems:   Multiple myeloma in relapse   DM (diabetes mellitus), type 2 with renal complications   Hypertension   CKD (chronic kidney disease) stage 3, GFR 30-59 ml/min   Stage III chronic kidney disease   Acute encephalopathy - calmer today with regular lorazepam. Still "seeing spots."   Subjective: Patient awake and alert. Stated, "Hi Doc, I haven't seen you in a long time," which is a true statement. I have cared for him in past hospitalizations.   Still seeing some spots at times, usually when he recognizes that he is confused.   Objective:  Vital Signs: Filed Vitals:   09/16/12 0554 09/16/12 1100 09/16/12 2200 09/17/12 0700  BP: 169/90  148/88 147/90  Pulse: 90 87 85 92  Temp: 98.1 F (36.7 C) 98.1 F (36.7 C) 99.3 F (37.4 C) 99.1 F (37.3 C)  TempSrc: Oral Oral Oral Oral  Resp: 16 16 20 16   Height:      Weight:      SpO2: 91% 99% 95% 93%     EXAM: awake, alert. Comfortable.    Intake/Output Summary (Last 24 hours) at 09/17/12 0943 Last data filed at 09/17/12 0711  Gross per 24 hour  Intake    240 ml  Output   1700 ml  Net  -1460 ml    Lab Results:  Recent Labs  09/16/12 0505  NA 135  K 3.7  CL 99  CO2 23  GLUCOSE 264*  BUN 20  CREATININE 1.53*  CALCIUM 9.7   No results found for this basename: AST, ALT, ALKPHOS, BILITOT, PROT, ALBUMIN,  in the last 72 hours No results found for this basename: LIPASE, AMYLASE,  in the last 72 hours No results found for this basename: WBC, NEUTROABS, HGB, HCT, MCV, PLT,  in the last 72 hours No results found for this basename: CKTOTAL, CKMB, CKMBINDEX, TROPONINI,  in the last 72 hours No components found with this basename: POCBNP,  No results found for this basename: DDIMER,  in the last 72 hours No results found for this basename: HGBA1C,  in the last 72 hours No results found  for this basename: CHOL, HDL, LDLCALC, TRIG, CHOLHDL, LDLDIRECT,  in the last 72 hours No results found for this basename: TSH, T4TOTAL, FREET3, T3FREE, THYROIDAB,  in the last 72 hours No results found for this basename: VITAMINB12, FOLATE, FERRITIN, TIBC, IRON, RETICCTPCT,  in the last 72 hours  Studies/Results: Dg Chest 2 View  09/15/2012   *RADIOLOGY REPORT*  Clinical Data: Recent pneumonia.  CHEST - 2 VIEW  Comparison: 09/09/2012 and 09/06/2012  Findings: There is better lung expansion on the current study than on the prior exams.  There is central bronchovascular prominence, but no convincing infiltrate or pulmonary edema.  There is no pleural effusion or pneumothorax.  Multiple compression fractures are noted along the lower thoracic and upper lumbar spine, stable.  IMPRESSION: No convincing residual pneumonia.   Original Report Authenticated By: Amie Portland, M.D.   Medications: Medications administered in the last 24 hours reviewed.  Current Medication List reviewed.    LOS: 11 days   [facility] Internal Medicine @ [facility] [phone]) 09/17/2012, 9:43 AM

## 2012-09-18 LAB — CULTURE, BLOOD (ROUTINE X 2): Culture: NO GROWTH

## 2012-09-18 LAB — GLUCOSE, CAPILLARY: Glucose-Capillary: 291 mg/dL — ABNORMAL HIGH (ref 70–99)

## 2012-09-18 MED ORDER — HYDRALAZINE HCL 20 MG/ML IJ SOLN
10.0000 mg | Freq: Once | INTRAMUSCULAR | Status: AC
  Administered 2012-09-18: 10 mg via INTRAVENOUS
  Filled 2012-09-18: qty 1

## 2012-09-18 MED ORDER — AMLODIPINE BESYLATE 10 MG PO TABS
10.0000 mg | ORAL_TABLET | Freq: Every day | ORAL | Status: DC
  Administered 2012-09-18 – 2012-09-20 (×3): 10 mg via ORAL
  Filled 2012-09-18 (×3): qty 1

## 2012-09-18 MED ORDER — VANCOMYCIN HCL IN DEXTROSE 1-5 GM/200ML-% IV SOLN
1000.0000 mg | INTRAVENOUS | Status: DC
  Administered 2012-09-19: 1000 mg via INTRAVENOUS
  Filled 2012-09-18 (×2): qty 200

## 2012-09-18 NOTE — Progress Notes (Signed)
Assessment/Plan: Principal Problem:   Fever - this is better. I am not quite sure what we are treating as no definite source. Now on day 3 of abx. Will continue same.  Active Problems:   Multiple myeloma in relapse   DM (diabetes mellitus), type 2 with renal complications   Hypertension - BP has been up some in past 36 hours. He is on his usual home meds for HTN. On no new meds that would raise BP and he is not agitated.  Will increase amlodipine to 10 mg daily.    CKD (chronic kidney disease) stage 3, GFR 30-59 ml/min   Stage III chronic kidney disease   Acute encephalopathy - improved but still some issues with MS. Dr. Lubertha Basque plan reviewed.    Subjective: Feels okay. He is a bit sleepier this morning but is coherent (last lorazepam was about two hours ago). No pain, dypsnea.   Objective:  Vital Signs: Filed Vitals:   09/17/12 2130 09/18/12 0204 09/18/12 0520 09/18/12 0545  BP: 162/100 163/99 173/93 162/98  Pulse:  97 88   Temp:  99.8 F (37.7 C) 98.3 F (36.8 C)   TempSrc:  Oral Oral   Resp:  20 20   Height:      Weight:      SpO2:  91% 92%      EXAM: Awake and coherent but not quite as alert as yesterday.    Intake/Output Summary (Last 24 hours) at 09/18/12 0846 Last data filed at 09/18/12 0500  Gross per 24 hour  Intake     60 ml  Output   2500 ml  Net  -2440 ml    Lab Results:  Recent Labs  09/16/12 0505  NA 135  K 3.7  CL 99  CO2 23  GLUCOSE 264*  BUN 20  CREATININE 1.53*  CALCIUM 9.7   No results found for this basename: AST, ALT, ALKPHOS, BILITOT, PROT, ALBUMIN,  in the last 72 hours No results found for this basename: LIPASE, AMYLASE,  in the last 72 hours No results found for this basename: WBC, NEUTROABS, HGB, HCT, MCV, PLT,  in the last 72 hours No results found for this basename: CKTOTAL, CKMB, CKMBINDEX, TROPONINI,  in the last 72 hours No components found with this basename: POCBNP,  No results found for this basename: DDIMER,  in the  last 72 hours No results found for this basename: HGBA1C,  in the last 72 hours No results found for this basename: CHOL, HDL, LDLCALC, TRIG, CHOLHDL, LDLDIRECT,  in the last 72 hours No results found for this basename: TSH, T4TOTAL, FREET3, T3FREE, THYROIDAB,  in the last 72 hours No results found for this basename: VITAMINB12, FOLATE, FERRITIN, TIBC, IRON, RETICCTPCT,  in the last 72 hours  Studies/Results: No results found. Medications: Medications administered in the last 24 hours reviewed.  Current Medication List reviewed.    LOS: 12 days   [facility] Internal Medicine @ [facility] [phone]) 09/18/2012, 8:46 AM

## 2012-09-18 NOTE — Progress Notes (Signed)
Patient [id number] D Romagnoli      DOB: 10-26-1945      Rodney Green   Palliative Medicine Team at [facility] Progress Note    Subjective: Rodney Green continues to come in and out of periods of sleep.  When he is out he is jovial and conversant.  Sometimes confabulates, other times is spot on.  He is not eating.  His spouse will be off work tomorrow so we will remeet to discuss next steps.  She is leaning to toward full comfort care with transition to Residential Hospice, [facility] is right up the street from their home.  His brothers are generally supportive of this.   Filed Vitals:   09/18/12 2205  BP: 150/88  Pulse: 89  Temp: 98.1 F (36.7 C)  Resp: 18   Physical exam:  Genreal: intermittently somnolent PERRL, EOMI, anicteric Chest decreased no rhonchi or rales CVS: regular rate and rhythm, S1 S2 present Ext: warm, trace to 1+ edema in all limbs Neuro: intermittently oriented to self and family but not time.  No insight into how sick he is.    No labs  Assessment and plan: 67 yr old african [place] male with history of multiple myeloma refractory to treatment admitted with fevers of unknown origin.  He has responded to broad spectrum antibiotics but source for infection remains undiagnosed.  Course complicated by delirium and possible seizures. Started on Keppra and scheduled ativan   1.  DNR/ DNI  2.  FUO: day 12 antibiotics,  Chronic antiviral therapy.  3.  Further discussions today on next steps.  Total time 15 min   Rodney Green L. Ladona Ridgel, MD MBA The Palliative Medicine Team at [facility] Phone: [phone] Pager: [phone]

## 2012-09-18 NOTE — Progress Notes (Signed)
ANTIBIOTIC CONSULT NOTE - Follow up  Pharmacy Consult for Vancomycin (Imipenem per MD) Indication: FUO  Allergies  Allergen Reactions  . Ace Inhibitors     He is not sure about allergy to ace inhibitors    Patient Measurements: Height: 5' 5.5" (166.4 cm) Weight: 180 lb 1.9 oz (81.7 kg) IBW/kg (Calculated) : 62.65    Vital Signs: Temp: 98.3 F (36.8 C) (07/27 0520) Temp src: Oral (07/27 0520) BP: 162/98 mmHg (07/27 0545) Pulse Rate: 88 (07/27 0520) Intake/Output from previous day: 07/26 0701 - 07/27 0700 In: 60 [P.O.:60] Out: 3400 [Urine:3400] Intake/Output from this shift: Total I/O In: 80 [P.O.:80] Out: -   Labs:  Recent Labs  09/16/12 0505  CREATININE 1.53*   Estimated Creatinine Clearance: 48.5 ml/min (by C-G formula based on Cr of 1.53).  Recent Labs  09/18/12 0930  VANCOTROUGH 24.6*     Microbiology: Recent Results (from the past 720 hour(s))  URINE CULTURE     Status: None   Collection Time    08/30/12 10:36 AM      Result Value Range Status   Colony Count 5,000 COLONIES/ML   Final   Organism ID, Bacteria Insignificant Growth   Final  URINE CULTURE     Status: None   Collection Time    09/06/12 10:23 PM      Result Value Range Status   Specimen Description URINE, CLEAN CATCH   Final   Special Requests NONE   Final   Culture  Setup Time 09/07/2012 06:33   Final   Colony Count NO GROWTH   Final   Culture NO GROWTH   Final   Report Status 09/08/2012 FINAL   Final  CULTURE, BLOOD (ROUTINE X 2)     Status: None   Collection Time    09/07/12 12:40 AM      Result Value Range Status   Specimen Description BLOOD LEFT ARM   Final   Special Requests BOTTLES DRAWN AEROBIC AND ANAEROBIC    Final   Culture  Setup Time 09/07/2012 06:31   Final   Culture NO GROWTH 5 DAYS   Final   Report Status 09/13/2012 FINAL   Final  CULTURE, BLOOD (ROUTINE X 2)     Status: None   Collection Time    09/07/12 12:45 AM      Result Value Range Status   Specimen  Description BLOOD LEFT HAND   Final   Special Requests BOTTLES DRAWN AEROBIC ONLY    Final   Culture  Setup Time 09/07/2012 06:32   Final   Culture NO GROWTH 5 DAYS   Final   Report Status 09/13/2012 FINAL   Final  CULTURE, BLOOD (ROUTINE X 2)     Status: None   Collection Time    09/10/12  7:15 PM      Result Value Range Status   Specimen Description BLOOD LEFT ARM   Final   Special Requests BOTTLES DRAWN AEROBIC AND ANAEROBIC    Final   Culture  Setup Time 09/11/2012 15:01   Final   Culture NO GROWTH 5 DAYS   Final   Report Status 09/17/2012 FINAL   Final  CULTURE, BLOOD (ROUTINE X 2)     Status: None   Collection Time    09/10/12  7:25 PM      Result Value Range Status   Specimen Description BLOOD LEFT ARM   Final   Special Requests BOTTLES DRAWN AEROBIC ONLY   Final   Culture  Setup Time 09/11/2012 15:01   Final   Culture NO GROWTH 5 DAYS   Final   Report Status 09/17/2012 FINAL   Final  CULTURE, BLOOD (ROUTINE X 2)     Status: None   Collection Time    09/11/12  8:29 PM      Result Value Range Status   Specimen Description BLOOD LEFT ARM   Final   Special Requests BOTTLES DRAWN AEROBIC AND ANAEROBIC    Final   Culture  Setup Time 09/12/2012 02:33   Final   Culture     Final   Value:        BLOOD CULTURE RECEIVED NO GROWTH TO DATE CULTURE WILL BE HELD FOR 5 DAYS BEFORE ISSUING A FINAL NEGATIVE REPORT   Report Status PENDING   Incomplete  CULTURE, BLOOD (ROUTINE X 2)     Status: None   Collection Time    09/11/12  8:39 PM      Result Value Range Status   Specimen Description BLOOD LEFT HAND   Final   Special Requests BOTTLES DRAWN AEROBIC AND ANAEROBIC    Final   Culture  Setup Time 09/12/2012 02:33   Final   Culture     Final   Value:        BLOOD CULTURE RECEIVED NO GROWTH TO DATE CULTURE WILL BE HELD FOR 5 DAYS BEFORE ISSUING A FINAL NEGATIVE REPORT   Report Status PENDING   Incomplete  CULTURE, BLOOD (ROUTINE X 2)     Status: None   Collection  Time    09/15/12 11:45 AM      Result Value Range Status   Specimen Description BLOOD LEFT WRIST   Final   Special Requests BOTTLES DRAWN AEROBIC ONLY 2CC   Final   Culture  Setup Time 09/15/2012 15:18   Final   Culture     Final   Value:        BLOOD CULTURE RECEIVED NO GROWTH TO DATE CULTURE WILL BE HELD FOR 5 DAYS BEFORE ISSUING A FINAL NEGATIVE REPORT   Report Status PENDING   Incomplete  CULTURE, BLOOD (ROUTINE X 2)     Status: None   Collection Time    09/15/12 11:Rodney AM      Result Value Range Status   Specimen Description BLOOD RIGHT ARM   Final   Special Requests BOTTLES DRAWN AEROBIC AND ANAEROBIC 4CC   Final   Culture  Setup Time 09/15/2012 15:18   Final   Culture     Final   Value:        BLOOD CULTURE RECEIVED NO GROWTH TO DATE CULTURE WILL BE HELD FOR 5 DAYS BEFORE ISSUING A FINAL NEGATIVE REPORT   Report Status PENDING   Incomplete   Assessment: Rodney Green w/ MM, admitted 7/15 with fever and confusion. Recent admission for septic shock and respiratory failure secondary Pneumonia from pseudomonas parainfluenza. 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  He continues to have fevers and ID has change antimicrobials to vancomycin/imipenem  7/16 >> vanc >> 7/19, resume 7/24 >> 7/16 >> cefepime>> 7/22 7/19 >> Ganciclovir (ID MD) >> 7/24 7/24 >> imipenem (ID MD) >>  Tmax: AF WBCs: down to 3.8(7/22) Renal: SCr improved to 1.53(7/25), 49CG & N  6/22 Blood: CMV DNA dectected 6/23 BAL: no legionella isolated, No AFB, pseudomonas 300 col/ml (sens to cefepime) 6/23 Resp viral panel (BAL): parainfluenza 3 detected 7/8 Urine:  insig growth 7/15 Urine: NG F 7/16 blood x2: NG F 7/19 blood x 2: NG F 7/20 Blood x 2: NGTD 7/24: CMV DNA quant: < 200 copies detected, but although this result was below our lower limit of quantitation, CMV was detected. 7/24 Blood x2: NGTD  Vanc trough = 24.64ml/l, confirmed sample was collected prior to start of Vanc infusion.  Goal  of Therapy:  Vancomycin trough level 15-20 mcg/ml  Plan:   Change Vanc from 750mg  IV q12h to 1g IV q24h starting 24hrs after last dose. Agree with Primaxin 500mg  IV q8h. Measure Vanc trough at steady state. Follow up renal fxn and culture results.  Charolotte Eke, PharmD, pager [phone]. 09/18/2012,11:25 AM.

## 2012-09-19 ENCOUNTER — Encounter: Payer: Self-pay | Admitting: Internal Medicine

## 2012-09-19 LAB — GLUCOSE, CAPILLARY
Glucose-Capillary: 161 mg/dL — ABNORMAL HIGH (ref 70–99)
Glucose-Capillary: 197 mg/dL — ABNORMAL HIGH (ref 70–99)

## 2012-09-19 LAB — BASIC METABOLIC PANEL
BUN: 19 mg/dL (ref 6–23)
Chloride: 96 mEq/L (ref 96–112)
Glucose, Bld: 242 mg/dL — ABNORMAL HIGH (ref 70–99)
Potassium: 3.3 mEq/L — ABNORMAL LOW (ref 3.5–5.1)

## 2012-09-19 NOTE — Progress Notes (Signed)
Patient's family in room, patient urine is dark colored, breathing appears labored at times then relaxes and breathes easier.  Did not want lunch.  Family did speak with Dr. Ladona Ridgel and [place], CSW.

## 2012-09-19 NOTE — Progress Notes (Signed)
Subjective: Patient looks tired, still with confusion. Still with some low-grade temperature Advanced noted over the weekend in regards to discussions about comfort Palliative and Care input greatly appreciated  Objective: Vital signs in last 24 hours: Temp:  [98.1 F (36.7 C)-99.2 F (37.3 C)] 99.2 F (37.3 C) (07/28 0606) Pulse Rate:  [87-99] 99 (07/28 0606) Resp:  [16-18] 18 (07/28 0606) BP: (127-158)/(83-92) 158/92 mmHg (07/28 0606) SpO2:  [92 %-98 %] 92 % (07/28 0606) Weight change:  Last BM Date: 09/16/12  Intake/Output from previous day: 07/27 0701 - 07/28 0700 In: 200 [P.O.:200] Out: 1700 [Urine:1700] Intake/Output this shift:    General appearance: fatigued Resp: clear to auscultation bilaterally Cardio: regular rate and rhythm, S1, S2 normal, no murmur, click, rub or gallop Extremities: extremities normal, atraumatic, no cyanosis or edema  Lab Results:  Results for orders placed during the hospital encounter of 09/06/12 (from the past 24 hour(s))  VANCOMYCIN, TROUGH     Status: Abnormal   Collection Time    09/18/12  9:30 AM      Result Value Range   Vancomycin Tr 24.6 (*) 10.0 - 20.0 ug/mL  GLUCOSE, CAPILLARY     Status: Abnormal   Collection Time    09/18/12 11:40 AM      Result Value Range   Glucose-Capillary 178 (*) 70 - 99 mg/dL  GLUCOSE, CAPILLARY     Status: Abnormal   Collection Time    09/18/12  5:04 PM      Result Value Range   Glucose-Capillary 215 (*) 70 - 99 mg/dL  GLUCOSE, CAPILLARY     Status: Abnormal   Collection Time    09/18/12  9:57 PM      Result Value Range   Glucose-Capillary 291 (*) 70 - 99 mg/dL  BASIC METABOLIC PANEL     Status: Abnormal   Collection Time    09/19/12  4:36 AM      Result Value Range   Sodium 133 (*) 135 - 145 mEq/L   Potassium 3.3 (*) 3.5 - 5.1 mEq/L   Chloride 96  96 - 112 mEq/L   CO2 25  19 - 32 mEq/L   Glucose, Bld 242 (*) 70 - 99 mg/dL   BUN 19  6 - 23 mg/dL   Creatinine, Ser 8.65 (*) 0.50 - 1.35  mg/dL   Calcium 9.7  8.4 - 78.4 mg/dL   GFR calc non Af Amer 51 (*) >90 mL/min   GFR calc Af Amer 59 (*) >90 mL/min  GLUCOSE, CAPILLARY     Status: Abnormal   Collection Time    09/19/12  7:46 AM      Result Value Range   Glucose-Capillary 197 (*) 70 - 99 mg/dL   Comment 1 Notify RN        Studies/Results: No results found.  Medications:  Scheduled: . amLODipine  10 mg Oral Daily  . aspirin EC  81 mg Oral Daily  . atenolol  25 mg Oral Daily  . bisacodyl  15 mg Oral QODAY  . docusate sodium  100 mg Oral BID  . enoxaparin (LOVENOX) injection  40 mg Subcutaneous Q24H  . feeding supplement  237 mL Oral TID WC  . imipenem-cilastatin  500 mg Intravenous Q8H  . insulin aspart  0-9 Units Subcutaneous TID WC  . insulin glargine  10 Units Subcutaneous QHS  . levETIRAcetam  250 mg Oral BID  . LORazepam  0.25 mg Intravenous Q8H  . mineral oil  10 mL  Other QODAY  . OxyCODONE  10 mg Oral BID  . simvastatin  20 mg Oral QPM  . sodium chloride  3 mL Intravenous Q12H  . Travoprost (BAK Free)  1 drop Both Eyes QHS  . vancomycin  1,000 mg Intravenous Q24H   Continuous: . sodium chloride 50 mL/hr at 09/18/12 1558    Assessment/Plan: Fever, mental status change in immunocompromise patient with relapsed multiple myeloma. Patient continues with antibiotics for unknowns source. Over the weekend discussions have taken place in regards to comfort care, await further input from palliative care, for now antibiotics per infectious disease Diabetes Chronic kidney disease stage III Hypertension Mild hypokalemia   LOS: 13 days   Miray Mancino D 09/19/2012, 9:10 AM

## 2012-09-19 NOTE — Clinical Social Work Psychosocial (Signed)
     Clinical Social Work Department BRIEF PSYCHOSOCIAL ASSESSMENT 09/19/2012  Patient:  SHULEM, MADER     Account Number:  [id number]     Admit date:  09/06/2012  Clinical Social Worker:  Hattie Perch  Date/Time:  09/19/2012 12:00 M  Referred by:  Physician  Date Referred:  09/19/2012 Referred for  Residential hospice placement   Other Referral:   Interview type:  Family Other interview type:    PSYCHOSOCIAL DATA Living Status:  FAMILY Admitted from facility:   Level of care:   Primary support name:  Hope Bruns Primary support relationship to patient:  SPOUSE Degree of support available:   good    CURRENT CONCERNS Current Concerns  Post-Acute Placement   Other Concerns:    SOCIAL WORK ASSESSMENT / PLAN CSW met with family at bedside. patient is not able to really participate in assessment. patient's spouse is now agreeable to residential hospice placement. she is interested in [facility] but would like to tour it. CSW explained bed availability and that should [facility] not [facility] an option, then other facilities may need to be explored. spouse is understanding of same.   Assessment/plan status:   Other assessment/ plan:   Information/referral to community resources:    PATIENTS/FAMILYS RESPONSE TO PLAN OF CARE: patient's spouse was understandably upste at the fact that patient needs residential hospice placement. she is hopeful that [facility] will be an option as she has heard good things about it.

## 2012-09-19 NOTE — Progress Notes (Signed)
Faxed daughter's fmla forms to Brenda Jones @ [phone]. °

## 2012-09-19 NOTE — Progress Notes (Signed)
Patient Rodney Green      DOB: 1946-02-17      VWU:[id number]   Palliative Medicine Team at [facility] Progress Note    Subjective:Met with spouse and brother Rodney Green continues to be confuse and not eating. Family would lilke to precede with transition to Residential hospice.  Discussed case with ID . Discontinuation of antibiotics makes sense.    Filed Vitals:   09/19/12 0606  BP: 158/92  Pulse: 99  Temp: 99.2 F (37.3 C)  Resp: 18   Physical exam:   General: falls in and out of obtundation.  Confabulates when awake Chest decreased but clear CVS: regular , rate and rhythm Abd: soft, not tender Ext: warm, trace edema Neuro: oriented to self and family, no judgement or insight into current condition     Assessment and plan:66 yr old with refractory multiple myeloma admitted with fevers of unclear etiology. 13 days of abx still low grade and worsening confusion.  Family electing comfort care.  Consult for residential placement will be placed.  1. DNR  2. DC AbX  3. Agitation/possible focal seizures :would continue po keppra and scheduled ativan.   Prognosis: days to week  Diposition: residential hospice   Total time: 35 min   Golda Zavalza L. Ladona Ridgel, MD MBA The Palliative Medicine Team at [facility] Phone: [phone] Pager: [phone]

## 2012-09-19 NOTE — Progress Notes (Signed)
I reviewed pt's labs, vitals. I spoke with Dr Ladona Ridgel. He is being eval for [facility]. Agee with stopping antibiotics.  Available if questions.  Tinnie Gens. Alwyn Pea, MD (pager) [phone]

## 2012-09-20 LAB — GLUCOSE, CAPILLARY: Glucose-Capillary: 142 mg/dL — ABNORMAL HIGH (ref 70–99)

## 2012-09-20 MED ORDER — LORAZEPAM 0.5 MG PO TABS: 0.5000 mg | ORAL_TABLET | ORAL | Status: AC | PRN

## 2012-09-20 MED ORDER — ONDANSETRON HCL 4 MG PO TABS: 4.0000 mg | ORAL_TABLET | Freq: Four times a day (QID) | ORAL | Status: AC | PRN

## 2012-09-20 MED ORDER — ACETAMINOPHEN 325 MG PO TABS: 650.0000 mg | ORAL_TABLET | Freq: Four times a day (QID) | ORAL | Status: AC | PRN

## 2012-09-20 MED ORDER — INSULIN GLARGINE 100 UNIT/ML ~~LOC~~ SOLN: 10.0000 [IU] | Freq: Every day | SUBCUTANEOUS | Status: DC

## 2012-09-20 MED ORDER — ENSURE COMPLETE PO LIQD: 237.0000 mL | Freq: Three times a day (TID) | ORAL | Status: AC

## 2012-09-20 MED ORDER — LEVETIRACETAM 250 MG PO TABS: 250.0000 mg | ORAL_TABLET | Freq: Two times a day (BID) | ORAL | Status: AC

## 2012-09-20 NOTE — Progress Notes (Signed)
Patient cleared for discharge to [facility]. Packet copied and placed in [place]. ptar called for transportation. Met with patient's wife at bedside. She is agreeable to transfer and relieved that [facility] had bed availability.  Toriann Spadoni C. Ulla Mckiernan MSW, LCSW [phone]

## 2012-09-20 NOTE — Progress Notes (Signed)
Stopped to see patient today. He says he does not want a visit. Is being discharged to [facility].

## 2012-09-20 NOTE — Discharge Summary (Addendum)
Physician Discharge Summary  Patient ID: Rodney Green MRN: [id number] DOB/AGE: January 09, 1946 67 y.o.  Admit date: 09/06/2012 Discharge date: 09/20/2012  Admission Diagnoses:  Discharge Diagnoses:  Principal Problem:   Fever Active Problems:   Multiple myeloma in relapse   DM (diabetes mellitus), type 2 with renal complications   Hypertension   CKD (chronic kidney disease) stage 3, GFR 30-59 ml/min   Stage III chronic kidney disease   Acute encephalopathy   Discharged Condition: critical  Hospital Course:  Patient was admitted to the hospital with fever confusion. He has a history of relapsed multiple myeloma. Recently he was   admitted to the hospital with respiratory failure secondary to pseudomonas pneumonia. Patient was admitted to a medical floor bed, he was started on broad-spectrum antibiotics, vancomycin and cefepime. Patient persisted to have low-grade fever and periods of confusion. He had an MRI which showed a a lesion related to his myeloma however it was not felt to be symptomatic. Patient was seen by hematology oncology, radiation oncology, infectious disease and ultimately palliative care medicine. In the interim patient was given a trial of antiviral medication, ganciclovir however PCR for CMV was negative. The patient essentially had a progressive decline, occasional tremors, confusion, poor by mouth intake. Patient's wife requested palliative care, medications for comfort were provided in the form of analgesia, ativan with some improvement in his fluctuating levels of confusion. Ultimately antibiotics were stopped as there were no signs of improvement in his fevers. Antifungal agents were offered however patient's wife declined further aggressive treatment. There were several meetings with patient's family by palliative care and ultimately there was a decision to pursue full comfort care.    Consults: Treatment Team:  Si Gaul, MD Palliative  [facility]  Significant Diagnostic Studies:Dg Chest 2 View  09/15/2012   *RADIOLOGY REPORT*  Clinical Data: Recent pneumonia.  CHEST - 2 VIEW  Comparison: 09/09/2012 and 09/06/2012  Findings: There is better lung expansion on the current study than on the prior exams.  There is central bronchovascular prominence, but no convincing infiltrate or pulmonary edema.  There is no pleural effusion or pneumothorax.  Multiple compression fractures are noted along the lower thoracic and upper lumbar spine, stable.  IMPRESSION: No convincing residual pneumonia.   Original Report Authenticated By: Amie Portland, M.D.   Dg Chest 2 View  09/09/2012   *RADIOLOGY REPORT*  Clinical Data: Fever.  Pneumonia.  CHEST - 2 VIEW  Comparison: 09/06/2012.  Findings: Markedly low lung volumes.  Additionally, there is apical lordotic projection.  Low volumes accentuate the cardiopericardial silhouette and pulmonary vasculature.  No gross lobar consolidation.  IMPRESSION: Markedly low volume chest.  Consider repeat PA and lateral when patient can perform study with full inspiration.   Original Report Authenticated By: Andreas Newport, M.D.   Dg Chest 2 View  09/06/2012   *RADIOLOGY REPORT*  Clinical Data: Fever.  Recent pneumonia.  CHEST - 2 VIEW  Comparison: 08/30/2012.  Findings: Compression fractures of multiple lower thoracic and upper lumbar vertebrae, causing exaggerated kyphosis.  Status post bone augmentation in the upper lumbar spine, likely L1 and L2.  No appreciable change from prior, with sensitivity decreased by advanced osteopenia.  Low lung volumes with interstitial crowding.  No definitive lobar consolidation, effusion, pneumothorax.  No change in mediastinal size contours.  IMPRESSION: No definite change from prior to suggest acute cardiopulmonary disease.  Very low lung volumes decreases sensitivity for detecting pneumonia.   Original Report Authenticated By: Tiburcio Pea   Dg Chest 2  View  08/30/2012   *RADIOLOGY  REPORT*  Clinical Data: Fever.  Hypertension.  CHEST - 2 VIEW  Comparison: 08/21/2012  Findings: Lung volumes are low.  Allowing for this, the lungs are clear.  The heart, mediastinum and hila are unremarkable.  The bony thorax is demineralized.  There are multiple thoracic compression fractures that are stable.  IMPRESSION: No acute cardiopulmonary disease.   Original Report Authenticated By: Amie Portland, M.D.   Dg Chest 2 View  08/21/2012   *RADIOLOGY REPORT*  Clinical Data: Multiple myeloma.  Follow up of pseudomonas pneumonia, CHF.  CHEST - 2 VIEW  Comparison: 07/25/2012.  Findings: The interstitium has cleared.  No edema.  No focal airspace abnormalities.  The heart, mediastinum and hilar contours remain normal.  There are no effusions or pneumothoraces.  No change in position of left IJ central catheter.  No bony abnormalities.  IMPRESSION: Interval clearing of the interstitium.  No focal abnormalities.   Original Report Authenticated By: Sander Radon, M.D.   Mr Brain Wo Contrast  09/07/2012   *RADIOLOGY REPORT*  Clinical Data: Encephalopathy.  Myeloma.  Renal disease.  Diabetes.  MRI HEAD WITHOUT CONTRAST  Technique:  Multiplanar, multiecho pulse sequences of the brain and surrounding structures were obtained according to standard protocol without intravenous contrast.  Comparison: CT head 01/27/2012.  Findings: Widespread lytic lesions throughout the skull are redemonstrated, and increased compared with prior CT head.  The largest lesion is in the left occipital bone, cross section 10 x 27 mm, which has invaded the intracranial compartment but does not significantly compress the brain.  No acute stroke or hemorrhage.  No intra-axial mass lesion or hydrocephalus.  Premature cerebral and cerebellar atrophy.  Slight white matter disease.  No chronic hemorrhage.  Flow voids are maintained in the major intracranial vessels.  Normal pituitary and cerebellar tonsils. Within limits of evaluation on  noncontrast MR, no visible venous sinus occlusion.  Heterogeneous upper cervical bone marrow reflecting myeloma  with spondylosis at C3-4 mildly compressing the cord.  Chronic sinusitis.  No mastoid fluid.  Negative orbits.  IMPRESSION: Premature cerebral and cerebellar atrophy.  No acute stroke, hemorrhage, for intracranial inflammatory process.  Progression of osseous myeloma changes in the skull, with a moderate sized deposit left occipital bone with early intracranial extension.  Continued surveillance warranted.   Original Report Authenticated By: Davonna Belling, M.D.      Discharge Exam: Blood pressure 140/89, pulse 91, temperature 97.3 F (36.3 C), temperature source Axillary, resp. rate 16, height 5' 5.5" (1.664 m), weight 81.7 kg (180 lb 1.9 oz), SpO2 100.00%. General appearance: patient appears fatigued, tired Resp: clear to auscultation bilaterally Cardio: regular rate and rhythm, S1, S2 normal, no murmur, click, rub or gallop Extremities: extremities normal, atraumatic, no cyanosis or edema  Disposition:   Nurse, mental health Provider Department Dept Phone   09/21/2012 2:00 PM Si Gaul, MD [facility] [phone]   09/22/2012 2:15 PM Kalman Shan, MD [facility] [phone]       Medication List    STOP taking these medications       amLODipine 5 MG tablet  Commonly known as:  NORVASC     aspirin 81 MG tablet     atenolol 25 MG tablet  Commonly known as:  TENORMIN     dexamethasone 4 MG tablet  Commonly known as:  DECADRON     diphenhydrAMINE 25 MG tablet  Commonly known as:  SOMINEX  docusate sodium 100 MG capsule  Commonly known as:  COLACE     filgrastim 480 MCG/0.8ML Soln injection  Commonly known as:  NEUPOGEN     HUMALOG 100 UNIT/ML injection  Generic drug:  insulin lispro     insulin glargine 100 UNIT/ML injection  Commonly known as:  LANTUS     mineral oil external liquid      POMALYST 4 MG capsule  Generic drug:  pomalidomide     simvastatin 20 MG tablet  Commonly known as:  ZOCOR     TRAVATAN Z 0.004 % Soln ophthalmic solution  Generic drug:  Travoprost (BAK Free)     valACYclovir 500 MG tablet  Commonly known as:  VALTREX      TAKE these medications       acetaminophen 325 MG tablet  Commonly known as:  TYLENOL  Take 2 tablets (650 mg total) by mouth every 6 (six) hours as needed.     bisacodyl 5 MG EC tablet  Generic drug:  bisacodyl  Take 15 mg by mouth every other day.     feeding supplement Liqd  Take 237 mLs by mouth 3 (three) times daily with meals.     levETIRAcetam 250 MG tablet  Commonly known as:  KEPPRA  Take 1 tablet (250 mg total) by mouth 2 (two) times daily.     LORazepam 0.5 MG tablet  Commonly known as:  ATIVAN  Take 1 tablet (0.5 mg total) by mouth every 2 (two) hours as needed for anxiety.     ondansetron 4 MG tablet  Commonly known as:  ZOFRAN  Take 1 tablet (4 mg total) by mouth every 6 (six) hours as needed for nausea.     oxycodone 5 MG capsule  Commonly known as:  OXY-IR  Take 5 mg by mouth every 4 (four) hours as needed.     oxyCODONE 10 MG 12 hr tablet  Commonly known as:  OXYCONTIN  Take 1 tablet (10 mg total) by mouth every 12 (twelve) hours.       35 minutes were spent in the discharge process of the patient, medication reconciliation,discussing with family and nurses  Signed: Daymond Cordts D 09/20/2012, 9:28 AM

## 2012-09-20 NOTE — Consult Note (Signed)
[facility] Liaison: [facility] room available for Rodney Green today if transfer still makes sense. Met with spouse [name] yesterday. She is aware room available today. Insurance authorization pending. Plan to complete transfer paperwork with [facility] between 10 and 11. Will make CSW aware @ 8:30. Will need DC summary faxed to [phone] and RN to call report to [phone]. Thank you. Forrestine Him LCSW [phone]

## 2012-09-21 ENCOUNTER — Ambulatory Visit: Payer: Self-pay | Admitting: Internal Medicine

## 2012-09-21 LAB — CULTURE, BLOOD (ROUTINE X 2): Culture: NO GROWTH

## 2012-09-22 ENCOUNTER — Ambulatory Visit: Payer: Self-pay | Admitting: Internal Medicine

## 2012-09-28 LAB — AFB CULTURE WITH SMEAR (NOT AT ARMC)

## 2012-10-07 ENCOUNTER — Telehealth: Payer: Self-pay | Admitting: Dietician

## 2012-10-18 ENCOUNTER — Other Ambulatory Visit: Payer: Self-pay | Admitting: *Deleted

## 2012-10-20 ENCOUNTER — Telehealth: Payer: Self-pay | Admitting: Medical Oncology

## 2012-10-20 NOTE — Telephone Encounter (Signed)
Pt died aug 4th

## 2012-10-24 DEATH — deceased

## 2013-10-17 ENCOUNTER — Other Ambulatory Visit: Payer: Self-pay | Admitting: Pharmacist

## 2013-10-19 NOTE — Telephone Encounter (Signed)
Encounter was telephone call.
